# Patient Record
Sex: Female | Born: 1945 | Race: White | Hispanic: No | Marital: Married | Smoking: Former smoker
Health system: Southern US, Community
[De-identification: ages and names within clinical notes are randomized; demographics above are authoritative.]

## PROBLEM LIST (undated history)

## (undated) DIAGNOSIS — F32A Depression, unspecified: Secondary | ICD-10-CM

## (undated) DIAGNOSIS — M8580 Other specified disorders of bone density and structure, unspecified site: Secondary | ICD-10-CM

## (undated) DIAGNOSIS — K259 Gastric ulcer, unspecified as acute or chronic, without hemorrhage or perforation: Secondary | ICD-10-CM

## (undated) DIAGNOSIS — C44719 Basal cell carcinoma of skin of left lower limb, including hip: Secondary | ICD-10-CM

## (undated) DIAGNOSIS — K2270 Barrett's esophagus without dysplasia: Secondary | ICD-10-CM

## (undated) DIAGNOSIS — Z860100 Personal history of colon polyps, unspecified: Secondary | ICD-10-CM

## (undated) DIAGNOSIS — C4491 Basal cell carcinoma of skin, unspecified: Secondary | ICD-10-CM

## (undated) DIAGNOSIS — K219 Gastro-esophageal reflux disease without esophagitis: Secondary | ICD-10-CM

## (undated) DIAGNOSIS — C50919 Malignant neoplasm of unspecified site of unspecified female breast: Secondary | ICD-10-CM

## (undated) DIAGNOSIS — E785 Hyperlipidemia, unspecified: Secondary | ICD-10-CM

## (undated) DIAGNOSIS — J449 Chronic obstructive pulmonary disease, unspecified: Secondary | ICD-10-CM

## (undated) HISTORY — PX: EYE SURGERY: SHX253

## (undated) HISTORY — DX: Malignant neoplasm of unspecified site of unspecified female breast: C50.919

## (undated) HISTORY — PX: APPENDECTOMY: SHX54

## (undated) HISTORY — DX: Personal history of colon polyps, unspecified: Z86.0100

## (undated) HISTORY — DX: Gastric ulcer, unspecified as acute or chronic, without hemorrhage or perforation: K25.9

## (undated) HISTORY — DX: Barrett's esophagus without dysplasia: K22.70

## (undated) HISTORY — DX: Basal cell carcinoma of skin, unspecified: C44.91

## (undated) HISTORY — DX: Personal history of colonic polyps: Z86.010

## (undated) HISTORY — PX: CATARACT EXTRACTION, BILATERAL: SHX1313

## (undated) HISTORY — DX: Chronic obstructive pulmonary disease, unspecified: J44.9

## (undated) HISTORY — DX: Hyperlipidemia, unspecified: E78.5

## (undated) HISTORY — DX: Depression, unspecified: F32.A

## (undated) HISTORY — DX: Basal cell carcinoma of skin of left lower limb, including hip: C44.719

## (undated) HISTORY — DX: Gastro-esophageal reflux disease without esophagitis: K21.9

## (undated) HISTORY — PX: TONSILLECTOMY: SUR1361

## (undated) HISTORY — DX: Other specified disorders of bone density and structure, unspecified site: M85.80

---

## 1989-11-21 HISTORY — PX: ABDOMINAL HYSTERECTOMY: SHX81

## 1998-07-02 ENCOUNTER — Other Ambulatory Visit: Admission: RE | Admit: 1998-07-02 | Discharge: 1998-07-02 | Payer: Self-pay | Admitting: Obstetrics & Gynecology

## 1998-07-30 ENCOUNTER — Ambulatory Visit (HOSPITAL_COMMUNITY): Admission: RE | Admit: 1998-07-30 | Discharge: 1998-07-30 | Payer: Self-pay | Admitting: Obstetrics & Gynecology

## 1999-05-11 ENCOUNTER — Encounter (INDEPENDENT_AMBULATORY_CARE_PROVIDER_SITE_OTHER): Payer: Self-pay | Admitting: Specialist

## 1999-05-11 ENCOUNTER — Other Ambulatory Visit: Admission: RE | Admit: 1999-05-11 | Discharge: 1999-05-11 | Payer: Self-pay | Admitting: Obstetrics and Gynecology

## 1999-08-09 ENCOUNTER — Other Ambulatory Visit: Admission: RE | Admit: 1999-08-09 | Discharge: 1999-08-09 | Payer: Self-pay | Admitting: Obstetrics & Gynecology

## 2000-12-11 ENCOUNTER — Other Ambulatory Visit: Admission: RE | Admit: 2000-12-11 | Discharge: 2000-12-11 | Payer: Self-pay | Admitting: *Deleted

## 2001-05-02 ENCOUNTER — Encounter: Admission: RE | Admit: 2001-05-02 | Discharge: 2001-05-02 | Payer: Self-pay | Admitting: *Deleted

## 2001-05-02 ENCOUNTER — Encounter: Payer: Self-pay | Admitting: *Deleted

## 2001-05-18 ENCOUNTER — Encounter (INDEPENDENT_AMBULATORY_CARE_PROVIDER_SITE_OTHER): Payer: Self-pay | Admitting: Specialist

## 2001-05-18 ENCOUNTER — Ambulatory Visit (HOSPITAL_COMMUNITY): Admission: RE | Admit: 2001-05-18 | Discharge: 2001-05-18 | Payer: Self-pay | Admitting: *Deleted

## 2002-01-04 ENCOUNTER — Other Ambulatory Visit: Admission: RE | Admit: 2002-01-04 | Discharge: 2002-01-04 | Payer: Self-pay | Admitting: *Deleted

## 2004-01-28 ENCOUNTER — Other Ambulatory Visit: Admission: RE | Admit: 2004-01-28 | Discharge: 2004-01-28 | Payer: Self-pay | Admitting: Family Medicine

## 2006-07-22 HISTORY — PX: BREAST LUMPECTOMY: SHX2

## 2006-08-23 ENCOUNTER — Ambulatory Visit (HOSPITAL_BASED_OUTPATIENT_CLINIC_OR_DEPARTMENT_OTHER): Admission: RE | Admit: 2006-08-23 | Discharge: 2006-08-23 | Payer: Self-pay | Admitting: General Surgery

## 2006-08-23 ENCOUNTER — Encounter (INDEPENDENT_AMBULATORY_CARE_PROVIDER_SITE_OTHER): Payer: Self-pay | Admitting: *Deleted

## 2006-09-06 ENCOUNTER — Ambulatory Visit: Payer: Self-pay | Admitting: Oncology

## 2006-09-12 ENCOUNTER — Ambulatory Visit: Admission: RE | Admit: 2006-09-12 | Discharge: 2006-12-08 | Payer: Self-pay | Admitting: *Deleted

## 2006-10-03 LAB — COMPREHENSIVE METABOLIC PANEL
ALT: 14 U/L (ref 0–35)
AST: 18 U/L (ref 0–37)
BUN: 17 mg/dL (ref 6–23)
Calcium: 9 mg/dL (ref 8.4–10.5)
Chloride: 108 mEq/L (ref 96–112)
Creatinine, Ser: 0.87 mg/dL (ref 0.40–1.20)
Total Bilirubin: 0.3 mg/dL (ref 0.3–1.2)

## 2006-10-03 LAB — CBC WITH DIFFERENTIAL/PLATELET
BASO%: 1.1 % (ref 0.0–2.0)
Basophils Absolute: 0.1 10*3/uL (ref 0.0–0.1)
EOS%: 4.2 % (ref 0.0–7.0)
HCT: 37.1 % (ref 34.8–46.6)
HGB: 12.8 g/dL (ref 11.6–15.9)
LYMPH%: 31.7 % (ref 14.0–48.0)
MCH: 32 pg (ref 26.0–34.0)
MCHC: 34.5 g/dL (ref 32.0–36.0)
MCV: 92.9 fL (ref 81.0–101.0)
NEUT%: 56.7 % (ref 39.6–76.8)
Platelets: 235 10*3/uL (ref 145–400)

## 2007-01-01 ENCOUNTER — Ambulatory Visit: Payer: Self-pay | Admitting: Oncology

## 2007-01-25 ENCOUNTER — Ambulatory Visit: Payer: Self-pay | Admitting: Family Medicine

## 2007-01-25 LAB — CONVERTED CEMR LAB
ALT: 27 units/L (ref 0–40)
AST: 31 units/L (ref 0–37)
Albumin: 3.8 g/dL (ref 3.5–5.2)
Alkaline Phosphatase: 45 units/L (ref 39–117)
BUN: 12 mg/dL (ref 6–23)
Basophils Absolute: 0 10*3/uL (ref 0.0–0.1)
Basophils Relative: 0 % (ref 0.0–1.0)
Bilirubin, Direct: 0.1 mg/dL (ref 0.0–0.3)
CO2: 29 meq/L (ref 19–32)
Calcium: 9.4 mg/dL (ref 8.4–10.5)
Chloride: 103 meq/L (ref 96–112)
Cholesterol: 256 mg/dL (ref 0–200)
Creatinine, Ser: 0.8 mg/dL (ref 0.4–1.2)
Direct LDL: 175.3 mg/dL
Eosinophils Absolute: 0.7 10*3/uL — ABNORMAL HIGH (ref 0.0–0.6)
Eosinophils Relative: 7.2 % — ABNORMAL HIGH (ref 0.0–5.0)
GFR calc Af Amer: 94 mL/min
GFR calc non Af Amer: 78 mL/min
Glucose, Bld: 79 mg/dL (ref 70–99)
HCT: 41.5 % (ref 36.0–46.0)
HDL: 55.7 mg/dL (ref >39.0)
Hemoglobin: 14 g/dL (ref 12.0–15.0)
Lymphocytes Relative: 15.8 % (ref 12.0–46.0)
MCHC: 33.9 g/dL (ref 30.0–36.0)
MCV: 94.4 fL (ref 78.0–100.0)
Monocytes Absolute: 0.1 10*3/uL — ABNORMAL LOW (ref 0.2–0.7)
Monocytes Relative: 1.6 % — ABNORMAL LOW (ref 3.0–11.0)
Neutro Abs: 6.9 10*3/uL (ref 1.4–7.7)
Neutrophils Relative %: 75.4 % (ref 43.0–77.0)
Platelets: 199 10*3/uL (ref 150–400)
Potassium: 4.2 meq/L (ref 3.5–5.1)
RBC: 4.39 M/uL (ref 3.87–5.11)
RDW: 12.2 % (ref 11.5–14.6)
Sodium: 140 meq/L (ref 135–145)
TSH: 0.77 microintl units/mL (ref 0.35–5.50)
Total Bilirubin: 0.8 mg/dL (ref 0.3–1.2)
Total CHOL/HDL Ratio: 4.6
Total Protein: 7.2 g/dL (ref 6.0–8.3)
Triglycerides: 74 mg/dL (ref 0–149)
VLDL: 15 mg/dL (ref 0–40)
WBC: 9.1 10*3/uL (ref 4.5–10.5)

## 2007-02-01 ENCOUNTER — Encounter: Admission: RE | Admit: 2007-02-01 | Discharge: 2007-02-01 | Payer: Self-pay | Admitting: Family Medicine

## 2007-02-13 ENCOUNTER — Ambulatory Visit: Payer: Self-pay | Admitting: Family Medicine

## 2007-07-11 ENCOUNTER — Ambulatory Visit: Payer: Self-pay | Admitting: Family Medicine

## 2007-07-11 DIAGNOSIS — M899 Disorder of bone, unspecified: Secondary | ICD-10-CM | POA: Insufficient documentation

## 2007-07-11 DIAGNOSIS — E785 Hyperlipidemia, unspecified: Secondary | ICD-10-CM

## 2007-07-11 DIAGNOSIS — M949 Disorder of cartilage, unspecified: Secondary | ICD-10-CM

## 2007-07-11 DIAGNOSIS — Z853 Personal history of malignant neoplasm of breast: Secondary | ICD-10-CM

## 2007-07-11 DIAGNOSIS — F329 Major depressive disorder, single episode, unspecified: Secondary | ICD-10-CM | POA: Insufficient documentation

## 2007-07-20 LAB — CONVERTED CEMR LAB
ALT: 21 units/L (ref 0–35)
AST: 28 units/L (ref 0–37)
Albumin: 4 g/dL (ref 3.5–5.2)
Alkaline Phosphatase: 50 units/L (ref 39–117)
Bilirubin, Direct: 0.1 mg/dL (ref 0.0–0.3)
Cholesterol: 189 mg/dL (ref 0–200)
HDL: 49.4 mg/dL (ref >39.0)
LDL Cholesterol: 124 mg/dL — ABNORMAL HIGH (ref 0–99)
Total Bilirubin: 1 mg/dL (ref 0.3–1.2)
Total CHOL/HDL Ratio: 3.8
Total Protein: 7.4 g/dL (ref 6.0–8.3)
Triglycerides: 80 mg/dL (ref 0–149)
VLDL: 16 mg/dL (ref 0–40)

## 2007-08-06 ENCOUNTER — Encounter: Payer: Self-pay | Admitting: Family Medicine

## 2007-08-21 ENCOUNTER — Ambulatory Visit: Payer: Self-pay | Admitting: Internal Medicine

## 2007-09-03 ENCOUNTER — Ambulatory Visit: Payer: Self-pay | Admitting: Internal Medicine

## 2007-09-03 ENCOUNTER — Encounter: Payer: Self-pay | Admitting: Family Medicine

## 2007-09-03 ENCOUNTER — Encounter: Payer: Self-pay | Admitting: Internal Medicine

## 2008-09-01 ENCOUNTER — Telehealth (INDEPENDENT_AMBULATORY_CARE_PROVIDER_SITE_OTHER): Payer: Self-pay | Admitting: *Deleted

## 2008-09-03 ENCOUNTER — Encounter: Payer: Self-pay | Admitting: Family Medicine

## 2008-10-14 ENCOUNTER — Telehealth (INDEPENDENT_AMBULATORY_CARE_PROVIDER_SITE_OTHER): Payer: Self-pay | Admitting: *Deleted

## 2009-04-02 ENCOUNTER — Ambulatory Visit: Payer: Self-pay | Admitting: Family Medicine

## 2009-04-03 ENCOUNTER — Encounter (INDEPENDENT_AMBULATORY_CARE_PROVIDER_SITE_OTHER): Payer: Self-pay | Admitting: *Deleted

## 2009-04-03 LAB — CONVERTED CEMR LAB: Vit D, 25-Hydroxy: 41 ng/mL (ref 30–89)

## 2009-04-13 ENCOUNTER — Telehealth (INDEPENDENT_AMBULATORY_CARE_PROVIDER_SITE_OTHER): Payer: Self-pay | Admitting: *Deleted

## 2009-04-13 ENCOUNTER — Other Ambulatory Visit: Admission: RE | Admit: 2009-04-13 | Discharge: 2009-04-13 | Payer: Self-pay | Admitting: Family Medicine

## 2009-04-13 ENCOUNTER — Encounter: Payer: Self-pay | Admitting: Family Medicine

## 2009-04-13 ENCOUNTER — Ambulatory Visit: Payer: Self-pay | Admitting: Family Medicine

## 2009-04-13 DIAGNOSIS — N950 Postmenopausal bleeding: Secondary | ICD-10-CM

## 2009-04-13 LAB — CONVERTED CEMR LAB
ALT: 21 units/L (ref 0–35)
AST: 28 units/L (ref 0–37)
Albumin: 4 g/dL (ref 3.5–5.2)
Alkaline Phosphatase: 49 units/L (ref 39–117)
BUN: 15 mg/dL (ref 6–23)
Bilirubin Urine: NEGATIVE
Bilirubin, Direct: 0.1 mg/dL (ref 0.0–0.3)
Blood in Urine, dipstick: NEGATIVE
CO2: 30 meq/L (ref 19–32)
Calcium: 9.8 mg/dL (ref 8.4–10.5)
Chloride: 108 meq/L (ref 96–112)
Cholesterol: 261 mg/dL — ABNORMAL HIGH (ref 0–200)
Creatinine, Ser: 0.9 mg/dL (ref 0.4–1.2)
Direct LDL: 186.4 mg/dL
GFR calc non Af Amer: 67.42 mL/min (ref >60)
Glucose, Bld: 101 mg/dL — ABNORMAL HIGH (ref 70–99)
Glucose, Urine, Semiquant: NEGATIVE
HDL: 49.3 mg/dL (ref >39.00)
Ketones, urine, test strip: NEGATIVE
Nitrite: NEGATIVE
Potassium: 4.2 meq/L (ref 3.5–5.1)
Protein, U semiquant: NEGATIVE
Sodium: 142 meq/L (ref 135–145)
Specific Gravity, Urine: 1.02
Total Bilirubin: 0.6 mg/dL (ref 0.3–1.2)
Total CHOL/HDL Ratio: 5
Total Protein: 7.3 g/dL (ref 6.0–8.3)
Triglycerides: 98 mg/dL (ref 0.0–149.0)
Urobilinogen, UA: NEGATIVE
VLDL: 19.6 mg/dL (ref 0.0–40.0)
WBC Urine, dipstick: NEGATIVE
pH: 6.5

## 2009-04-15 ENCOUNTER — Encounter: Payer: Self-pay | Admitting: Family Medicine

## 2009-04-16 ENCOUNTER — Encounter (INDEPENDENT_AMBULATORY_CARE_PROVIDER_SITE_OTHER): Payer: Self-pay | Admitting: *Deleted

## 2009-07-07 ENCOUNTER — Ambulatory Visit: Payer: Self-pay | Admitting: Family Medicine

## 2009-07-07 DIAGNOSIS — J019 Acute sinusitis, unspecified: Secondary | ICD-10-CM

## 2009-07-07 LAB — CONVERTED CEMR LAB
ALT: 22 units/L (ref 0–35)
Bilirubin, Direct: 0 mg/dL (ref 0.0–0.3)
Calcium: 9.1 mg/dL (ref 8.4–10.5)
Chloride: 108 meq/L (ref 96–112)
Cholesterol: 203 mg/dL — ABNORMAL HIGH (ref 0–200)
Creatinine, Ser: 0.9 mg/dL (ref 0.4–1.2)
Direct LDL: 135.4 mg/dL
GFR calc non Af Amer: 67.36 mL/min (ref >60)
HDL: 52.7 mg/dL (ref >39.00)
Total Bilirubin: 0.9 mg/dL (ref 0.3–1.2)
Total CHOL/HDL Ratio: 4
VLDL: 10.8 mg/dL (ref 0.0–40.0)

## 2009-07-09 ENCOUNTER — Telehealth (INDEPENDENT_AMBULATORY_CARE_PROVIDER_SITE_OTHER): Payer: Self-pay | Admitting: *Deleted

## 2009-08-21 ENCOUNTER — Telehealth: Payer: Self-pay | Admitting: Family Medicine

## 2009-08-25 ENCOUNTER — Encounter: Payer: Self-pay | Admitting: Family Medicine

## 2009-09-07 ENCOUNTER — Encounter: Payer: Self-pay | Admitting: Family Medicine

## 2010-03-25 ENCOUNTER — Ambulatory Visit: Payer: Self-pay | Admitting: Family Medicine

## 2010-03-26 ENCOUNTER — Telehealth (INDEPENDENT_AMBULATORY_CARE_PROVIDER_SITE_OTHER): Payer: Self-pay | Admitting: *Deleted

## 2010-03-26 ENCOUNTER — Ambulatory Visit: Payer: Self-pay | Admitting: Family Medicine

## 2010-08-11 ENCOUNTER — Encounter (INDEPENDENT_AMBULATORY_CARE_PROVIDER_SITE_OTHER): Payer: Self-pay | Admitting: *Deleted

## 2010-08-26 ENCOUNTER — Encounter (INDEPENDENT_AMBULATORY_CARE_PROVIDER_SITE_OTHER): Payer: Self-pay | Admitting: *Deleted

## 2010-08-26 ENCOUNTER — Ambulatory Visit: Payer: Self-pay | Admitting: Family Medicine

## 2010-08-26 ENCOUNTER — Encounter: Payer: Self-pay | Admitting: Family Medicine

## 2010-08-26 DIAGNOSIS — Z860100 Personal history of colon polyps, unspecified: Secondary | ICD-10-CM | POA: Insufficient documentation

## 2010-08-26 DIAGNOSIS — Z780 Asymptomatic menopausal state: Secondary | ICD-10-CM | POA: Insufficient documentation

## 2010-08-26 DIAGNOSIS — Z87891 Personal history of nicotine dependence: Secondary | ICD-10-CM | POA: Insufficient documentation

## 2010-08-26 DIAGNOSIS — R05 Cough: Secondary | ICD-10-CM | POA: Insufficient documentation

## 2010-08-27 ENCOUNTER — Encounter: Payer: Self-pay | Admitting: Family Medicine

## 2010-08-27 ENCOUNTER — Ambulatory Visit: Payer: Self-pay | Admitting: Family Medicine

## 2010-09-08 ENCOUNTER — Encounter: Payer: Self-pay | Admitting: Family Medicine

## 2010-09-21 ENCOUNTER — Telehealth (INDEPENDENT_AMBULATORY_CARE_PROVIDER_SITE_OTHER): Payer: Self-pay | Admitting: *Deleted

## 2010-09-22 ENCOUNTER — Ambulatory Visit: Payer: Self-pay | Admitting: Family Medicine

## 2010-11-18 ENCOUNTER — Encounter: Payer: Self-pay | Admitting: Family Medicine

## 2010-12-19 LAB — CONVERTED CEMR LAB
Basophils Absolute: 0 10*3/uL (ref 0.0–0.1)
Bilirubin Urine: NEGATIVE
Blood in Urine, dipstick: NEGATIVE
Eosinophils Relative: 4.5 % (ref 0.0–5.0)
Glucose, Urine, Semiquant: NEGATIVE
HCT: 41.4 % (ref 36.0–46.0)
Hemoglobin: 14.3 g/dL (ref 12.0–15.0)
Ketones, urine, test strip: NEGATIVE
Lymphocytes Relative: 33.1 % (ref 12.0–46.0)
Lymphs Abs: 2.1 10*3/uL (ref 0.7–4.0)
Monocytes Relative: 6.8 % (ref 3.0–12.0)
Neutro Abs: 3.5 10*3/uL (ref 1.4–7.7)
Protein, U semiquant: NEGATIVE
RBC: 4.43 M/uL (ref 3.87–5.11)
Specific Gravity, Urine: <1.005
WBC: 6.3 10*3/uL (ref 4.5–10.5)
pH: 6

## 2010-12-23 ENCOUNTER — Ambulatory Visit: Admit: 2010-12-23 | Payer: Self-pay | Admitting: Family Medicine

## 2010-12-23 ENCOUNTER — Other Ambulatory Visit (INDEPENDENT_AMBULATORY_CARE_PROVIDER_SITE_OTHER): Payer: BC Managed Care – PPO

## 2010-12-23 ENCOUNTER — Encounter (INDEPENDENT_AMBULATORY_CARE_PROVIDER_SITE_OTHER): Payer: Self-pay | Admitting: *Deleted

## 2010-12-23 DIAGNOSIS — E785 Hyperlipidemia, unspecified: Secondary | ICD-10-CM

## 2010-12-23 NOTE — Letter (Signed)
Summary: [facility]  [facility]   Imported By: Lanelle Bal 12/03/2010 08:53:41  _____________________________________________________________________  External Attachment:    Type:   Image     Comment:   External Document

## 2010-12-23 NOTE — Letter (Signed)
Summary: Pre Visit Letter Revised  [facility]  [address] [place], [place] [address]   Phone: [phone]  Fax: [phone]        08/26/2010 MRN: [id number] Samantha Miranda [name] [place], [place]  [address]             Procedure Date:  10-05-10   Welcome to the Gastroenterology Division at [facility].    You are scheduled to see a nurse for your pre-procedure visit on 09-21-10 at 2:30p.m. on the 3rd floor at [facility], [address]  We ask that you try to arrive at our office 15 minutes prior to your appointment time to allow for check-in.  Please take a minute to review the attached form.  If you answer "Yes" to one or more of the questions on the first page, we ask that you call the person listed at your earliest opportunity.  If you answer "No" to all of the questions, please complete the rest of the form and bring it to your appointment.    Your nurse visit will consist of discussing your medical and surgical history, your immediate family medical history, and your medications.   If you are unable to list all of your medications on the form, please bring the medication bottles to your appointment and we will list them.  We will need to be aware of both prescribed and over the counter drugs.  We will need to know exact dosage information as well.    Please be prepared to read and sign documents such as consent forms, a financial agreement, and acknowledgement forms.  If necessary, and with your consent, a friend or relative is welcome to sit-in on the nurse visit with you.  Please bring your insurance card so that we may make a copy of it.  If your insurance requires a referral to see a specialist, please bring your referral form from your primary care physician.  No co-pay is required for this nurse visit.     If you cannot keep your appointment, please call [phone] to cancel or reschedule prior to your appointment date.  This allows  [place] the opportunity to schedule an appointment for another patient in need of care.    Thank you for choosing [facility] for your medical needs.  We appreciate the opportunity to care for you.  Please visit [place] at our website  to learn more about our practice.  Sincerely, The Gastroenterology Division

## 2010-12-23 NOTE — Assessment & Plan Note (Signed)
Summary: DISCUSS NEW DX OF COPD/SMOKING CESSATION/KB   Vital Signs:  Patient profile:   65 year old female Menstrual status:  hysterectomy Weight:      119.8 pounds Temp:     98.3 degrees F oral Pulse rate:   68 / minute Pulse rhythm:   regular BP sitting:   126 / 70  (right arm) Cuff size:   regular  Vitals Entered By: Almeta Monas CMA Duncan Dull) (August 27, 2010 3:54 PM) CC: wants to discuss Dx of copd   History of Present Illness: Pt here f/u cxr.  She is doing better with dulera.  No new complaints.   Preventive Screening-Counseling & Management  Alcohol-Tobacco     Alcohol drinks/day: 0     Smoking Status: current     Smoking Cessation Counseling: YES     Smoke Cessation Stage: precontemplative     Packs/Day: 8 cigs per day     Year Started: 1973  Caffeine-Diet-Exercise     Caffeine use/day: 3     Does Patient Exercise: yes     Type of exercise: hoola hooping, weights     Exercise (avg: min/session): 30-60     Times/week: 6  Current Medications (verified): 1)  Zolpidem Tartrate 10 Mg  Tabs (Zolpidem Tartrate) .... Take One Tablet At Bedtime If Needed 2)  Prozac 20 Mg Caps (Fluoxetine Hcl) .Marland Kitchen.. 1 By Mouth Once Daily 3)  Dulera 100-5 Mcg/act Aero (Mometasone Furo-Formoterol Fum) .... 2 Puffs Two Times A Day  Allergies (verified): 1)  ! * Avelox  Past History:  Past medical, surgical, family and social histories (including risk factors) reviewed for relevance to current acute and chronic problems.  Past Medical History: Reviewed history from 07/11/2007 and no changes required. Breast cancer, hx of Hyperlipidemia Osteopenia  Past Surgical History: Reviewed history from 07/11/2007 and no changes required. Lumpectomy (9/07), R Hysterectomy (1991)  Family History: Reviewed history from 07/11/2007 and no changes required. Family History of Colon CA 1st degree relative <60 Family History Breast cancer 1st degree relative <50 Family History Lung cancer MGM-  DM M-- CHf  Social History: Reviewed history from 08/26/2010 and no changes required. Occupation:  probation  Married Current Smoker Alcohol use-no Drug use-no Regular exercise-yes  Review of Systems      See HPI  Physical Exam  General:  Well-developed,well-nourished,in no acute distress; alert,appropriate and cooperative throughout examination Lungs:  Normal respiratory effort, chest expands symmetrically. Lungs are clear to auscultation, no crackles or wheezes. Psych:  Cognition and judgment appear intact. Alert and cooperative with normal attention span and concentration. No apparent delusions, illusions, hallucinations   Impression & Recommendations:  Problem # 1:  TOBACCO USER (ICD-305.1)  Pt is ready to quit gave sample of patches she will consider lozenges as well  Encouraged smoking cessation and discussed different methods for smoking cessation.   Orders: Tobacco use cessation intermediate 3-10 minutes (99406) Spirometry w/Graph (94010)  Complete Medication List: 1)  Zolpidem Tartrate 10 Mg Tabs (Zolpidem tartrate) .... Take one tablet at bedtime if needed 2)  Prozac 20 Mg Caps (Fluoxetine hcl) .Marland Kitchen.. 1 by mouth once daily 3)  Dulera 100-5 Mcg/act Aero (Mometasone furo-formoterol fum) .... 2 puffs two times a day  Patient Instructions: 1)  Tobacco is very bad for your health and your loved ones ! You should stop smoking !  2)  Stop smoking tips: Choose a quit date. Cut down before the quit date. Decide what you will do as a substitute when you  feel the urge to smoke(gum, toothpick, exercise).

## 2010-12-23 NOTE — Assessment & Plan Note (Signed)
Summary: vomiiting//cough//lch   Vital Signs:  Patient profile:   65 year old female Height:      61 inches Weight:      116 pounds BMI:     22.00 Temp:     97.0 degrees F oral BP sitting:   124 / 70  (left arm)  Vitals Entered By: Doristine Devoid (Mar 25, 2010 3:47 PM) CC: Dry cough xweeks now today w/ NVD   History of Present Illness: 65 yo woman here today for dry cough for 'months'.  keeps pt awake at night.  but now pt 'violently throwing up'- started at 1pm.  developed diarrhea prior to leaving house for appt.  took Avelox this AM from script that she never took.  previously had diarrhea on Avelox.  pt now dry heaving.  no fevers.  pt unable to provide hx on cough due to distress.  Allergies (verified): 1)  ! * Avelox  Review of Systems      See HPI  Physical Exam  General:  pt lying in fetal position on exam table.  able to sit up and tolerate exam, once exam complete pt began gagging herself and dry heaving over a bucket.  no vomitus. Head:  Normocephalic and atraumatic without obvious abnormalities. No apparent alopecia or balding. Ears:  External ear exam shows no significant lesions or deformities.  Otoscopic examination reveals clear canals, tympanic membranes are intact bilaterally without bulging, retraction, inflammation or discharge. Hearing is grossly normal bilaterally. Nose:  External nasal examination shows no deformity or inflammation. Nasal mucosa are pink and moist without lesions or exudates. Mouth:  Oral mucosa and oropharynx without lesions or exudates.  Teeth in good repair. Lungs:  Normal respiratory effort, chest expands symmetrically. Lungs are clear to auscultation, no crackles or wheezes. Heart:  normal rate and no murmur.   Abdomen:  soft, NT/ND, + BS.  pt reports tenderness but able to tolerate deep palpation w/ stethoscope   Impression & Recommendations:  Problem # 1:  VOMITING (ICD-787.03) Assessment New pt's vomiting likely related to  ingestion of avelox.  pt making herself heave in exam room- sits quietly for exam and then violently wretching.  injxn of phenergan given in office and script provided.  reviewed supportive care and red flags that should prompt return.  Pt expresses understanding and is in agreement w/ this plan. Orders: Admin of Therapeutic Inj  intramuscular or subcutaneous (16109) Vit B12 1000 mcg (J3420)  Problem # 2:  COUGH (ICD-786.2) Assessment: New pt unable to provide hx of cough.  will get CXR when pt is feeling better. Orders: T-2 View CXR (71020TC)  Complete Medication List: 1)  Vytorin 10-20 Mg Tabs (Ezetimibe-simvastatin) .Marland Kitchen.. 1 by mouth once daily 2)  Zolpidem Tartrate 10 Mg Tabs (Zolpidem tartrate) .... Take one tablet at bedtime if needed 3)  Prozac 20 Mg Caps (Fluoxetine hcl) .Marland Kitchen.. 1 by mouth once daily 4)  Metrogel-vaginal 0.75 % Gel (Metronidazole) .... Use at bedtime for 5 days. 5)  Guaifenesin-codeine 100-10 Mg/83ml Syrp (Guaifenesin-codeine) .Marland Kitchen.. 1-2 tsp by mouth at bedtime as needed 6)  Veramyst 27.5 Mcg/spray Susp (Fluticasone furoate) .... 2 sprays each nostril once daily 7)  Promethazine Hcl 25 Mg Tabs (Promethazine hcl) .Marland Kitchen.. 1 tab by mouth q6 as needed for nausea  Patient Instructions: 1)  Go to [address] to get your chest xray once you are feeling better 2)  Use the Promethazine as needed for nausea/vomiting 3)  Drink plenty of fluids 4)  Immodium for the diarrhea 5)  Tyleno/Ibuprofen for pain/fever 6)  Hang in there!! Prescriptions: PROMETHAZINE HCL 25 MG  TABS (PROMETHAZINE HCL) 1 tab by mouth Q6 as needed for nausea  #20 x 0   Entered and Authorized by:   Neena Rhymes MD   Signed by:   Neena Rhymes MD on 03/25/2010   Method used:   Electronically to        [facility]  [address] [id number]* (retail)       [address]       [place], [place]  [address]       Ph: [phone] or [phone]       Fax: [phone]   RxID:    [id number]    Medication Administration  Injection # 1:    Medication: Vit B12 1000 mcg    Diagnosis: VOMITING (ICD-787.03)    Route: IM    Site: RUOQ gluteus    Exp Date: 07/23/2011    Lot #: [id number]    Mfr: [email or web address]     Given by: Doristine Devoid (Mar 25, 2010 4:34 PM)  Orders Added: 1)  T-2 View CXR [71020TC] 2)  Admin of Therapeutic Inj  intramuscular or subcutaneous [96372] 3)  Vit B12 1000 mcg [J3420] 4)  Est. Patient Level III [25956]   Appended Document: vomiiting//cough//lch     Clinical Lists Changes  Orders: Added new Service order of Promethazine up to 50mg  (L8756) - Signed Added new Service order of Admin of Therapeutic Inj  intramuscular or subcutaneous (43329) - Signed       Medication Administration  Injection # 1:    Medication: Promethazine up to 50mg     Diagnosis: VOMITING (ICD-787.03)    Route: IM    Site: RUOQ gluteus    Exp Date: 07/23/2011    Lot #: [id number]    Mfr: [email or web address]    Given by: Doristine Devoid (Mar 26, 2010 8:12 AM)  Orders Added: 1)  Promethazine up to 50mg  [J2550] 2)  Admin of Therapeutic Inj  intramuscular or subcutaneous [16606]

## 2010-12-23 NOTE — Assessment & Plan Note (Signed)
Summary: CPX/Fasting labs-scm   Vital Signs:  Patient profile:   65 year old female Menstrual status:  hysterectomy Height:      63.25 inches Weight:      119.8 pounds O2 Sat:      98 % on Room air Temp:     97.7 degrees F oral Pulse rate:   68 / minute Pulse rhythm:   regular BP sitting:   112 / 72  (right arm) Cuff size:   regular  Vitals Entered By: Almeta Monas CMA Duncan Dull) (August 26, 2010 10:21 AM)  O2 Flow:  Room air CC: cpx/fasting, c/o cough x39months gradually getting worst, pt is a smoker Is Patient Diabetic? No     Menstrual Status hysterectomy Last PAP Result NEGATIVE FOR INTRAEPITHELIAL LESIONS OR MALIGNANCY.   History of Present Illness: Pt here for cpe , no pap. Pt c/o cough since May---sometimes productive.  Pt states she has been on 2 different abx with no relief.  Pt is till smoking 8 cig a day.  Cough has gotten worse in the last few weeks.   Pt also c/o not being able to completely clean out bowls----bm qother day---used to be 2-3 timex a day.  She sometimes takes senna---she eats fruit every day. She is not drinking enough water.     Preventive Screening-Counseling & Management  Alcohol-Tobacco     Alcohol drinks/day: 0     Smoking Status: current     Smoking Cessation Counseling: yes     Smoke Cessation Stage: precontemplative     Packs/Day: 8 cigs per day     Year Started: 1973  Caffeine-Diet-Exercise     Caffeine use/day: 3     Does Patient Exercise: yes     Type of exercise: hoola hooping, weights     Exercise (avg: min/session): 30-60     Times/week: 6  Hep-HIV-STD-Contraception     Dental Visit-last 6 months yes     Dental Care Counseling: not indicated; dental care within six months     SBE monthly: no     SBE Education/Counseling: to perform regular SBE      Sexual History:  currently monogamous.        Drug Use:  no.    Current Medications (verified): 1)  Zolpidem Tartrate 10 Mg  Tabs (Zolpidem Tartrate) .... Take One Tablet  At Bedtime If Needed 2)  Prozac 20 Mg Caps (Fluoxetine Hcl) .Marland Kitchen.. 1 By Mouth Once Daily 3)  Dulera 100-5 Mcg/act Aero (Mometasone Furo-Formoterol Fum) .... 2 Puffs Two Times A Day  Allergies (verified): 1)  ! * Avelox  Past History:  Past Medical History: Last updated: 07/11/2007 Breast cancer, hx of Hyperlipidemia Osteopenia  Past Surgical History: Last updated: 07/11/2007 Lumpectomy (9/07), R Hysterectomy (1991)  Family History: Last updated: 07/11/2007 Family History of Colon CA 1st degree relative <60 Family History Breast cancer 1st degree relative <50 Family History Lung cancer MGM- DM M-- CHf  Social History: Last updated: 08/26/2010 Occupation:  probation  Married Current Smoker Alcohol use-no Drug use-no Regular exercise-yes  Risk Factors: Alcohol Use: 0 (08/26/2010) Caffeine Use: 3 (08/26/2010) Exercise: yes (08/26/2010)  Risk Factors: Smoking Status: current (08/26/2010) Packs/Day: 8 cigs per day (08/26/2010)  Family History: Reviewed history from 07/11/2007 and no changes required. Family History of Colon CA 1st degree relative <60 Family History Breast cancer 1st degree relative <50 Family History Lung cancer MGM- DM M-- CHf  Social History: Reviewed history and no changes required. Occupation:  probation  Married Current Smoker Alcohol use-no Drug use-no Regular exercise-yes Smoking Status:  current Packs/Day:  8 cigs per day Caffeine use/day:  3 Does Patient Exercise:  yes Dental Care w/in 6 mos.:  yes Sexual History:  currently monogamous Occupation:  employed Drug Use:  no  Review of Systems      See HPI General:  Denies chills, fatigue, fever, loss of appetite, malaise, sleep disorder, sweats, weakness, and weight loss. Eyes:  Denies blurring, discharge, double vision, eye irritation, eye pain, halos, itching, light sensitivity, red eye, vision loss-1 eye, and vision loss-both eyes; optho--q1y. ENT:  Denies decreased hearing,  difficulty swallowing, ear discharge, earache, hoarseness, nasal congestion, nosebleeds, postnasal drainage, ringing in ears, sinus pressure, and sore throat. CV:  Denies bluish discoloration of lips or nails, chest pain or discomfort, difficulty breathing at night, difficulty breathing while lying down, fainting, fatigue, leg cramps with exertion, lightheadness, near fainting, palpitations, shortness of breath with exertion, swelling of feet, swelling of hands, and weight gain. Resp:  Complains of cough; denies chest discomfort, chest pain with inspiration, coughing up blood, excessive snoring, hypersomnolence, morning headaches, pleuritic, shortness of breath, sputum productive, and wheezing. GI:  Complains of change in bowel habits; denies abdominal pain, bloody stools, constipation, dark tarry stools, diarrhea, excessive appetite, gas, hemorrhoids, indigestion, loss of appetite, nausea, vomiting, vomiting blood, and yellowish skin color. GU:  Denies abnormal vaginal bleeding, decreased libido, discharge, dysuria, genital sores, hematuria, incontinence, nocturia, urinary frequency, and urinary hesitancy. MS:  Denies joint pain, joint redness, joint swelling, loss of strength, low back pain, mid back pain, muscle aches, muscle , cramps, muscle weakness, stiffness, and thoracic pain. Derm:  Denies changes in color of skin, changes in nail beds, dryness, excessive perspiration, flushing, hair loss, insect bite(s), itching, lesion(s), poor wound healing, and rash; derm q41m. Neuro:  Denies brief paralysis, difficulty with concentration, disturbances in coordination, falling down, headaches, inability to speak, memory loss, numbness, poor balance, seizures, sensation of room spinning, tingling, tremors, visual disturbances, and weakness. Psych:  Denies alternate hallucination ( auditory/visual), anxiety, depression, easily angered, easily tearful, irritability, mental problems, panic attacks, sense of great  danger, suicidal thoughts/plans, thoughts of violence, unusual visions or sounds, and thoughts /plans of harming others. Endo:  Denies cold intolerance, excessive hunger, excessive thirst, excessive urination, heat intolerance, polyuria, and weight change. Heme:  Denies abnormal bruising, bleeding, enlarge lymph nodes, fevers, pallor, and skin discoloration.  Physical Exam  General:  Well-developed,well-nourished,in no acute distress; alert,appropriate and cooperative throughout examination Head:  Normocephalic and atraumatic without obvious abnormalities. No apparent alopecia or balding. Eyes:  pupils equal, pupils round, pupils reactive to light, and no injection.   Ears:  External ear exam shows no significant lesions or deformities.  Otoscopic examination reveals clear canals, tympanic membranes are intact bilaterally without bulging, retraction, inflammation or discharge. Hearing is grossly normal bilaterally. Nose:  External nasal examination shows no deformity or inflammation. Nasal mucosa are pink and moist without lesions or exudates. Mouth:  Oral mucosa and oropharynx without lesions or exudates.  Teeth in good repair. Neck:  No deformities, masses, or tenderness noted. Chest Wall:  No deformities, masses, or tenderness noted. Breasts:  No mass, nodules, thickening, tenderness, bulging, retraction, inflamation, nipple discharge or skin changes noted.   Lungs:  normal respiratory effort, R wheezes, and L wheezes.  -- cleared with neb Heart:  Normal rate and regular rhythm. S1 and S2 normal without gallop, murmur, click, rub or other extra sounds. Abdomen:  Bowel sounds positive,abdomen soft  and non-tender without masses, organomegaly or hernias noted. Msk:  normal ROM, no joint tenderness, no joint swelling, no joint warmth, no redness over joints, no joint deformities, no joint instability, and no crepitation.   Pulses:  R and L carotid,radial,femoral,dorsalis pedis and posterior tibial  pulses are full and equal bilaterally Extremities:  No clubbing, cyanosis, edema, or deformity noted with normal full range of motion of all joints.   Neurologic:  No cranial nerve deficits noted. Station and gait are normal. Plantar reflexes are down-going bilaterally. DTRs are symmetrical throughout. Sensory, motor and coordinative functions appear intact. Skin:  mult sk  Cervical Nodes:  No lymphadenopathy noted Axillary Nodes:  No palpable lymphadenopathy Psych:  Cognition and judgment appear intact. Alert and cooperative with normal attention span and concentration. No apparent delusions, illusions, hallucinations   Impression & Recommendations:  Problem # 1:  PREVENTIVE HEALTH CARE (ICD-V70.0)  Orders: Venipuncture (16109) TLB-CBC Platelet - w/Differential (85025-CBCD) TLB-TSH (Thyroid Stimulating Hormone) (84443-TSH) T-Vitamin D (25-Hydroxy) (60454-09811) T- * Misc. Laboratory test [id number]) Gastroenterology Referral (GI) UA Dipstick W/ Micro (manual) (29562)  Problem # 2:  TOBACCO USER (ICD-305.1)  Orders: T-2 View CXR (71020TC) Venipuncture (13086) TLB-CBC Platelet - w/Differential (85025-CBCD) TLB-TSH (Thyroid Stimulating Hormone) (84443-TSH) T-Vitamin D (25-Hydroxy) (57846-96295) T- * Misc. Laboratory test [phone]) Specimen Handling (24401) UA Dipstick W/ Micro (manual) (81000) Tobacco use cessation intermediate 3-10 minutes (02725) Nebulizer Tx [phone])  Encouraged smoking cessation and discussed different methods for smoking cessation.   Problem # 3:  COUGH (ICD-786.2) ? COPD Orders: T-2 View CXR (71020TC) Venipuncture (03474) TLB-CBC Platelet - w/Differential (85025-CBCD) TLB-TSH (Thyroid Stimulating Hormone) (84443-TSH) T-Vitamin D (25-Hydroxy) (25956-38756) T- * Misc. Laboratory test [phone]) Albuterol Sulfate Sol 1mg  unit dose (J1884) Nebulizer Tx (16606) Specimen Handling (99000) UA Dipstick W/ Micro (manual) (81000) Nebulizer Tx (30160)  Problem # 4:   POSTMENOPAUSAL STATUS (ICD-V49.81)  Problem # 5:  OSTEOPENIA (ICD-733.90)  Vit D:41 (04/02/2009)  Problem # 6:  HYPERLIPIDEMIA (ICD-272.4)  The following medications were removed from the medication list:    Vytorin 10-20 Mg Tabs (Ezetimibe-simvastatin) .Marland Kitchen... 1 by mouth once daily  Orders: Venipuncture (10932) TLB-CBC Platelet - w/Differential (85025-CBCD) TLB-TSH (Thyroid Stimulating Hormone) (84443-TSH) T-Vitamin D (25-Hydroxy) (35573-22025) T- * Misc. Laboratory test [phone])  Problem # 7:  BREAST CANCER, HX OF (ICD-V10.3)  Orders: Venipuncture (23762) TLB-CBC Platelet - w/Differential (85025-CBCD) TLB-TSH (Thyroid Stimulating Hormone) (84443-TSH) T-Vitamin D (25-Hydroxy) (83151-76160) T- * Misc. Laboratory test [phone])  Complete Medication List: 1)  Zolpidem Tartrate 10 Mg Tabs (Zolpidem tartrate) .... Take one tablet at bedtime if needed 2)  Prozac 20 Mg Caps (Fluoxetine hcl) .Marland Kitchen.. 1 by mouth once daily 3)  Dulera 100-5 Mcg/act Aero (Mometasone furo-formoterol fum) .... 2 puffs two times a day  Other Orders: Tdap => 71yrs IM (62694) Admin 1st Vaccine (85462)  Patient Instructions: 1)  try align for bowls  2)  use dulera 2 puffs two times a day \\par  3)  rto for pulmonary function tests Prescriptions: DULERA 100-5 MCG/ACT AERO (MOMETASONE FURO-FORMOTEROL FUM) 2 puffs two times a day  #1 x 5   Entered and Authorized by:   Loreen Freud DO   Signed by:   Loreen Freud DO on 08/26/2010   Method used:   Print then Give to Patient   RxID:   [id number]       Immunizations Administered:  Tetanus Vaccine:    Vaccine Type: Tdap    Site: right deltoid    Mfr: Merck    Dose:  0.5 ml    Route: IM    Given by: Almeta Monas CMA (AAMA)    Exp. Date: 09/09/2012    Lot #: [id number]    VIS given: 10/08/08 version given August 26, 2010.    Medication Administration  Medication # 1:    Medication: Albuterol Sulfate Sol 1mg  unit dose    Diagnosis: COUGH  (ICD-786.2)    Dose: 2.5MG Ronny Bacon    Route: inhaled    Exp Date: 01/20/2012    Lot #: [id number]    Mfr: NEPHRON    Patient tolerated medication without complications    Given by: Almeta Monas CMA Duncan Dull) (August 26, 2010 11:18 AM)  Orders Added: 1)  T-2 View CXR [71020TC] 2)  Venipuncture [56213] 3)  TLB-CBC Platelet - w/Differential [85025-CBCD] 4)  TLB-TSH (Thyroid Stimulating Hormone) [84443-TSH] 5)  T-Vitamin D (25-Hydroxy) [08657-84696] 6)  T- * Misc. Laboratory test [99999] 7)  Tdap => 49yrs IM [90715] 8)  Admin 1st Vaccine [90471] 9)  Albuterol Sulfate Sol 1mg  unit dose [J7613] 10)  Nebulizer Tx [94640] 11)  Gastroenterology Referral [GI] 12)  Specimen Handling [99000] 13)  UA Dipstick W/ Micro (manual) [81000] 14)  Tobacco use cessation intermediate 3-10 minutes [99406] 15)  Est. Patient 40-64 years [99396] 16)  Nebulizer Tx [94640]   Laboratory Results   Urine Tests   Date/Time Reported: August 26, 2010 11:55 AM   Routine Urinalysis   Color: yellow Appearance: Clear Glucose: negative   (Normal Range: Negative) Bilirubin: negative   (Normal Range: Negative) Ketone: negative   (Normal Range: Negative) Spec. Gravity: <1.005   (Normal Range: 1.003-1.035) Blood: negative   (Normal Range: Negative) pH: 6.0   (Normal Range: 5.0-8.0) Protein: negative   (Normal Range: Negative) Urobilinogen: negative   (Normal Range: 0-1) Nitrite: negative   (Normal Range: Negative) Leukocyte Esterace: negative   (Normal Range: Negative)    Comments: Floydene Flock  August 26, 2010 11:55 AM

## 2010-12-23 NOTE — Assessment & Plan Note (Signed)
Summary: review [facility]/cbs   Vital Signs:  Patient profile:   65 year old female Menstrual status:  hysterectomy Weight:      120.0 pounds Pulse rate:   68 / minute Pulse rhythm:   regular BP sitting:   110 / 76  (right arm) Cuff size:   regular  Vitals Entered By: Almeta Monas CMA Duncan Dull) (September 22, 2010 11:00 AM) CC: f/u to review [facility]   History of Present Illness: Pt here to review [facility] only.  Current Medications (verified): 1)  Zolpidem Tartrate 10 Mg  Tabs (Zolpidem Tartrate) .... Take One Tablet At Bedtime If Needed 2)  Prozac 20 Mg Caps (Fluoxetine Hcl) .Marland Kitchen.. 1 By Mouth Once Daily 3)  Dulera 100-5 Mcg/act Aero (Mometasone Furo-Formoterol Fum) .... 2 Puffs Two Times A Day  Allergies (verified): 1)  ! * Avelox  Past History:  Past Medical History: Last updated: 07/11/2007 Breast cancer, hx of Hyperlipidemia Osteopenia  Past Surgical History: Last updated: 07/11/2007 Lumpectomy (9/07), R Hysterectomy (1991)  Family History: Last updated: 07/11/2007 Family History of Colon CA 1st degree relative <60 Family History Breast cancer 1st degree relative <50 Family History Lung cancer MGM- DM M-- CHf  Social History: Last updated: 08/26/2010 Occupation:  probation  Married Current Smoker Alcohol use-no Drug use-no Regular exercise-yes  Risk Factors: Alcohol Use: 0 (08/27/2010) Caffeine Use: 3 (08/27/2010) Exercise: yes (08/27/2010)  Risk Factors: Smoking Status: current (08/27/2010) Packs/Day: 8 cigs per day (08/27/2010)  Family History: Reviewed history from 07/11/2007 and no changes required. Family History of Colon CA 1st degree relative <60 Family History Breast cancer 1st degree relative <50 Family History Lung cancer MGM- DM M-- CHf  Social History: Reviewed history from 08/26/2010 and no changes required. Occupation:  probation  Married Current Smoker Alcohol use-no Drug use-no Regular  exercise-yes  Review of Systems      See HPI  Physical Exam  General:  Well-developed,well-nourished,in no acute distress; alert,appropriate and cooperative throughout examination Psych:  Cognition and judgment appear intact. Alert and cooperative with normal attention span and concentration. No apparent delusions, illusions, hallucinations   Impression & Recommendations:  Problem # 1:  HYPERLIPIDEMIA (ICD-272.4) see [facility]---pt given book-- labs scanned in EMR Her updated medication list for this problem includes:    Vytorin 10-20 Mg Tabs (Ezetimibe-simvastatin) .Marland Kitchen... 1 by mouth by mouth at bedtime  Labs Reviewed: SGOT: 30 (07/07/2009)   SGPT: 22 (07/07/2009)   HDL:52.70 (07/07/2009), 49.30 (04/02/2009)  LDL:124 (07/11/2007), DEL (01/25/2007)  Chol:203 (07/07/2009), 261 (04/02/2009)  Trig:54.0 (07/07/2009), 98.0 (04/02/2009)  Complete Medication List: 1)  Zolpidem Tartrate 10 Mg Tabs (Zolpidem tartrate) .... Take one tablet at bedtime if needed 2)  Prozac 20 Mg Caps (Fluoxetine hcl) .Marland Kitchen.. 1 by mouth once daily 3)  Dulera 100-5 Mcg/act Aero (Mometasone furo-formoterol fum) .... 2 puffs two times a day 4)  Vytorin 10-20 Mg Tabs (Ezetimibe-simvastatin) .Marland Kitchen.. 1 by mouth by mouth at bedtime  Patient Instructions: 1)  repeat fasting labs 3 months---[facility]  272.4 Prescriptions: VYTORIN 10-20 MG TABS (EZETIMIBE-SIMVASTATIN) 1 by mouth by mouth at bedtime  #30 x 2   Entered and Authorized by:   Loreen Freud DO   Signed by:   Loreen Freud DO on 09/22/2010   Method used:   Print then Give to Patient   RxID:   [id number]

## 2010-12-23 NOTE — Letter (Signed)
Summary: Colonoscopy Letter  [facility]  [address] [place], [place] [address]   Phone: [phone]  Fax: [phone]      August 11, 2010 MRN: [id number]   Samantha Miranda [address] [place], [place]  [address]   Dear [name],   According to your medical record, it is time for you to schedule a Colonoscopy. The American Cancer Society recommends this procedure as a method to detect early colon cancer. Patients with a family history of colon cancer, or a personal history of colon polyps or inflammatory bowel disease are at increased risk.  This letter has beeen generated based on the recommendations made at the time of your procedure. If you feel that in your particular situation this may no longer apply, please contact our office.  Please call our office at [phone] to schedule this appointment or to update your records at your earliest convenience.  Thank you for cooperating with [place] to provide you with the very best care possible.   Sincerely,   Iva Boop, M.D.  [facility] [phone]

## 2010-12-23 NOTE — Progress Notes (Signed)
Summary: xray report  Phone Note Outgoing Call   Call placed by: Doristine Devoid,  Mar 26, 2010 2:48 PM Call placed to: Patient Summary of Call: no evidence of bronchitis or PNA, no abx needed.  if cough is still bothering pt next week she can make an appt to discuss  Follow-up for Phone Call        spoke w/ patient aware of xray results and try robitussin and delsym and schedule appt if no better.......Marland KitchenDoristine Devoid  Mar 26, 2010 2:53 PM

## 2010-12-23 NOTE — Progress Notes (Signed)
----   Converted from flag ---- ---- 09/21/2010 10:05 AM, Okey Regal Spring wrote: patient has appt 110211  ---- 09/20/2010 11:51 AM, Almeta Monas CMA (AAMA) wrote: Please schedule an OV to review labs  ---- 09/20/2010 11:36 AM, Loreen Freud DO wrote: [facility] back ------------------------------

## 2011-01-06 ENCOUNTER — Encounter: Payer: Self-pay | Admitting: Family Medicine

## 2011-01-06 ENCOUNTER — Telehealth (INDEPENDENT_AMBULATORY_CARE_PROVIDER_SITE_OTHER): Payer: Self-pay | Admitting: *Deleted

## 2011-01-12 NOTE — Progress Notes (Signed)
Summary: Results-  Phone Note Outgoing Call   Call placed by: Almeta Monas CMA Duncan Dull),  January 06, 2011 9:08 AM Call placed to: Patient Summary of Call: Per Dr.Lowne B[facility] were perfect and patient is to continue what she is doing as far as diet and exercise.Marland KitchenMarland KitchenNo changes in meds Initial call taken by: Almeta Monas CMA Duncan Dull),  January 06, 2011 9:09 AM  Follow-up for Phone Call        Discuss with patient .......Marland KitchenFelecia Deloach CMA  January 06, 2011 9:15 AM

## 2011-04-06 ENCOUNTER — Encounter: Payer: Self-pay | Admitting: Family Medicine

## 2011-04-08 NOTE — Assessment & Plan Note (Signed)
[facility]                        [facility] OFFICE NOTE   NAME:Samantha Miranda, Samantha Miranda                       MRN:          [id number]  DATE:01/25/2007                            DOB:          1946-09-05    The patient is a 65 year old white female who presents today to  establish to this practice and for physical exam. The patient has no  complaints. She states she stopped smoking for a few weeks with the  patch and also had tried Chantix, but it made her nauseous all of the  time. So, she is smoking now again and is complaining of a dry cough and  some insomnia. She just started Juice Plus for hot flashes and other  menopausal symptoms, and she is hoping that it will help with those  symptoms. The patient also states she also recently stopped Zetia when  she was diagnosed with breast cancer and is hoping that the Juice Plus  will help with her overall health.   PAST MEDICAL HISTORY:  Significant for:  1. Breast cancer. Her oncologist is Dr. Donnie Coffin. She was diagnosed with      breast September 2007.  2. Osteopenia.  3. She also has a history of basal cell carcinoma and has had Mohs      surgery a few times on her face. [facility].  4. Had a total abdominal hysterectomy in 1991.  5. She had a colonoscopy in 2002 with the diagnosis of polyps and was      told to repeat it in 5 years. A repeat was not done in 2007. The      patient cannot remember her gastroenterologist was.   FAMILY HISTORY:  Father died at age 86 of lung cancer. Maternal  grandfather died at 71 of colon cancer. Maternal grandmother diabetes.  Her mother was recently diagnosed with congestive heart failure, and she  has a sister who is also diagnosed with breast cancer.   Her last mammogram was December of 2007, Pap smear last in 2005, last  tetanus shot was in 2006. She had a flu shot in 2007. She smoked 6 to 8  cigarettes a day for the  last 30 years. 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   MEDICATIONS:  She is only taking over-the-counter medicine, Juice Plus+,  megavitamin, Bone Care Calcium, Health Hair Skin and Nails, Juice Plus+  Garden Blend and Antioxidant 2 ounces a day. She has no known drug  allergies.   PHYSICAL EXAMINATION:  She is 5 feet 3-1/2, weighs 118.6 pounds.  Temperature 98, pulse 76, respirations 20, blood pressure 130/78.  She is awake, alert and oriented in no acute distress.  HEENT:  Head normocephalic, atraumatic. Eyes:  Pupils are equal and  reactive to light. Tympanic membranes are intact. The oropharynx is  clear.  NECK:  Is supple. No JVD. No bruits.  HEART:  Positive S1 and S2. No murmurs are appreciated.  LUNGS:  Are clear bilaterally. No rales, rhonchi  or wheezing.  BREAST: no masses palpated, no dimpling, no axillary node, no nipple  discharge  ABDOMEN:  Soft and nontender. No organomegaly. No masses palpated.  EXTREMITIES:  She has good strength in all four extremities. No sensory  or motor deficit.  GENITOURINARY AND GYNECOLOGY EXAMS:  Are refused by the patient.  SKIN:  She does have multiple moles and keratosis and is followed by  dermatology.   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 The patient  states she does seen ophthalmologist yearly. She wears contacts and  dentist every six months.   ASSESSMENT AND PLAN:  1. Complete physical exam. No Pap smear was done. We will get records      from her previous physician. General health maintenance is up to      date per the patient, but we will review the records. We discussed      stopping smoking. The patient will try the Commit lozenge and will      check fasting labs today.  2. History of prostate cancer. Per oncology. Will need records to make      sure she follows up with that. Her last mammogram was in December.  3. Osteopenia. We will need the last  bone density. The patient cannot      remember when that was. She will continue her calcium for now.  4. Right bundle branch block. I question if it is old or new. The      patient is unaware. We will get a stress test.  5. History of increased cholesterol. She is off of medications at this      time. We will check labs. The patient is only taking Juice Plus+.  6. History of cough and tobacco use. We will get chest x-ray.     Lelon Perla, DO  Electronically Signed    Shawnie Dapper  DD: 01/26/2007  DT: 01/26/2007  Job #: [id number]

## 2011-04-08 NOTE — Op Note (Signed)
NAMEMELVENA, Samantha Miranda                ACCOUNT NO.:  [id number]   MEDICAL RECORD NO.:  [id number]          PATIENT TYPE:  AMB   LOCATION:  DSC                          FACILITY:  [facility]   PHYSICIAN:  Leonie Man, M.D.   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   PROCEDURE:  Lumpectomy following needle localization of the right breast  lesion.   SURGEON:  Leonie Man, M.D.   ASSISTANT:  OR nurse.   ANESTHESIA:  General.   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  The patient  comes to the operating room now for lumpectomy of this site following needle  localization of the area of abnormality.  She understands the risks and  potential benefits of surgery and she gives her consent to same.   DESCRIPTION OF PROCEDURE:  Following induction of satisfactory general  anesthesia with the patient positioned supinely, the right breast was  prepped and draped to be included in a sterile operative field.  The area of  calcifications has been bracketed previously by Dr. Yolanda Bonine with two  localizing needles.  I made an incision between the two localizing needles  raising flaps superiorly, inferiorly, medially and laterally around a wide  area of breast tissue.  This was carried down to the chest wall and  pectoralis major muscle and medially to the tips of the needle.  The entire  specimen was removed and forwarded for specimen mammography.   Mammography  shows that the calcifications come close to the anterior margin. As a  consequence, an ellipse of skin is taken out from the incision in order to  assure additional anterior margins.  Hemostasis was then obtained with  electrocautery.  Sponge and instrument counts were verified.  The breast  tissues were reapproximated with interrupted 3-0 Vicryl sutures.  The  subcutaneous tissues was closed with interrupted 3-0 Vicryl sutures and the  skin closed with running 5-0 Monocryl and then reinforced with Steri-Strips.  A sterile dressing was applied, the anesthetic reversed, and the patient  removed from the operating room to the recovery room in stable condition.  She tolerated the procedure well.      Leonie Man, M.D.  Electronically Signed    PB/MEDQ  D:  08/23/2006  T:  08/25/2006  Job:  [id number]

## 2011-06-30 ENCOUNTER — Other Ambulatory Visit: Payer: Self-pay | Admitting: Family Medicine

## 2011-09-15 ENCOUNTER — Encounter: Payer: Self-pay | Admitting: Family Medicine

## 2011-09-22 ENCOUNTER — Ambulatory Visit (INDEPENDENT_AMBULATORY_CARE_PROVIDER_SITE_OTHER): Payer: BC Managed Care – PPO | Admitting: Family Medicine

## 2011-09-22 ENCOUNTER — Encounter: Payer: Self-pay | Admitting: Family Medicine

## 2011-09-22 VITALS — BP 136/82 | HR 64 | Temp 97.9°F | Wt 122.8 lb

## 2011-09-22 DIAGNOSIS — Z23 Encounter for immunization: Secondary | ICD-10-CM

## 2011-09-22 DIAGNOSIS — M549 Dorsalgia, unspecified: Secondary | ICD-10-CM

## 2011-09-22 MED ORDER — CYCLOBENZAPRINE HCL 10 MG PO TABS: 10.0000 mg | ORAL_TABLET | Freq: Three times a day (TID) | ORAL | Status: DC | PRN

## 2011-09-22 MED ORDER — HYDROCODONE-ACETAMINOPHEN 5-500 MG PO TABS: 1.0000 | ORAL_TABLET | ORAL | Status: DC | PRN

## 2011-09-22 NOTE — Patient Instructions (Signed)
Back Pain, Adult Low back pain is very common. About 1 in 5 people have back pain.The cause of low back pain is rarely dangerous. The pain often gets better over time.About half of people with a sudden onset of back pain feel better in just 2 weeks. About 8 in 10 people feel better by 6 weeks.  CAUSES Some common causes of back pain include:  Strain of the muscles or ligaments supporting the spine.   Wear and tear (degeneration) of the spinal discs.   Arthritis.   Direct injury to the back.  DIAGNOSIS Most of the time, the direct cause of low back pain is not known.However, back pain can be treated effectively even when the exact cause of the pain is unknown.Answering your caregiver's questions about your overall health and symptoms is one of the most accurate ways to make sure the cause of your pain is not dangerous. If your caregiver needs more information, he or she may order lab work or imaging tests (X-rays or MRIs).However, even if imaging tests show changes in your back, this usually does not require surgery. HOME CARE INSTRUCTIONS For many people, back pain returns.Since low back pain is rarely dangerous, it is often a condition that people can learn to manageon their own.   Remain active. It is stressful on the back to sit or stand in one place. Do not sit, drive, or stand in one place for more than 30 minutes at a time. Take short walks on level surfaces as soon as pain allows.Try to increase the length of time you walk each day.   Do not stay in bed.Resting more than 1 or 2 days can delay your recovery.   Do not avoid exercise or work.Your body is made to move.It is not dangerous to be active, even though your back may hurt.Your back will likely heal faster if you return to being active before your pain is gone.   Pay attention to your body when you bend and lift. Many people have less discomfortwhen lifting if they bend their knees, keep the load close to their  bodies,and avoid twisting. Often, the most comfortable positions are those that put less stress on your recovering back.   Find a comfortable position to sleep. Use a firm mattress and lie on your side with your knees slightly bent. If you lie on your back, put a pillow under your knees.   Only take over-the-counter or prescription medicines as directed by your caregiver. Over-the-counter medicines to reduce pain and inflammation are often the most helpful.Your caregiver may prescribe muscle relaxant drugs.These medicines help dull your pain so you can more quickly return to your normal activities and healthy exercise.   Put ice on the injured area.   Put ice in a plastic bag.   Place a towel between your skin and the bag.   Leave the ice on for 15 to 20 minutes, 3 to 4 times a day for the first 2 to 3 days. After that, ice and heat may be alternated to reduce pain and spasms.   Ask your caregiver about trying back exercises and gentle massage. This may be of some benefit.   Avoid feeling anxious or stressed.Stress increases muscle tension and can worsen back pain.It is important to recognize when you are anxious or stressed and learn ways to manage it.Exercise is a great option.  SEEK MEDICAL CARE IF:  You have pain that is not relieved with rest or medicine.   You have   pain that does not improve in 1 week.   You have new symptoms.   You are generally not feeling well.  SEEK IMMEDIATE MEDICAL CARE IF:   You have pain that radiates from your back into your legs.   You develop new bowel or bladder control problems.   You have unusual weakness or numbness in your arms or legs.   You develop nausea or vomiting.   You develop abdominal pain.   You feel faint.  Document Released: 11/07/2005 Document Revised: 07/20/2011 Document Reviewed: 03/28/2011 [employer] Patient Information 2012 [employer]. 

## 2011-09-22 NOTE — Progress Notes (Signed)
  Subjective:    Samantha Miranda is a 65 y.o. female who presents for evaluation of low back pain. The patient has had recurrent self limited episodes of low back pain in the past. Symptoms have been present for 3 weeks and are unchanged.  Onset was related to / precipitated by a twisting movement. The pain is located in the across the lower back and radiates to the right thigh, left thigh. The pain is described as sharp and occurs all day. She rates her pain as severe. Symptoms are exacerbated by sitting. Symptoms are improved by narcotic pain medications. She has also tried exercise, heat, ice and rest which provided no symptom relief. She has no other symptoms associated with the back pain. The patient has no "red flag" history indicative of complicated back pain.  The following portions of the patient's history were reviewed and updated as appropriate: allergies, current medications, past family history, past medical history, past social history, past surgical history and problem list.  Review of Systems Pertinent items are noted in HPI.    Objective:   Inspection and palpation: inspection of back is normal, paraspinal tenderness noted low back, antalgic gait. Muscle tone and ROM exam: muscle spasm noted LS paraspinal, full range of motion with pain. Straight leg raise: negative at 90 degrees bilaterally. Neurological: normal DTRs, muscle strength and reflexes.    Assessment:    Nonspecific acute low back pain    Plan:    Natural history and expected course discussed. Questions answered. Agricultural engineer distributed. Proper lifting, bending technique discussed. Stretching exercises discussed. Short (2-4 day) period of relative rest recommended until acute symptoms improve. Ice to affected area as needed for local pain relief. Heat to affected area as needed for local pain relief. Muscle relaxants per medication orders.

## 2011-09-22 NOTE — Progress Notes (Signed)
Addended by: Arnette Norris on: 09/22/2011 12:16 PM   Modules accepted: Orders

## 2011-09-26 ENCOUNTER — Telehealth: Payer: Self-pay | Admitting: *Deleted

## 2011-09-26 MED ORDER — HYDROCODONE-ACETAMINOPHEN 7.5-750 MG PO TABS: 1.0000 | ORAL_TABLET | Freq: Four times a day (QID) | ORAL | Status: AC | PRN

## 2011-09-26 MED ORDER — CARISOPRODOL 350 MG PO TABS: 350.0000 mg | ORAL_TABLET | Freq: Four times a day (QID) | ORAL | Status: AC | PRN

## 2011-09-26 NOTE — Telephone Encounter (Signed)
Left message to call office, Rx sent to pharmacy detail message left.

## 2011-09-26 NOTE — Telephone Encounter (Signed)
Pt states that the Vicodin and flexeril are not helping with pain. Pt would like to know if something else stronger can be Rx for pain. Pt seen on 09-22-11 for back pain.Please advise

## 2011-09-26 NOTE — Telephone Encounter (Signed)
Stop both vicodin es  1 po q6 hours prn----#30   Soma 1 po qid prn  #30 Ov if no relief with that

## 2011-09-27 NOTE — Telephone Encounter (Signed)
Left message to call office

## 2011-09-29 NOTE — Telephone Encounter (Signed)
Pt states that she has D/C med and started new meds which seem to be helping.

## 2011-10-31 ENCOUNTER — Telehealth: Payer: Self-pay | Admitting: *Deleted

## 2011-10-31 NOTE — Telephone Encounter (Signed)
[facility] Triage Call Report Triage Record Num: [id number] Operator: Jeraldine Loots Patient Name: Samantha Miranda Call Date & Time: 10/29/2011 10:36:25AM Patient Phone: [phone] PCP: Lelon Perla Patient Gender: Female PCP Fax : [phone] Patient DOB: 02-Feb-1946 Practice Name: [facility] Reason for Call: Caller: [name]/Patient; PCP: Lelon Perla.; CB#: [phone]; Call regarding Urinary Pain and dribbling only. Had back pain last night. No fever. Sent to [facility] this am for evaluation. Protocol(s) Used: Urinary Symptoms - Female Recommended Outcome per Protocol: See Provider within 4 hours Reason for Outcome: No urination for 8 or more hours Care Advice:

## 2011-10-31 NOTE — Telephone Encounter (Signed)
Pt states that she brought some OTC med and symptoms have resolved.  Pt notes that she did not go to [facility]. Pt advise if symptoms return to call the office for OV.

## 2011-11-24 ENCOUNTER — Other Ambulatory Visit: Payer: Self-pay | Admitting: Family Medicine

## 2011-11-25 NOTE — Telephone Encounter (Signed)
Last seen 09/22/11 and filled 01/06/11 # 30.       KP

## 2011-12-06 ENCOUNTER — Encounter: Payer: Self-pay | Admitting: Internal Medicine

## 2011-12-06 ENCOUNTER — Ambulatory Visit (INDEPENDENT_AMBULATORY_CARE_PROVIDER_SITE_OTHER): Payer: BC Managed Care – PPO | Admitting: Internal Medicine

## 2011-12-06 VITALS — BP 118/72 | HR 77 | Temp 98.3°F | Resp 14 | Wt 122.1 lb

## 2011-12-06 DIAGNOSIS — L0390 Cellulitis, unspecified: Secondary | ICD-10-CM

## 2011-12-06 DIAGNOSIS — L0291 Cutaneous abscess, unspecified: Secondary | ICD-10-CM

## 2011-12-06 MED ORDER — HYDROCODONE-ACETAMINOPHEN 5-500 MG PO TABS: 1.0000 | ORAL_TABLET | Freq: Four times a day (QID) | ORAL | Status: AC | PRN

## 2011-12-06 MED ORDER — AMOXICILLIN-POT CLAVULANATE ER 1000-62.5 MG PO TB12: 2.0000 | ORAL_TABLET | Freq: Two times a day (BID) | ORAL | Status: DC

## 2011-12-06 NOTE — Patient Instructions (Signed)
Start antibiotics today, call if fever, redness getting worse or not improving in 48 hours Hydrocodone for pain, will cause drowsiness  Be sure cat is doing well and had the rabies vaccine   Cellulitis Cellulitis is an infection of the skin and the tissue beneath it. The area is typically red and tender. It is caused by germs (bacteria) (usually staph or strep) that enter the body through cuts or sores. Cellulitis most commonly occurs in the arms or lower legs.  HOME CARE INSTRUCTIONS   If you are given a prescription for medications which kill germs (antibiotics), take as directed until finished.   If the infection is on the arm or leg, keep the limb elevated as able.   Use a warm cloth several times per day to relieve pain and encourage healing.   See your caregiver for recheck of the infected site as directed if problems arise.   Only take over-the-counter or prescription medicines for pain, discomfort, or fever as directed by your caregiver.  SEEK MEDICAL CARE IF:   The area of redness (inflammation) is spreading, there are red streaks coming from the infected site, or if a part of the infection begins to turn dark in color.   The joint or bone underneath the infected skin becomes painful after the skin has healed.   The infection returns in the same or another area after it seems to have gone away.   A boil or bump swells up. This may be an abscess.   New, unexplained problems such as pain or fever develop.  SEEK IMMEDIATE MEDICAL CARE IF:   You have a fever.   You or your child feels drowsy or lethargic.   There is vomiting, diarrhea, or lasting discomfort or feeling ill (malaise) with muscle aches and pains.  MAKE SURE YOU:   Understand these instructions.   Will watch your condition.   Will get help right away if you are not doing well or get worse.  Document Released: 08/17/2005 Document Revised: 07/20/2011 Document Reviewed: 06/25/2008 [facility] Patient  Information 2012 [place], [place].

## 2011-12-06 NOTE — Progress Notes (Signed)
  Subjective:    Patient ID: Samantha Miranda, female    DOB: [birth year], 66 y.o.   MRN: [id number]  HPI Acute visit 3 days ago, her mother's cat bite her on the right hand, since then she started to develop swelling and redness along with pain. This is a house cat, the patient believes he has all his vaccinations  Past Medical History  Diagnosis Date  . Cancer     B[place] cancer  . Hyperlipidemia   . Osteopenia      Review of Systems No fever or chills No discharge Patient is updated on his tetanus shot    Objective:   Physical Exam  Constitutional: She appears well-developed and well-nourished. No distress.  Musculoskeletal:       Arms: Skin: She is not diaphoretic.          Assessment & Plan:  Cellulitis: Encouraged early reporting if she's not improving, encouraged to be sure the cat is vaccinated. See instructions

## 2011-12-07 ENCOUNTER — Telehealth: Payer: Self-pay | Admitting: Family Medicine

## 2011-12-07 MED ORDER — DOXYCYCLINE HYCLATE 100 MG PO TABS: 100.0000 mg | ORAL_TABLET | Freq: Two times a day (BID) | ORAL | Status: AC

## 2011-12-07 NOTE — Telephone Encounter (Signed)
The pt called in

## 2011-12-07 NOTE — Telephone Encounter (Signed)
Discuss with patient, Rx sent to pharmacy. 

## 2011-12-07 NOTE — Telephone Encounter (Signed)
The pt called and stated she has had an allergic reaction to the antibiotic she was prescribed yesterday.  She states she told Dr.Paz that she was allergic to the medicine, but then went ahead and took it.  She stated she has been throwing up blood.  She is requesting a different antibiotic be called in and a prescription to settle her stomach.   Thanks!!

## 2011-12-07 NOTE — Telephone Encounter (Signed)
1. If she is throwing up blood she needs to go to the ER 2. She is reportedly  allergic to Avelox, I prescribed augmentin, an unrelated antibiotic. 3.  Document what type of allergy she had to augmentin  ----> rash and itching? Was it nausea- vomiting? 4. She needs an antibiotic, call doxycycline 100 mg 1 by mouth twice a day, #20. No refills.

## 2011-12-08 ENCOUNTER — Telehealth: Payer: Self-pay

## 2011-12-08 ENCOUNTER — Encounter: Payer: Self-pay | Admitting: Family Medicine

## 2012-06-06 NOTE — Telephone Encounter (Signed)
Error

## 2012-07-13 ENCOUNTER — Other Ambulatory Visit: Payer: Self-pay | Admitting: Family Medicine

## 2012-07-24 ENCOUNTER — Encounter: Payer: Self-pay | Admitting: Internal Medicine

## 2012-08-12 ENCOUNTER — Encounter: Payer: Self-pay | Admitting: Family Medicine

## 2012-08-12 DIAGNOSIS — K219 Gastro-esophageal reflux disease without esophagitis: Secondary | ICD-10-CM | POA: Insufficient documentation

## 2012-08-12 DIAGNOSIS — K2270 Barrett's esophagus without dysplasia: Secondary | ICD-10-CM | POA: Insufficient documentation

## 2013-01-05 ENCOUNTER — Other Ambulatory Visit: Payer: Self-pay

## 2013-01-07 ENCOUNTER — Encounter: Payer: Self-pay | Admitting: Family Medicine

## 2013-02-05 ENCOUNTER — Encounter: Payer: Self-pay | Admitting: Lab

## 2013-02-06 ENCOUNTER — Encounter: Payer: Self-pay | Admitting: Family Medicine

## 2013-02-06 ENCOUNTER — Ambulatory Visit (INDEPENDENT_AMBULATORY_CARE_PROVIDER_SITE_OTHER): Payer: 59 | Admitting: Family Medicine

## 2013-02-06 VITALS — BP 120/78 | HR 80 | Temp 98.2°F | Ht 63.0 in | Wt 124.0 lb

## 2013-02-06 DIAGNOSIS — E785 Hyperlipidemia, unspecified: Secondary | ICD-10-CM

## 2013-02-06 DIAGNOSIS — Z0000 Encounter for general adult medical examination without abnormal findings: Secondary | ICD-10-CM

## 2013-02-06 DIAGNOSIS — Z860100 Personal history of colon polyps, unspecified: Secondary | ICD-10-CM

## 2013-02-06 LAB — CBC WITH DIFFERENTIAL/PLATELET
Basophils Absolute: 0.1 10*3/uL (ref 0.0–0.1)
Basophils Relative: 0.9 % (ref 0.0–3.0)
Eosinophils Absolute: 0.3 10*3/uL (ref 0.0–0.7)
Lymphocytes Relative: 23.4 % (ref 12.0–46.0)
MCHC: 33.9 g/dL (ref 30.0–36.0)
MCV: 91.6 fl (ref 78.0–100.0)
Monocytes Absolute: 0.4 10*3/uL (ref 0.1–1.0)
Neutro Abs: 4.8 10*3/uL (ref 1.4–7.7)
Neutrophils Relative %: 66.2 % (ref 43.0–77.0)
RBC: 4.48 Mil/uL (ref 3.87–5.11)
RDW: 13 % (ref 11.5–14.6)

## 2013-02-06 LAB — LIPID PANEL
HDL: 54 mg/dL (ref >39.00)
Triglycerides: 96 mg/dL (ref 0.0–149.0)
VLDL: 19.2 mg/dL (ref 0.0–40.0)

## 2013-02-06 LAB — BASIC METABOLIC PANEL
BUN: 15 mg/dL (ref 6–23)
CO2: 28 mEq/L (ref 19–32)
Calcium: 9.4 mg/dL (ref 8.4–10.5)
Chloride: 102 mEq/L (ref 96–112)
Creatinine, Ser: 0.8 mg/dL (ref 0.4–1.2)
Glucose, Bld: 82 mg/dL (ref 70–99)

## 2013-02-06 LAB — POCT URINALYSIS DIPSTICK
Bilirubin, UA: NEGATIVE
Blood, UA: NEGATIVE
Glucose, UA: NEGATIVE
Leukocytes, UA: NEGATIVE
Nitrite, UA: NEGATIVE

## 2013-02-06 LAB — HEPATIC FUNCTION PANEL
Albumin: 4.3 g/dL (ref 3.5–5.2)
Bilirubin, Direct: 0 mg/dL (ref 0.0–0.3)
Total Protein: 7.7 g/dL (ref 6.0–8.3)

## 2013-02-06 LAB — LDL CHOLESTEROL, DIRECT: Direct LDL: 175.5 mg/dL

## 2013-02-06 NOTE — Progress Notes (Signed)
Subjective:    Samantha Miranda is a 67 y.o. female who presents for a welcome to Medicare exam.   Cardiac risk factors: advanced age (older than 59 for men, 22 for women), dyslipidemia and smoking/ tobacco exposure.  Activities of Daily Living  In your present state of health, do you have any difficulty performing the following activities?:  Preparing food and eating?: No Bathing yourself: No Getting dressed: No Using the toilet:No Moving around from place to place: No In the past year have you fallen or had a near fall?:No  Current exercise habits: Gym/ health club routine includes cardio and low impact aerobics.   Dietary issues discussed: na   Depression Screen (Note: if answer to either of the following is "Yes", then a more complete depression screening is indicated)  Q1: Over the past two weeks, have you felt down, depressed or hopeless?no Q2: Over the past two weeks, have you felt little interest or pleasure in doing things? no   The following portions of the patient's history were reviewed and updated as appropriate:  She  has a past medical history of Cancer; Hyperlipidemia; and Osteopenia. She  does not have any pertinent problems on file. She  has past surgical history that includes Abdominal hysterectomy (1991) and Breast lumpectomy (9/07). Her family history includes Breast cancer in an unspecified family member; Colon cancer in an unspecified family member; Diabetes in her maternal grandmother; Heart failure in her mother; and Lung cancer in an unspecified family member. She  reports that she has quit smoking. She has never used smokeless tobacco. She reports that she does not drink alcohol or use illicit drugs. She has a current medication list which includes the following prescription(s): vitamin d, coenzyme q10, magnesium, OVER THE COUNTER MEDICATION, probiotic product, vytorin, and zolpidem. Current Outpatient Prescriptions on File Prior to Visit  Medication Sig  Dispense Refill  . Cholecalciferol (VITAMIN D) 2000 UNITS tablet Take 4,000 Units by mouth daily.      . Coenzyme Q10 (COQ10 PO) Take 1 each by mouth daily.      Marland Kitchen MAGNESIUM PO Take 1 each by mouth daily.      Marland Kitchen OVER THE COUNTER MEDICATION Take 4 tablets by mouth daily. Betaglucen.      Marland Kitchen VYTORIN 10-20 MG per tablet take 1 tablet by mouth at bedtime  30 tablet  2  . zolpidem (AMBIEN) 10 MG tablet take 1 tablet by mouth at bedtime if needed  30 tablet  0   No current facility-administered medications on file prior to visit.   She is allergic to amoxicillin-pot clavulanate and moxifloxacin.. Review of Systems  Review of Systems  Constitutional: Negative for activity change, appetite change and fatigue.  HENT: Negative for hearing loss, congestion, tinnitus and ear discharge.   Eyes: Negative for visual disturbance (see optho q1y -- vision corrected to 20/20 with glasses).  Respiratory: Negative for cough, chest tightness and shortness of breath.   Cardiovascular: Negative for chest pain, palpitations and leg swelling.  Gastrointestinal: Negative for abdominal pain, diarrhea, constipation and abdominal distention.  Genitourinary: Negative for urgency, frequency, decreased urine volume and difficulty urinating.  Musculoskeletal: Negative for back pain, arthralgias and gait problem.  Skin: Negative for color change, pallor and rash.  Neurological: Negative for dizziness, light-headedness, numbness and headaches.  Hematological: Negative for adenopathy. Does not bruise/bleed easily.  Psychiatric/Behavioral: Negative for suicidal ideas, confusion, sleep disturbance, self-injury, dysphoric mood, decreased concentration and agitation.  Pt is able to read and write and  can do all ADLs No risk for falling No abuse/ violence in home     Objective:     Vision by Snellen chart: opth Blood pressure 120/78, pulse 80, temperature 98.2 F (36.8 C), temperature source Oral, height 5\' 3"  (1.6 m),  weight 124 lb (56.246 kg), SpO2 96.00%. Body mass index is 21.97 kg/(m^2). BP 120/78  Pulse 80  Temp(Src) 98.2 F (36.8 C) (Oral)  Ht 5\' 3"  (1.6 m)  Wt 124 lb (56.246 kg)  BMI 21.97 kg/m2  SpO2 96% General appearance: alert, cooperative, appears stated age and no distress Head: Normocephalic, without obvious abnormality, atraumatic Eyes: conjunctivae/corneas clear. PERRL, EOM's intact. Fundi benign. Ears: normal TM's and external ear canals both ears Nose: Nares normal. Septum midline. Mucosa normal. No drainage or sinus tenderness. Throat: lips, mucosa, and tongue normal; teeth and gums normal Neck: no adenopathy, no carotid bruit, no JVD, supple, symmetrical, trachea midline and thyroid not enlarged, symmetric, no tenderness/mass/nodules Back: symmetric, no curvature. ROM normal. No CVA tenderness. Lungs: clear to auscultation bilaterally Breasts: normal appearance, no masses or tenderness Heart: regular rate and rhythm, S1, S2 normal, no murmur, click, rub or gallop Abdomen: soft, non-tender; bowel sounds normal; no masses,  no organomegaly Pelvic: not indicated; post-menopausal, no abnormal Pap smears in past Extremities: extremities normal, atraumatic, no cyanosis or edema Pulses: 2+ and symmetric Skin: Skin color, texture, turgor normal. No rashes or lesions Lymph nodes: Cervical, supraclavicular, and axillary nodes normal. Neurologic: Alert and oriented X 3, normal strength and tone. Normal symmetric reflexes. Normal coordination and gait Psych---no depression, no anxiety      Assessment:     cpe      Plan:     During the course of the visit the patient was educated and counseled about appropriate screening and preventive services including:   Influenza vaccine  Td vaccine  Screening mammography  Screening Pap smear and pelvic exam   Bone densitometry screening  Colorectal cancer screening  Advanced directives: has NO advanced directive - not interested in  additional information  Patient Instructions (the written plan) was given to the patient.

## 2013-02-06 NOTE — Patient Instructions (Addendum)
Preventive Care for Adults, Female A healthy lifestyle and preventive care can promote health and wellness. Preventive health guidelines for women include the following key practices.  A routine yearly physical is a good way to check with your caregiver about your health and preventive screening. It is a chance to share any concerns and updates on your health, and to receive a thorough exam.  Visit your dentist for a routine exam and preventive care every 6 months. Brush your teeth twice a day and floss once a day. Good oral hygiene prevents tooth decay and gum disease.  The frequency of eye exams is based on your age, health, family medical history, use of contact lenses, and other factors. Follow your caregiver's recommendations for frequency of eye exams.  Eat a healthy diet. Foods like vegetables, fruits, whole grains, low-fat dairy products, and lean protein foods contain the nutrients you need without too many calories. Decrease your intake of foods high in solid fats, added sugars, and salt. Eat the right amount of calories for you.Get information about a proper diet from your caregiver, if necessary.  Regular physical exercise is one of the most important things you can do for your health. Most adults should get at least 150 minutes of moderate-intensity exercise (any activity that increases your heart rate and causes you to sweat) each week. In addition, most adults need muscle-strengthening exercises on 2 or more days a week.  Maintain a healthy weight. The body mass index (BMI) is a screening tool to identify possible weight problems. It provides an estimate of body fat based on height and weight. Your caregiver can help determine your BMI, and can help you achieve or maintain a healthy weight.For adults 20 years and older:  A BMI below 18.5 is considered underweight.  A BMI of 18.5 to 24.9 is normal.  A BMI of 25 to 29.9 is considered overweight.  A BMI of 30 and above is  considered obese.  Maintain normal blood lipids and cholesterol levels by exercising and minimizing your intake of saturated fat. Eat a balanced diet with plenty of fruit and vegetables. Blood tests for lipids and cholesterol should begin at age 20 and be repeated every 5 years. If your lipid or cholesterol levels are high, you are over 50, or you are at high risk for heart disease, you may need your cholesterol levels checked more frequently.Ongoing high lipid and cholesterol levels should be treated with medicines if diet and exercise are not effective.  If you smoke, find out from your caregiver how to quit. If you do not use tobacco, do not start.  If you are pregnant, do not drink alcohol. If you are breastfeeding, be very cautious about drinking alcohol. If you are not pregnant and choose to drink alcohol, do not exceed 1 drink per day. One drink is considered to be 12 ounces (355 mL) of beer, 5 ounces (148 mL) of wine, or 1.5 ounces (44 mL) of liquor.  Avoid use of street drugs. Do not share needles with anyone. Ask for help if you need support or instructions about stopping the use of drugs.  High blood pressure causes heart disease and increases the risk of stroke. Your blood pressure should be checked at least every 1 to 2 years. Ongoing high blood pressure should be treated with medicines if weight loss and exercise are not effective.  If you are 55 to 66 years old, ask your caregiver if you should take aspirin to prevent strokes.  Diabetes   screening involves taking a blood sample to check your fasting blood sugar level. This should be done once every 3 years, after age 45, if you are within normal weight and without risk factors for diabetes. Testing should be considered at a younger age or be carried out more frequently if you are overweight and have at least 1 risk factor for diabetes.  Breast cancer screening is essential preventive care for women. You should practice "breast  self-awareness." This means understanding the normal appearance and feel of your breasts and may include breast self-examination. Any changes detected, no matter how small, should be reported to a caregiver. Women in their 20s and 30s should have a clinical breast exam (CBE) by a caregiver as part of a regular health exam every 1 to 3 years. After age 40, women should have a CBE every year. Starting at age 40, women should consider having a mammography (breast X-ray test) every year. Women who have a family history of breast cancer should talk to their caregiver about genetic screening. Women at a high risk of breast cancer should talk to their caregivers about having magnetic resonance imaging (MRI) and a mammography every year.  The Pap test is a screening test for cervical cancer. A Pap test can show cell changes on the cervix that might become cervical cancer if left untreated. A Pap test is a procedure in which cells are obtained and examined from the lower end of the uterus (cervix).  Women should have a Pap test starting at age 21.  Between ages 21 and 29, Pap tests should be repeated every 2 years.  Beginning at age 30, you should have a Pap test every 3 years as long as the past 3 Pap tests have been normal.  Some women have medical problems that increase the chance of getting cervical cancer. Talk to your caregiver about these problems. It is especially important to talk to your caregiver if a new problem develops soon after your last Pap test. In these cases, your caregiver may recommend more frequent screening and Pap tests.  The above recommendations are the same for women who have or have not gotten the vaccine for human papillomavirus (HPV).  If you had a hysterectomy for a problem that was not cancer or a condition that could lead to cancer, then you no longer need Pap tests. Even if you no longer need a Pap test, a regular exam is a good idea to make sure no other problems are  starting.  If you are between ages 65 and 70, and you have had normal Pap tests going back 10 years, you no longer need Pap tests. Even if you no longer need a Pap test, a regular exam is a good idea to make sure no other problems are starting.  If you have had past treatment for cervical cancer or a condition that could lead to cancer, you need Pap tests and screening for cancer for at least 20 years after your treatment.  If Pap tests have been discontinued, risk factors (such as a new sexual partner) need to be reassessed to determine if screening should be resumed.  The HPV test is an additional test that may be used for cervical cancer screening. The HPV test looks for the virus that can cause the cell changes on the cervix. The cells collected during the Pap test can be tested for HPV. The HPV test could be used to screen women aged 30 years and older, and should   be used in women of any age who have unclear Pap test results. After the age of 30, women should have HPV testing at the same frequency as a Pap test.  Colorectal cancer can be detected and often prevented. Most routine colorectal cancer screening begins at the age of 50 and continues through age 75. However, your caregiver may recommend screening at an earlier age if you have risk factors for colon cancer. On a yearly basis, your caregiver may provide home test kits to check for hidden blood in the stool. Use of a small camera at the end of a tube, to directly examine the colon (sigmoidoscopy or colonoscopy), can detect the earliest forms of colorectal cancer. Talk to your caregiver about this at age 50, when routine screening begins. Direct examination of the colon should be repeated every 5 to 10 years through age 75, unless early forms of pre-cancerous polyps or small growths are found.  Hepatitis C blood testing is recommended for all people born from 1945 through 1965 and any individual with known risks for hepatitis C.  Practice  safe sex. Use condoms and avoid high-risk sexual practices to reduce the spread of sexually transmitted infections (STIs). STIs include gonorrhea, chlamydia, syphilis, trichomonas, herpes, HPV, and human immunodeficiency virus (HIV). Herpes, HIV, and HPV are viral illnesses that have no cure. They can result in disability, cancer, and death. Sexually active women aged 25 and younger should be checked for chlamydia. Older women with new or multiple partners should also be tested for chlamydia. Testing for other STIs is recommended if you are sexually active and at increased risk.  Osteoporosis is a disease in which the bones lose minerals and strength with aging. This can result in serious bone fractures. The risk of osteoporosis can be identified using a bone density scan. Women ages 65 and over and women at risk for fractures or osteoporosis should discuss screening with their caregivers. Ask your caregiver whether you should take a calcium supplement or vitamin D to reduce the rate of osteoporosis.  Menopause can be associated with physical symptoms and risks. Hormone replacement therapy is available to decrease symptoms and risks. You should talk to your caregiver about whether hormone replacement therapy is right for you.  Use sunscreen with sun protection factor (SPF) of 30 or more. Apply sunscreen liberally and repeatedly throughout the day. You should seek shade when your shadow is shorter than you. Protect yourself by wearing long sleeves, pants, a wide-brimmed hat, and sunglasses year round, whenever you are outdoors.  Once a month, do a whole body skin exam, using a mirror to look at the skin on your back. Notify your caregiver of new moles, moles that have irregular borders, moles that are larger than a pencil eraser, or moles that have changed in shape or color.  Stay current with required immunizations.  Influenza. You need a dose every fall (or winter). The composition of the flu vaccine  changes each year, so being vaccinated once is not enough.  Pneumococcal polysaccharide. You need 1 to 2 doses if you smoke cigarettes or if you have certain chronic medical conditions. You need 1 dose at age 65 (or older) if you have never been vaccinated.  Tetanus, diphtheria, pertussis (Tdap, Td). Get 1 dose of Tdap vaccine if you are younger than age 65, are over 65 and have contact with an infant, are a healthcare worker, are pregnant, or simply want to be protected from whooping cough. After that, you need a Td   booster dose every 10 years. Consult your caregiver if you have not had at least 3 tetanus and diphtheria-containing shots sometime in your life or have a deep or dirty wound.  HPV. You need this vaccine if you are a woman age 26 or younger. The vaccine is given in 3 doses over 6 months.  Measles, mumps, rubella (MMR). You need at least 1 dose of MMR if you were born in 1957 or later. You may also need a second dose.  Meningococcal. If you are age 19 to 21 and a first-year college student living in a residence hall, or have one of several medical conditions, you need to get vaccinated against meningococcal disease. You may also need additional booster doses.  Zoster (shingles). If you are age 60 or older, you should get this vaccine.  Varicella (chickenpox). If you have never had chickenpox or you were vaccinated but received only 1 dose, talk to your caregiver to find out if you need this vaccine.  Hepatitis A. You need this vaccine if you have a specific risk factor for hepatitis A virus infection or you simply wish to be protected from this disease. The vaccine is usually given as 2 doses, 6 to 18 months apart.  Hepatitis B. You need this vaccine if you have a specific risk factor for hepatitis B virus infection or you simply wish to be protected from this disease. The vaccine is given in 3 doses, usually over 6 months. Preventive Services / Frequency Ages 19 to 39  Blood  pressure check.** / Every 1 to 2 years.  Lipid and cholesterol check.** / Every 5 years beginning at age 20.  Clinical breast exam.** / Every 3 years for women in their 20s and 30s.  Pap test.** / Every 2 years from ages 21 through 29. Every 3 years starting at age 30 through age 65 or 70 with a history of 3 consecutive normal Pap tests.  HPV screening.** / Every 3 years from ages 30 through ages 65 to 70 with a history of 3 consecutive normal Pap tests.  Hepatitis C blood test.** / For any individual with known risks for hepatitis C.  Skin self-exam. / Monthly.  Influenza immunization.** / Every year.  Pneumococcal polysaccharide immunization.** / 1 to 2 doses if you smoke cigarettes or if you have certain chronic medical conditions.  Tetanus, diphtheria, pertussis (Tdap, Td) immunization. / A one-time dose of Tdap vaccine. After that, you need a Td booster dose every 10 years.  HPV immunization. / 3 doses over 6 months, if you are 26 and younger.  Measles, mumps, rubella (MMR) immunization. / You need at least 1 dose of MMR if you were born in 1957 or later. You may also need a second dose.  Meningococcal immunization. / 1 dose if you are age 19 to 21 and a first-year college student living in a residence hall, or have one of several medical conditions, you need to get vaccinated against meningococcal disease. You may also need additional booster doses.  Varicella immunization.** / Consult your caregiver.  Hepatitis A immunization.** / Consult your caregiver. 2 doses, 6 to 18 months apart.  Hepatitis B immunization.** / Consult your caregiver. 3 doses usually over 6 months. Ages 40 to 64  Blood pressure check.** / Every 1 to 2 years.  Lipid and cholesterol check.** / Every 5 years beginning at age 20.  Clinical breast exam.** / Every year after age 40.  Mammogram.** / Every year beginning at age 40   and continuing for as long as you are in good health. Consult with your  caregiver.  Pap test.** / Every 3 years starting at age 30 through age 65 or 70 with a history of 3 consecutive normal Pap tests.  HPV screening.** / Every 3 years from ages 30 through ages 65 to 70 with a history of 3 consecutive normal Pap tests.  Fecal occult blood test (FOBT) of stool. / Every year beginning at age 50 and continuing until age 75. You may not need to do this test if you get a colonoscopy every 10 years.  Flexible sigmoidoscopy or colonoscopy.** / Every 5 years for a flexible sigmoidoscopy or every 10 years for a colonoscopy beginning at age 50 and continuing until age 75.  Hepatitis C blood test.** / For all people born from 1945 through 1965 and any individual with known risks for hepatitis C.  Skin self-exam. / Monthly.  Influenza immunization.** / Every year.  Pneumococcal polysaccharide immunization.** / 1 to 2 doses if you smoke cigarettes or if you have certain chronic medical conditions.  Tetanus, diphtheria, pertussis (Tdap, Td) immunization.** / A one-time dose of Tdap vaccine. After that, you need a Td booster dose every 10 years.  Measles, mumps, rubella (MMR) immunization. / You need at least 1 dose of MMR if you were born in 1957 or later. You may also need a second dose.  Varicella immunization.** / Consult your caregiver.  Meningococcal immunization.** / Consult your caregiver.  Hepatitis A immunization.** / Consult your caregiver. 2 doses, 6 to 18 months apart.  Hepatitis B immunization.** / Consult your caregiver. 3 doses, usually over 6 months. Ages 65 and over  Blood pressure check.** / Every 1 to 2 years.  Lipid and cholesterol check.** / Every 5 years beginning at age 20.  Clinical breast exam.** / Every year after age 40.  Mammogram.** / Every year beginning at age 40 and continuing for as long as you are in good health. Consult with your caregiver.  Pap test.** / Every 3 years starting at age 30 through age 65 or 70 with a 3  consecutive normal Pap tests. Testing can be stopped between 65 and 70 with 3 consecutive normal Pap tests and no abnormal Pap or HPV tests in the past 10 years.  HPV screening.** / Every 3 years from ages 30 through ages 65 or 70 with a history of 3 consecutive normal Pap tests. Testing can be stopped between 65 and 70 with 3 consecutive normal Pap tests and no abnormal Pap or HPV tests in the past 10 years.  Fecal occult blood test (FOBT) of stool. / Every year beginning at age 50 and continuing until age 75. You may not need to do this test if you get a colonoscopy every 10 years.  Flexible sigmoidoscopy or colonoscopy.** / Every 5 years for a flexible sigmoidoscopy or every 10 years for a colonoscopy beginning at age 50 and continuing until age 75.  Hepatitis C blood test.** / For all people born from 1945 through 1965 and any individual with known risks for hepatitis C.  Osteoporosis screening.** / A one-time screening for women ages 65 and over and women at risk for fractures or osteoporosis.  Skin self-exam. / Monthly.  Influenza immunization.** / Every year.  Pneumococcal polysaccharide immunization.** / 1 dose at age 65 (or older) if you have never been vaccinated.  Tetanus, diphtheria, pertussis (Tdap, Td) immunization. / A one-time dose of Tdap vaccine if you are over   65 and have contact with an infant, are a healthcare worker, or simply want to be protected from whooping cough. After that, you need a Td booster dose every 10 years.  Varicella immunization.** / Consult your caregiver.  Meningococcal immunization.** / Consult your caregiver.  Hepatitis A immunization.** / Consult your caregiver. 2 doses, 6 to 18 months apart.  Hepatitis B immunization.** / Check with your caregiver. 3 doses, usually over 6 months. ** Family history and personal history of risk and conditions may change your caregiver's recommendations. Document Released: 01/03/2002 Document Revised: 01/30/2012  Document Reviewed: 04/04/2011 [employer] Patient Information 2013 [employer].  

## 2013-02-06 NOTE — Assessment & Plan Note (Signed)
Check labs 

## 2013-02-07 ENCOUNTER — Other Ambulatory Visit: Payer: Self-pay | Admitting: Family Medicine

## 2013-02-07 ENCOUNTER — Encounter: Payer: Self-pay | Admitting: Family Medicine

## 2013-02-07 NOTE — Telephone Encounter (Signed)
Ok to give ambien 5 mg #30  No refills

## 2013-02-07 NOTE — Telephone Encounter (Signed)
Last seen 02/06/13 and filled 11/24/11 #30. Please advise     KP

## 2013-02-08 MED ORDER — FLUOXETINE HCL 10 MG PO TABS: 10.0000 mg | ORAL_TABLET | Freq: Every day | ORAL | Status: DC

## 2013-02-08 MED ORDER — ZOLPIDEM TARTRATE 10 MG PO TABS: ORAL_TABLET | ORAL | Status: DC

## 2013-02-08 MED ORDER — EZETIMIBE-SIMVASTATIN 10-40 MG PO TABS: 1.0000 | ORAL_TABLET | Freq: Every day | ORAL | Status: DC

## 2013-02-08 MED ORDER — ZOLPIDEM TARTRATE 5 MG PO TABS: 5.0000 mg | ORAL_TABLET | Freq: Every evening | ORAL | Status: DC | PRN

## 2013-02-08 NOTE — Telephone Encounter (Signed)
Last filled 11/24/11. Please advise    KP

## 2013-02-08 NOTE — Addendum Note (Signed)
Addended by: Candie Echevaria L on: 02/08/2013 05:30 PM   Modules accepted: Orders, Medications

## 2013-02-08 NOTE — Telephone Encounter (Signed)
Rx sent   ----- Message from Lelon Perla, DO sent at 02/08/2013 9:40 AM ----- prozac does not have a 5 mg dose--- we can do 10 mg daily----- f/u in Month Ok to refill [facility]

## 2013-02-08 NOTE — Telephone Encounter (Addendum)
Rx for Ambien 10 mg from previous encounter shredded.

## 2013-02-09 ENCOUNTER — Encounter: Payer: Self-pay | Admitting: Family Medicine

## 2013-02-14 ENCOUNTER — Other Ambulatory Visit: Payer: Self-pay | Admitting: Family Medicine

## 2013-02-14 DIAGNOSIS — G4700 Insomnia, unspecified: Secondary | ICD-10-CM

## 2013-02-14 MED ORDER — ZOLPIDEM TARTRATE 5 MG PO TABS: 5.0000 mg | ORAL_TABLET | Freq: Every evening | ORAL | Status: DC | PRN

## 2013-02-14 NOTE — Telephone Encounter (Signed)
Rx for ambien faxed to pharmacy.  

## 2013-02-28 ENCOUNTER — Encounter: Payer: Self-pay | Admitting: Family Medicine

## 2013-04-05 ENCOUNTER — Encounter: Payer: Self-pay | Admitting: Family Medicine

## 2013-04-24 ENCOUNTER — Other Ambulatory Visit: Payer: Self-pay | Admitting: Family Medicine

## 2013-04-24 NOTE — Telephone Encounter (Signed)
Last seen 02/06/13 and filled 02/14/13#30. Please advise    KP

## 2013-04-26 ENCOUNTER — Telehealth: Payer: Self-pay | Admitting: Family Medicine

## 2013-04-26 NOTE — Telephone Encounter (Signed)
In reference to GI referral entered 02/06/13, patient was contacted and left multiple messages by [facility], they also mailed her a letter, as did I.  As of today, patient will not respond.

## 2013-04-26 NOTE — Telephone Encounter (Signed)
noted 

## 2013-06-26 ENCOUNTER — Other Ambulatory Visit: Payer: Self-pay

## 2013-07-01 ENCOUNTER — Ambulatory Visit (INDEPENDENT_AMBULATORY_CARE_PROVIDER_SITE_OTHER): Payer: 59 | Admitting: Family Medicine

## 2013-07-01 ENCOUNTER — Encounter: Payer: Self-pay | Admitting: Family Medicine

## 2013-07-01 VITALS — BP 116/74 | HR 80 | Temp 98.2°F | Wt 120.2 lb

## 2013-07-01 DIAGNOSIS — L290 Pruritus ani: Secondary | ICD-10-CM

## 2013-07-01 DIAGNOSIS — B80 Enterobiasis: Secondary | ICD-10-CM

## 2013-07-01 MED ORDER — MEBENDAZOLE 100 MG PO CHEW: 100.0000 mg | CHEWABLE_TABLET | Freq: Once | ORAL | Status: DC

## 2013-07-01 MED ORDER — HYDROCORTISONE ACE-PRAMOXINE 1-1 % RE FOAM: 1.0000 | Freq: Two times a day (BID) | RECTAL | Status: DC

## 2013-07-01 NOTE — Patient Instructions (Addendum)
Hydrocortisone rectal aerosol foam What is this medicine? HYDROCORTISONE (hye droe KOR ti sone) is a corticosteroid. It helps to reduce swelling, redness, and itching caused by ulcerative proctitis. This medicine may be used for other purposes; ask your health care provider or pharmacist if you have questions. What should I tell my health care provider before I take this medicine? They need to know if you have any of these conditions: -any active infection -decreased immune function -diabetes -glaucoma or cataracts -high blood pressure -previous heart attack -rectal obstruction, abscess, perforation or fistula -stomach or intestinal disease -thyroid disease -an unusual or allergic reaction to hydrocortisone, corticosteroids, other medicines, foods, dyes, or preservatives -pregnant or trying to get pregnant -breast-feeding How should I use this medicine? This medicine is only for use in the rectum. Do not take by mouth. Wash hands before and after use. Shake the container well. Use the special applicator. Do not insert any part of the aerosol container in the rectum. Hold canister upright and insert into the opening of the tip of the applicator. Make sure the applicator plunger is drawn all the way out. Fill the applicator by pressing down slowly on the canister cap. When the foam reaches the fill line in the applicator, it is ready for use. Remove the applicator from the container, let some foam remain on the applicator tip. Hold the barrel of the applicator and insert the tip into the rectum. Push the plunger in to expel the foam into the rectum. Withdraw the applicator. Thoroughly clean the applicator with warm water. Do not use your medicine more often than directed. Do not suddenly stop using your medicine because you may develop a severe reaction. Your doctor will tell you how much medicine to use. If your doctor wants you to stop using the medicine, the amount that you use may be slowly  lowered over time to avoid any side effects. Talk to your pediatrician regarding the use of this medicine in children. Special care may be needed. Overdosage: If you think you have taken too much of this medicine contact a poison control center or emergency room at once. NOTE: This medicine is only for you. Do not share this medicine with others. What if I miss a dose? If you miss a dose, use it as soon as you can. If it is almost time for your next dose, use only that dose. Do not use double or extra doses. What may interact with this medicine? -aminoglutethimide -amphotericin B -aspirin -barbiturates, like phenobarbital -carbamazepine -certain antibiotics like clarithromycin or erythromycin -cholestyramine -cyclosporine -digoxin -diuretics -female hormones, like estrogens or progestins and birth control pills -isoniazid -ketoconazole -medicines for Alzheimer's disease -medicines for diabetes -medicines that improve muscle strength or tone for conditions like myasthenia gravis -NSAIDs, medicines for pain and inflammation, like ibuprofen or naproxen -phenytoin -rifampin -toxoids and vaccines -warfarin This list may not describe all possible interactions. Give your health care provider a list of all the medicines, herbs, non-prescription drugs, or dietary supplements you use. Also tell them if you smoke, drink alcohol, or use illegal drugs. Some items may interact with your medicine. What should I watch for while using this medicine? Visit your doctor or health care professional for regular checks on your progress. Consult your doctor or health care professional you do not start to get better after several days of use. Do not use if there is blood in your stools. Report rectal bleeding, pain, burning, itching, blistering, or any other sign of irritation to your  doctor or health care professional. This medicine may increase your risk of getting an infection. Stay away from people who are  sick. Tell your doctor or health care professional if you are around anyone with measles or chickenpox. The medicine can increase your blood sugar. If you are a diabetic check with your doctor if you need help adjusting the dose of your diabetic medicine. What side effects may I notice from receiving this medicine? Side effects that you should report to your doctor or health care professional as soon as possible: -allergic reactions like skin rash, itching or hives, swelling of the face, lips, or tongue -fever, sore throat, sneezing, cough, or other signs of infection -mental depression, mood swings, mistaken feelings of self-importance, mistaken feelings of being mistreated -muscle cramps or muscle weakness -nausea -rectal pain, burning or bleeding after use of medicine -skin problems, acne -swelling of feet or lower legs -thinning of the skin -unusual bruising or red pinpoint spots on the skin -unusually weak or tired -weight gain -wounds that will not heal Side effects that usually do not require medical attention (report to your doctor or health care professional if they continue or are bothersome): -diarrhea or constipation -difficulty sleeping -headache -increased appetite -increased sweating -menstrual problems This list may not describe all possible side effects. Call your doctor for medical advice about side effects. You may report side effects to FDA at 1-800-FDA-1088. Where should I keep my medicine? Keep out of the reach of children. Store at room temperature between 20 and 25 degrees C (68 and 77 degrees F). Do not refrigerate. Throw away any unused medicine after the expiration date. NOTE: This sheet is a summary. It may not cover all possible information. If you have questions about this medicine, talk to your doctor, pharmacist, or health care provider.  2012, Elsevier/Gold Standard. (03/21/2008 3:58:44 PM)

## 2013-07-01 NOTE — Assessment & Plan Note (Addendum)
Scotch tape test Vermox x1 Also given proctofoam for small hemorhoid rto prn

## 2013-07-01 NOTE — Progress Notes (Signed)
  Subjective:    Patient ID: Samantha Miranda, female    DOB: 18-Jan-1946, 67 y.o.   MRN: [id number]  HPI Pt here c/o rectal itching.   Pt used hemrrhoid cream which helped but she thinks she has worms .Marland Kitchen  Symptoms for several week.       Review of Systems As abive    Objective:   Physical Exam BP 116/74  Pulse 80  Temp(Src) 98.2 F (36.8 C) (Oral)  Wt 120 lb 3.2 oz (54.522 kg)  BMI 21.3 kg/m2  SpO2 97% General appearance: alert, cooperative, appears stated age and no distress Abdomen: soft, non-tender; bowel sounds normal; no masses,  no organomegaly Rectal---+tiny hemrrhoid ,  ? Worm visualized       Assessment & Plan:

## 2013-07-02 ENCOUNTER — Telehealth: Payer: Self-pay

## 2013-07-02 ENCOUNTER — Other Ambulatory Visit: Payer: Self-pay | Admitting: Family Medicine

## 2013-07-02 DIAGNOSIS — B80 Enterobiasis: Secondary | ICD-10-CM

## 2013-07-02 MED ORDER — ALBENDAZOLE 200 MG PO TABS: 400.0000 mg | ORAL_TABLET | Freq: Two times a day (BID) | ORAL | Status: DC

## 2013-07-02 NOTE — Telephone Encounter (Signed)
Albendazole 400 mg #2  1 po qd, may repeat in 2 weeks prn--sent

## 2013-07-02 NOTE — Telephone Encounter (Signed)
Call from pharmacist advising Vermox is not longer on the market and we would need to send something different. Please advise   KP

## 2013-07-03 ENCOUNTER — Encounter: Payer: Self-pay | Admitting: Family Medicine

## 2013-07-04 ENCOUNTER — Telehealth: Payer: Self-pay | Admitting: *Deleted

## 2013-07-04 ENCOUNTER — Other Ambulatory Visit: Payer: Self-pay | Admitting: Family Medicine

## 2013-07-04 DIAGNOSIS — K649 Unspecified hemorrhoids: Secondary | ICD-10-CM

## 2013-07-04 MED ORDER — HYDROCORTISONE ACE-PRAMOXINE 2.5-1 % RE CREA: TOPICAL_CREAM | Freq: Three times a day (TID) | RECTAL | Status: DC

## 2013-07-04 NOTE — Telephone Encounter (Signed)
Please advise      KP 

## 2013-07-04 NOTE — Telephone Encounter (Signed)
done

## 2013-07-04 NOTE — Telephone Encounter (Signed)
[facility] pharmacy has sent a fax stating that they do not have any proctofoam for patient with no release date available at this time.  They are requesting that you prescribe an alternate medication for now.  Please Advise   AG cma

## 2013-07-09 ENCOUNTER — Encounter: Payer: Self-pay | Admitting: Family Medicine

## 2013-08-12 ENCOUNTER — Ambulatory Visit (INDEPENDENT_AMBULATORY_CARE_PROVIDER_SITE_OTHER): Payer: 59 | Admitting: Family Medicine

## 2013-08-12 ENCOUNTER — Encounter: Payer: Self-pay | Admitting: Family Medicine

## 2013-08-12 ENCOUNTER — Ambulatory Visit (HOSPITAL_BASED_OUTPATIENT_CLINIC_OR_DEPARTMENT_OTHER)
Admission: RE | Admit: 2013-08-12 | Discharge: 2013-08-12 | Disposition: A | Discharge status: Home / Self Care | Payer: Medicare Other | Source: Ambulatory Visit | Attending: Family Medicine | Admitting: Family Medicine

## 2013-08-12 VITALS — BP 116/80 | HR 73 | Temp 98.1°F | Wt 118.6 lb

## 2013-08-12 DIAGNOSIS — E785 Hyperlipidemia, unspecified: Secondary | ICD-10-CM

## 2013-08-12 DIAGNOSIS — R059 Cough, unspecified: Secondary | ICD-10-CM | POA: Insufficient documentation

## 2013-08-12 DIAGNOSIS — R5381 Other malaise: Secondary | ICD-10-CM | POA: Insufficient documentation

## 2013-08-12 DIAGNOSIS — J329 Chronic sinusitis, unspecified: Secondary | ICD-10-CM

## 2013-08-12 DIAGNOSIS — J069 Acute upper respiratory infection, unspecified: Secondary | ICD-10-CM

## 2013-08-12 DIAGNOSIS — R05 Cough: Secondary | ICD-10-CM

## 2013-08-12 DIAGNOSIS — R55 Syncope and collapse: Secondary | ICD-10-CM

## 2013-08-12 DIAGNOSIS — M25511 Pain in right shoulder: Secondary | ICD-10-CM

## 2013-08-12 DIAGNOSIS — M25519 Pain in unspecified shoulder: Secondary | ICD-10-CM

## 2013-08-12 LAB — HEPATIC FUNCTION PANEL
ALT: 14 U/L (ref 0–35)
AST: 22 U/L (ref 0–37)
Albumin: 4 g/dL (ref 3.5–5.2)
Alkaline Phosphatase: 49 U/L (ref 39–117)
Total Protein: 7.4 g/dL (ref 6.0–8.3)

## 2013-08-12 LAB — TSH: TSH: 0.47 u[IU]/mL (ref 0.35–5.50)

## 2013-08-12 LAB — CBC WITH DIFFERENTIAL/PLATELET
Basophils Absolute: 0.1 10*3/uL (ref 0.0–0.1)
Basophils Relative: 1.8 % (ref 0.0–3.0)
HCT: 38.2 % (ref 36.0–46.0)
Hemoglobin: 12.9 g/dL (ref 12.0–15.0)
Lymphocytes Relative: 28.9 % (ref 12.0–46.0)
Lymphs Abs: 1.5 10*3/uL (ref 0.7–4.0)
Monocytes Relative: 7.9 % (ref 3.0–12.0)
Neutro Abs: 3 10*3/uL (ref 1.4–7.7)
RBC: 4.23 Mil/uL (ref 3.87–5.11)
RDW: 13.3 % (ref 11.5–14.6)

## 2013-08-12 LAB — LDL CHOLESTEROL, DIRECT: Direct LDL: 176.2 mg/dL

## 2013-08-12 LAB — BASIC METABOLIC PANEL
CO2: 28 mEq/L (ref 19–32)
Calcium: 9 mg/dL (ref 8.4–10.5)
GFR: 76.17 mL/min (ref >60.00)
Glucose, Bld: 80 mg/dL (ref 70–99)
Potassium: 3.6 mEq/L (ref 3.5–5.1)
Sodium: 139 mEq/L (ref 135–145)

## 2013-08-12 LAB — LIPID PANEL: Cholesterol: 251 mg/dL — ABNORMAL HIGH (ref 0–200)

## 2013-08-12 MED ORDER — FLUTICASONE PROPIONATE 50 MCG/ACT NA SUSP: 2.0000 | Freq: Every day | NASAL | Status: DC

## 2013-08-12 MED ORDER — AZITHROMYCIN 250 MG PO TABS: ORAL_TABLET | ORAL | Status: DC

## 2013-08-12 NOTE — Assessment & Plan Note (Signed)
Check labs 

## 2013-08-12 NOTE — Assessment & Plan Note (Signed)
Check labs Check US carotid ekg --- no acute changes

## 2013-08-12 NOTE — Patient Instructions (Addendum)

## 2013-08-12 NOTE — Progress Notes (Signed)
  Subjective:     QUINETTE Miranda is a 67 y.o. female who presents for evaluation of symptoms of a URI. Symptoms include congestion, nasal congestion, non productive cough, post nasal drip, shortness of breath and sinus pressure. Onset of symptoms was 4 months ago, and has been gradually worsening since that time. Treatment to date: antihistamines. Pt states when this started she had "an episode"  Where she got light headed.  She was in [place] with her husband when it happened.  She had to lean up against him until it past --- about 15 min.    Pt also here to f/u with lipids.  She had coffee with creamer this am.     The following portions of the patient's history were reviewed and updated as appropriate: allergies, current medications, past family history, past medical history, past social history, past surgical history and problem list.  Review of Systems Pertinent items are noted in HPI.   Objective:    BP 116/80  Pulse 73  Temp(Src) 98.1 F (36.7 C) (Oral)  Wt 118 lb 9.6 oz (53.797 kg)  BMI 21.01 kg/m2  SpO2 97% General appearance: alert, cooperative, appears stated age and no distress Ears: normal TM's and external ear canals both ears Nose: green discharge, moderate congestion, turbinates red, swollen, sinus tenderness bilateral Throat: lips, mucosa, and tongue normal; teeth and gums normal Neck: no adenopathy, no carotid bruit, supple, symmetrical, trachea midline and thyroid not enlarged, symmetric, no tenderness/mass/nodules Lungs: clear to auscultation bilaterally Heart: S1, S2 normal Extremities: extremities normal, atraumatic, no cyanosis or edema   Assessment:    sinusitis   Plan:    Discussed the diagnosis and treatment of sinusitis. Suggested symptomatic OTC remedies. Nasal saline spray for congestion. Nasal steroids per orders. Follow up as needed.  rx abx if symptoms worsen

## 2013-08-13 ENCOUNTER — Ambulatory Visit (HOSPITAL_BASED_OUTPATIENT_CLINIC_OR_DEPARTMENT_OTHER)
Admission: RE | Admit: 2013-08-13 | Discharge: 2013-08-13 | Disposition: A | Discharge status: Home / Self Care | Payer: Medicare Other | Source: Ambulatory Visit | Attending: Family Medicine | Admitting: Family Medicine

## 2013-08-13 DIAGNOSIS — Z87891 Personal history of nicotine dependence: Secondary | ICD-10-CM | POA: Insufficient documentation

## 2013-08-13 DIAGNOSIS — R5381 Other malaise: Secondary | ICD-10-CM | POA: Insufficient documentation

## 2013-08-13 DIAGNOSIS — R55 Syncope and collapse: Secondary | ICD-10-CM | POA: Insufficient documentation

## 2013-08-13 LAB — POCT URINALYSIS DIPSTICK
Bilirubin, UA: NEGATIVE
Blood, UA: NEGATIVE
Glucose, UA: NEGATIVE
Ketones, UA: NEGATIVE
Spec Grav, UA: 1.01
Urobilinogen, UA: 0.2

## 2013-08-13 LAB — MICROALBUMIN / CREATININE URINE RATIO
Creatinine,U: 101.4 mg/dL
Microalb Creat Ratio: 1.1 mg/g (ref 0.0–30.0)
Microalb, Ur: 1.1 mg/dL (ref 0.0–1.9)

## 2013-08-15 ENCOUNTER — Telehealth: Payer: Self-pay | Admitting: General Practice

## 2013-08-15 MED ORDER — EZETIMIBE 10 MG PO TABS: 10.0000 mg | ORAL_TABLET | Freq: Every day | ORAL | Status: DC

## 2013-08-15 NOTE — Telephone Encounter (Signed)
Med filled and pt notified.  

## 2013-08-15 NOTE — Telephone Encounter (Signed)
Spoke with pt regarding her recent labs. Pt stated that she cannot take lipitor due to it causing her muscle aches. States she is able to tolerate zetia. This time she is sure it would work because she is no longer smoking and is exercising.

## 2013-08-15 NOTE — Telephone Encounter (Signed)
zetia 10 mg #30  1 po qd 2 refills

## 2013-08-19 ENCOUNTER — Encounter: Payer: Self-pay | Admitting: Family Medicine

## 2013-08-19 ENCOUNTER — Ambulatory Visit (INDEPENDENT_AMBULATORY_CARE_PROVIDER_SITE_OTHER): Payer: 59 | Admitting: Family Medicine

## 2013-08-19 ENCOUNTER — Ambulatory Visit: Payer: 59 | Admitting: Family Medicine

## 2013-08-19 VITALS — BP 120/81 | HR 79 | Ht 64.0 in | Wt 118.0 lb

## 2013-08-19 DIAGNOSIS — M25519 Pain in unspecified shoulder: Secondary | ICD-10-CM

## 2013-08-19 DIAGNOSIS — M25511 Pain in right shoulder: Secondary | ICD-10-CM

## 2013-08-19 NOTE — Patient Instructions (Addendum)
You have rotator cuff impingement Try to avoid painful activities (overhead activities, lifting with extended arm) as much as possible. Aleve 2 tabs twice a day with food OR ibuprofen 3 tabs three times a day with food for pain and inflammation. Can take tylenol in addition to this. Ok to continue your tumeric also. Subacromial injection may be beneficial to help with pain and to decrease inflammation - consider this in future. Do home exercise program with theraband and scapular stabilization exercises daily - these are very important for long term relief even if an injection was given - 3 sets of 10 each exercise once a day. If not improving at follow-up we will consider injection, physical therapy and/or nitro patches. Follow up with me in 5-6 weeks.

## 2013-08-20 ENCOUNTER — Encounter: Payer: Self-pay | Admitting: Family Medicine

## 2013-08-20 DIAGNOSIS — M25511 Pain in right shoulder: Secondary | ICD-10-CM | POA: Insufficient documentation

## 2013-08-20 NOTE — Progress Notes (Signed)
Patient ID: Samantha Miranda, female   DOB: [birth year], 67 y.o.   MRN: [id number]  PCP: Loreen Freud, DO  Subjective:   HPI: Patient is a 67 y.o. female here for right shoulder pain.  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 No obvious injury. States for past 6-8 weeks though she has had pain in right shoulder. Worse with lifting out to side. No numbness or tingling. No swelling or bruising. No radiation. Tumeric helps. No prior injuries. Right handed.  Past Medical History  Diagnosis Date  . Cancer     Brest cancer  . Hyperlipidemia   . Osteopenia     Current Outpatient Prescriptions on File Prior to Visit  Medication Sig Dispense Refill  . azithromycin (ZITHROMAX Z-PAK) 250 MG tablet As directed  6 each  0  . Cholecalciferol (VITAMIN D) 2000 UNITS tablet Take 4,000 Units by mouth daily.      . Coenzyme Q10 (COQ10 PO) Take 1 each by mouth daily.      Marland Kitchen ezetimibe (ZETIA) 10 MG tablet Take 1 tablet (10 mg total) by mouth daily.  30 tablet  2  . FLUoxetine (PROZAC) 10 MG tablet take 1 tablet by mouth once daily  30 tablet  2  . fluticasone (FLONASE) 50 MCG/ACT nasal spray Place 2 sprays into the nose daily.  16 g  6  . hydrocortisone-pramoxine (ANALPRAM-HC) 2.5-1 % rectal cream Place rectally 3 (three) times daily.  30 g  0  . MAGNESIUM PO Take 1 each by mouth daily.      Marland Kitchen OVER THE COUNTER MEDICATION Take 4 tablets by mouth daily. Betaglucen.      . Probiotic Product (PROBIOTIC DAILY PO) Take 1 tablet by mouth daily.      Marland Kitchen zolpidem (AMBIEN) 5 MG tablet take 1 tablet by mouth at bedtime if needed for sleep  30 tablet  0   No current facility-administered medications on file prior to visit.    Past Surgical History  Procedure Laterality Date  . Abdominal hysterectomy  1991  . Breast lumpectomy  9/07    Allergies  Allergen Reactions  . Amoxicillin-Pot Clavulanate Nausea And Vomiting  . Moxifloxacin     History   Social History  .  Marital Status: Married    Spouse Name: N/A    Number of Children: N/A  . Years of Education: N/A   Occupational History  .  [employer]    retired   Social History Main Topics  . Smoking status: Former Games developer  . Smokeless tobacco: Former Neurosurgeon    Quit date: 08/26/2012  . Alcohol Use: No  . Drug Use: No  . Sexual Activity: Not on file   Other Topics Concern  . Not on file   Social History Narrative  . No narrative on file    Family History  Problem Relation Age of Onset  . Colon cancer    . Breast cancer    . Lung cancer    . Diabetes Maternal Grandmother   . Heart failure Mother     CHF  . Hyperlipidemia Mother   . Heart attack Neg Hx   . Hypertension Neg Hx   . Sudden death Neg Hx     BP 120/81  Pulse 79  Ht 5\' 4"  (1.626 m)  Wt 118 lb (53.524 kg)  BMI 20.24 kg/m2  Review of Systems: See HPI above.    Objective:  Physical Exam:  Gen:  NAD  Right shoulder: No swelling, ecchymoses.  No gross deformity. No TTP. FROM with painful arc. Mild positive Hawkins, Neers. Negative Speeds, Yergasons. Strength 5/5 with empty can and resisted internal/external rotation.  Mild pain empty can. Negative apprehension. NV intact distally.    Assessment & Plan:  1. Right shoulder pain - 2/2 rotator cuff impingement.  Reassured patient.  Nsaids, home exercise program reviewed to do daily.  Consider nitro, injection, PT if not improving as expected.  F/u in 5-6 weeks.

## 2013-08-20 NOTE — Assessment & Plan Note (Signed)
2/2 rotator cuff impingement.  Reassured patient.  Nsaids, home exercise program reviewed to do daily.  Consider nitro, injection, PT if not improving as expected.  F/u in 5-6 weeks.

## 2013-08-21 ENCOUNTER — Other Ambulatory Visit: Payer: Self-pay

## 2013-08-21 MED ORDER — ATORVASTATIN CALCIUM 20 MG PO TABS: 20.0000 mg | ORAL_TABLET | Freq: Every day | ORAL | Status: DC

## 2013-08-29 ENCOUNTER — Other Ambulatory Visit: Payer: Self-pay | Admitting: Family Medicine

## 2013-09-26 ENCOUNTER — Other Ambulatory Visit: Payer: Self-pay

## 2013-09-30 ENCOUNTER — Ambulatory Visit: Payer: 59 | Admitting: Family Medicine

## 2013-10-01 ENCOUNTER — Encounter: Payer: Self-pay | Admitting: Family Medicine

## 2013-11-20 ENCOUNTER — Ambulatory Visit (INDEPENDENT_AMBULATORY_CARE_PROVIDER_SITE_OTHER): Payer: 59 | Admitting: Family Medicine

## 2013-11-20 ENCOUNTER — Encounter: Payer: Self-pay | Admitting: Family Medicine

## 2013-11-20 VITALS — BP 156/88 | HR 74 | Ht 64.0 in | Wt 118.0 lb

## 2013-11-20 DIAGNOSIS — M25512 Pain in left shoulder: Secondary | ICD-10-CM

## 2013-11-20 DIAGNOSIS — M25519 Pain in unspecified shoulder: Secondary | ICD-10-CM

## 2013-11-20 NOTE — Patient Instructions (Signed)
You have rotator cuff impingement, tendinitis. Try to avoid painful activities (overhead activities, lifting with extended arm) as much as possible. Aleve 2 tabs twice a day with food OR ibuprofen 3 tabs three times a day with food for pain and inflammation. Can take tylenol in addition to this. Subacromial injection may be beneficial to help with pain and to decrease inflammation - you were given this today. Consider physical therapy with transition to home exercise program - call me if you want to do this. Do home exercise program with theraband and scapular stabilization exercises daily - these are very important for long term relief even if an injection was given - 3 sets of 10 of each exercise once a day. If not improving at follow-up we will consider further imaging, physical therapy and/or nitro patches. Follow up with me in 1 month for reevaluation or as needed if you feel better.

## 2013-11-22 ENCOUNTER — Encounter: Payer: Self-pay | Admitting: Family Medicine

## 2013-11-22 DIAGNOSIS — M25512 Pain in left shoulder: Secondary | ICD-10-CM | POA: Insufficient documentation

## 2013-11-22 NOTE — Progress Notes (Signed)
Patient ID: Samantha Miranda, female   DOB: 1946-10-16, 68 y.o.   MRN: [id number]  PCP: Garnet Koyanagi, DO  Subjective:   HPI: Patient is a 68 y.o. female here for right shoulder pain.  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 No obvious injury. States for past 6-8 weeks though she has had pain in right shoulder. Worse with lifting out to side. No numbness or tingling. No swelling or bruising. No radiation. Tumeric helps. No prior injuries. Right handed.  12/31: Patient reports her left shoulder improved especially with the home exercise program. However past several weeks she's developed same pain (but worse) in her right shoulder. Motion more limited. + night pain. Tried ibuprofen and home exercises (too painful to do these). Right handed. Worse with overhead motions, reaching.  Past Medical History  Diagnosis Date  . Cancer     Brest cancer  . Hyperlipidemia   . Osteopenia     Current Outpatient Prescriptions on File Prior to Visit  Medication Sig Dispense Refill  . atorvastatin (LIPITOR) 20 MG tablet Take 1 tablet (20 mg total) by mouth daily.  30 tablet  2  . azithromycin (ZITHROMAX Z-PAK) 250 MG tablet As directed  6 each  0  . Cholecalciferol (VITAMIN D) 2000 UNITS tablet Take 4,000 Units by mouth daily.      . Coenzyme Q10 (COQ10 PO) Take 1 each by mouth daily.      Marland Kitchen ezetimibe (ZETIA) 10 MG tablet Take 1 tablet (10 mg total) by mouth daily.  30 tablet  2  . FLUoxetine (PROZAC) 10 MG tablet take 1 tablet by mouth once daily  30 tablet  5  . fluticasone (FLONASE) 50 MCG/ACT nasal spray Place 2 sprays into the nose daily.  16 g  6  . hydrocortisone-pramoxine (ANALPRAM-HC) 2.5-1 % rectal cream Place rectally 3 (three) times daily.  30 g  0  . MAGNESIUM PO Take 1 each by mouth daily.      Marland Kitchen OVER THE COUNTER MEDICATION Take 4 tablets by mouth daily. Betaglucen.      . Probiotic Product (PROBIOTIC DAILY PO) Take 1 tablet by mouth  daily.      Marland Kitchen zolpidem (AMBIEN) 5 MG tablet take 1 tablet by mouth at bedtime if needed for sleep  30 tablet  0   No current facility-administered medications on file prior to visit.    Past Surgical History  Procedure Laterality Date  . Abdominal hysterectomy  1991  . Breast lumpectomy  9/07    Allergies  Allergen Reactions  . Amoxicillin-Pot Clavulanate Nausea And Vomiting  . Moxifloxacin     History   Social History  . Marital Status: Married    Spouse Name: N/A    Number of Children: N/A  . Years of Education: N/A   Occupational History  .  [employer]    retired   Social History Main Topics  . Smoking status: Former Research scientist (life sciences)  . Smokeless tobacco: Former Systems developer    Quit date: 08/26/2012  . Alcohol Use: No  . Drug Use: No  . Sexual Activity: Not on file   Other Topics Concern  . Not on file   Social History Narrative  . No narrative on file    Family History  Problem Relation Age of Onset  . Colon cancer    . Breast cancer    . Lung cancer    . Diabetes Maternal Grandmother   .  Heart failure Mother     CHF  . Hyperlipidemia Mother   . Heart attack Neg Hx   . Hypertension Neg Hx   . Sudden death Neg Hx     BP 156/88  Pulse 74  Ht 5\' 4"  (1.626 m)  Wt 118 lb (53.524 kg)  BMI 20.24 kg/m2  Review of Systems: See HPI above.    Objective:  Physical Exam:  Gen: NAD  Left shoulder: No swelling, ecchymoses.  No gross deformity. No TTP. FROM with painful arc. Positive Hawkins, Neers. Negative Speeds, Yergasons. Strength 5/5 with empty can and resisted internal/external rotation.  Mild pain empty can. Negative apprehension. NV intact distally.    Assessment & Plan:  1. Left shoulder pain - 2/2 rotator cuff impingement, tendinitis.  Reassured patient.  Nsaids, home exercise program reviewed to do daily.  Subacromial injection given today.  Consider nitro, PT if not improving.  See instructions for further.  F/u in 1 month or  prn.  After informed written consent, patient was seated on exam table. Left shoulder was prepped with alcohol swab and utilizing posterior approach, patient's left subacromial space was injected with 3:1 marcaine: depomedrol. Patient tolerated the procedure well without immediate complications.

## 2013-11-22 NOTE — Assessment & Plan Note (Signed)
2/2 rotator cuff impingement, tendinitis.  Reassured patient.  Nsaids, home exercise program reviewed to do daily.  Subacromial injection given today.  Consider nitro, PT if not improving.  See instructions for further.  F/u in 1 month or prn.  After informed written consent, patient was seated on exam table. Left shoulder was prepped with alcohol swab and utilizing posterior approach, patient's left subacromial space was injected with 3:1 marcaine: depomedrol. Patient tolerated the procedure well without immediate complications.

## 2014-02-05 ENCOUNTER — Ambulatory Visit (INDEPENDENT_AMBULATORY_CARE_PROVIDER_SITE_OTHER): Payer: Medicare Other | Admitting: Family Medicine

## 2014-02-05 ENCOUNTER — Encounter: Payer: Self-pay | Admitting: Family Medicine

## 2014-02-05 ENCOUNTER — Other Ambulatory Visit: Payer: Self-pay | Admitting: Family Medicine

## 2014-02-05 VITALS — BP 157/90 | HR 66 | Ht 64.0 in | Wt 118.0 lb

## 2014-02-05 DIAGNOSIS — M25579 Pain in unspecified ankle and joints of unspecified foot: Secondary | ICD-10-CM

## 2014-02-05 DIAGNOSIS — M25571 Pain in right ankle and joints of right foot: Secondary | ICD-10-CM

## 2014-02-05 NOTE — Patient Instructions (Signed)
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 Start home exercises (calf raises, internal rotation exercise of ankle) with theraband daily. Icing 15 minutes at a time 3-4 times a day. Ibuprofen 600mg  three times a day OR aleve 2 tabs twice a day with food for 1 week then as needed. Better arch support is also important (something like dr. Zoe Lan active series). Avoid barefoot walking or flat shoes until this has resolved. Otherwise activities as tolerated. Can consider physical therapy, nitro patches, custom orthotics if not improving as expected. Follow up with me in 1 month or as needed.

## 2014-02-10 ENCOUNTER — Encounter: Payer: Self-pay | Admitting: Family Medicine

## 2014-02-10 DIAGNOSIS — M25571 Pain in right ankle and joints of right foot: Secondary | ICD-10-CM | POA: Insufficient documentation

## 2014-02-10 NOTE — Progress Notes (Signed)
Patient ID: Samantha Miranda, female   DOB: 1946/09/21, 68 y.o.   MRN: [id number]  PCP: Garnet Koyanagi, DO  Subjective:   HPI: Patient is a 68 y.o. female here for right ankle pain.  Patient denies known injury. About 1 month has had pain, swelling medial right ankle. Using realtime cream and wearing an elastic brace. Not icing. Worse in morning. No prior issues.  Past Medical History  Diagnosis Date  . Cancer     Brest cancer  . Hyperlipidemia   . Osteopenia     Current Outpatient Prescriptions on File Prior to Visit  Medication Sig Dispense Refill  . atorvastatin (LIPITOR) 20 MG tablet Take 1 tablet (20 mg total) by mouth daily.  30 tablet  2  . azithromycin (ZITHROMAX Z-PAK) 250 MG tablet As directed  6 each  0  . Cholecalciferol (VITAMIN D) 2000 UNITS tablet Take 4,000 Units by mouth daily.      . Coenzyme Q10 (COQ10 PO) Take 1 each by mouth daily.      Marland Kitchen ezetimibe (ZETIA) 10 MG tablet Take 1 tablet (10 mg total) by mouth daily.  30 tablet  2  . FLUoxetine (PROZAC) 10 MG tablet take 1 tablet by mouth once daily  30 tablet  5  . fluticasone (FLONASE) 50 MCG/ACT nasal spray Place 2 sprays into the nose daily.  16 g  6  . hydrocortisone-pramoxine (ANALPRAM-HC) 2.5-1 % rectal cream apply to affected area rectally three times a day  30 g  1  . MAGNESIUM PO Take 1 each by mouth daily.      Marland Kitchen OVER THE COUNTER MEDICATION Take 4 tablets by mouth daily. Betaglucen.      . Probiotic Product (PROBIOTIC DAILY PO) Take 1 tablet by mouth daily.      Marland Kitchen zolpidem (AMBIEN) 5 MG tablet take 1 tablet by mouth at bedtime if needed for sleep  30 tablet  0   No current facility-administered medications on file prior to visit.    Past Surgical History  Procedure Laterality Date  . Abdominal hysterectomy  1991  . Breast lumpectomy  9/07    Allergies  Allergen Reactions  . Amoxicillin-Pot Clavulanate Nausea And Vomiting  . Moxifloxacin     History   Social History  . Marital Status:  Married    Spouse Name: N/A    Number of Children: N/A  . Years of Education: N/A   Occupational History  .  [employer]    retired   Social History Main Topics  . Smoking status: Former Research scientist (life sciences)  . Smokeless tobacco: Former Systems developer    Quit date: 08/26/2012  . Alcohol Use: No  . Drug Use: No  . Sexual Activity: Not on file   Other Topics Concern  . Not on file   Social History Narrative  . No narrative on file    Family History  Problem Relation Age of Onset  . Colon cancer    . Breast cancer    . Lung cancer    . Diabetes Maternal Grandmother   . Heart failure Mother     CHF  . Hyperlipidemia Mother   . Heart attack Neg Hx   . Hypertension Neg Hx   . Sudden death Neg Hx     BP 157/90  Pulse 66  Ht 5\' 4"  (1.626 m)  Wt 118 lb (53.524 kg)  BMI 20.24 kg/m2  Review of Systems: See HPI above.    Objective:  Physical Exam:  Gen: NAD  Right ankle/foot: No gross deformity, swelling, ecchymoses Mild overpronation with transverse arch collapse, bunion formation. FROM with pain on IR. 5/5 strength. TTP posterior to medial malleolus. Negative ant drawer and talar tilt.   Negative syndesmotic compression. Thompsons test negative. NV intact distally.    Assessment & Plan:  1. Right ankle pain - 2/2 posterior tibialis tendinopathy - Start home exercise program which was reviewed today.  Better arch support.  Icing, nsaids, avoid barefoot walking.  Consider PT, nitro, custom orthotics if not improving as expected.  F/u in 1 month or prn.

## 2014-02-10 NOTE — Assessment & Plan Note (Signed)
2/2 posterior tibialis tendinopathy - Start home exercise program which was reviewed today.  Better arch support.  Icing, nsaids, avoid barefoot walking.  Consider PT, nitro, custom orthotics if not improving as expected.  F/u in 1 month or prn.

## 2014-02-26 ENCOUNTER — Encounter: Payer: Self-pay | Admitting: Family Medicine

## 2014-03-03 ENCOUNTER — Ambulatory Visit (INDEPENDENT_AMBULATORY_CARE_PROVIDER_SITE_OTHER): Payer: Medicare Other | Admitting: Family Medicine

## 2014-03-03 ENCOUNTER — Encounter: Payer: Self-pay | Admitting: Family Medicine

## 2014-03-03 VITALS — BP 120/70 | HR 73 | Temp 97.7°F | Wt 117.0 lb

## 2014-03-03 DIAGNOSIS — E785 Hyperlipidemia, unspecified: Secondary | ICD-10-CM

## 2014-03-03 DIAGNOSIS — Z23 Encounter for immunization: Secondary | ICD-10-CM

## 2014-03-03 LAB — LIPID PANEL
CHOL/HDL RATIO: 5
CHOLESTEROL: 258 mg/dL — AB (ref 0–200)
HDL: 49.2 mg/dL (ref >39.00)
LDL Cholesterol: 194 mg/dL — ABNORMAL HIGH (ref 0–99)
TRIGLYCERIDES: 76 mg/dL (ref 0.0–149.0)
VLDL: 15.2 mg/dL (ref 0.0–40.0)

## 2014-03-03 LAB — HEPATIC FUNCTION PANEL
ALT: 17 U/L (ref 0–35)
AST: 24 U/L (ref 0–37)
Albumin: 3.9 g/dL (ref 3.5–5.2)
Alkaline Phosphatase: 49 U/L (ref 39–117)
Bilirubin, Direct: 0 mg/dL (ref 0.0–0.3)
Total Bilirubin: 0.4 mg/dL (ref 0.3–1.2)
Total Protein: 7.7 g/dL (ref 6.0–8.3)

## 2014-03-03 LAB — BASIC METABOLIC PANEL
BUN: 19 mg/dL (ref 6–23)
CO2: 24 mEq/L (ref 19–32)
CREATININE: 0.8 mg/dL (ref 0.4–1.2)
Calcium: 9.3 mg/dL (ref 8.4–10.5)
Chloride: 103 mEq/L (ref 96–112)
GFR: 79.48 mL/min (ref >60.00)
Glucose, Bld: 76 mg/dL (ref 70–99)
POTASSIUM: 4.1 meq/L (ref 3.5–5.1)
Sodium: 138 mEq/L (ref 135–145)

## 2014-03-03 NOTE — Progress Notes (Signed)
   Subjective:    Patient ID: Samantha Miranda, female    DOB: 01/26/1946, 68 y.o.   MRN: [id number]  Hyperlipidemia  pt here to recheck cholesterol --- no complaints.   No cp, sob.     Review of Systems    as above Objective:   Physical Exam  Constitutional: She is oriented to person, place, and time. She appears well-developed and well-nourished. No distress.  HENT:  Head: Normocephalic and atraumatic.  Eyes: Conjunctivae and EOM are normal. Pupils are equal, round, and reactive to light.  Neck: Normal range of motion. Neck supple. No thyromegaly present.  Cardiovascular: Normal rate, regular rhythm, normal heart sounds and intact distal pulses.   No murmur heard. Pulmonary/Chest: Effort normal and breath sounds normal. No respiratory distress.  Abdominal: Soft. She exhibits no distension. There is no tenderness.  Musculoskeletal: She exhibits no edema.  Lymphadenopathy:    She has no cervical adenopathy.  Neurological: She is alert and oriented to person, place, and time.  Skin: Skin is warm and dry.  Psychiatric: She has a normal mood and affect. Her behavior is normal.          Assessment & Plan:  1. Other and unspecified hyperlipidemia Check labs, con't meds - Basic metabolic panel - Hepatic function panel - Lipid panel

## 2014-03-03 NOTE — Progress Notes (Signed)
Pre visit review using our clinic review tool, if applicable. No additional management support is needed unless otherwise documented below in the visit note. 

## 2014-03-03 NOTE — Patient Instructions (Signed)

## 2014-03-25 ENCOUNTER — Encounter: Payer: Self-pay | Admitting: Family Medicine

## 2014-03-25 ENCOUNTER — Ambulatory Visit (INDEPENDENT_AMBULATORY_CARE_PROVIDER_SITE_OTHER): Payer: Medicare Other | Admitting: Family Medicine

## 2014-03-25 VITALS — BP 118/72 | HR 71 | Ht 64.0 in | Wt 118.0 lb

## 2014-03-25 DIAGNOSIS — M25519 Pain in unspecified shoulder: Secondary | ICD-10-CM

## 2014-03-25 DIAGNOSIS — M25512 Pain in left shoulder: Secondary | ICD-10-CM

## 2014-03-25 MED ORDER — METHYLPREDNISOLONE ACETATE 40 MG/ML IJ SUSP
40.0000 mg | Freq: Once | INTRAMUSCULAR | Status: AC
  Administered 2014-03-25: 40 mg via INTRA_ARTICULAR

## 2014-03-25 NOTE — Patient Instructions (Signed)
You have a frozen shoulder (adhesive capsulitis), a buildup of scar tissue that limits motion of the shoulder joint. Limit lifting and overhead activities as much as possible. Heat 15 minutes at a time 3-4 times a day may help with movement and stiffness. Tylenol as needed for pain and inflammation. Call if you need something stronger. Steroid injections in a series have been shown to help with pain and motion - we did the first of these today. Codman exercises (pendulum, wall walking or table slides, arm circles) - do 3 sets of 10 daily of each of these. Physical therapy for rotator cuff strengthening is a consideration once you are out of the painful phase Follow up in 6 weeks.

## 2014-03-26 ENCOUNTER — Encounter: Payer: Self-pay | Admitting: Family Medicine

## 2014-03-26 NOTE — Progress Notes (Signed)
Patient ID: Samantha Miranda, female   DOB: 02-Dec-1945, 68 y.o.   MRN: [id number]  PCP: Garnet Koyanagi, DO  Subjective:   HPI: Patient is a 68 y.o. female here for left shoulder pain.  Patient reports left shoulder pain restarted about 1 week ago. Has become constant. Taking tramadol and tylenol. No new injury. Pain with all motions of this shoulder. Motion becoming more limited. No swelling, bruising. Radiates to upper arm, up to neck. No numbness/tingling.  Past Medical History  Diagnosis Date  . Cancer     Brest cancer  . Hyperlipidemia   . Osteopenia     Current Outpatient Prescriptions on File Prior to Visit  Medication Sig Dispense Refill  . Cholecalciferol (VITAMIN D) 2000 UNITS tablet Take 4,000 Units by mouth daily.      . Coenzyme Q10 (COQ10 PO) Take 1 each by mouth daily.      Marland Kitchen ezetimibe (ZETIA) 10 MG tablet Take 1 tablet (10 mg total) by mouth daily.  30 tablet  2  . FLUoxetine (PROZAC) 10 MG tablet take 1 tablet by mouth once daily  30 tablet  5  . fluticasone (FLONASE) 50 MCG/ACT nasal spray Place 2 sprays into the nose daily.  16 g  6  . hydrocortisone-pramoxine (ANALPRAM-HC) 2.5-1 % rectal cream apply to affected area rectally three times a day  30 g  1  . MAGNESIUM PO Take 1 each by mouth daily.      . Probiotic Product (PROBIOTIC DAILY PO) Take 1 tablet by mouth daily.      Marland Kitchen zolpidem (AMBIEN) 5 MG tablet take 1 tablet by mouth at bedtime if needed for sleep  30 tablet  0   No current facility-administered medications on file prior to visit.    Past Surgical History  Procedure Laterality Date  . Abdominal hysterectomy  1991  . Breast lumpectomy  9/07  . Cataract extraction, bilateral      Allergies  Allergen Reactions  . Amoxicillin-Pot Clavulanate Nausea And Vomiting  . Moxifloxacin     History   Social History  . Marital Status: Married    Spouse Name: N/A    Number of Children: N/A  . Years of Education: N/A   Occupational History  .  [employer]    retired   Social History Main Topics  . Smoking status: Former Research scientist (life sciences)  . Smokeless tobacco: Former Systems developer    Quit date: 08/26/2012  . Alcohol Use: No  . Drug Use: No  . Sexual Activity: Not on file   Other Topics Concern  . Not on file   Social History Narrative  . No narrative on file    Family History  Problem Relation Age of Onset  . Colon cancer    . Breast cancer    . Lung cancer    . Diabetes Maternal Grandmother   . Heart failure Mother     CHF  . Hyperlipidemia Mother   . Heart attack Neg Hx   . Hypertension Neg Hx   . Sudden death Neg Hx   . Lung cancer Father 28    lung  . Cancer Sister 18    breast    BP 118/72  Pulse 71  Ht 5\' 4"  (1.626 m)  Wt 118 lb (53.524 kg)  BMI 20.24 kg/m2  Review of Systems: See HPI above.    Objective:  Physical Exam:  Gen: NAD  Left shoulder: No swelling, ecchymoses.  No gross deformity. TTP  anterior, posterior shoulder joint. ER to 30 degrees, abduction and flexion to 70 degrees, painful. Positive Hawkins, Neers. Negative Yergasons. Strength 5/5 with empty can and resisted internal/external rotation though all painful and empty can not in exact position when tested. NV intact distally.    Assessment & Plan:  1. Left shoulder pain - 2/2 adhesive capsulitis, rotator cuff tendinitis.  Given combination injection today.  Start codman exercises.  Heat regularly.  Tylenol for pain.  F/u in 6 weeks - consider second intraarticular injection.  After informed written consent, patient was seated on exam table. Left shoulder was prepped with alcohol swab and utilizing posterior approach, patient's left shoulder was injected with 6:2 marcaine:depomedrol with half in the subacromial space and half in glenohumeral space.  Patient tolerated the procedure well without immediate complications.

## 2014-03-26 NOTE — Assessment & Plan Note (Signed)
2/2 adhesive capsulitis, rotator cuff tendinitis.  Given combination injection today.  Start codman exercises.  Heat regularly.  Tylenol for pain.  F/u in 6 weeks - consider second intraarticular injection.  After informed written consent, patient was seated on exam table. Left shoulder was prepped with alcohol swab and utilizing posterior approach, patient's left shoulder was injected with 6:2 marcaine:depomedrol with half in the subacromial space and half in glenohumeral space.  Patient tolerated the procedure well without immediate complications.

## 2014-04-11 ENCOUNTER — Encounter: Payer: Self-pay | Admitting: Family Medicine

## 2014-04-11 ENCOUNTER — Ambulatory Visit (INDEPENDENT_AMBULATORY_CARE_PROVIDER_SITE_OTHER): Payer: Medicare Other | Admitting: Family Medicine

## 2014-04-11 VITALS — BP 112/72 | HR 80 | Temp 98.2°F | Ht 63.0 in | Wt 116.4 lb

## 2014-04-11 DIAGNOSIS — E785 Hyperlipidemia, unspecified: Secondary | ICD-10-CM

## 2014-04-11 DIAGNOSIS — F3289 Other specified depressive episodes: Secondary | ICD-10-CM

## 2014-04-11 DIAGNOSIS — F329 Major depressive disorder, single episode, unspecified: Secondary | ICD-10-CM

## 2014-04-11 DIAGNOSIS — F32A Depression, unspecified: Secondary | ICD-10-CM

## 2014-04-11 DIAGNOSIS — Z0000 Encounter for general adult medical examination without abnormal findings: Secondary | ICD-10-CM

## 2014-04-11 LAB — HEPATIC FUNCTION PANEL
ALT: 22 U/L (ref 0–35)
AST: 30 U/L (ref 0–37)
Albumin: 3.9 g/dL (ref 3.5–5.2)
Alkaline Phosphatase: 50 U/L (ref 39–117)
BILIRUBIN TOTAL: 0.6 mg/dL (ref 0.2–1.2)
Bilirubin, Direct: 0 mg/dL (ref 0.0–0.3)
Total Protein: 7.4 g/dL (ref 6.0–8.3)

## 2014-04-11 LAB — LIPID PANEL
CHOL/HDL RATIO: 4
Cholesterol: 257 mg/dL — ABNORMAL HIGH (ref 0–200)
HDL: 62.1 mg/dL (ref >39.00)
LDL CALC: 184 mg/dL — AB (ref 0–99)
Triglycerides: 54 mg/dL (ref 0.0–149.0)
VLDL: 10.8 mg/dL (ref 0.0–40.0)

## 2014-04-11 LAB — BASIC METABOLIC PANEL
BUN: 15 mg/dL (ref 6–23)
CO2: 29 mEq/L (ref 19–32)
Calcium: 9.3 mg/dL (ref 8.4–10.5)
Chloride: 100 mEq/L (ref 96–112)
Creatinine, Ser: 0.9 mg/dL (ref 0.4–1.2)
GFR: 70.88 mL/min (ref >60.00)
Glucose, Bld: 68 mg/dL — ABNORMAL LOW (ref 70–99)
Potassium: 3.8 mEq/L (ref 3.5–5.1)
Sodium: 138 mEq/L (ref 135–145)

## 2014-04-11 LAB — CBC WITH DIFFERENTIAL/PLATELET
BASOS PCT: 1 % (ref 0.0–3.0)
Basophils Absolute: 0.1 10*3/uL (ref 0.0–0.1)
EOS PCT: 5.3 % — AB (ref 0.0–5.0)
Eosinophils Absolute: 0.3 10*3/uL (ref 0.0–0.7)
HCT: 40.2 % (ref 36.0–46.0)
Hemoglobin: 13.7 g/dL (ref 12.0–15.0)
LYMPHS ABS: 1.7 10*3/uL (ref 0.7–4.0)
Lymphocytes Relative: 29 % (ref 12.0–46.0)
MCHC: 34 g/dL (ref 30.0–36.0)
MCV: 90.4 fl (ref 78.0–100.0)
Monocytes Absolute: 0.5 10*3/uL (ref 0.1–1.0)
Monocytes Relative: 8 % (ref 3.0–12.0)
NEUTROS PCT: 56.7 % (ref 43.0–77.0)
Neutro Abs: 3.2 10*3/uL (ref 1.4–7.7)
PLATELETS: 215 10*3/uL (ref 150.0–400.0)
RBC: 4.45 Mil/uL (ref 3.87–5.11)
RDW: 13.5 % (ref 11.5–15.5)
WBC: 5.7 10*3/uL (ref 4.0–10.5)

## 2014-04-11 NOTE — Patient Instructions (Signed)
Preventive Care for Adults, Female A healthy lifestyle and preventive care can promote health and wellness. Preventive health guidelines for women include the following key practices.  A routine yearly physical is a good way to check with your health care provider about your health and preventive screening. It is a chance to share any concerns and updates on your health and to receive a thorough exam.  Visit your dentist for a routine exam and preventive care every 6 months. Brush your teeth twice a day and floss once a day. Good oral hygiene prevents tooth decay and gum disease.  The frequency of eye exams is based on your age, health, family medical history, use of contact lenses, and other factors. Follow your health care provider's recommendations for frequency of eye exams.  Eat a healthy diet. Foods like vegetables, fruits, whole grains, low-fat dairy products, and lean protein foods contain the nutrients you need without too many calories. Decrease your intake of foods high in solid fats, added sugars, and salt. Eat the right amount of calories for you.Get information about a proper diet from your health care provider, if necessary.  Regular physical exercise is one of the most important things you can do for your health. Most adults should get at least 150 minutes of moderate-intensity exercise (any activity that increases your heart rate and causes you to sweat) each week. In addition, most adults need muscle-strengthening exercises on 2 or more days a week.  Maintain a healthy weight. The body mass index (BMI) is a screening tool to identify possible weight problems. It provides an estimate of body fat based on height and weight. Your health care provider can find your BMI, and can help you achieve or maintain a healthy weight.For adults 20 years and older:  A BMI below 18.5 is considered underweight.  A BMI of 18.5 to 24.9 is normal.  A BMI of 25 to 29.9 is considered overweight.  A  BMI of 30 and above is considered obese.  Maintain normal blood lipids and cholesterol levels by exercising and minimizing your intake of saturated fat. Eat a balanced diet with plenty of fruit and vegetables. Blood tests for lipids and cholesterol should begin at age 62 and be repeated every 5 years. If your lipid or cholesterol levels are high, you are over 50, or you are at high risk for heart disease, you may need your cholesterol levels checked more frequently.Ongoing high lipid and cholesterol levels should be treated with medicines if diet and exercise are not working.  If you smoke, find out from your health care provider how to quit. If you do not use tobacco, do not start.  Lung cancer screening is recommended for adults aged 36 80 years who are at high risk for developing lung cancer because of a history of smoking. A yearly low-dose CT scan of the lungs is recommended for people who have at least a 30-pack-year history of smoking and are a current smoker or have quit within the past 15 years. A pack year of smoking is smoking an average of 1 pack of cigarettes a day for 1 year (for example: 1 pack a day for 30 years or 2 packs a day for 15 years). Yearly screening should continue until the smoker has stopped smoking for at least 15 years. Yearly screening should be stopped for people who develop a health problem that would prevent them from having lung cancer treatment.  If you are pregnant, do not drink alcohol. If you  are breastfeeding, be very cautious about drinking alcohol. If you are not pregnant and choose to drink alcohol, do not have more than 1 drink per day. One drink is considered to be 12 ounces (355 mL) of beer, 5 ounces (148 mL) of wine, or 1.5 ounces (44 mL) of liquor.  Avoid use of street drugs. Do not share needles with anyone. Ask for help if you need support or instructions about stopping the use of drugs.  High blood pressure causes heart disease and increases the risk  of stroke. Your blood pressure should be checked at least every 1 to 2 years. Ongoing high blood pressure should be treated with medicines if weight loss and exercise do not work.  If you are 39 67 years old, ask your health care provider if you should take aspirin to prevent strokes.  Diabetes screening involves taking a blood sample to check your fasting blood sugar level. This should be done once every 3 years, after age 56, if you are within normal weight and without risk factors for diabetes. Testing should be considered at a younger age or be carried out more frequently if you are overweight and have at least 1 risk factor for diabetes.  Breast cancer screening is essential preventive care for women. You should practice "breast self-awareness." This means understanding the normal appearance and feel of your breasts and may include breast self-examination. Any changes detected, no matter how small, should be reported to a health care provider. Women in their 40s and 30s should have a clinical breast exam (CBE) by a health care provider as part of a regular health exam every 1 to 3 years. After age 28, women should have a CBE every year. Starting at age 72, women should consider having a mammogram (breast X-ray test) every year. Women who have a family history of breast cancer should talk to their health care provider about genetic screening. Women at a high risk of breast cancer should talk to their health care providers about having an MRI and a mammogram every year.  Breast cancer gene (BRCA)-related cancer risk assessment is recommended for women who have family members with BRCA-related cancers. BRCA-related cancers include breast, ovarian, tubal, and peritoneal cancers. Having family members with these cancers may be associated with an increased risk for harmful changes (mutations) in the breast cancer genes BRCA1 and BRCA2. Results of the assessment will determine the need for genetic counseling  and BRCA1 and BRCA2 testing.  The Pap test is a screening test for cervical cancer. A Pap test can show cell changes on the cervix that might become cervical cancer if left untreated. A Pap test is a procedure in which cells are obtained and examined from the lower end of the uterus (cervix).  Women should have a Pap test starting at age 59.  Between ages 42 and 13, Pap tests should be repeated every 2 years.  Beginning at age 53, you should have a Pap test every 3 years as long as the past 3 Pap tests have been normal.  Some women have medical problems that increase the chance of getting cervical cancer. Talk to your health care provider about these problems. It is especially important to talk to your health care provider if a new problem develops soon after your last Pap test. In these cases, your health care provider may recommend more frequent screening and Pap tests.  The above recommendations are the same for women who have or have not gotten the vaccine  for human papillomavirus (HPV).  If you had a hysterectomy for a problem that was not cancer or a condition that could lead to cancer, then you no longer need Pap tests. Even if you no longer need a Pap test, a regular exam is a good idea to make sure no other problems are starting.  If you are between ages 58 and 10 years, and you have had normal Pap tests going back 10 years, you no longer need Pap tests. Even if you no longer need a Pap test, a regular exam is a good idea to make sure no other problems are starting.  If you have had past treatment for cervical cancer or a condition that could lead to cancer, you need Pap tests and screening for cancer for at least 20 years after your treatment.  If Pap tests have been discontinued, risk factors (such as a new sexual partner) need to be reassessed to determine if screening should be resumed.  The HPV test is an additional test that may be used for cervical cancer screening. The HPV test  looks for the virus that can cause the cell changes on the cervix. The cells collected during the Pap test can be tested for HPV. The HPV test could be used to screen women aged 67 years and older, and should be used in women of any age who have unclear Pap test results. After the age of 65, women should have HPV testing at the same frequency as a Pap test.  Colorectal cancer can be detected and often prevented. Most routine colorectal cancer screening begins at the age of 25 years and continues through age 66 years. However, your health care provider may recommend screening at an earlier age if you have risk factors for colon cancer. On a yearly basis, your health care provider may provide home test kits to check for hidden blood in the stool. Use of a small camera at the end of a tube, to directly examine the colon (sigmoidoscopy or colonoscopy), can detect the earliest forms of colorectal cancer. Talk to your health care provider about this at age 79, when routine screening begins. Direct exam of the colon should be repeated every 5 10 years through age 47 years, unless early forms of pre-cancerous polyps or small growths are found.  People who are at an increased risk for hepatitis B should be screened for this virus. You are considered at high risk for hepatitis B if:  You were born in a country where hepatitis B occurs often. Talk with your health care provider about which countries are considered high risk.  Your parents were born in a high-risk country and you have not received a shot to protect against hepatitis B (hepatitis B vaccine).  You have HIV or AIDS.  You use needles to inject street drugs.  You live with, or have sex with, someone who has Hepatitis B.  You get hemodialysis treatment.  You take certain medicines for conditions like cancer, organ transplantation, and autoimmune conditions.  Hepatitis C blood testing is recommended for all people born from [birth year] through 1965 and  any individual with known risks for hepatitis C.  Practice safe sex. Use condoms and avoid high-risk sexual practices to reduce the spread of sexually transmitted infections (STIs). STIs include gonorrhea, chlamydia, syphilis, trichomonas, herpes, HPV, and human immunodeficiency virus (HIV). Herpes, HIV, and HPV are viral illnesses that have no cure. They can result in disability, cancer, and death. Sexually active women aged 66  years and younger should be checked for chlamydia. Older women with new or multiple partners should also be tested for chlamydia. Testing for other STIs is recommended if you are sexually active and at increased risk.  Osteoporosis is a disease in which the bones lose minerals and strength with aging. This can result in serious bone fractures or breaks. The risk of osteoporosis can be identified using a bone density scan. Women ages 18 years and over and women at risk for fractures or osteoporosis should discuss screening with their health care providers. Ask your health care provider whether you should take a calcium supplement or vitamin D to reduce the rate of osteoporosis.  Menopause can be associated with physical symptoms and risks. Hormone replacement therapy is available to decrease symptoms and risks. You should talk to your health care provider about whether hormone replacement therapy is right for you.  Use sunscreen. Apply sunscreen liberally and repeatedly throughout the day. You should seek shade when your shadow is shorter than you. Protect yourself by wearing long sleeves, pants, a wide-brimmed hat, and sunglasses year round, whenever you are outdoors.  Once a month, do a whole body skin exam, using a mirror to look at the skin on your back. Tell your health care provider of new moles, moles that have irregular borders, moles that are larger than a pencil eraser, or moles that have changed in shape or color.  Stay current with required vaccines  (immunizations).  Influenza vaccine. All adults should be immunized every year.  Tetanus, diphtheria, and acellular pertussis (Td, Tdap) vaccine. Pregnant women should receive 1 dose of Tdap vaccine during each pregnancy. The dose should be obtained regardless of the length of time since the last dose. Immunization is preferred during the 27th 36th week of gestation. An adult who has not previously received Tdap or who does not know her vaccine status should receive 1 dose of Tdap. This initial dose should be followed by tetanus and diphtheria toxoids (Td) booster doses every 10 years. Adults with an unknown or incomplete history of completing a 3-dose immunization series with Td-containing vaccines should begin or complete a primary immunization series including a Tdap dose. Adults should receive a Td booster every 10 years.  Varicella vaccine. An adult without evidence of immunity to varicella should receive 2 doses or a second dose if she has previously received 1 dose. Pregnant females who do not have evidence of immunity should receive the first dose after pregnancy. This first dose should be obtained before leaving the health care facility. The second dose should be obtained 4 8 weeks after the first dose.  Human papillomavirus (HPV) vaccine. Females aged 9 26 years who have not received the vaccine previously should obtain the 3-dose series. The vaccine is not recommended for use in pregnant females. However, pregnancy testing is not needed before receiving a dose. If a female is found to be pregnant after receiving a dose, no treatment is needed. In that case, the remaining doses should be delayed until after the pregnancy. Immunization is recommended for any person with an immunocompromised condition through the age of 51 years if she did not get any or all doses earlier. During the 3-dose series, the second dose should be obtained 4 8 weeks after the first dose. The third dose should be obtained  24 weeks after the first dose and 16 weeks after the second dose.  Zoster vaccine. One dose is recommended for adults aged 57 years or older unless certain  conditions are present.  Measles, mumps, and rubella (MMR) vaccine. Adults born before [birth year] generally are considered immune to measles and mumps. Adults born in [birth year] or later should have 1 or more doses of MMR vaccine unless there is a contraindication to the vaccine or there is laboratory evidence of immunity to each of the three diseases. A routine second dose of MMR vaccine should be obtained at least 28 days after the first dose for students attending postsecondary schools, health care workers, or international travelers. People who received inactivated measles vaccine or an unknown type of measles vaccine during 1963 1967 should receive 2 doses of MMR vaccine. People who received inactivated mumps vaccine or an unknown type of mumps vaccine before 1979 and are at high risk for mumps infection should consider immunization with 2 doses of MMR vaccine. For females of childbearing age, rubella immunity should be determined. If there is no evidence of immunity, females who are not pregnant should be vaccinated. If there is no evidence of immunity, females who are pregnant should delay immunization until after pregnancy. Unvaccinated health care workers born before [birth year] who lack laboratory evidence of measles, mumps, or rubella immunity or laboratory confirmation of disease should consider measles and mumps immunization with 2 doses of MMR vaccine or rubella immunization with 1 dose of MMR vaccine.  Pneumococcal 13-valent conjugate (PCV13) vaccine. When indicated, a person who is uncertain of her immunization history and has no record of immunization should receive the PCV13 vaccine. An adult aged 39 years or older who has certain medical conditions and has not been previously immunized should receive 1 dose of PCV13 vaccine. This PCV13 should be followed  with a dose of pneumococcal polysaccharide (PPSV23) vaccine. The PPSV23 vaccine dose should be obtained at least 8 weeks after the dose of PCV13 vaccine. An adult aged 14 years or older who has certain medical conditions and previously received 1 or more doses of PPSV23 vaccine should receive 1 dose of PCV13. The PCV13 vaccine dose should be obtained 1 or more years after the last PPSV23 vaccine dose.  Pneumococcal polysaccharide (PPSV23) vaccine. When PCV13 is also indicated, PCV13 should be obtained first. All adults aged 81 years and older should be immunized. An adult younger than age 29 years who has certain medical conditions should be immunized. Any person who resides in a nursing home or long-term care facility should be immunized. An adult smoker should be immunized. People with an immunocompromised condition and certain other conditions should receive both PCV13 and PPSV23 vaccines. People with human immunodeficiency virus (HIV) infection should be immunized as soon as possible after diagnosis. Immunization during chemotherapy or radiation therapy should be avoided. Routine use of PPSV23 vaccine is not recommended for American Indians, [place] Natives, or people younger than 65 years unless there are medical conditions that require PPSV23 vaccine. When indicated, people who have unknown immunization and have no record of immunization should receive PPSV23 vaccine. One-time revaccination 5 years after the first dose of PPSV23 is recommended for people aged 28 64 years who have chronic kidney failure, nephrotic syndrome, asplenia, or immunocompromised conditions. People who received 1 2 doses of PPSV23 before age 27 years should receive another dose of PPSV23 vaccine at age 78 years or later if at least 5 years have passed since the previous dose. Doses of PPSV23 are not needed for people immunized with PPSV23 at or after age 75 years.  Meningococcal vaccine. Adults with asplenia or persistent complement  component deficiencies should receive 2  doses of quadrivalent meningococcal conjugate (MenACWY-D) vaccine. The doses should be obtained at least 2 months apart. Microbiologists working with certain meningococcal bacteria, [employer] recruits, people at risk during an outbreak, and people who travel to or live in countries with a high rate of meningitis should be immunized. A first-year college student up through age 47 years who is living in a residence hall should receive a dose if she did not receive a dose on or after her 16th birthday. Adults who have certain high-risk conditions should receive one or more doses of vaccine.  Hepatitis A vaccine. Adults who wish to be protected from this disease, have certain high-risk conditions, work with hepatitis A-infected animals, work in hepatitis A research labs, or travel to or work in countries with a high rate of hepatitis A should be immunized. Adults who were previously unvaccinated and who anticipate close contact with an international adoptee during the first 60 days after arrival in the [place] from a country with a high rate of hepatitis A should be immunized.  Hepatitis B vaccine. Adults who wish to be protected from this disease, have certain high-risk conditions, may be exposed to blood or other infectious body fluids, are household contacts or sex partners of hepatitis B positive people, are clients or workers in certain care facilities, or travel to or work in countries with a high rate of hepatitis B should be immunized.  Haemophilus influenzae type b (Hib) vaccine. A previously unvaccinated person with asplenia or sickle cell disease or having a scheduled splenectomy should receive 1 dose of Hib vaccine. Regardless of previous immunization, a recipient of a hematopoietic stem cell transplant should receive a 3-dose series 6 12 months after her successful transplant. Hib vaccine is not recommended for adults with HIV infection. Preventive  Services / Frequency Ages 21 to 39years  Blood pressure check.** / Every 1 to 2 years.  Lipid and cholesterol check.** / Every 5 years beginning at age 108.  Clinical breast exam.** / Every 3 years for women in their 38s and 78s.  BRCA-related cancer risk assessment.** / For women who have family members with a BRCA-related cancer (breast, ovarian, tubal, or peritoneal cancers).  Pap test.** / Every 2 years from ages 71 through 45. Every 3 years starting at age 67 through age 71 or 28 with a history of 3 consecutive normal Pap tests.  HPV screening.** / Every 3 years from ages 38 through ages 41 to 60 with a history of 3 consecutive normal Pap tests.  Hepatitis C blood test.** / For any individual with known risks for hepatitis C.  Skin self-exam. / Monthly.  Influenza vaccine. / Every year.  Tetanus, diphtheria, and acellular pertussis (Tdap, Td) vaccine.** / Consult your health care provider. Pregnant women should receive 1 dose of Tdap vaccine during each pregnancy. 1 dose of Td every 10 years.  Varicella vaccine.** / Consult your health care provider. Pregnant females who do not have evidence of immunity should receive the first dose after pregnancy.  HPV vaccine. / 3 doses over 6 months, if 14 and younger. The vaccine is not recommended for use in pregnant females. However, pregnancy testing is not needed before receiving a dose.  Measles, mumps, rubella (MMR) vaccine.** / You need at least 1 dose of MMR if you were born in 1957 or later. You may also need a 2nd dose. For females of childbearing age, rubella immunity should be determined. If there is no evidence of immunity, females who are not  pregnant should be vaccinated. If there is no evidence of immunity, females who are pregnant should delay immunization until after pregnancy.  Pneumococcal 13-valent conjugate (PCV13) vaccine.** / Consult your health care provider.  Pneumococcal polysaccharide (PPSV23) vaccine.** / 1 to 2  doses if you smoke cigarettes or if you have certain conditions.  Meningococcal vaccine.** / 1 dose if you are age 88 to 6 years and a Market researcher living in a residence hall, or have one of several medical conditions, you need to get vaccinated against meningococcal disease. You may also need additional booster doses.  Hepatitis A vaccine.** / Consult your health care provider.  Hepatitis B vaccine.** / Consult your health care provider.  Haemophilus influenzae type b (Hib) vaccine.** / Consult your health care provider. Ages 23 to 64years  Blood pressure check.** / Every 1 to 2 years.  Lipid and cholesterol check.** / Every 5 years beginning at age 20 years.  Lung cancer screening. / Every year if you are aged 51 80 years and have a 30-pack-year history of smoking and currently smoke or have quit within the past 15 years. Yearly screening is stopped once you have quit smoking for at least 15 years or develop a health problem that would prevent you from having lung cancer treatment.  Clinical breast exam.** / Every year after age 8 years.  BRCA-related cancer risk assessment.** / For women who have family members with a BRCA-related cancer (breast, ovarian, tubal, or peritoneal cancers).  Mammogram.** / Every year beginning at age 10 years and continuing for as long as you are in good health. Consult with your health care provider.  Pap test.** / Every 3 years starting at age 30 years through age 5 or 61 years with a history of 3 consecutive normal Pap tests.  HPV screening.** / Every 3 years from ages 39 years through ages 72 to 19 years with a history of 3 consecutive normal Pap tests.  Fecal occult blood test (FOBT) of stool. / Every year beginning at age 59 years and continuing until age 27 years. You may not need to do this test if you get a colonoscopy every 10 years.  Flexible sigmoidoscopy or colonoscopy.** / Every 5 years for a flexible sigmoidoscopy or every  10 years for a colonoscopy beginning at age 110 years and continuing until age 63 years.  Hepatitis C blood test.** / For all people born from [birth year] through 1964 and any individual with known risks for hepatitis C.  Skin self-exam. / Monthly.  Influenza vaccine. / Every year.  Tetanus, diphtheria, and acellular pertussis (Tdap/Td) vaccine.** / Consult your health care provider. Pregnant women should receive 1 dose of Tdap vaccine during each pregnancy. 1 dose of Td every 10 years.  Varicella vaccine.** / Consult your health care provider. Pregnant females who do not have evidence of immunity should receive the first dose after pregnancy.  Zoster vaccine.** / 1 dose for adults aged 46 years or older.  Measles, mumps, rubella (MMR) vaccine.** / You need at least 1 dose of MMR if you were born in 1956 or later. You may also need a 2nd dose. For females of childbearing age, rubella immunity should be determined. If there is no evidence of immunity, females who are not pregnant should be vaccinated. If there is no evidence of immunity, females who are pregnant should delay immunization until after pregnancy.  Pneumococcal 13-valent conjugate (PCV13) vaccine.** / Consult your health care provider.  Pneumococcal polysaccharide (PPSV23) vaccine.** / 1  to 2 doses if you smoke cigarettes or if you have certain conditions.  Meningococcal vaccine.** / Consult your health care provider.  Hepatitis A vaccine.** / Consult your health care provider.  Hepatitis B vaccine.** / Consult your health care provider.  Haemophilus influenzae type b (Hib) vaccine.** / Consult your health care provider. Ages 3 years and over  Blood pressure check.** / Every 1 to 2 years.  Lipid and cholesterol check.** / Every 5 years beginning at age 51 years.  Lung cancer screening. / Every year if you are aged 54 80 years and have a 30-pack-year history of smoking and currently smoke or have quit within the past 15 years.  Yearly screening is stopped once you have quit smoking for at least 15 years or develop a health problem that would prevent you from having lung cancer treatment.  Clinical breast exam.** / Every year after age 96 years.  BRCA-related cancer risk assessment.** / For women who have family members with a BRCA-related cancer (breast, ovarian, tubal, or peritoneal cancers).  Mammogram.** / Every year beginning at age 88 years and continuing for as long as you are in good health. Consult with your health care provider.  Pap test.** / Every 3 years starting at age 49 years through age 28 or 32 years with 3 consecutive normal Pap tests. Testing can be stopped between 65 and 70 years with 3 consecutive normal Pap tests and no abnormal Pap or HPV tests in the past 10 years.  HPV screening.** / Every 3 years from ages 43 years through ages 50 or 68 years with a history of 3 consecutive normal Pap tests. Testing can be stopped between 65 and 70 years with 3 consecutive normal Pap tests and no abnormal Pap or HPV tests in the past 10 years.  Fecal occult blood test (FOBT) of stool. / Every year beginning at age 81 years and continuing until age 38 years. You may not need to do this test if you get a colonoscopy every 10 years.  Flexible sigmoidoscopy or colonoscopy.** / Every 5 years for a flexible sigmoidoscopy or every 10 years for a colonoscopy beginning at age 82 years and continuing until age 65 years.  Hepatitis C blood test.** / For all people born from [birth year] through 1964 and any individual with known risks for hepatitis C.  Osteoporosis screening.** / A one-time screening for women ages 57 years and over and women at risk for fractures or osteoporosis.  Skin self-exam. / Monthly.  Influenza vaccine. / Every year.  Tetanus, diphtheria, and acellular pertussis (Tdap/Td) vaccine.** / 1 dose of Td every 10 years.  Varicella vaccine.** / Consult your health care provider.  Zoster vaccine.** / 1  dose for adults aged 52 years or older.  Pneumococcal 13-valent conjugate (PCV13) vaccine.** / Consult your health care provider.  Pneumococcal polysaccharide (PPSV23) vaccine.** / 1 dose for all adults aged 4 years and older.  Meningococcal vaccine.** / Consult your health care provider.  Hepatitis A vaccine.** / Consult your health care provider.  Hepatitis B vaccine.** / Consult your health care provider.  Haemophilus influenzae type b (Hib) vaccine.** / Consult your health care provider. ** Family history and personal history of risk and conditions may change your health care provider's recommendations. Document Released: 01/03/2002 Document Revised: 08/28/2013 Document Reviewed: 04/04/2011 [facility] Patient Information 2014 [place], [place].

## 2014-04-11 NOTE — Progress Notes (Signed)
Pre visit review using our clinic review tool, if applicable. No additional management support is needed unless otherwise documented below in the visit note. 

## 2014-04-11 NOTE — Progress Notes (Signed)
Subjective:    Samantha Miranda is a 68 y.o. female who presents for Medicare Annual/Subsequent preventive examination.  Preventive Screening-Counseling & Management  Tobacco History  Smoking status  . Former Smoker  Smokeless tobacco  . Former Systems developer  . Quit date: 08/26/2012     Problems Prior to Visit 1. none  Current Problems (verified) Patient Active Problem List   Diagnosis Date Noted  . Right ankle pain 02/10/2014  . Left shoulder pain 11/22/2013  . Right shoulder pain 08/20/2013  . Near syncope 08/12/2013  . Other malaise and fatigue 08/12/2013  . Rectal itching 07/01/2013  . 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  . GERD (gastroesophageal reflux disease) 08/12/2012  . Former heavy tobacco smoker 08/26/2010  . COLONIC POLYPS, HX OF 08/26/2010  . POSTMENOPAUSAL STATUS 08/26/2010  . POSTMENOPAUSAL BLEEDING 04/13/2009  . HYPERLIPIDEMIA 07/11/2007  . DEPRESSION 07/11/2007  . OSTEOPENIA 07/11/2007  . BREAST CANCER, HX OF 07/11/2007    Medications Prior to Visit Current Outpatient Prescriptions on File Prior to Visit  Medication Sig Dispense Refill  . Cholecalciferol (VITAMIN D) 2000 UNITS tablet Take 4,000 Units by mouth daily.      . Coenzyme Q10 (COQ10 PO) Take 1 each by mouth daily.      Marland Kitchen ezetimibe (ZETIA) 10 MG tablet Take 1 tablet (10 mg total) by mouth daily.  30 tablet  2  . FLUoxetine (PROZAC) 10 MG tablet take 1 tablet by mouth once daily  30 tablet  5  . fluticasone (FLONASE) 50 MCG/ACT nasal spray Place 2 sprays into the nose daily.  16 g  6  . hydrocortisone-pramoxine (ANALPRAM-HC) 2.5-1 % rectal cream apply to affected area rectally three times a day  30 g  1  . MAGNESIUM PO Take 1 each by mouth daily.      . Probiotic Product (PROBIOTIC DAILY PO) Take 1 tablet by mouth daily.      Marland Kitchen zolpidem (AMBIEN) 5 MG tablet take 1 tablet by mouth at bedtime if needed for sleep  30 tablet  0   No current facility-administered medications on file prior to visit.     Current Medications (verified) Current Outpatient Prescriptions  Medication Sig Dispense Refill  . Cholecalciferol (VITAMIN D) 2000 UNITS tablet Take 4,000 Units by mouth daily.      . Coenzyme Q10 (COQ10 PO) Take 1 each by mouth daily.      Marland Kitchen ezetimibe (ZETIA) 10 MG tablet Take 1 tablet (10 mg total) by mouth daily.  30 tablet  2  . FLUoxetine (PROZAC) 10 MG tablet take 1 tablet by mouth once daily  30 tablet  5  . fluticasone (FLONASE) 50 MCG/ACT nasal spray Place 2 sprays into the nose daily.  16 g  6  . hydrocortisone-pramoxine (ANALPRAM-HC) 2.5-1 % rectal cream apply to affected area rectally three times a day  30 g  1  . MAGNESIUM PO Take 1 each by mouth daily.      . Probiotic Product (PROBIOTIC DAILY PO) Take 1 tablet by mouth daily.      Marland Kitchen zolpidem (AMBIEN) 5 MG tablet take 1 tablet by mouth at bedtime if needed for sleep  30 tablet  0   No current facility-administered medications for this visit.     Allergies (verified) Amoxicillin-pot clavulanate and Moxifloxacin   PAST HISTORY  Family History Family History  Problem Relation Age of Onset  . Colon cancer    . Breast cancer    . Lung cancer    . Diabetes  Maternal Grandmother   . Heart failure Mother     CHF  . Hyperlipidemia Mother   . Heart attack Neg Hx   . Hypertension Neg Hx   . Sudden death Neg Hx   . Lung cancer Father 28    lung  . Cancer Sister 77    breast    Social History History  Substance Use Topics  . Smoking status: Former Research scientist (life sciences)  . Smokeless tobacco: Former Systems developer    Quit date: 08/26/2012  . Alcohol Use: No     Are there smokers in your home (other than you)? No  Risk Factors Current exercise habits: The patient does not participate in regular exercise at present.  Dietary issues discussed: na   Cardiac risk factors: advanced age (older than 56 for men, 76 for women), dyslipidemia and sedentary lifestyle.  Depression Screen (Note: if answer to either of the following is "Yes", a  more complete depression screening is indicated)   Over the past two weeks, have you felt down, depressed or hopeless? No  Over the past two weeks, have you felt little interest or pleasure in doing things? No  Have you lost interest or pleasure in daily life? No  Do you often feel hopeless? No  Do you cry easily over simple problems? No  Activities of Daily Living In your present state of health, do you have any difficulty performing the following activities?:  Driving? No Managing money?  No Feeding yourself? No Getting from bed to chair? No Climbing a flight of stairs? No Preparing food and eating?: No Bathing or showering? No Getting dressed: No Getting to the toilet? No Using the toilet:No Moving around from place to place: No In the past year have you fallen or had a near fall?:No   Are you sexually active?  Yes  Do you have more than one partner?  No  Hearing Difficulties: No Do you often ask people to speak up or repeat themselves? No Do you experience ringing or noises in your ears? No Do you have difficulty understanding soft or whispered voices? No   Do you feel that you have a problem with memory? No  Do you often misplace items? No  Do you feel safe at home?  Yes  Cognitive Testing  Alert? Yes  Normal Appearance?Yes  Oriented to person? Yes  Place? Yes   Time? Yes  Recall of three objects?  Yes  Can perform simple calculations? Yes  Displays appropriate judgment?Yes  Can read the correct time from a watch face?Yes   Advanced Directives have been discussed with the patient? Yes  List the Names of Other Physician/Practitioners you currently use: 1.  Dentist-- [facility] 2.  opth-- [facility] 3. [facility] Indicate any recent Medical Services you may have received from other than [facility] providers in the past year (date may be approximate).  Immunization History  Administered Date(s) Administered  . Influenza Split 09/22/2011  . Pneumococcal  Polysaccharide-23 03/03/2014  . Td 08/26/2010  . Zoster 04/02/2009    Screening Tests Health Maintenance  Topic Date Due  . Influenza Vaccine  06/21/2014  . Mammogram  02/12/2016  . Tetanus/tdap  08/26/2020  . Colonoscopy  10/23/2021  . Pneumococcal Polysaccharide Vaccine Age 25 And Over  Completed  . Zostavax  Completed    All answers were reviewed with the patient and necessary referrals were made:  Garnet Koyanagi, DO   04/11/2014   History reviewed:  She  has a past medical history  of Cancer; Hyperlipidemia; and Osteopenia. She  does not have any pertinent problems on file. She  has past surgical history that includes Abdominal hysterectomy (1991); Breast lumpectomy (9/07); Cataract extraction, bilateral; and Eye surgery (08/2013  10/2013). Her family history includes Breast cancer in an other family member; Cancer (age of onset: 5) in her sister; Colon cancer in an other family member; Diabetes in her maternal grandmother; Heart failure in her mother; Hyperlipidemia in her mother; Lung cancer in an other family member; Lung cancer (age of onset: 25) in her father. There is no history of Heart attack, Hypertension, or Sudden death. She  reports that she has quit smoking. She quit smokeless tobacco use about 19 months ago. She reports that she does not drink alcohol or use illicit drugs. She has a current medication list which includes the following prescription(s): vitamin d, coenzyme q10, ezetimibe, fluoxetine, fluticasone, hydrocortisone-pramoxine, magnesium, probiotic product, and zolpidem. Current Outpatient Prescriptions on File Prior to Visit  Medication Sig Dispense Refill  . Cholecalciferol (VITAMIN D) 2000 UNITS tablet Take 4,000 Units by mouth daily.      . Coenzyme Q10 (COQ10 PO) Take 1 each by mouth daily.      Marland Kitchen ezetimibe (ZETIA) 10 MG tablet Take 1 tablet (10 mg total) by mouth daily.  30 tablet  2  . FLUoxetine (PROZAC) 10 MG tablet take 1 tablet by mouth once daily   30 tablet  5  . fluticasone (FLONASE) 50 MCG/ACT nasal spray Place 2 sprays into the nose daily.  16 g  6  . hydrocortisone-pramoxine (ANALPRAM-HC) 2.5-1 % rectal cream apply to affected area rectally three times a day  30 g  1  . MAGNESIUM PO Take 1 each by mouth daily.      . Probiotic Product (PROBIOTIC DAILY PO) Take 1 tablet by mouth daily.      Marland Kitchen zolpidem (AMBIEN) 5 MG tablet take 1 tablet by mouth at bedtime if needed for sleep  30 tablet  0   No current facility-administered medications on file prior to visit.   She is allergic to amoxicillin-pot clavulanate and moxifloxacin.  Review of Systems  Review of Systems  Constitutional: Negative for activity change, appetite change and fatigue.  HENT: Negative for hearing loss, congestion, tinnitus and ear discharge.   Eyes: Negative for visual disturbance (see optho q1y -- vision corrected to 20/20 with glasses).  Respiratory: Negative for cough, chest tightness and shortness of breath.   Cardiovascular: Negative for chest pain, palpitations and leg swelling.  Gastrointestinal: Negative for abdominal pain, diarrhea, constipation and abdominal distention.  Genitourinary: Negative for urgency, frequency, decreased urine volume and difficulty urinating.  Musculoskeletal: Negative for back pain, arthralgias and gait problem.  Skin: Negative for color change, pallor and rash.  Neurological: Negative for dizziness, light-headedness, numbness and headaches.  Hematological: Negative for adenopathy. Does not bruise/bleed easily.  Psychiatric/Behavioral: Negative for suicidal ideas, confusion, sleep disturbance, self-injury, dysphoric mood, decreased concentration and agitation.  Pt is able to read and write and can do all ADLs No risk for falling No abuse/ violence in home      Objective:     Vision by Snellen chart: opth  Body mass index is 20.62 kg/(m^2). BP 112/72  Pulse 80  Temp(Src) 98.2 F (36.8 C) (Oral)  Ht 5\' 3"  (1.6 m)   Wt 116 lb 6.4 oz (52.799 kg)  BMI 20.62 kg/m2  SpO2 96%  BP 112/72  Pulse 80  Temp(Src) 98.2 F (36.8 C) (Oral)  Ht  5\' 3"  (1.6 m)  Wt 116 lb 6.4 oz (52.799 kg)  BMI 20.62 kg/m2  SpO2 96% General appearance: alert, cooperative, appears stated age and no distress Head: Normocephalic, without obvious abnormality, atraumatic Eyes: conjunctivae/corneas clear. PERRL, EOM's intact. Fundi benign. Ears: normal TM's and external ear canals both ears Nose: Nares normal. Septum midline. Mucosa normal. No drainage or sinus tenderness. Throat: lips, mucosa, and tongue normal; teeth and gums normal Neck: no adenopathy, no carotid bruit, no JVD, supple, symmetrical, trachea midline and thyroid not enlarged, symmetric, no tenderness/mass/nodules Back: symmetric, no curvature. ROM normal. No CVA tenderness. Lungs: clear to auscultation bilaterally Breasts: normal appearance, no masses or tenderness Heart: regular rate and rhythm, S1, S2 normal, no murmur, click, rub or gallop Abdomen: soft, non-tender; bowel sounds normal; no masses,  no organomegaly Pelvic: not indicated; status post hysterectomy, negative ROS Extremities: extremities normal, atraumatic, no cyanosis or edema Pulses: 2+ and symmetric Skin: Skin color, texture, turgor normal. No rashes or lesions Lymph nodes: Cervical, supraclavicular, and axillary nodes normal. Neurologic: Alert and oriented X 3, normal strength and tone. Normal symmetric reflexes. Normal coordination and gait Psych--no depression, no anxiety      Assessment:     cpe      Plan:     During the course of the visit the patient was educated and counseled about appropriate screening and preventive services including:    Pneumococcal vaccine   Influenza vaccine  Screening mammography  Bone densitometry screening  Colorectal cancer screening  Glaucoma screening  Advanced directives: has NO advanced directive - not interested in additional  information  Diet review for nutrition referral? Yes ____  Not Indicated _x___   Patient Instructions (the written plan) was given to the patient.  Medicare Attestation I have personally reviewed: The patient's medical and social history Their use of alcohol, tobacco or illicit drugs Their current medications and supplements The patient's functional ability including ADLs,fall risks, home safety risks, cognitive, and hearing and visual impairment Diet and physical activities Evidence for depression or mood disorders  The patient's weight, height, BMI, and visual acuity have been recorded in the chart.  I have made referrals, counseling, and provided education to the patient based on review of the above and I have provided the patient with a written personalized care plan for preventive services.    1. Other and unspecified hyperlipidemia Check labs and con't meds - Basic metabolic panel - CBC with Differential - Hepatic function panel - Lipid panel - POCT urinalysis dipstick  2. Preventative health care    3. Depression  - Basic metabolic panel - CBC with Differential - Hepatic function panel - Lipid panel - POCT urinalysis dipstick   Garnet Koyanagi, DO   04/11/2014

## 2014-04-12 ENCOUNTER — Encounter: Payer: Self-pay | Admitting: Family Medicine

## 2014-05-01 ENCOUNTER — Other Ambulatory Visit: Payer: Self-pay | Admitting: Family Medicine

## 2014-05-06 ENCOUNTER — Encounter: Payer: Self-pay | Admitting: Family Medicine

## 2014-05-06 ENCOUNTER — Ambulatory Visit (INDEPENDENT_AMBULATORY_CARE_PROVIDER_SITE_OTHER): Payer: Medicare Other | Admitting: Family Medicine

## 2014-05-06 VITALS — BP 121/74 | HR 76 | Ht 64.0 in | Wt 116.0 lb

## 2014-05-06 DIAGNOSIS — M25571 Pain in right ankle and joints of right foot: Secondary | ICD-10-CM

## 2014-05-06 DIAGNOSIS — M25519 Pain in unspecified shoulder: Secondary | ICD-10-CM

## 2014-05-06 DIAGNOSIS — M25512 Pain in left shoulder: Secondary | ICD-10-CM

## 2014-05-06 DIAGNOSIS — M25579 Pain in unspecified ankle and joints of unspecified foot: Secondary | ICD-10-CM

## 2014-05-06 NOTE — Patient Instructions (Signed)
Do strengthening exercises for your ankle. Avoid barefoot walking, flat shoes. Arch supports often. Icing 15 minutes at a time 3-4 times a day. Ibuprofen or aleve as needed for pain and inflammation. If you want to do physical therapy, nitro patches, or try custom orthotics, call me. Otherwise follow-up as needed.

## 2014-05-07 ENCOUNTER — Encounter: Payer: Self-pay | Admitting: Family Medicine

## 2014-05-07 NOTE — Progress Notes (Signed)
Patient ID: Samantha Miranda, female   DOB: [birth year], 68 y.o.   MRN: [id number]  PCP: Garnet Koyanagi, DO  Subjective:   HPI: Patient is a 68 y.o. female here for left shoulder pain, right ankle pain  5/5: Patient reports left shoulder pain restarted about 1 week ago. Has become constant. Taking tramadol and tylenol. No new injury. Pain with all motions of this shoulder. Motion becoming more limited. No swelling, bruising. Radiates to upper arm, up to neck. No numbness/tingling.  6/16: Patient states left shoulder pain has completely resolved. Getting some pain, swelling again in medial right ankle. Worse in the morning. No new injuries.  Past Medical History  Diagnosis Date  . Cancer     B[place] cancer  . Hyperlipidemia   . Osteopenia     Current Outpatient Prescriptions on File Prior to Visit  Medication Sig Dispense Refill  . Cholecalciferol (VITAMIN D) 2000 UNITS tablet Take 4,000 Units by mouth daily.      . Coenzyme Q10 (COQ10 PO) Take 1 each by mouth daily.      Marland Kitchen FLUoxetine (PROZAC) 10 MG tablet take 1 tablet by mouth once daily  30 tablet  5  . fluticasone (FLONASE) 50 MCG/ACT nasal spray Place 2 sprays into the nose daily.  16 g  6  . hydrocortisone-pramoxine (ANALPRAM-HC) 2.5-1 % rectal cream apply to affected area rectally three times a day  30 g  1  . MAGNESIUM PO Take 1 each by mouth daily.      . Probiotic Product (PROBIOTIC DAILY PO) Take 1 tablet by mouth daily.      Marland Kitchen ZETIA 10 MG tablet take 1 tablet by mouth once daily  30 tablet  5  . zolpidem (AMBIEN) 5 MG tablet take 1 tablet by mouth at bedtime if needed for sleep  30 tablet  0   No current facility-administered medications on file prior to visit.    Past Surgical History  Procedure Laterality Date  . Abdominal hysterectomy  1991  . Breast lumpectomy  9/07  . Cataract extraction, bilateral    . Eye surgery  08/2013  10/2013    cataracts    Allergies  Allergen Reactions  . Amoxicillin-Pot  Clavulanate Nausea And Vomiting  . Moxifloxacin     History   Social History  . Marital Status: Married    Spouse Name: N/A    Number of Children: N/A  . Years of Education: N/A   Occupational History  .  [employer]    retired   Social History Main Topics  . Smoking status: Former Research scientist (life sciences)  . Smokeless tobacco: Former Systems developer    Quit date: 08/26/2012  . Alcohol Use: No  . Drug Use: No  . Sexual Activity: Not on file   Other Topics Concern  . Not on file   Social History Narrative  . No narrative on file    Family History  Problem Relation Age of Onset  . Colon cancer    . Breast cancer    . Lung cancer    . Diabetes Maternal Grandmother   . Heart failure Mother     CHF  . Hyperlipidemia Mother   . Heart attack Neg Hx   . Hypertension Neg Hx   . Sudden death Neg Hx   . Lung cancer Father 28    lung  . Cancer Sister 19    breast    BP 121/74  Pulse 76  Ht 5\' 4"  (1.626  m)  Wt 116 lb (52.617 kg)  BMI 19.90 kg/m2  Review of Systems: See HPI above.    Objective:  Physical Exam:  Gen: NAD  Left shoulder: No swelling, ecchymoses.  No gross deformity. FROM without pain. Negative Hawkins, Neers. Negative Yergasons. Strength 5/5 with empty can and resisted internal/external rotation without pain. NV intact distally.  Right ankle: Mild medial ankle swelling. FROM with pain on IR and ER. TTP post tib tendon.  No bony, other tenderness. Negative ant drawer and talar tilt.   Negative syndesmotic compression. Thompsons test negative. NV intact distally.    Assessment & Plan:  1. Left shoulder pain - 2/2 adhesive capsulitis, rotator cuff tendinitis.  Resolved following injection, HEP, heat.  2. Right ankle pain - 2/2 post tib tendinopathy.  Restart home exercises, use of arch supports.  Icing, nsaids.  Consider physical therapy, nitro patches, custom orthotics if not improving.  F/u prn.

## 2014-05-07 NOTE — Assessment & Plan Note (Signed)
2/2 adhesive capsulitis, rotator cuff tendinitis.  Resolved following injection, HEP, heat.

## 2014-05-07 NOTE — Assessment & Plan Note (Signed)
2/2 post tib tendinopathy.  Restart home exercises, use of arch supports.  Icing, nsaids.  Consider physical therapy, nitro patches, custom orthotics if not improving.  F/u prn.

## 2014-06-04 ENCOUNTER — Other Ambulatory Visit: Payer: Self-pay | Admitting: Family Medicine

## 2014-08-25 ENCOUNTER — Encounter: Payer: Medicare Other | Admitting: Family Medicine

## 2014-09-09 ENCOUNTER — Ambulatory Visit (INDEPENDENT_AMBULATORY_CARE_PROVIDER_SITE_OTHER): Payer: Medicare Other | Admitting: Medical

## 2014-09-09 ENCOUNTER — Encounter: Payer: Self-pay | Admitting: Medical

## 2014-09-09 VITALS — BP 134/83 | HR 76 | Temp 98.5°F | Ht 62.75 in | Wt 116.6 lb

## 2014-09-09 DIAGNOSIS — S51859A Open bite of unspecified forearm, initial encounter: Secondary | ICD-10-CM | POA: Insufficient documentation

## 2014-09-09 DIAGNOSIS — W5501XA Bitten by cat, initial encounter: Secondary | ICD-10-CM | POA: Insufficient documentation

## 2014-09-09 DIAGNOSIS — L03114 Cellulitis of left upper limb: Secondary | ICD-10-CM

## 2014-09-09 DIAGNOSIS — S51852A Open bite of left forearm, initial encounter: Secondary | ICD-10-CM

## 2014-09-09 DIAGNOSIS — L0390 Cellulitis, unspecified: Secondary | ICD-10-CM | POA: Insufficient documentation

## 2014-09-09 MED ORDER — SULFAMETHOXAZOLE-TMP DS 800-160 MG PO TABS: 1.0000 | ORAL_TABLET | Freq: Two times a day (BID) | ORAL | Status: DC

## 2014-09-09 MED ORDER — METRONIDAZOLE 500 MG PO TABS: 500.0000 mg | ORAL_TABLET | Freq: Three times a day (TID) | ORAL | Status: DC

## 2014-09-09 NOTE — Patient Instructions (Signed)
Your have infection secondary to cat bite. I am prescribing bactrim ds and flagyl since you cannot tolerate augmentin and have reaction to quinolones.   Follow up in 2 days or as needed any worsening or changing signs or symptoms.

## 2014-09-09 NOTE — Progress Notes (Signed)
Pre visit review using our clinic review tool, if applicable. No additional management support is needed unless otherwise documented below in the visit note. 

## 2014-09-09 NOTE — Assessment & Plan Note (Signed)
Infection after cat bite.Pt can't tolerate augmentin or quinolone. So rx of bactrim and flagyl as alternative. Follow up in 2 days to recheck area.

## 2014-09-09 NOTE — Progress Notes (Signed)
Subjective:    Patient ID: Samantha Miranda, female    DOB: Aug 24, 1946, 68 y.o.   MRN: [id number]  HPI  Pt got bit by a cat on past Friday. Area of bite turned red on Saturday. Pt bit by mothers cat. Pt states petting the cat and then cat got little bit aggressive.   Pt states cat is vaccinations up to date. Cat is indoor cat. 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 Pt is allergic to cillins.(Particularly can't tolerate augmentin)  Pt up to date on tetanus.  Past Medical History  Diagnosis Date  . Cancer     [place] cancer  . Hyperlipidemia   . Osteopenia     History   Social History  . Marital Status: Married    Spouse Name: N/A    Number of Children: N/A  . Years of Education: N/A   Occupational History  .  [employer]    retired   Social History Main Topics  . Smoking status: Former Research scientist (life sciences)  . Smokeless tobacco: Former Systems developer    Quit date: 08/26/2012  . Alcohol Use: No  . Drug Use: No  . Sexual Activity: Not on file   Other Topics Concern  . Not on file   Social History Narrative  . No narrative on file    Past Surgical History  Procedure Laterality Date  . Abdominal hysterectomy  1991  . Breast lumpectomy  9/07  . Cataract extraction, bilateral    . Eye surgery  08/2013  10/2013    cataracts    Family History  Problem Relation Age of Onset  . Colon cancer    . Breast cancer    . Lung cancer    . Diabetes Maternal Grandmother   . Heart failure Mother     CHF  . Hyperlipidemia Mother   . Heart attack Neg Hx   . Hypertension Neg Hx   . Sudden death Neg Hx   . Lung cancer Father 28    lung  . Cancer Sister 50    breast    Allergies  Allergen Reactions  . Amoxicillin-Pot Clavulanate Nausea And Vomiting  . Moxifloxacin     Current Outpatient Prescriptions on File Prior to Visit  Medication Sig Dispense Refill  . Cholecalciferol (VITAMIN D) 2000 UNITS tablet Take 4,000 Units by mouth daily.       . Coenzyme Q10 (COQ10 PO) Take 1 each by mouth daily.      Marland Kitchen FLUoxetine (PROZAC) 10 MG tablet take 1 tablet by mouth once daily  30 tablet  5  . fluticasone (FLONASE) 50 MCG/ACT nasal spray Place 2 sprays into the nose daily.  16 g  6  . hydrocortisone-pramoxine (ANALPRAM-HC) 2.5-1 % rectal cream apply to affected area rectally three times a day  30 g  1  . MAGNESIUM PO Take 1 each by mouth daily.      . Probiotic Product (PROBIOTIC DAILY PO) Take 1 tablet by mouth daily.      Marland Kitchen ZETIA 10 MG tablet take 1 tablet by mouth once daily  30 tablet  5  . zolpidem (AMBIEN) 5 MG tablet take 1 tablet by mouth at bedtime if needed for sleep  30 tablet  0   No current facility-administered medications on file prior to visit.    BP 134/83  Pulse 76  Temp(Src) 98.5 F (36.9 C) (Oral)  Ht 5' 2.75" (1.594 m)  Wt 116 lb 9.6 oz (52.889 kg)  BMI 20.82 kg/m2  SpO2 98%         Review of Systems  Constitutional: Negative for fever, chills and fatigue.  Respiratory: Negative for cough, choking, chest tightness, shortness of breath and wheezing.   Cardiovascular: Negative for chest pain and palpitations.  Gastrointestinal: Negative.   Musculoskeletal:       Lt forearm pain.  Skin:       Red warm and tender area left forearm.  Neurological: Negative.   Hematological: Negative for adenopathy. Does not bruise/bleed easily.       Objective:   Physical Exam General- no actue distress. Pleasant pt.Marland Kitchen Lt- distal  forearm 3 inch x 3 inch red, warm and tender area. No ascending lymphadenopathy.       Assessment & Plan:

## 2014-09-09 NOTE — Assessment & Plan Note (Signed)
Rx bactrim and flagyl. Pt allergic/can't tolerate augmentin and quinolone.

## 2014-10-13 ENCOUNTER — Encounter: Payer: Self-pay | Admitting: Family Medicine

## 2014-10-13 ENCOUNTER — Ambulatory Visit (INDEPENDENT_AMBULATORY_CARE_PROVIDER_SITE_OTHER): Payer: Medicare Other | Admitting: Family Medicine

## 2014-10-13 VITALS — BP 131/75 | HR 82 | Temp 97.9°F | Wt 116.0 lb

## 2014-10-13 DIAGNOSIS — E785 Hyperlipidemia, unspecified: Secondary | ICD-10-CM

## 2014-10-13 NOTE — Progress Notes (Signed)
Pre visit review using our clinic review tool, if applicable. No additional management support is needed unless otherwise documented below in the visit note. 

## 2014-10-13 NOTE — Patient Instructions (Signed)

## 2014-10-13 NOTE — Progress Notes (Signed)
   Subjective:    Patient ID: Samantha Miranda, female    DOB: May 10, 1946, 68 y.o.   MRN: [id number]  HPI Pt here for f/u cholesterol.  No complaints.   Review of Systems As above    Objective:   Physical Exam BP 131/75 mmHg  Pulse 82  Temp(Src) 97.9 F (36.6 C) (Oral)  Wt 116 lb (52.617 kg)  SpO2 96% General appearance: alert, cooperative, appears stated age and no distress Throat: lips, mucosa, and tongue normal; teeth and gums normal Neck: no adenopathy, supple, symmetrical, trachea midline and thyroid not enlarged, symmetric, no tenderness/mass/nodules Lungs: clear to auscultation bilaterally Heart: S1, S2 normal Extremities: extremities normal, atraumatic, no cyanosis or edema  Lab Results  Component Value Date   CHOL 257* 04/11/2014   HDL 62.10 04/11/2014   LDLCALC 184* 04/11/2014   LDLDIRECT 176.2 08/12/2013   TRIG 54.0 04/11/2014   CHOLHDL 4 04/11/2014        Assessment & Plan:  1. Hyperlipidemia Check labs, con't meds - Basic metabolic panel - Hepatic function panel - Lipid panel

## 2014-10-14 LAB — LIPID PANEL
CHOLESTEROL: 227 mg/dL — AB (ref 0–200)
HDL: 64.3 mg/dL (ref >39.00)
LDL Cholesterol: 148 mg/dL — ABNORMAL HIGH (ref 0–99)
NonHDL: 162.7
Total CHOL/HDL Ratio: 4
Triglycerides: 74 mg/dL (ref 0.0–149.0)
VLDL: 14.8 mg/dL (ref 0.0–40.0)

## 2014-10-14 LAB — BASIC METABOLIC PANEL
BUN: 11 mg/dL (ref 6–23)
CHLORIDE: 102 meq/L (ref 96–112)
CO2: 28 mEq/L (ref 19–32)
CREATININE: 0.8 mg/dL (ref 0.4–1.2)
Calcium: 9.6 mg/dL (ref 8.4–10.5)
GFR: 72.75 mL/min (ref >60.00)
GLUCOSE: 72 mg/dL (ref 70–99)
POTASSIUM: 4.1 meq/L (ref 3.5–5.1)
Sodium: 140 mEq/L (ref 135–145)

## 2014-10-14 LAB — HEPATIC FUNCTION PANEL
ALBUMIN: 4.1 g/dL (ref 3.5–5.2)
ALT: 18 U/L (ref 0–35)
AST: 24 U/L (ref 0–37)
Alkaline Phosphatase: 48 U/L (ref 39–117)
Bilirubin, Direct: 0.1 mg/dL (ref 0.0–0.3)
Total Bilirubin: 0.5 mg/dL (ref 0.2–1.2)
Total Protein: 7.5 g/dL (ref 6.0–8.3)

## 2014-11-04 ENCOUNTER — Encounter: Payer: Self-pay | Admitting: Family Medicine

## 2014-11-04 NOTE — Telephone Encounter (Signed)
If note is just to say she was here --- OK

## 2014-12-22 ENCOUNTER — Other Ambulatory Visit: Payer: Self-pay | Admitting: Family Medicine

## 2015-02-02 ENCOUNTER — Other Ambulatory Visit: Payer: Self-pay | Admitting: Family Medicine

## 2015-02-05 ENCOUNTER — Other Ambulatory Visit: Payer: Self-pay | Admitting: Family Medicine

## 2015-03-31 ENCOUNTER — Telehealth: Payer: Self-pay | Admitting: Family Medicine

## 2015-03-31 ENCOUNTER — Encounter: Payer: Self-pay | Admitting: Family Medicine

## 2015-03-31 NOTE — Telephone Encounter (Signed)
Pre Visit letter sent  °

## 2015-04-14 ENCOUNTER — Encounter: Payer: Medicare Other | Admitting: Family Medicine

## 2015-04-21 ENCOUNTER — Encounter: Payer: Self-pay | Admitting: Family Medicine

## 2015-04-21 ENCOUNTER — Ambulatory Visit (INDEPENDENT_AMBULATORY_CARE_PROVIDER_SITE_OTHER): Payer: Medicare Other | Admitting: Family Medicine

## 2015-04-21 VITALS — BP 130/74 | HR 75 | Temp 97.9°F | Ht 63.0 in | Wt 118.6 lb

## 2015-04-21 DIAGNOSIS — E785 Hyperlipidemia, unspecified: Secondary | ICD-10-CM

## 2015-04-21 DIAGNOSIS — Z23 Encounter for immunization: Secondary | ICD-10-CM

## 2015-04-21 DIAGNOSIS — Z0000 Encounter for general adult medical examination without abnormal findings: Secondary | ICD-10-CM | POA: Diagnosis not present

## 2015-04-21 DIAGNOSIS — J209 Acute bronchitis, unspecified: Secondary | ICD-10-CM | POA: Insufficient documentation

## 2015-04-21 DIAGNOSIS — J40 Bronchitis, not specified as acute or chronic: Secondary | ICD-10-CM | POA: Insufficient documentation

## 2015-04-21 DIAGNOSIS — Z87891 Personal history of nicotine dependence: Secondary | ICD-10-CM

## 2015-04-21 DIAGNOSIS — R059 Cough, unspecified: Secondary | ICD-10-CM

## 2015-04-21 DIAGNOSIS — R05 Cough: Secondary | ICD-10-CM

## 2015-04-21 LAB — CBC WITH DIFFERENTIAL/PLATELET
Basophils Absolute: 0.1 10*3/uL (ref 0.0–0.1)
Basophils Relative: 1.3 % (ref 0.0–3.0)
EOS PCT: 8.7 % — AB (ref 0.0–5.0)
Eosinophils Absolute: 0.5 10*3/uL (ref 0.0–0.7)
HCT: 39 % (ref 36.0–46.0)
Hemoglobin: 13.2 g/dL (ref 12.0–15.0)
LYMPHS ABS: 1.6 10*3/uL (ref 0.7–4.0)
Lymphocytes Relative: 26.7 % (ref 12.0–46.0)
MCHC: 33.8 g/dL (ref 30.0–36.0)
MCV: 91.1 fl (ref 78.0–100.0)
Monocytes Absolute: 0.6 10*3/uL (ref 0.1–1.0)
Monocytes Relative: 9.1 % (ref 3.0–12.0)
Neutro Abs: 3.3 10*3/uL (ref 1.4–7.7)
Neutrophils Relative %: 54.2 % (ref 43.0–77.0)
PLATELETS: 229 10*3/uL (ref 150.0–400.0)
RBC: 4.29 Mil/uL (ref 3.87–5.11)
RDW: 12.8 % (ref 11.5–15.5)
WBC: 6.1 10*3/uL (ref 4.0–10.5)

## 2015-04-21 LAB — HEPATIC FUNCTION PANEL
ALBUMIN: 4.2 g/dL (ref 3.5–5.2)
ALT: 13 U/L (ref 0–35)
AST: 20 U/L (ref 0–37)
Alkaline Phosphatase: 51 U/L (ref 39–117)
BILIRUBIN TOTAL: 0.4 mg/dL (ref 0.2–1.2)
Bilirubin, Direct: 0 mg/dL (ref 0.0–0.3)
TOTAL PROTEIN: 7.5 g/dL (ref 6.0–8.3)

## 2015-04-21 LAB — BASIC METABOLIC PANEL
BUN: 16 mg/dL (ref 6–23)
CALCIUM: 9.4 mg/dL (ref 8.4–10.5)
CO2: 28 mEq/L (ref 19–32)
Chloride: 104 mEq/L (ref 96–112)
Creatinine, Ser: 0.81 mg/dL (ref 0.40–1.20)
GFR: 74.71 mL/min (ref >60.00)
Glucose, Bld: 79 mg/dL (ref 70–99)
POTASSIUM: 4 meq/L (ref 3.5–5.1)
SODIUM: 138 meq/L (ref 135–145)

## 2015-04-21 LAB — LIPID PANEL
Cholesterol: 204 mg/dL — ABNORMAL HIGH (ref 0–200)
HDL: 62.8 mg/dL (ref >39.00)
LDL Cholesterol: 127 mg/dL — ABNORMAL HIGH (ref 0–99)
NONHDL: 141.2
TRIGLYCERIDES: 73 mg/dL (ref 0.0–149.0)
Total CHOL/HDL Ratio: 3
VLDL: 14.6 mg/dL (ref 0.0–40.0)

## 2015-04-21 MED ORDER — PNEUMOCOCCAL 13-VAL CONJ VACC IM SUSP: 0.5000 mL | INTRAMUSCULAR | Status: DC

## 2015-04-21 NOTE — Patient Instructions (Signed)
Preventive Care for Adults A healthy lifestyle and preventive care can promote health and wellness. Preventive health guidelines for women include the following key practices.  A routine yearly physical is a good way to check with your health care provider about your health and preventive screening. It is a chance to share any concerns and updates on your health and to receive a thorough exam.  Visit your dentist for a routine exam and preventive care every 6 months. Brush your teeth twice a day and floss once a day. Good oral hygiene prevents tooth decay and gum disease.  The frequency of eye exams is based on your age, health, family medical history, use of contact lenses, and other factors. Follow your health care provider's recommendations for frequency of eye exams.  Eat a healthy diet. Foods like vegetables, fruits, whole grains, low-fat dairy products, and lean protein foods contain the nutrients you need without too many calories. Decrease your intake of foods high in solid fats, added sugars, and salt. Eat the right amount of calories for you.Get information about a proper diet from your health care provider, if necessary.  Regular physical exercise is one of the most important things you can do for your health. Most adults should get at least 150 minutes of moderate-intensity exercise (any activity that increases your heart rate and causes you to sweat) each week. In addition, most adults need muscle-strengthening exercises on 2 or more days a week.  Maintain a healthy weight. The body mass index (BMI) is a screening tool to identify possible weight problems. It provides an estimate of body fat based on height and weight. Your health care provider can find your BMI and can help you achieve or maintain a healthy weight.For adults 20 years and older:  A BMI below 18.5 is considered underweight.  A BMI of 18.5 to 24.9 is normal.  A BMI of 25 to 29.9 is considered overweight.  A BMI of  30 and above is considered obese.  Maintain normal blood lipids and cholesterol levels by exercising and minimizing your intake of saturated fat. Eat a balanced diet with plenty of fruit and vegetables. Blood tests for lipids and cholesterol should begin at age 76 and be repeated every 5 years. If your lipid or cholesterol levels are high, you are over 50, or you are at high risk for heart disease, you may need your cholesterol levels checked more frequently.Ongoing high lipid and cholesterol levels should be treated with medicines if diet and exercise are not working.  If you smoke, find out from your health care provider how to quit. If you do not use tobacco, do not start.  Lung cancer screening is recommended for adults aged 22-80 years who are at high risk for developing lung cancer because of a history of smoking. A yearly low-dose CT scan of the lungs is recommended for people who have at least a 30-pack-year history of smoking and are a current smoker or have quit within the past 15 years. A pack year of smoking is smoking an average of 1 pack of cigarettes a day for 1 year (for example: 1 pack a day for 30 years or 2 packs a day for 15 years). Yearly screening should continue until the smoker has stopped smoking for at least 15 years. Yearly screening should be stopped for people who develop a health problem that would prevent them from having lung cancer treatment.  If you are pregnant, do not drink alcohol. If you are breastfeeding,  be very cautious about drinking alcohol. If you are not pregnant and choose to drink alcohol, do not have more than 1 drink per day. One drink is considered to be 12 ounces (355 mL) of beer, 5 ounces (148 mL) of wine, or 1.5 ounces (44 mL) of liquor.  Avoid use of street drugs. Do not share needles with anyone. Ask for help if you need support or instructions about stopping the use of drugs.  High blood pressure causes heart disease and increases the risk of  stroke. Your blood pressure should be checked at least every 1 to 2 years. Ongoing high blood pressure should be treated with medicines if weight loss and exercise do not work.  If you are 75-52 years old, ask your health care provider if you should take aspirin to prevent strokes.  Diabetes screening involves taking a blood sample to check your fasting blood sugar level. This should be done once every 3 years, after age 15, if you are within normal weight and without risk factors for diabetes. Testing should be considered at a younger age or be carried out more frequently if you are overweight and have at least 1 risk factor for diabetes.  Breast cancer screening is essential preventive care for women. You should practice "breast self-awareness." This means understanding the normal appearance and feel of your breasts and may include breast self-examination. Any changes detected, no matter how small, should be reported to a health care provider. Women in their 58s and 30s should have a clinical breast exam (CBE) by a health care provider as part of a regular health exam every 1 to 3 years. After age 16, women should have a CBE every year. Starting at age 53, women should consider having a mammogram (breast X-ray test) every year. Women who have a family history of breast cancer should talk to their health care provider about genetic screening. Women at a high risk of breast cancer should talk to their health care providers about having an MRI and a mammogram every year.  Breast cancer gene (BRCA)-related cancer risk assessment is recommended for women who have family members with BRCA-related cancers. BRCA-related cancers include breast, ovarian, tubal, and peritoneal cancers. Having family members with these cancers may be associated with an increased risk for harmful changes (mutations) in the breast cancer genes BRCA1 and BRCA2. Results of the assessment will determine the need for genetic counseling and  BRCA1 and BRCA2 testing.  Routine pelvic exams to screen for cancer are no longer recommended for nonpregnant women who are considered low risk for cancer of the pelvic organs (ovaries, uterus, and vagina) and who do not have symptoms. Ask your health care provider if a screening pelvic exam is right for you.  If you have had past treatment for cervical cancer or a condition that could lead to cancer, you need Pap tests and screening for cancer for at least 20 years after your treatment. If Pap tests have been discontinued, your risk factors (such as having a new sexual partner) need to be reassessed to determine if screening should be resumed. Some women have medical problems that increase the chance of getting cervical cancer. In these cases, your health care provider may recommend more frequent screening and Pap tests.  The HPV test is an additional test that may be used for cervical cancer screening. The HPV test looks for the virus that can cause the cell changes on the cervix. The cells collected during the Pap test can be  tested for HPV. The HPV test could be used to screen women aged 30 years and older, and should be used in women of any age who have unclear Pap test results. After the age of 30, women should have HPV testing at the same frequency as a Pap test.  Colorectal cancer can be detected and often prevented. Most routine colorectal cancer screening begins at the age of 50 years and continues through age 75 years. However, your health care provider may recommend screening at an earlier age if you have risk factors for colon cancer. On a yearly basis, your health care provider may provide home test kits to check for hidden blood in the stool. Use of a small camera at the end of a tube, to directly examine the colon (sigmoidoscopy or colonoscopy), can detect the earliest forms of colorectal cancer. Talk to your health care provider about this at age 50, when routine screening begins. Direct  exam of the colon should be repeated every 5-10 years through age 75 years, unless early forms of pre-cancerous polyps or small growths are found.  People who are at an increased risk for hepatitis B should be screened for this virus. You are considered at high risk for hepatitis B if:  You were born in a country where hepatitis B occurs often. Talk with your health care provider about which countries are considered high risk.  Your parents were born in a high-risk country and you have not received a shot to protect against hepatitis B (hepatitis B vaccine).  You have HIV or AIDS.  You use needles to inject street drugs.  You live with, or have sex with, someone who has hepatitis B.  You get hemodialysis treatment.  You take certain medicines for conditions like cancer, organ transplantation, and autoimmune conditions.  Hepatitis C blood testing is recommended for all people born from 1945 through 1965 and any individual with known risks for hepatitis C.  Practice safe sex. Use condoms and avoid high-risk sexual practices to reduce the spread of sexually transmitted infections (STIs). STIs include gonorrhea, chlamydia, syphilis, trichomonas, herpes, HPV, and human immunodeficiency virus (HIV). Herpes, HIV, and HPV are viral illnesses that have no cure. They can result in disability, cancer, and death.  You should be screened for sexually transmitted illnesses (STIs) including gonorrhea and chlamydia if:  You are sexually active and are younger than 24 years.  You are older than 24 years and your health care provider tells you that you are at risk for this type of infection.  Your sexual activity has changed since you were last screened and you are at an increased risk for chlamydia or gonorrhea. Ask your health care provider if you are at risk.  If you are at risk of being infected with HIV, it is recommended that you take a prescription medicine daily to prevent HIV infection. This is  called preexposure prophylaxis (PrEP). You are considered at risk if:  You are a heterosexual woman, are sexually active, and are at increased risk for HIV infection.  You take drugs by injection.  You are sexually active with a partner who has HIV.  Talk with your health care provider about whether you are at high risk of being infected with HIV. If you choose to begin PrEP, you should first be tested for HIV. You should then be tested every 3 months for as long as you are taking PrEP.  Osteoporosis is a disease in which the bones lose minerals and strength   with aging. This can result in serious bone fractures or breaks. The risk of osteoporosis can be identified using a bone density scan. Women ages 65 years and over and women at risk for fractures or osteoporosis should discuss screening with their health care providers. Ask your health care provider whether you should take a calcium supplement or vitamin D to reduce the rate of osteoporosis.  Menopause can be associated with physical symptoms and risks. Hormone replacement therapy is available to decrease symptoms and risks. You should talk to your health care provider about whether hormone replacement therapy is right for you.  Use sunscreen. Apply sunscreen liberally and repeatedly throughout the day. You should seek shade when your shadow is shorter than you. Protect yourself by wearing long sleeves, pants, a wide-brimmed hat, and sunglasses year round, whenever you are outdoors.  Once a month, do a whole body skin exam, using a mirror to look at the skin on your back. Tell your health care provider of new moles, moles that have irregular borders, moles that are larger than a pencil eraser, or moles that have changed in shape or color.  Stay current with required vaccines (immunizations).  Influenza vaccine. All adults should be immunized every year.  Tetanus, diphtheria, and acellular pertussis (Td, Tdap) vaccine. Pregnant women should  receive 1 dose of Tdap vaccine during each pregnancy. The dose should be obtained regardless of the length of time since the last dose. Immunization is preferred during the 27th-36th week of gestation. An adult who has not previously received Tdap or who does not know her vaccine status should receive 1 dose of Tdap. This initial dose should be followed by tetanus and diphtheria toxoids (Td) booster doses every 10 years. Adults with an unknown or incomplete history of completing a 3-dose immunization series with Td-containing vaccines should begin or complete a primary immunization series including a Tdap dose. Adults should receive a Td booster every 10 years.  Varicella vaccine. An adult without evidence of immunity to varicella should receive 2 doses or a second dose if she has previously received 1 dose. Pregnant females who do not have evidence of immunity should receive the first dose after pregnancy. This first dose should be obtained before leaving the health care facility. The second dose should be obtained 4-8 weeks after the first dose.  Human papillomavirus (HPV) vaccine. Females aged 13-26 years who have not received the vaccine previously should obtain the 3-dose series. The vaccine is not recommended for use in pregnant females. However, pregnancy testing is not needed before receiving a dose. If a female is found to be pregnant after receiving a dose, no treatment is needed. In that case, the remaining doses should be delayed until after the pregnancy. Immunization is recommended for any person with an immunocompromised condition through the age of 26 years if she did not get any or all doses earlier. During the 3-dose series, the second dose should be obtained 4-8 weeks after the first dose. The third dose should be obtained 24 weeks after the first dose and 16 weeks after the second dose.  Zoster vaccine. One dose is recommended for adults aged 60 years or older unless certain conditions are  present.  Measles, mumps, and rubella (MMR) vaccine. Adults born before 1957 generally are considered immune to measles and mumps. Adults born in 1957 or later should have 1 or more doses of MMR vaccine unless there is a contraindication to the vaccine or there is laboratory evidence of immunity to   each of the three diseases. A routine second dose of MMR vaccine should be obtained at least 28 days after the first dose for students attending postsecondary schools, health care workers, or international travelers. People who received inactivated measles vaccine or an unknown type of measles vaccine during 1963-1967 should receive 2 doses of MMR vaccine. People who received inactivated mumps vaccine or an unknown type of mumps vaccine before 1979 and are at high risk for mumps infection should consider immunization with 2 doses of MMR vaccine. For females of childbearing age, rubella immunity should be determined. If there is no evidence of immunity, females who are not pregnant should be vaccinated. If there is no evidence of immunity, females who are pregnant should delay immunization until after pregnancy. Unvaccinated health care workers born before 1957 who lack laboratory evidence of measles, mumps, or rubella immunity or laboratory confirmation of disease should consider measles and mumps immunization with 2 doses of MMR vaccine or rubella immunization with 1 dose of MMR vaccine.  Pneumococcal 13-valent conjugate (PCV13) vaccine. When indicated, a person who is uncertain of her immunization history and has no record of immunization should receive the PCV13 vaccine. An adult aged 19 years or older who has certain medical conditions and has not been previously immunized should receive 1 dose of PCV13 vaccine. This PCV13 should be followed with a dose of pneumococcal polysaccharide (PPSV23) vaccine. The PPSV23 vaccine dose should be obtained at least 8 weeks after the dose of PCV13 vaccine. An adult aged 19  years or older who has certain medical conditions and previously received 1 or more doses of PPSV23 vaccine should receive 1 dose of PCV13. The PCV13 vaccine dose should be obtained 1 or more years after the last PPSV23 vaccine dose.  Pneumococcal polysaccharide (PPSV23) vaccine. When PCV13 is also indicated, PCV13 should be obtained first. All adults aged 65 years and older should be immunized. An adult younger than age 65 years who has certain medical conditions should be immunized. Any person who resides in a nursing home or long-term care facility should be immunized. An adult smoker should be immunized. People with an immunocompromised condition and certain other conditions should receive both PCV13 and PPSV23 vaccines. People with human immunodeficiency virus (HIV) infection should be immunized as soon as possible after diagnosis. Immunization during chemotherapy or radiation therapy should be avoided. Routine use of PPSV23 vaccine is not recommended for American Indians, [place] Natives, or people younger than 65 years unless there are medical conditions that require PPSV23 vaccine. When indicated, people who have unknown immunization and have no record of immunization should receive PPSV23 vaccine. One-time revaccination 5 years after the first dose of PPSV23 is recommended for people aged 19-64 years who have chronic kidney failure, nephrotic syndrome, asplenia, or immunocompromised conditions. People who received 1-2 doses of PPSV23 before age 65 years should receive another dose of PPSV23 vaccine at age 65 years or later if at least 5 years have passed since the previous dose. Doses of PPSV23 are not needed for people immunized with PPSV23 at or after age 65 years.  Meningococcal vaccine. Adults with asplenia or persistent complement component deficiencies should receive 2 doses of quadrivalent meningococcal conjugate (MenACWY-D) vaccine. The doses should be obtained at least 2 months apart.  Microbiologists working with certain meningococcal bacteria, military recruits, people at risk during an outbreak, and people who travel to or live in countries with a high rate of meningitis should be immunized. A first-year college student up through age   21 years who is living in a residence hall should receive a dose if she did not receive a dose on or after her 16th birthday. Adults who have certain high-risk conditions should receive one or more doses of vaccine.  Hepatitis A vaccine. Adults who wish to be protected from this disease, have certain high-risk conditions, work with hepatitis A-infected animals, work in hepatitis A research labs, or travel to or work in countries with a high rate of hepatitis A should be immunized. Adults who were previously unvaccinated and who anticipate close contact with an international adoptee during the first 60 days after arrival in the [place] from a country with a high rate of hepatitis A should be immunized.  Hepatitis B vaccine. Adults who wish to be protected from this disease, have certain high-risk conditions, may be exposed to blood or other infectious body fluids, are household contacts or sex partners of hepatitis B positive people, are clients or workers in certain care facilities, or travel to or work in countries with a high rate of hepatitis B should be immunized.  Haemophilus influenzae type b (Hib) vaccine. A previously unvaccinated person with asplenia or sickle cell disease or having a scheduled splenectomy should receive 1 dose of Hib vaccine. Regardless of previous immunization, a recipient of a hematopoietic stem cell transplant should receive a 3-dose series 6-12 months after her successful transplant. Hib vaccine is not recommended for adults with HIV infection. Preventive Services / Frequency Ages 64 to 68 years  Blood pressure check.** / Every 1 to 2 years.  Lipid and cholesterol check.** / Every 5 years beginning at age  22.  Clinical breast exam.** / Every 3 years for women in their 88s and 53s.  BRCA-related cancer risk assessment.** / For women who have family members with a BRCA-related cancer (breast, ovarian, tubal, or peritoneal cancers).  Pap test.** / Every 2 years from ages 90 through 51. Every 3 years starting at age 21 through age 56 or 3 with a history of 3 consecutive normal Pap tests.  HPV screening.** / Every 3 years from ages 24 through ages 1 to 46 with a history of 3 consecutive normal Pap tests.  Hepatitis C blood test.** / For any individual with known risks for hepatitis C.  Skin self-exam. / Monthly.  Influenza vaccine. / Every year.  Tetanus, diphtheria, and acellular pertussis (Tdap, Td) vaccine.** / Consult your health care provider. Pregnant women should receive 1 dose of Tdap vaccine during each pregnancy. 1 dose of Td every 10 years.  Varicella vaccine.** / Consult your health care provider. Pregnant females who do not have evidence of immunity should receive the first dose after pregnancy.  HPV vaccine. / 3 doses over 6 months, if 72 and younger. The vaccine is not recommended for use in pregnant females. However, pregnancy testing is not needed before receiving a dose.  Measles, mumps, rubella (MMR) vaccine.** / You need at least 1 dose of MMR if you were born in 1957 or later. You may also need a 2nd dose. For females of childbearing age, rubella immunity should be determined. If there is no evidence of immunity, females who are not pregnant should be vaccinated. If there is no evidence of immunity, females who are pregnant should delay immunization until after pregnancy.  Pneumococcal 13-valent conjugate (PCV13) vaccine.** / Consult your health care provider.  Pneumococcal polysaccharide (PPSV23) vaccine.** / 1 to 2 doses if you smoke cigarettes or if you have certain conditions.  Meningococcal vaccine.** /  1 dose if you are age 19 to 21 years and a first-year college  student living in a residence hall, or have one of several medical conditions, you need to get vaccinated against meningococcal disease. You may also need additional booster doses.  Hepatitis A vaccine.** / Consult your health care provider.  Hepatitis B vaccine.** / Consult your health care provider.  Haemophilus influenzae type b (Hib) vaccine.** / Consult your health care provider. Ages 40 to 64 years  Blood pressure check.** / Every 1 to 2 years.  Lipid and cholesterol check.** / Every 5 years beginning at age 20 years.  Lung cancer screening. / Every year if you are aged 55-80 years and have a 30-pack-year history of smoking and currently smoke or have quit within the past 15 years. Yearly screening is stopped once you have quit smoking for at least 15 years or develop a health problem that would prevent you from having lung cancer treatment.  Clinical breast exam.** / Every year after age 40 years.  BRCA-related cancer risk assessment.** / For women who have family members with a BRCA-related cancer (breast, ovarian, tubal, or peritoneal cancers).  Mammogram.** / Every year beginning at age 40 years and continuing for as long as you are in good health. Consult with your health care provider.  Pap test.** / Every 3 years starting at age 30 years through age 65 or 70 years with a history of 3 consecutive normal Pap tests.  HPV screening.** / Every 3 years from ages 30 years through ages 65 to 70 years with a history of 3 consecutive normal Pap tests.  Fecal occult blood test (FOBT) of stool. / Every year beginning at age 50 years and continuing until age 75 years. You may not need to do this test if you get a colonoscopy every 10 years.  Flexible sigmoidoscopy or colonoscopy.** / Every 5 years for a flexible sigmoidoscopy or every 10 years for a colonoscopy beginning at age 50 years and continuing until age 75 years.  Hepatitis C blood test.** / For all people born from 1945 through  1965 and any individual with known risks for hepatitis C.  Skin self-exam. / Monthly.  Influenza vaccine. / Every year.  Tetanus, diphtheria, and acellular pertussis (Tdap/Td) vaccine.** / Consult your health care provider. Pregnant women should receive 1 dose of Tdap vaccine during each pregnancy. 1 dose of Td every 10 years.  Varicella vaccine.** / Consult your health care provider. Pregnant females who do not have evidence of immunity should receive the first dose after pregnancy.  Zoster vaccine.** / 1 dose for adults aged 60 years or older.  Measles, mumps, rubella (MMR) vaccine.** / You need at least 1 dose of MMR if you were born in 1957 or later. You may also need a 2nd dose. For females of childbearing age, rubella immunity should be determined. If there is no evidence of immunity, females who are not pregnant should be vaccinated. If there is no evidence of immunity, females who are pregnant should delay immunization until after pregnancy.  Pneumococcal 13-valent conjugate (PCV13) vaccine.** / Consult your health care provider.  Pneumococcal polysaccharide (PPSV23) vaccine.** / 1 to 2 doses if you smoke cigarettes or if you have certain conditions.  Meningococcal vaccine.** / Consult your health care provider.  Hepatitis A vaccine.** / Consult your health care provider.  Hepatitis B vaccine.** / Consult your health care provider.  Haemophilus influenzae type b (Hib) vaccine.** / Consult your health care provider. Ages 65   years and over  Blood pressure check.** / Every 1 to 2 years.  Lipid and cholesterol check.** / Every 5 years beginning at age 22 years.  Lung cancer screening. / Every year if you are aged 73-80 years and have a 30-pack-year history of smoking and currently smoke or have quit within the past 15 years. Yearly screening is stopped once you have quit smoking for at least 15 years or develop a health problem that would prevent you from having lung cancer  treatment.  Clinical breast exam.** / Every year after age 4 years.  BRCA-related cancer risk assessment.** / For women who have family members with a BRCA-related cancer (breast, ovarian, tubal, or peritoneal cancers).  Mammogram.** / Every year beginning at age 40 years and continuing for as long as you are in good health. Consult with your health care provider.  Pap test.** / Every 3 years starting at age 9 years through age 34 or 91 years with 3 consecutive normal Pap tests. Testing can be stopped between 65 and 70 years with 3 consecutive normal Pap tests and no abnormal Pap or HPV tests in the past 10 years.  HPV screening.** / Every 3 years from ages 57 years through ages 64 or 45 years with a history of 3 consecutive normal Pap tests. Testing can be stopped between 65 and 70 years with 3 consecutive normal Pap tests and no abnormal Pap or HPV tests in the past 10 years.  Fecal occult blood test (FOBT) of stool. / Every year beginning at age 15 years and continuing until age 17 years. You may not need to do this test if you get a colonoscopy every 10 years.  Flexible sigmoidoscopy or colonoscopy.** / Every 5 years for a flexible sigmoidoscopy or every 10 years for a colonoscopy beginning at age 86 years and continuing until age 71 years.  Hepatitis C blood test.** / For all people born from [birth year] through 1964 and any individual with known risks for hepatitis C.  Osteoporosis screening.** / A one-time screening for women ages 83 years and over and women at risk for fractures or osteoporosis.  Skin self-exam. / Monthly.  Influenza vaccine. / Every year.  Tetanus, diphtheria, and acellular pertussis (Tdap/Td) vaccine.** / 1 dose of Td every 10 years.  Varicella vaccine.** / Consult your health care provider.  Zoster vaccine.** / 1 dose for adults aged 61 years or older.  Pneumococcal 13-valent conjugate (PCV13) vaccine.** / Consult your health care provider.  Pneumococcal  polysaccharide (PPSV23) vaccine.** / 1 dose for all adults aged 28 years and older.  Meningococcal vaccine.** / Consult your health care provider.  Hepatitis A vaccine.** / Consult your health care provider.  Hepatitis B vaccine.** / Consult your health care provider.  Haemophilus influenzae type b (Hib) vaccine.** / Consult your health care provider. ** Family history and personal history of risk and conditions may change your health care provider's recommendations. Document Released: 01/03/2002 Document Revised: 03/24/2014 Document Reviewed: 04/04/2011 [facility] Patient Information 2015 [place], [place]. This information is not intended to replace advice given to you by your health care provider. Make sure you discuss any questions you have with your health care provider.

## 2015-04-21 NOTE — Progress Notes (Signed)
Subjective:    Samantha Miranda is a 69 y.o. female who presents for Medicare Annual/Subsequent preventive examination.  Preventive Screening-Counseling & Management  Tobacco History  Smoking status  . Former Smoker  Smokeless tobacco  . Former Systems developer  . Quit date: 08/26/2012     Problems Prior to Visit 1. Cough-- dry  Current Problems (verified) Patient Active Problem List   Diagnosis Date Noted  . Cellulitis 09/09/2014  . Cat bite of forearm 09/09/2014  . Right ankle pain 02/10/2014  . Left shoulder pain 11/22/2013  . Right shoulder pain 08/20/2013  . Near syncope 08/12/2013  . Other malaise and fatigue 08/12/2013  . Rectal itching 07/01/2013  . 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  . GERD (gastroesophageal reflux disease) 08/12/2012  . Former heavy tobacco smoker 08/26/2010  . COLONIC POLYPS, HX OF 08/26/2010  . POSTMENOPAUSAL STATUS 08/26/2010  . POSTMENOPAUSAL BLEEDING 04/13/2009  . HYPERLIPIDEMIA 07/11/2007  . DEPRESSION 07/11/2007  . OSTEOPENIA 07/11/2007  . BREAST CANCER, HX OF 07/11/2007    Medications Prior to Visit Current Outpatient Prescriptions on File Prior to Visit  Medication Sig Dispense Refill  . Cholecalciferol (VITAMIN D) 2000 UNITS tablet Take 4,000 Units by mouth daily.    . Coenzyme Q10 (COQ10 PO) Take 1 each by mouth daily.    Marland Kitchen FLUoxetine (PROZAC) 10 MG tablet take 1 tablet by mouth once daily 30 tablet 5  . fluticasone (FLONASE) 50 MCG/ACT nasal spray Place 2 sprays into the nose daily. 16 g 6  . hydrocortisone-pramoxine (ANALPRAM-HC) 2.5-1 % rectal cream apply to affected area three times a day 30 g 1  . MAGNESIUM PO Take 1 each by mouth daily.    . Probiotic Product (PROBIOTIC DAILY PO) Take 1 tablet by mouth daily.    . RESTASIS 0.05 % ophthalmic emulsion Place 1 drop into both eyes 2 (two) times daily.  0  . ZETIA 10 MG tablet take 1 tablet by mouth once daily 30 tablet 5  . zolpidem (AMBIEN) 5 MG tablet take 1 tablet by mouth at bedtime if  needed for sleep 30 tablet 0   No current facility-administered medications on file prior to visit.    Current Medications (verified) Current Outpatient Prescriptions  Medication Sig Dispense Refill  . Cholecalciferol (VITAMIN D) 2000 UNITS tablet Take 4,000 Units by mouth daily.    . Coenzyme Q10 (COQ10 PO) Take 1 each by mouth daily.    Marland Kitchen FLUoxetine (PROZAC) 10 MG tablet take 1 tablet by mouth once daily 30 tablet 5  . fluticasone (FLONASE) 50 MCG/ACT nasal spray Place 2 sprays into the nose daily. 16 g 6  . hydrocortisone-pramoxine (ANALPRAM-HC) 2.5-1 % rectal cream apply to affected area three times a day 30 g 1  . MAGNESIUM PO Take 1 each by mouth daily.    . Probiotic Product (PROBIOTIC DAILY PO) Take 1 tablet by mouth daily.    . RESTASIS 0.05 % ophthalmic emulsion Place 1 drop into both eyes 2 (two) times daily.  0  . ZETIA 10 MG tablet take 1 tablet by mouth once daily 30 tablet 5  . zolpidem (AMBIEN) 5 MG tablet take 1 tablet by mouth at bedtime if needed for sleep 30 tablet 0   No current facility-administered medications for this visit.     Allergies (verified) Amoxicillin-pot clavulanate and Moxifloxacin   PAST HISTORY  Family History Family History  Problem Relation Age of Onset  . Colon cancer    . Breast cancer    . Lung  cancer    . Diabetes Maternal Grandmother   . Heart failure Mother     CHF  . Hyperlipidemia Mother   . Heart attack Neg Hx   . Hypertension Neg Hx   . Sudden death Neg Hx   . Lung cancer Father 28    lung  . Cancer Sister 38    breast    Social History History  Substance Use Topics  . Smoking status: Former Research scientist (life sciences)  . Smokeless tobacco: Former Systems developer    Quit date: 08/26/2012  . Alcohol Use: No     Are there smokers in your home (other than you)? No  Risk Factors Current exercise habits: The patient does not participate in regular exercise at present.  Dietary issues discussed: no   Cardiac risk factors: advanced age (older  than 78 for men, 56 for women), dyslipidemia and sedentary lifestyle.  Depression Screen (Note: if answer to either of the following is "Yes", a more complete depression screening is indicated)   Over the past two weeks, have you felt down, depressed or hopeless? No  Over the past two weeks, have you felt little interest or pleasure in doing things? No  Have you lost interest or pleasure in daily life? No  Do you often feel hopeless? No  Do you cry easily over simple problems? No  Activities of Daily Living In your present state of health, do you have any difficulty performing the following activities?:  Driving? No Managing money?  No Feeding yourself? No Getting from bed to chair? No Climbing a flight of stairs? No Preparing food and eating?: No Bathing or showering? No Getting dressed: No Getting to the toilet? No Using the toilet:No Moving around from place to place: No In the past year have you fallen or had a near fall?:No   Are you sexually active?  Yes  Do you have more than one partner?  No  Hearing Difficulties: No Do you often ask people to speak up or repeat themselves? No Do you experience ringing or noises in your ears? No Do you have difficulty understanding soft or whispered voices? No   Do you feel that you have a problem with memory? No  Do you often misplace items? No  Do you feel safe at home?  Yes  Cognitive Testing  Alert? Yes  Normal Appearance?Yes  Oriented to person? Yes  Place? Yes   Time? Yes  Recall of three objects?  Yes  Can perform simple calculations? Yes  Displays appropriate judgment?Yes  Can read the correct time from a watch face?Yes   Advanced Directives have been discussed with the patient? Yes  List the Names of Other Physician/Practitioners you currently use: 1.  See snapshot  Indicate any recent Medical Services you may have received from other than [facility] providers in the past year (date may be approximate).  Immunization  History  Administered Date(s) Administered  . Influenza Split 09/22/2011  . Influenza, High Dose Seasonal PF 10/10/2014  . Pneumococcal Polysaccharide-23 03/03/2014  . Td 08/26/2010  . Zoster 04/02/2009    Screening Tests Health Maintenance  Topic Date Due  . PNA vac Low Risk Adult (2 of 2 - PCV13) 03/04/2015  . DEXA SCAN  04/26/2016 (Originally 03/08/2012)  . INFLUENZA VACCINE  06/22/2015  . MAMMOGRAM  02/22/2017  . TETANUS/TDAP  08/26/2020  . COLONOSCOPY  10/23/2021  . ZOSTAVAX  Completed    All answers were reviewed with the patient and necessary referrals were made:  Garnet Koyanagi,  DO   04/21/2015   History reviewed:  She  has a past medical history of Cancer; Hyperlipidemia; and Osteopenia. She  does not have any pertinent problems on file. She  has past surgical history that includes Abdominal hysterectomy (1991); Breast lumpectomy (9/07); Cataract extraction, bilateral; and Eye surgery (08/2013  10/2013). Her family history includes Breast cancer in an other family member; Cancer (age of onset: 61) in her sister; Colon cancer in an other family member; Diabetes in her maternal grandmother; Heart failure in her mother; Hyperlipidemia in her mother; Lung cancer in an other family member; Lung cancer (age of onset: 43) in her father. There is no history of Heart attack, Hypertension, or Sudden death. She  reports that she has quit smoking. She quit smokeless tobacco use about 2 years ago. She reports that she does not drink alcohol or use illicit drugs. She has a current medication list which includes the following prescription(s): vitamin d, coenzyme q10, fluoxetine, fluticasone, hydrocortisone-pramoxine, magnesium, probiotic product, restasis, zetia, and zolpidem. Current Outpatient Prescriptions on File Prior to Visit  Medication Sig Dispense Refill  . Cholecalciferol (VITAMIN D) 2000 UNITS tablet Take 4,000 Units by mouth daily.    . Coenzyme Q10 (COQ10 PO) Take 1 each by  mouth daily.    Marland Kitchen FLUoxetine (PROZAC) 10 MG tablet take 1 tablet by mouth once daily 30 tablet 5  . fluticasone (FLONASE) 50 MCG/ACT nasal spray Place 2 sprays into the nose daily. 16 g 6  . hydrocortisone-pramoxine (ANALPRAM-HC) 2.5-1 % rectal cream apply to affected area three times a day 30 g 1  . MAGNESIUM PO Take 1 each by mouth daily.    . Probiotic Product (PROBIOTIC DAILY PO) Take 1 tablet by mouth daily.    . RESTASIS 0.05 % ophthalmic emulsion Place 1 drop into both eyes 2 (two) times daily.  0  . ZETIA 10 MG tablet take 1 tablet by mouth once daily 30 tablet 5  . zolpidem (AMBIEN) 5 MG tablet take 1 tablet by mouth at bedtime if needed for sleep 30 tablet 0   No current facility-administered medications on file prior to visit.   She is allergic to amoxicillin-pot clavulanate and moxifloxacin.  Review of Systems  Review of Systems  Constitutional: Negative for activity change, appetite change and fatigue.  HENT: Negative for hearing loss, congestion, tinnitus and ear discharge.   Eyes: Negative for visual disturbance (see optho q1y -- vision corrected to 20/20 with glasses).  Respiratory: Negative for cough, chest tightness and shortness of breath.   Cardiovascular: Negative for chest pain, palpitations and leg swelling.  Gastrointestinal: Negative for abdominal pain, diarrhea, constipation and abdominal distention.  Genitourinary: Negative for urgency, frequency, decreased urine volume and difficulty urinating.  Musculoskeletal: Negative for back pain, arthralgias and gait problem.  Skin: Negative for color change, pallor and rash.  Neurological: Negative for dizziness, light-headedness, numbness and headaches.  Hematological: Negative for adenopathy. Does not bruise/bleed easily.  Psychiatric/Behavioral: Negative for suicidal ideas, confusion, sleep disturbance, self-injury, dysphoric mood, decreased concentration and agitation.  Pt is able to read and write and can do all  ADLs No risk for falling No abuse/ violence in home    Objective:     Vision by Snellen chart: opth  Body mass index is 21.01 kg/(m^2). Pulse 75  Temp(Src) 97.9 F (36.6 C) (Oral)  Ht 5\' 3"  (1.6 m)  Wt 118 lb 9.6 oz (53.797 kg)  BMI 21.01 kg/m2  SpO2 98%  BP 130/74 mmHg  Pulse  75  Temp(Src) 97.9 F (36.6 C) (Oral)  Ht 5\' 3"  (1.6 m)  Wt 118 lb 9.6 oz (53.797 kg)  BMI 21.01 kg/m2  SpO2 98% General appearance: alert, cooperative, appears stated age and no distress Head: Normocephalic, without obvious abnormality, atraumatic Eyes: conjunctivae/corneas clear. PERRL, EOM's intact. Fundi benign. Ears: normal TM's and external ear canals both ears Nose: Nares normal. Septum midline. Mucosa normal. No drainage or sinus tenderness. Throat: lips, mucosa, and tongue normal; teeth and gums normal Neck: no adenopathy, no carotid bruit, no JVD, supple, symmetrical, trachea midline and thyroid not enlarged, symmetric, no tenderness/mass/nodules Back: symmetric, no curvature. ROM normal. No CVA tenderness. Lungs: clear to auscultation bilaterally Breasts: normal appearance, no masses or tenderness Heart: regular rate and rhythm, S1, S2 normal, no murmur, click, rub or gallop Abdomen: soft, non-tender; bowel sounds normal; no masses,  no organomegaly Pelvic: not indicated; post-menopausal, no abnormal Pap smears in past Extremities: extremities normal, atraumatic, no cyanosis or edema Pulses: 2+ and symmetric Skin: Skin color, texture, turgor normal. No rashes or lesions Lymph nodes: Cervical, supraclavicular, and axillary nodes normal. Neurologic: Alert and oriented X 3, normal strength and tone. Normal symmetric reflexes. Normal coordination and gait Psych-  No depression, no anxiety      Assessment:     cpe      Plan:     During the course of the visit the patient was educated and counseled about appropriate screening and preventive services including:    Pneumococcal  vaccine   Influenza vaccine  Td vaccine  Screening mammography  Screening Pap smear and pelvic exam   Colorectal cancer screening  Glaucoma screening  Advanced directives: has NO advanced directive  - add't info requested. Referral to SW: yes--- pt to call  Diet review for nutrition referral? Yes ____  Not Indicated __x__   Patient Instructions (the written plan) was given to the patient.  Medicare Attestation I have personally reviewed: The patient's medical and social history Their use of alcohol, tobacco or illicit drugs Their current medications and supplements The patient's functional ability including ADLs,fall risks, home safety risks, cognitive, and hearing and visual impairment Diet and physical activities Evidence for depression or mood disorders  The patient's weight, height, BMI, and visual acuity have been recorded in the chart.  I have made referrals, counseling, and provided education to the patient based on review of the above and I have provided the patient with a written personalized care plan for preventive services.    1. Hyperlipidemia Check labs - Basic metabolic panel - CBC with Differential/Platelet - Hepatic function panel - Lipid panel - POCT urinalysis dipstick  2. Need for vaccination with 13-polyvalent pneumococcal conjugate vaccine   - pneumococcal 13-valent conjugate vaccine (PREVNAR 13) SUSP injection; Inject 0.5 mLs into the muscle tomorrow at 10 am.  Dispense: 0.5 mL; Refill: 0  3. Former smoker   - Ambulatory Referral for [place], DO   04/21/2015

## 2015-04-21 NOTE — Progress Notes (Signed)
Pre visit review using our clinic review tool, if applicable. No additional management support is needed unless otherwise documented below in the visit note. 

## 2015-04-21 NOTE — Assessment & Plan Note (Signed)
Dry cough No other symptoms-- ? Allergy Pt former smoker-- check CT

## 2015-04-22 LAB — POCT URINALYSIS DIPSTICK
BILIRUBIN UA: NEGATIVE
Blood, UA: NEGATIVE
Glucose, UA: NEGATIVE
Ketones, UA: NEGATIVE
LEUKOCYTES UA: NEGATIVE
NITRITE UA: NEGATIVE
PH UA: 6
Protein, UA: NEGATIVE
Spec Grav, UA: 1.025
Urobilinogen, UA: 0.2

## 2015-04-27 ENCOUNTER — Telehealth: Payer: Self-pay | Admitting: Acute Care

## 2015-04-27 ENCOUNTER — Other Ambulatory Visit: Payer: Self-pay | Admitting: Acute Care

## 2015-04-27 DIAGNOSIS — Z87891 Personal history of nicotine dependence: Secondary | ICD-10-CM

## 2015-04-27 NOTE — Telephone Encounter (Signed)
I have spoken with Samantha Miranda and scheduled her for the shared decision making visit and LDCT scan for lung cancer screening. She is scheduled for June 8th at 10:30 for the shared decision making visit, and at 11:30 for the CT. She verbalized both time and location of both appointments.

## 2015-04-29 ENCOUNTER — Ambulatory Visit (INDEPENDENT_AMBULATORY_CARE_PROVIDER_SITE_OTHER)
Admission: RE | Admit: 2015-04-29 | Discharge: 2015-04-29 | Disposition: A | Discharge status: Home / Self Care | Payer: Medicare Other | Source: Ambulatory Visit | Attending: Acute Care | Admitting: Acute Care

## 2015-04-29 ENCOUNTER — Ambulatory Visit (INDEPENDENT_AMBULATORY_CARE_PROVIDER_SITE_OTHER): Payer: Medicare Other | Admitting: Acute Care

## 2015-04-29 ENCOUNTER — Telehealth: Payer: Self-pay | Admitting: Acute Care

## 2015-04-29 ENCOUNTER — Encounter: Payer: Self-pay | Admitting: Acute Care

## 2015-04-29 DIAGNOSIS — Z87891 Personal history of nicotine dependence: Secondary | ICD-10-CM

## 2015-04-29 NOTE — Telephone Encounter (Signed)
I called and spoke with Samantha Miranda about the results of her Screening CT. Lung RADS 2 indicates nodules with very low likelihood of becoming clinically active cancer due to size and lack of growth.I explained to her that the nodule she has that is 5 mm in size has a benign appearance and behavior. I told her she will need to be followed in 12 months with a repeat CT per the Lung Cancer Screening Guidelines and the recommendation of the radiologist who read her scan. She knows I will call her to schedule the scan in late May of 2017. She verbalized understanding and has my contact information for any further questions or concerns.

## 2015-04-29 NOTE — Progress Notes (Signed)
Shared Decision Making Visit Lung Cancer Screening Program [phone])   Eligibility:  Age 69 y.o.  Pack Years Smoking History Calculation : 38.5 pack year smoking history (# packs/per year x # years smoked)  Recent History of coughing up blood  no  Unexplained weight loss? no ( >Than 15 pounds within the last 6 months )  Prior History Lung / other cancer no (Diagnosis within the last 5 years already requiring surveillance chest CT Scans).  Smoking Status Former Smoker  Former Smokers: Years since quit: 2.5 years   Quit Date: 08/26/12  Visit Components:  Discussion included one or more decision making aids. yes  Discussion included risk/benefits of screening. yes  Discussion included potential follow up diagnostic testing for abnormal scans. yes  Discussion included meaning and risk of over diagnosis. yes  Discussion included meaning and risk of False Positives. yes  Discussion included meaning of total radiation exposure. yes  Counseling Included:  Importance of adherence to annual lung cancer LDCT screening. yes  Impact of comorbidities on ability to participate in the program. yes  Ability and willingness to under diagnostic treatment. yes  Smoking Cessation Counseling:  Current Smokers:N/A   Discussed importance of smoking cessation. N/A  Information about tobacco cessation classes and interventions provided to patient. N/A  Patient provided with "ticket" for LDCT Scan: Yes  Symptomatic Patient. no  Counseling: N/A  Diagnosis Code: Tobacco Use Z72.0  Asymptomatic Patient yes  Counseling  Former Smokers:   Discussed the importance of maintaining cigarette abstinence. yes  Diagnosis Code: Personal History of Nicotine Dependence. T51.761  Information about tobacco cessation classes and interventions provided to patient. Yes  Patient provided with "ticket" for LDCT Scan. yes  Written Order for Lung Cancer Screening with LDCT placed in Epic.  Yes (CT Chest Lung Cancer Screening Low Dose W/O CM) [id number] Z12.2-Screening of respiratory organs Z87.891-Personal history of nicotine dependence  I spent 15 minutes of face to face interaction discussing the risks and benefits of lung cancer screening with this patient. She is a former smoker who quit 2.5 years ago. We discussed that remaining smoke free is the best action she can take to decrease her risk of developing lung cancer. We discussed the importance of lung cancer screening as an annual health screening maintenance as a past smoker, and the benefit of finding lung cancer early through screening. She verbalized understanding.We viewed a power point providing the risks and benefits of the program addressed above. We stopped at intervals to allow for discussion and questions as needed to ensure understanding. Take aways for this visit included my card and contact information, a copy of the power point with information for her to refer to as needed, and a ticket to ride the CT scan. She confirmed location and time of the CT scan appointment.She did not have any further questions about the program before leaving the office.   Magdalen Spatz, NP

## 2015-08-10 ENCOUNTER — Telehealth: Payer: Self-pay | Admitting: Family

## 2015-08-10 ENCOUNTER — Ambulatory Visit (HOSPITAL_BASED_OUTPATIENT_CLINIC_OR_DEPARTMENT_OTHER)
Admission: RE | Admit: 2015-08-10 | Discharge: 2015-08-10 | Disposition: A | Discharge status: Home / Self Care | Payer: Medicare Other | Source: Ambulatory Visit | Attending: Family | Admitting: Family

## 2015-08-10 ENCOUNTER — Telehealth: Payer: Self-pay | Admitting: Family Medicine

## 2015-08-10 ENCOUNTER — Ambulatory Visit (INDEPENDENT_AMBULATORY_CARE_PROVIDER_SITE_OTHER): Payer: Medicare Other | Admitting: Family

## 2015-08-10 ENCOUNTER — Encounter: Payer: Self-pay | Admitting: Family

## 2015-08-10 VITALS — BP 118/70 | HR 79 | Temp 97.9°F | Resp 16 | Ht 63.0 in | Wt 118.2 lb

## 2015-08-10 DIAGNOSIS — J209 Acute bronchitis, unspecified: Secondary | ICD-10-CM | POA: Diagnosis not present

## 2015-08-10 DIAGNOSIS — H6690 Otitis media, unspecified, unspecified ear: Secondary | ICD-10-CM | POA: Insufficient documentation

## 2015-08-10 DIAGNOSIS — R05 Cough: Secondary | ICD-10-CM

## 2015-08-10 DIAGNOSIS — I700 Atherosclerosis of aorta: Secondary | ICD-10-CM | POA: Insufficient documentation

## 2015-08-10 DIAGNOSIS — R059 Cough, unspecified: Secondary | ICD-10-CM

## 2015-08-10 DIAGNOSIS — H66001 Acute suppurative otitis media without spontaneous rupture of ear drum, right ear: Secondary | ICD-10-CM

## 2015-08-10 MED ORDER — CEFDINIR 300 MG PO CAPS: 300.0000 mg | ORAL_CAPSULE | Freq: Two times a day (BID) | ORAL | Status: DC

## 2015-08-10 MED ORDER — ALBUTEROL SULFATE HFA 108 (90 BASE) MCG/ACT IN AERS: 2.0000 | INHALATION_SPRAY | Freq: Four times a day (QID) | RESPIRATORY_TRACT | Status: DC | PRN

## 2015-08-10 NOTE — Progress Notes (Signed)
Pre visit review using our clinic review tool, if applicable. No additional management support is needed unless otherwise documented below in the visit note. 

## 2015-08-10 NOTE — Telephone Encounter (Signed)
error 

## 2015-08-10 NOTE — Assessment & Plan Note (Signed)
Intolerant to PEN, will rx with cefdinir.

## 2015-08-10 NOTE — Telephone Encounter (Signed)
CXR shows mild bronchitis changes, no Pneumonia. Advise pt to start cefdinir and prn albuterol as we discussed at her visit.

## 2015-08-10 NOTE — Telephone Encounter (Signed)
Notified pt and she voices understanding. 

## 2015-08-10 NOTE — Patient Instructions (Addendum)
Please complete chest x ray on the first floor. Start cefdinir for your ear infection. You may use albuterol every 6 hours as needed for cough/wheezing.  Call if you develop fever > 101, if symptoms worsen, or if not improved in 3 days.

## 2015-08-10 NOTE — Progress Notes (Signed)
Subjective:    Patient ID: Samantha Miranda, female    DOB: May 04, 1946, 69 y.o.   MRN: [id number]  HPI  Ms. Tomassetti is a 69 yr old female who presents today with chief complaint of cough. Reports symptoms began 4 days ago.   R ear has been sore. She denies fever.  She reports that she took her temperature a few times and had a mild temp, (1 point elevated." + sore throat.  No nasal congestion.  Denies sick contacts.    Review of Systems See HPI  Past Medical History  Diagnosis Date  . Cancer     [place] cancer  . Hyperlipidemia   . Osteopenia     Social History   Social History  . Marital Status: Married    Spouse Name: N/A  . Number of Children: N/A  . Years of Education: N/A   Occupational History  .  [employer]    retired   Social History Main Topics  . Smoking status: Current Every Day Smoker -- 1.00 packs/day for 40 years  . Smokeless tobacco: Former Systems developer    Quit date: 08/26/2012     Comment: Pt currently using lowest strength vapes.  . Alcohol Use: No  . Drug Use: No  . Sexual Activity: Not on file   Other Topics Concern  . Not on file   Social History Narrative    Past Surgical History  Procedure Laterality Date  . Abdominal hysterectomy  1991  . Breast lumpectomy  9/07  . Cataract extraction, bilateral    . Eye surgery  08/2013  10/2013    cataracts    Family History  Problem Relation Age of Onset  . Colon cancer    . Breast cancer    . Lung cancer    . Diabetes Maternal Grandmother   . Heart failure Mother     CHF  . Hyperlipidemia Mother   . Heart attack Neg Hx   . Hypertension Neg Hx   . Sudden death Neg Hx   . Lung cancer Father 28    lung  . Cancer Sister 31    breast    Allergies  Allergen Reactions  . Amoxicillin-Pot Clavulanate Nausea And Vomiting  . Moxifloxacin Nausea And Vomiting    Current Outpatient Prescriptions on File Prior to Visit  Medication Sig Dispense Refill  . Cholecalciferol (VITAMIN D) 2000  UNITS tablet Take 4,000 Units by mouth daily.    . Coenzyme Q10 (COQ10 PO) Take 1 each by mouth daily.    Marland Kitchen FLUoxetine (PROZAC) 10 MG tablet take 1 tablet by mouth once daily 30 tablet 5  . hydrocortisone-pramoxine (ANALPRAM-HC) 2.5-1 % rectal cream apply to affected area three times a day 30 g 1  . MAGNESIUM PO Take 1 each by mouth daily.    . Probiotic Product (PROBIOTIC DAILY PO) Take 1 tablet by mouth daily.    . RESTASIS 0.05 % ophthalmic emulsion Place 1 drop into both eyes 2 (two) times daily.  0  . ZETIA 10 MG tablet take 1 tablet by mouth once daily 30 tablet 5  . zolpidem (AMBIEN) 5 MG tablet take 1 tablet by mouth at bedtime if needed for sleep 30 tablet 0   No current facility-administered medications on file prior to visit.    BP 118/70 mmHg  Pulse 79  Temp(Src) 97.9 F (36.6 C) (Oral)  Resp 16  Ht 5\' 3"  (1.6 m)  Wt 118 lb 3.2 oz (53.615  kg)  BMI 20.94 kg/m2  SpO2 97%       Objective:   Physical Exam  Constitutional: She is oriented to person, place, and time. She appears well-developed and well-nourished.  HENT:  Head: Normocephalic and atraumatic.  Right Ear: Ear canal normal. Tympanic membrane is scarred.  Left Ear: Ear canal normal. Tympanic membrane is scarred.  Mouth/Throat: Posterior oropharyngeal erythema present. No oropharyngeal exudate.  R TM appears to have some purulence behind the TM.   Cardiovascular: Normal rate, regular rhythm and normal heart sounds.   No murmur heard. Pulmonary/Chest: Effort normal and breath sounds normal. No respiratory distress.  + few wheezes noted.    Musculoskeletal: She exhibits no edema.  Neurological: She is alert and oriented to person, place, and time.  Skin: Skin is warm and dry. No rash noted. No erythema. No pallor.  Psychiatric: She has a normal mood and affect. Her behavior is normal. Judgment and thought content normal.          Assessment & Plan:

## 2015-08-10 NOTE — Assessment & Plan Note (Signed)
Add albuterol prn, rx cefdinir, obtain CXR to exclude pneumonia.

## 2015-09-23 ENCOUNTER — Telehealth: Payer: Self-pay

## 2015-09-23 NOTE — Telephone Encounter (Signed)
Left msg to schedule AWV 12/12 at 9:30

## 2015-11-02 ENCOUNTER — Ambulatory Visit: Payer: Medicare Other

## 2015-11-02 ENCOUNTER — Ambulatory Visit (INDEPENDENT_AMBULATORY_CARE_PROVIDER_SITE_OTHER): Payer: Medicare Other | Admitting: Family Medicine

## 2015-11-02 ENCOUNTER — Encounter: Payer: Self-pay | Admitting: Family Medicine

## 2015-11-02 VITALS — BP 122/76 | HR 81 | Temp 97.9°F | Ht 63.0 in | Wt 120.0 lb

## 2015-11-02 DIAGNOSIS — Z23 Encounter for immunization: Secondary | ICD-10-CM | POA: Diagnosis not present

## 2015-11-02 DIAGNOSIS — Z1159 Encounter for screening for other viral diseases: Secondary | ICD-10-CM | POA: Diagnosis not present

## 2015-11-02 DIAGNOSIS — E785 Hyperlipidemia, unspecified: Secondary | ICD-10-CM

## 2015-11-02 NOTE — Patient Instructions (Addendum)
Continue to eat heart healthy diet (full of fruits, vegetables, whole grains, lean protein, water--limit salt, fat, and sugar intake) and increase physical activity as tolerated.  Continue doing brain stimulating activities (puzzles, reading, adult coloring books, staying active) to keep memory sharp.   Go to lab for lab work.  Call back when ready to schedule colonoscopy.    Follow up with Dr. Etter Sjogren as discussed.     Cholesterol Cholesterol is a white, waxy, fat-like substance needed by your body in small amounts. The liver makes all the cholesterol you need. Cholesterol is carried from the liver by the blood through the blood vessels. Deposits of cholesterol (plaque) may build up on blood vessel walls. These make the arteries narrower and stiffer. Cholesterol plaques increase the risk for heart attack and stroke.  You cannot feel your cholesterol level even if it is very high. The only way to know it is high is with a blood test. Once you know your cholesterol levels, you should keep a record of the test results. Work with your health care provider to keep your levels in the desired range.  WHAT DO THE RESULTS MEAN?  Total cholesterol is a rough measure of all the cholesterol in your blood.   LDL is the so-called bad cholesterol. This is the type that deposits cholesterol in the walls of the arteries. You want this level to be low.   HDL is the good cholesterol because it cleans the arteries and carries the LDL away. You want this level to be high.  Triglycerides are fat that the body can either burn for energy or store. High levels are closely linked to heart disease.  WHAT ARE THE DESIRED LEVELS OF CHOLESTEROL?  Total cholesterol below 200.   LDL below 100 for people at risk, below 70 for those at very high risk.   HDL above 50 is good, above 60 is best.   Triglycerides below 150.  HOW CAN I LOWER MY CHOLESTEROL?  Diet. Follow your diet programs as directed by your health  care provider.   Choose fish or white meat chicken and [place], roasted or baked. Limit fatty cuts of red meat, fried foods, and processed meats, such as sausage and lunch meats.   Eat lots of fresh fruits and vegetables.  Choose whole grains, beans, pasta, potatoes, and cereals.   Use only small amounts of olive, corn, or canola oils.   Avoid butter, mayonnaise, shortening, or palm kernel oils.  Avoid foods with trans fats.   Drink skim or nonfat milk and eat low-fat or nonfat yogurt and cheeses. Avoid whole milk, cream, ice cream, egg yolks, and full-fat cheeses.   Healthy desserts include angel food cake, ginger snaps, animal crackers, hard candy, popsicles, and low-fat or nonfat frozen yogurt. Avoid pastries, cakes, pies, and cookies.   Exercise. Follow your exercise programs as directed by your health care provider.   A regular program helps decrease LDL and raise HDL.   A regular program helps with weight control.   Do things that increase your activity level like gardening, walking, or taking the stairs. Ask your health care provider about how you can be more active in your daily life.   Medicine. Take medicine only as directed by your health care provider.   Medicine may be prescribed by your health care provider to help lower cholesterol and decrease the risk for heart disease.   If you have several risk factors, you may need medicine even if your levels are  normal.   This information is not intended to replace advice given to you by your health care provider. Make sure you discuss any questions you have with your health care provider.   Document Released: 08/02/2001 Document Revised: 11/28/2014 Document Reviewed: 08/21/2013 Elsevier Interactive Patient Education [employer].

## 2015-11-02 NOTE — Assessment & Plan Note (Signed)
Flu shot given

## 2015-11-02 NOTE — Progress Notes (Signed)
Subjective:   Samantha Miranda is a 69 y.o. female who presents for Medicare Annual (Subsequent) preventive examination.  Review of Systems: No ROS Cardiac Risk Factors include: advanced age (>45men, >39 women) Sleep patterns:  Sleeps at least 7 hours/does not wake up during the night to void Home Safety/Smoke Alarms:  Feels safe at home.  Lives with husband in 2 story home.  Smoke alarms.   Firearm Safety:  Kept in safe place.   Seat Belt Safety/Bike Helmet:  Always wears seat belt.    Counseling:   Eye Exam- 01/2014 Dental- Goes every 6 months.   Female:  Pap-Hysterectomy      Mammo-02/23/15-negative     Dexa scan-Declined       CCS-10/24/11    Objective:   Vitals: BP 122/76 mmHg  Pulse 81  Temp(Src) 97.9 F (36.6 C) (Oral)  Ht 5\' 3"  (1.6 m)  Wt 120 lb (54.432 kg)  BMI 21.26 kg/m2  SpO2 97%  Tobacco History  Smoking status  . Former Smoker -- 1.00 packs/day for 40 years  . Quit date: 05/03/2015  Smokeless tobacco  . Former Systems developer  . Quit date: 08/26/2012    Comment: Pt currently using lowest strength vapes.  11/02/2015     Counseling given: Yes   Past Medical History  Diagnosis Date  . Cancer [facility])     Breast cancer  . Hyperlipidemia   . Osteopenia    Past Surgical History  Procedure Laterality Date  . Abdominal hysterectomy  1991  . Breast lumpectomy  9/07  . Cataract extraction, bilateral    . Eye surgery  08/2013  10/2013    cataracts   Family History  Problem Relation Age of Onset  . Colon cancer Maternal Grandfather   . Breast cancer      Sister  . Lung cancer    . Diabetes Maternal Grandmother   . Heart failure Mother     CHF  . Hyperlipidemia Mother   . Heart attack Neg Hx   . Hypertension Neg Hx   . Sudden death Neg Hx   . Lung cancer Father 28    lung  . Cancer Sister 75    breast   History  Sexual Activity  . Sexual Activity: Not on file    Outpatient Encounter Prescriptions as of 11/02/2015  Medication Sig  . albuterol (PROVENTIL  HFA;VENTOLIN HFA) 108 (90 BASE) MCG/ACT inhaler Inhale 2 puffs into the lungs every 6 (six) hours as needed for wheezing or shortness of breath.  . Cholecalciferol (VITAMIN D) 2000 UNITS tablet Take 4,000 Units by mouth daily.  . Coenzyme Q10 (COQ10 PO) Take 1 each by mouth daily.  Marland Kitchen FLUoxetine (PROZAC) 10 MG tablet take 1 tablet by mouth once daily  . hydrocortisone-pramoxine (ANALPRAM-HC) 2.5-1 % rectal cream apply to affected area three times a day  . MAGNESIUM PO Take 1 each by mouth daily.  . Probiotic Product (PROBIOTIC DAILY PO) Take 1 tablet by mouth daily.  . RESTASIS 0.05 % ophthalmic emulsion Place 1 drop into both eyes 2 (two) times daily.  Marland Kitchen ZETIA 10 MG tablet take 1 tablet by mouth once daily  . zolpidem (AMBIEN) 5 MG tablet take 1 tablet by mouth at bedtime if needed for sleep  . [DISCONTINUED] cefdinir (OMNICEF) 300 MG capsule Take 1 capsule (300 mg total) by mouth 2 (two) times daily.   No facility-administered encounter medications on file as of 11/02/2015.    Activities of Daily Living In your  present state of health, do you have any difficulty performing the following activities: 11/02/2015 04/21/2015  Hearing? [name]  Vision? N N  Difficulty concentrating or making decisions? N N  Walking or climbing stairs? N N  Dressing or bathing? N N  Doing errands, shopping? N N  Preparing Food and eating ? N -  Using the Toilet? N -  In the past six months, have you accidently leaked urine? N -  Do you have problems with loss of bowel control? N -  Managing your Medications? N -  Managing your Finances? N -  Housekeeping or managing your Housekeeping? N -    Patient Care Team: Rosalita Chessman, DO as PCP - General Calton Dach, MD as Referring Physician (Optometry) Izora Ribas (Dermatology)   Dr. Sherie Don Assessment:   Exercise Activities and Dietary recommendations Current Exercise Habits:: Home exercise routine, Frequency (Times/Week): 6   Diet: Eats 1  meal per day; 2 cups of coffee; Regular diet.    Goals    . Receive flu shot today.     . Remain active and independent.       Continue with current routine.        Fall Risk Fall Risk  11/02/2015 04/21/2015 03/03/2014 02/06/2013  Falls in the past year? No No No No   Depression Screen PHQ 2/9 Scores 11/02/2015 04/21/2015 03/03/2014 02/06/2013  PHQ - 2 Score 0 0 0 2  PHQ- 9 Score - - - 6  Exception Documentation - Patient refusal - -     Cognitive Testing: AD8 SCREENING:  0/8  Immunization History  Administered Date(s) Administered  . Influenza Split 09/22/2011  . Influenza, High Dose Seasonal PF 10/10/2014  . Pneumococcal Conjugate-13 04/21/2015  . Pneumococcal Polysaccharide-23 03/03/2014  . Td 08/26/2010  . Zoster 04/02/2009   Screening Tests Health Maintenance  Topic Date Due  . INFLUENZA VACCINE  06/22/2015  . DEXA SCAN  04/26/2016 (Originally 03/08/2012)  . Hepatitis C Screening  08/09/2016 (Originally Oct 24, 1946)  . MAMMOGRAM  02/22/2017  . TETANUS/TDAP  08/26/2020  . COLONOSCOPY  10/23/2021  . ZOSTAVAX  Completed  . PNA vac Low Risk Adult  Completed      Plan:  Continue to eat heart healthy diet (full of fruits, vegetables, whole grains, lean protein, water--limit salt, fat, and sugar intake) and increase physical activity as tolerated.  Continue doing brain stimulating activities (puzzles, reading, adult coloring books, staying active) to keep memory sharp.   Go to lab for lab work.  Call back when ready to schedule colonoscopy.    Follow up with Dr. Etter Sjogren as discussed.     During the course of the visit the patient was educated and counseled about the following appropriate screening and preventive services:   Vaccines to include Pneumoccal, Influenza, Hepatitis B, Td, Zostavax, HCV  Electrocardiogram  Cardiovascular Disease  Colorectal cancer screening  Bone density screening  Diabetes screening  Glaucoma  screening  Mammography/PAP  Nutrition counseling   Patient Instructions (the written plan) was given to the patient.   Rudene Anda, RN  11/02/2015

## 2015-11-02 NOTE — Progress Notes (Signed)
Pre visit review using our clinic review tool, if applicable. No additional management support is needed unless otherwise documented below in the visit note. 

## 2015-11-02 NOTE — Assessment & Plan Note (Signed)
con't zetia Check labs

## 2015-11-02 NOTE — Progress Notes (Signed)
Patient ID: Samantha Miranda, female    DOB: [birth year]  Age: 69 y.o. MRN: [id number]    Subjective:  Subjective HPI Samantha Miranda presents for f/u cholesterol.  No other complaints.    Review of Systems  Constitutional: Negative for diaphoresis, appetite change, fatigue and unexpected weight change.  Eyes: Negative for pain, redness and visual disturbance.  Respiratory: Negative for cough, chest tightness, shortness of breath and wheezing.   Cardiovascular: Negative for chest pain, palpitations and leg swelling.  Endocrine: Negative for cold intolerance, heat intolerance, polydipsia, polyphagia and polyuria.  Genitourinary: Negative for dysuria, frequency and difficulty urinating.  Neurological: Negative for dizziness, light-headedness, numbness and headaches.  All other systems reviewed and are negative.   History Past Medical History  Diagnosis Date  . Cancer [facility])     Breast cancer  . Hyperlipidemia   . Osteopenia     She has past surgical history that includes Abdominal hysterectomy (1991); Breast lumpectomy (9/07); Cataract extraction, bilateral; and Eye surgery (08/2013  10/2013).   Her family history includes Breast cancer in an other family member; Cancer (age of onset: 72) in her sister; Colon cancer in her maternal grandfather; Diabetes in her maternal grandmother; Heart failure in her mother; Hyperlipidemia in her mother; Lung cancer in an other family member; Lung cancer (age of onset: 73) in her father. There is no history of Heart attack, Hypertension, or Sudden death.She reports that she quit smoking about 6 months ago. She quit smokeless tobacco use about 3 years ago. She reports that she does not drink alcohol or use illicit drugs.  Current Outpatient Prescriptions on File Prior to Visit  Medication Sig Dispense Refill  . albuterol (PROVENTIL HFA;VENTOLIN HFA) 108 (90 BASE) MCG/ACT inhaler Inhale 2 puffs into the lungs every 6 (six) hours as needed for wheezing or  shortness of breath. 1 Inhaler 0  . Cholecalciferol (VITAMIN D) 2000 UNITS tablet Take 4,000 Units by mouth daily.    . Coenzyme Q10 (COQ10 PO) Take 1 each by mouth daily.    Marland Kitchen FLUoxetine (PROZAC) 10 MG tablet take 1 tablet by mouth once daily 30 tablet 5  . hydrocortisone-pramoxine (ANALPRAM-HC) 2.5-1 % rectal cream apply to affected area three times a day 30 g 1  . MAGNESIUM PO Take 1 each by mouth daily.    . Probiotic Product (PROBIOTIC DAILY PO) Take 1 tablet by mouth daily.    . RESTASIS 0.05 % ophthalmic emulsion Place 1 drop into both eyes 2 (two) times daily.  0  . ZETIA 10 MG tablet take 1 tablet by mouth once daily 30 tablet 5  . zolpidem (AMBIEN) 5 MG tablet take 1 tablet by mouth at bedtime if needed for sleep 30 tablet 0   No current facility-administered medications on file prior to visit.     Objective:  Objective Physical Exam  Constitutional: She is oriented to person, place, and time. She appears well-developed and well-nourished.  HENT:  Head: Normocephalic and atraumatic.  Eyes: Conjunctivae and EOM are normal.  Neck: Normal range of motion. Neck supple. No JVD present. Carotid bruit is not present. No thyromegaly present.  Cardiovascular: Normal rate, regular rhythm and normal heart sounds.   No murmur heard. Pulmonary/Chest: Effort normal and breath sounds normal. No respiratory distress. She has no wheezes. She has no rales. She exhibits no tenderness.  Musculoskeletal: She exhibits no edema.  Neurological: She is alert and oriented to person, place, and time.  Psychiatric: She has a normal mood and  affect.  Vitals reviewed.  BP 122/76 mmHg  Pulse 81  Temp(Src) 97.9 F (36.6 C) (Oral)  Ht 5\' 3"  (1.6 m)  Wt 120 lb (54.432 kg)  BMI 21.26 kg/m2  SpO2 97% Wt Readings from Last 3 Encounters:  11/02/15 120 lb (54.432 kg)  08/10/15 118 lb 3.2 oz (53.615 kg)  04/21/15 118 lb 9.6 oz (53.797 kg)     Lab Results  Component Value Date   WBC 6.1 04/21/2015    HGB 13.2 04/21/2015   HCT 39.0 04/21/2015   PLT 229.0 04/21/2015   GLUCOSE 79 04/21/2015   CHOL 204* 04/21/2015   TRIG 73.0 04/21/2015   HDL 62.80 04/21/2015   LDLDIRECT 176.2 08/12/2013   LDLCALC 127* 04/21/2015   ALT 13 04/21/2015   AST 20 04/21/2015   NA 138 04/21/2015   K 4.0 04/21/2015   CL 104 04/21/2015   CREATININE 0.81 04/21/2015   BUN 16 04/21/2015   CO2 28 04/21/2015   TSH 0.47 08/12/2013   MICROALBUR 1.1 08/12/2013    Dg Chest 2 View  08/10/2015  CLINICAL DATA:  Cough for 4 days. EXAM: CHEST  2 VIEW COMPARISON:  08/12/2013 FINDINGS: Heart size and pulmonary vascularity are normal. No infiltrates or effusions. Slight peribronchial thickening. Slight thoracolumbar scoliosis, unchanged. Aortic atherosclerosis. IMPRESSION: Slight bronchitic changes.  Aortic atherosclerosis. Electronically Signed   By: Lorriane Shire M.D.   On: 08/10/2015 14:29     Assessment & Plan:  Plan I have discontinued Ms. Alers cefdinir. I am also having her maintain her Vitamin D, MAGNESIUM PO, Coenzyme Q10 (COQ10 PO), Probiotic Product (PROBIOTIC DAILY PO), zolpidem, RESTASIS, hydrocortisone-pramoxine, ZETIA, FLUoxetine, and albuterol.  No orders of the defined types were placed in this encounter.    Problem List Items Addressed This Visit    Influenza vaccine administered    Flu shot given      Hyperlipidemia - Primary    con't zetia Check labs       Other Visit Diagnoses    Need for hepatitis C screening test        Relevant Orders    Hepatitis C antibody    Encounter for immunization           Follow-up: Return in about 6 months (around 05/02/2016), or if symptoms worsen or fail to improve, for annual exam, fasting.  Garnet Koyanagi, DO

## 2015-11-03 LAB — HEPATITIS C ANTIBODY: HCV AB: NEGATIVE

## 2015-11-03 NOTE — Addendum Note (Signed)
Addended by: Rudene Anda on: 11/03/2015 01:06 PM   Modules accepted: Miquel Dunn

## 2016-02-09 ENCOUNTER — Other Ambulatory Visit: Payer: Self-pay | Admitting: Acute Care

## 2016-02-09 DIAGNOSIS — Z87891 Personal history of nicotine dependence: Secondary | ICD-10-CM

## 2016-02-15 ENCOUNTER — Encounter: Payer: Self-pay | Admitting: Family Medicine

## 2016-02-16 ENCOUNTER — Other Ambulatory Visit: Payer: Self-pay

## 2016-02-16 MED ORDER — FLUOXETINE HCL 10 MG PO TABS: 10.0000 mg | ORAL_TABLET | Freq: Every day | ORAL | Status: DC

## 2016-02-16 MED ORDER — HYDROCORTISONE ACE-PRAMOXINE 2.5-1 % RE CREA: TOPICAL_CREAM | RECTAL | Status: DC

## 2016-03-11 ENCOUNTER — Telehealth: Payer: Self-pay | Admitting: Acute Care

## 2016-03-11 NOTE — Telephone Encounter (Signed)
Appointment Request From: [name]       With Provider: Magdalen Spatz, NP [facility]]      Preferred Date Range: From 04/28/2016 To 05/20/2016      Reason for visit: Office Visit      Comments:   I would like to schedule the CT low dose lung scan. I had it last year on 04/29/2015.

## 2016-03-14 NOTE — Telephone Encounter (Signed)
Spoke with pt. Made her aware that we will schedule this CT when it gets closer to June 2017. Order has already been placed for this. Nothing further was needed at this time.

## 2016-03-19 ENCOUNTER — Emergency Department (HOSPITAL_BASED_OUTPATIENT_CLINIC_OR_DEPARTMENT_OTHER)
Admission: EM | Admit: 2016-03-19 | Discharge: 2016-03-19 | Disposition: A | Discharge status: Home / Self Care | Payer: Medicare Other | Attending: Emergency Medicine | Admitting: Emergency Medicine

## 2016-03-19 ENCOUNTER — Emergency Department (HOSPITAL_BASED_OUTPATIENT_CLINIC_OR_DEPARTMENT_OTHER): Payer: Medicare Other

## 2016-03-19 ENCOUNTER — Encounter (HOSPITAL_BASED_OUTPATIENT_CLINIC_OR_DEPARTMENT_OTHER): Payer: Self-pay | Admitting: *Deleted

## 2016-03-19 DIAGNOSIS — Z853 Personal history of malignant neoplasm of breast: Secondary | ICD-10-CM | POA: Insufficient documentation

## 2016-03-19 DIAGNOSIS — J189 Pneumonia, unspecified organism: Secondary | ICD-10-CM | POA: Diagnosis not present

## 2016-03-19 DIAGNOSIS — J45909 Unspecified asthma, uncomplicated: Secondary | ICD-10-CM | POA: Insufficient documentation

## 2016-03-19 DIAGNOSIS — E785 Hyperlipidemia, unspecified: Secondary | ICD-10-CM | POA: Diagnosis not present

## 2016-03-19 DIAGNOSIS — J4520 Mild intermittent asthma, uncomplicated: Secondary | ICD-10-CM

## 2016-03-19 DIAGNOSIS — Z87891 Personal history of nicotine dependence: Secondary | ICD-10-CM | POA: Diagnosis not present

## 2016-03-19 DIAGNOSIS — Z79899 Other long term (current) drug therapy: Secondary | ICD-10-CM | POA: Insufficient documentation

## 2016-03-19 DIAGNOSIS — R05 Cough: Secondary | ICD-10-CM | POA: Diagnosis present

## 2016-03-19 LAB — COMPREHENSIVE METABOLIC PANEL
ALBUMIN: 3.8 g/dL (ref 3.5–5.0)
ALT: 20 U/L (ref 14–54)
ANION GAP: 6 (ref 5–15)
AST: 26 U/L (ref 15–41)
Alkaline Phosphatase: 48 U/L (ref 38–126)
BUN: 14 mg/dL (ref 6–20)
CHLORIDE: 103 mmol/L (ref 101–111)
CO2: 25 mmol/L (ref 22–32)
Calcium: 9 mg/dL (ref 8.9–10.3)
Creatinine, Ser: 0.76 mg/dL (ref 0.44–1.00)
GFR calc Af Amer: >60 mL/min (ref >60)
Glucose, Bld: 124 mg/dL — ABNORMAL HIGH (ref 65–99)
POTASSIUM: 3.5 mmol/L (ref 3.5–5.1)
Sodium: 134 mmol/L — ABNORMAL LOW (ref 135–145)
TOTAL PROTEIN: 7.6 g/dL (ref 6.5–8.1)
Total Bilirubin: 0.6 mg/dL (ref 0.3–1.2)

## 2016-03-19 LAB — CBC WITH DIFFERENTIAL/PLATELET
BASOS ABS: 0 10*3/uL (ref 0.0–0.1)
BASOS PCT: 0 %
Eosinophils Absolute: 0.1 10*3/uL (ref 0.0–0.7)
Eosinophils Relative: 0 %
HEMATOCRIT: 38.5 % (ref 36.0–46.0)
Hemoglobin: 13.1 g/dL (ref 12.0–15.0)
LYMPHS PCT: 7 %
Lymphs Abs: 0.9 10*3/uL (ref 0.7–4.0)
MCH: 30.8 pg (ref 26.0–34.0)
MCHC: 34 g/dL (ref 30.0–36.0)
MCV: 90.4 fL (ref 78.0–100.0)
Monocytes Absolute: 0.7 10*3/uL (ref 0.1–1.0)
Monocytes Relative: 6 %
NEUTROS ABS: 10.5 10*3/uL — AB (ref 1.7–7.7)
NEUTROS PCT: 87 %
PLATELETS: 171 10*3/uL (ref 150–400)
RBC: 4.26 MIL/uL (ref 3.87–5.11)
RDW: 13.1 % (ref 11.5–15.5)
WBC: 12.2 10*3/uL — AB (ref 4.0–10.5)

## 2016-03-19 MED ORDER — IPRATROPIUM-ALBUTEROL 0.5-2.5 (3) MG/3ML IN SOLN
3.0000 mL | Freq: Four times a day (QID) | RESPIRATORY_TRACT | Status: DC
  Administered 2016-03-19: 3 mL via RESPIRATORY_TRACT
  Filled 2016-03-19: qty 3

## 2016-03-19 MED ORDER — AZITHROMYCIN 250 MG PO TABS: ORAL_TABLET | ORAL | Status: DC

## 2016-03-19 MED ORDER — CEPHALEXIN 500 MG PO CAPS: 500.0000 mg | ORAL_CAPSULE | Freq: Three times a day (TID) | ORAL | Status: DC

## 2016-03-19 MED ORDER — GUAIFENESIN-CODEINE 100-10 MG/5ML PO SOLN: 5.0000 mL | Freq: Four times a day (QID) | ORAL | Status: DC | PRN

## 2016-03-19 MED ORDER — DEXAMETHASONE SODIUM PHOSPHATE 10 MG/ML IJ SOLN
10.0000 mg | Freq: Once | INTRAMUSCULAR | Status: AC
  Administered 2016-03-19: 10 mg via INTRAVENOUS
  Filled 2016-03-19: qty 1

## 2016-03-19 MED ORDER — ALBUTEROL SULFATE HFA 108 (90 BASE) MCG/ACT IN AERS
2.0000 | INHALATION_SPRAY | Freq: Once | RESPIRATORY_TRACT | Status: AC
  Administered 2016-03-19: 2 via RESPIRATORY_TRACT
  Filled 2016-03-19: qty 6.7

## 2016-03-19 MED ORDER — SODIUM CHLORIDE 0.9 % IV BOLUS (SEPSIS)
1000.0000 mL | Freq: Once | INTRAVENOUS | Status: AC
  Administered 2016-03-19: 1000 mL via INTRAVENOUS

## 2016-03-19 NOTE — ED Notes (Signed)
Pt reports she developed a cough and n/v/d this past Wednesday; cough has continued but she didn't vomit again until this morning. Pt also reports nasal congestion/drainage and L eye drainage since Thursday. Denies fever. Reports no episodes of diarrhea since Wednesday.

## 2016-03-19 NOTE — ED Provider Notes (Signed)
CSN: [id number]     Arrival date & time 03/19/16  [id number] History   First MD Initiated Contact with Patient 03/19/16 [phone]     Chief Complaint  Patient presents with  . Cough     (Consider location/radiation/quality/duration/timing/severity/associated sxs/prior Treatment) HPI 70 year old female presents emergency Department with chief complaint of cough. Patient states that 5 days ago, she went out to eat with friends. When she arrived home, she felt extremely nauseated and laid down. She states that she thought she may have food poisoning and laid in bed for several hours until she began vomiting. She states that she had several episodes of vomiting and then seemed to develop a sudden upper respiratory infection. Since that time she's had significant painful cough. She states she is unable to produce phlegm, but feels that it is in her chest. She has been using Mucinex and NyQuil with minimal relief of her symptoms. She states that she feels that she has to cough constantly. She has a headache, mattering on the bilateral lashes, nasal and facial congestion, body aches and weakness. She is unsure if she is been running a fever. She denies urinary symptoms, abdominal symptoms. She has been able to eat and drink small amounts but states that she has had very decreased appetite due to her poorly. She feels. Past Medical History  Diagnosis Date  . Cancer [facility])     Breast cancer  . Hyperlipidemia   . Osteopenia    Past Surgical History  Procedure Laterality Date  . Abdominal hysterectomy  1991  . Breast lumpectomy  9/07  . Cataract extraction, bilateral    . Eye surgery  08/2013  10/2013    cataracts   Family History  Problem Relation Age of Onset  . Colon cancer Maternal Grandfather   . Breast cancer      Sister  . Lung cancer    . Diabetes Maternal Grandmother   . Heart failure Mother     CHF  . Hyperlipidemia Mother   . Heart attack Neg Hx   . Hypertension Neg Hx   . Sudden death Neg  Hx   . Lung cancer Father 28    lung  . Cancer Sister 14    breast   Social History  Substance Use Topics  . Smoking status: Former Smoker -- 1.00 packs/day for 40 years    Quit date: 05/03/2015  . Smokeless tobacco: Former Systems developer    Quit date: 08/26/2012     Comment: Pt currently using lowest strength vapes.  11/02/2015  . Alcohol Use: No   OB History    No data available     Review of Systems  Ten systems reviewed and are negative for acute change, except as noted in the HPI.    Allergies  Amoxicillin-pot clavulanate and Moxifloxacin  Home Medications   Prior to Admission medications   Medication Sig Start Date End Date Taking? Authorizing Provider  Cholecalciferol (VITAMIN D) 2000 UNITS tablet Take 4,000 Units by mouth daily.   Yes Historical Provider, MD  Coenzyme Q10 (COQ10 PO) Take 1 each by mouth daily.   Yes Historical Provider, MD  FLUoxetine (PROZAC) 10 MG tablet Take 1 tablet (10 mg total) by mouth daily. 02/16/16  Yes Yvonne R Lowne Chase, DO  MAGNESIUM PO Take 1 each by mouth daily.   Yes Historical Provider, MD  Probiotic Product (PROBIOTIC DAILY PO) Take 1 tablet by mouth daily.   Yes Historical Provider, MD  albuterol (PROVENTIL HFA;VENTOLIN HFA) 108 (  90 BASE) MCG/ACT inhaler Inhale 2 puffs into the lungs every 6 (six) hours as needed for wheezing or shortness of breath. 08/10/15   Debbrah Alar, NP  hydrocortisone-pramoxine [facility]) 2.5-1 % rectal cream apply to affected area three times a day 02/16/16   Alferd Apa Lowne Chase, DO   BP 145/84 mmHg  Pulse 118  Temp(Src) 98.6 F (37 C) (Oral)  Resp 20  Ht 5\' 4"  (1.626 m)  Wt 53.978 kg  BMI 20.42 kg/m2  SpO2 95% Physical Exam  Constitutional: She is oriented to person, place, and time. She appears well-developed and well-nourished. No distress.  HENT:  Head: Normocephalic and atraumatic.  Eyes: Conjunctivae and EOM are normal. Pupils are equal, round, and reactive to light. No scleral icterus.   Last mattering, no injection of conjunctivae  Neck: Normal range of motion.  Cardiovascular: Normal rate, regular rhythm and normal heart sounds.  Exam reveals no gallop and no friction rub.   No murmur heard. Pulmonary/Chest: Effort normal. She has wheezes.  Deep rattling painful sounding cough with rhonchi. She has diffuse inspiratory wheezing.  Abdominal: Soft. Bowel sounds are normal. She exhibits no distension and no mass. There is no tenderness. There is no guarding.  Neurological: She is alert and oriented to person, place, and time.  Skin: Skin is warm and dry. She is not diaphoretic.  Nursing note and vitals reviewed.   ED Course  Procedures (including critical care time) Labs Review Labs Reviewed  CBC WITH DIFFERENTIAL/PLATELET  COMPREHENSIVE METABOLIC PANEL    Imaging Review No results found. I have personally reviewed and evaluated these images and lab results as part of my medical decision-making.   EKG Interpretation None      MDM   Final diagnoses:  None    10:35 AM Patient with mild tachycardia, coughing. Wheezing. Patient will receive chest x-ray, IV fluids, Decadron. She also received a DuoNeb and basic labs.  Patient chest x-ray shows community-acquired pneumonia. Her heart rate has improved with fluids. Patient was able to ambulate in the emergency department without decrease in her oxygen saturations. Patient would like to try going home on antibiotics. I will discharge with codeine cough syrup, albuterol with spacer. She is given Decadron here in the emergency department. I discussed return precautions with the patient very specifically. If she does not return for worsening symptoms. She is to follow-up with her primary care doctor in the next 2-3 days. She appears safe for discharge at this time. Patient case discussed with Dr. Dayna Barker who agrees with plan of care.  Margarita Mail, PA-C 03/19/16 1719  Merrily Pew, MD 03/20/16 [phone]

## 2016-03-19 NOTE — Discharge Instructions (Signed)
You may use your inhaler 1-2 puffs every 4 to 6 hours.  you will be taking 2 antibiotics. Please complete the entire course. Return to the ER for the reasons we discussed.. Please follow up with your doctor early this week otherwise. Metered Dose Inhaler With Spacer Inhaled medicines are the basis of treatment of asthma and other breathing problems. Inhaled medicine can only be effective if used properly. Good technique assures that the medicine reaches the lungs. Your health care provider has asked you to use a spacer with your inhaler to help you take the medicine more effectively. A spacer is a plastic tube with a mouthpiece on one end and an opening that connects to the inhaler on the other end. Metered dose inhalers (MDIs) are used to deliver a variety of inhaled medicines. These include quick relief or rescue medicines (such as bronchodilators) and controller medicines (such as corticosteroids). The medicine is delivered by pushing down on a metal canister to release a set amount of spray. If you are using different kinds of inhalers, use your quick relief medicine to open the airways 10-15 minutes before using a steroid if instructed to do so by your health care provider. If you are unsure which inhalers to use and the order of using them, ask your health care provider, nurse, or respiratory therapist. HOW TO USE THE INHALER WITH A SPACER  Remove cap from inhaler.  If you are using the inhaler for the first time, you will need to prime it. Shake the inhaler for 5 seconds and release four puffs into the air, away from your face. Ask your health care provider or pharmacist if you have questions about priming your inhaler.  Shake inhaler for 5 seconds before each breath in (inhalation).  Place the open end of the spacer onto the mouthpiece of the inhaler.  Position the inhaler so that the top of the canister faces up and the spacer mouthpiece faces you.  Put your index finger on the top of the  medicine canister. Your thumb supports the bottom of the inhaler and the spacer.  Breathe out (exhale) normally and as completely as possible.  Immediately after exhaling, place the spacer between your teeth and into your mouth. Close your mouth tightly around the spacer.  Press the canister down with the index finger to release the medicine.  At the same time as the canister is pressed, inhale deeply and slowly until the lungs are completely filled. This should take 4-6 seconds. Keep your tongue down and out of the way.  Hold the medicine in your lungs for 5-10 seconds (10 seconds is best). This helps the medicine get into the small airways of your lungs. Exhale.  Repeat inhaling deeply through the spacer mouthpiece. Again hold that breath for up to 10 seconds (10 seconds is best). Exhale slowly. If it is difficult to take this second deep breath through the spacer, breathe normally several times through the spacer. Remove the spacer from your mouth.  Wait at least 15-30 seconds between puffs. Continue with the above steps until you have taken the number of puffs your health care provider has ordered. Do not use the inhaler more than your health care provider directs you to.  Remove spacer from the inhaler and place cap on inhaler.  Follow the directions from your health care provider or the inhaler insert for cleaning the inhaler and spacer. If you are using a steroid inhaler, rinse your mouth with water after your last puff, gargle,  and spit out the water. Do not swallow the water. AVOID:  Inhaling before or after starting the spray of medicine. It takes practice to coordinate your breathing with triggering the spray.  Inhaling through the nose (rather than the mouth) when triggering the spray. HOW TO DETERMINE IF YOUR INHALER IS FULL OR NEARLY EMPTY You cannot know when an inhaler is empty by shaking it. A few inhalers are now being made with dose counters. Ask your health care provider  for a prescription that has a dose counter if you feel you need that extra help. If your inhaler does not have a counter, ask your health care provider to help you determine the date you need to refill your inhaler. Write the refill date on a calendar or your inhaler canister. Refill your inhaler 7-10 days before it runs out. Be sure to keep an adequate supply of medicine. This includes making sure it is not expired, and you have a spare inhaler.  SEEK MEDICAL CARE IF:   Symptoms are only partially relieved with your inhaler.  You are having trouble using your inhaler.  You experience some increase in phlegm. SEEK IMMEDIATE MEDICAL CARE IF:   You feel little or no relief with your inhalers. You are still wheezing and are feeling shortness of breath or tightness in your chest or both.  You have dizziness, headaches, or fast heart rate.  You have chills, fever, or night sweats.  There is a noticeable increase in phlegm production, or there is blood in the phlegm.   This information is not intended to replace advice given to you by your health care provider. Make sure you discuss any questions you have with your health care provider.   Document Released: 11/07/2005 Document Revised: 03/24/2015 Document Reviewed: 04/25/2013 Elsevier Interactive Patient Education 2016 [place] Pneumonia, Adult Pneumonia is an infection of the lungs. There are different types of pneumonia. One type can develop while a person is in a hospital. A different type, called community-acquired pneumonia, develops in people who are not, or have not recently been, in the hospital or other health care facility.  CAUSES Pneumonia may be caused by bacteria, viruses, or funguses. Community-acquired pneumonia is often caused by Streptococcus pneumonia bacteria. These bacteria are often passed from one person to another by breathing in droplets from the cough or sneeze of an infected person. RISK  FACTORS The condition is more likely to develop in:  People who havechronic diseases, such as chronic obstructive pulmonary disease (COPD), asthma, congestive heart failure, cystic fibrosis, diabetes, or kidney disease.  People who haveearly-stage or late-stage HIV.  People who havesickle cell disease.  People who havehad their spleen removed (splenectomy).  People who havepoor Human resources officer.  People who havemedical conditions that increase the risk of breathing in (aspirating) secretions their own mouth and nose.   People who havea weakened immune system (immunocompromised).  People who smoke.  People whotravel to areas where pneumonia-causing germs commonly exist.  People whoare around animal habitats or animals that have pneumonia-causing germs, including birds, bats, rabbits, cats, and farm animals. SYMPTOMS Symptoms of this condition include:  Adry cough.  A wet (productive) cough.  Fever.  Sweating.  Chest pain, especially when breathing deeply or coughing.  Rapid breathing or difficulty breathing.  Shortness of breath.  Shaking chills.  Fatigue.  Muscle aches. DIAGNOSIS Your health care provider will take a medical history and perform a physical exam. You may also have other tests, including:  Imaging studies  of your chest, including X-rays.  Tests to check your blood oxygen level and other blood gases.  Other tests on blood, mucus (sputum), fluid around your lungs (pleural fluid), and urine. If your pneumonia is severe, other tests may be done to identify the specific cause of your illness. TREATMENT The type of treatment that you receive depends on many factors, such as the cause of your pneumonia, the medicines you take, and other medical conditions that you have. For most adults, treatment and recovery from pneumonia may occur at home. In some cases, treatment must happen in a hospital. Treatment may include:  Antibiotic medicines, if the  pneumonia was caused by bacteria.  Antiviral medicines, if the pneumonia was caused by a virus.  Medicines that are given by mouth or through an IV tube.  Oxygen.  Respiratory therapy. Although rare, treating severe pneumonia may include:  Mechanical ventilation. This is done if you are not breathing well on your own and you cannot maintain a safe blood oxygen level.  Thoracentesis. This procedureremoves fluid around one lung or both lungs to help you breathe better. HOME CARE INSTRUCTIONS  Take over-the-counter and prescription medicines only as told by your health care provider.  Only takecough medicine if you are losing sleep. Understand that cough medicine can prevent your body's natural ability to remove mucus from your lungs.  If you were prescribed an antibiotic medicine, take it as told by your health care provider. Do not stop taking the antibiotic even if you start to feel better.  Sleep in a semi-upright position at night. Try sleeping in a reclining chair, or place a few pillows under your head.  Do not use tobacco products, including cigarettes, chewing tobacco, and e-cigarettes. If you need help quitting, ask your health care provider.  Drink enough water to keep your urine clear or pale yellow. This will help to thin out mucus secretions in your lungs. PREVENTION There are ways that you can decrease your risk of developing community-acquired pneumonia. Consider getting a pneumococcal vaccine if:  You are older than 69 years of age.  You are older than 69 years of age and are undergoing cancer treatment, have chronic lung disease, or have other medical conditions that affect your immune system. Ask your health care provider if this applies to you. There are different types and schedules of pneumococcal vaccines. Ask your health care provider which vaccination option is best for you. You may also prevent community-acquired pneumonia if you take these actions:  Get  an influenza vaccine every year. Ask your health care provider which type of influenza vaccine is best for you.  Go to the dentist on a regular basis.  Wash your hands often. Use hand sanitizer if soap and water are not available. SEEK MEDICAL CARE IF:  You have a fever.  You are losing sleep because you cannot control your cough with cough medicine. SEEK IMMEDIATE MEDICAL CARE IF:  You have worsening shortness of breath.  You have increased chest pain.  Your sickness becomes worse, especially if you are an older adult or have a weakened immune system.  You cough up blood.   This information is not intended to replace advice given to you by your health care provider. Make sure you discuss any questions you have with your health care provider.   Document Released: 11/07/2005 Document Revised: 07/29/2015 Document Reviewed: 03/04/2015 Elsevier Interactive Patient Education [employer].

## 2016-03-19 NOTE — ED Notes (Signed)
Patient ambulated on room air, HR 106-116, RR 20-28, SpO2 93-100%, NPC.  Denies DOE

## 2016-04-26 ENCOUNTER — Encounter: Payer: Self-pay | Admitting: Family Medicine

## 2016-05-03 ENCOUNTER — Ambulatory Visit (INDEPENDENT_AMBULATORY_CARE_PROVIDER_SITE_OTHER)
Admission: RE | Admit: 2016-05-03 | Discharge: 2016-05-03 | Disposition: A | Discharge status: Home / Self Care | Payer: Medicare Other | Source: Ambulatory Visit | Attending: Acute Care | Admitting: Acute Care

## 2016-05-03 DIAGNOSIS — Z87891 Personal history of nicotine dependence: Secondary | ICD-10-CM

## 2016-05-19 ENCOUNTER — Ambulatory Visit (INDEPENDENT_AMBULATORY_CARE_PROVIDER_SITE_OTHER): Payer: Medicare Other | Admitting: Family Medicine

## 2016-05-19 ENCOUNTER — Encounter: Payer: Self-pay | Admitting: Family Medicine

## 2016-05-19 ENCOUNTER — Ambulatory Visit (HOSPITAL_BASED_OUTPATIENT_CLINIC_OR_DEPARTMENT_OTHER)
Admission: RE | Admit: 2016-05-19 | Discharge: 2016-05-19 | Disposition: A | Discharge status: Home / Self Care | Payer: Medicare Other | Source: Ambulatory Visit | Attending: Family Medicine | Admitting: Family Medicine

## 2016-05-19 VITALS — BP 127/87 | HR 71 | Temp 97.9°F | Ht 62.75 in | Wt 119.4 lb

## 2016-05-19 DIAGNOSIS — Z0000 Encounter for general adult medical examination without abnormal findings: Secondary | ICD-10-CM | POA: Diagnosis not present

## 2016-05-19 DIAGNOSIS — J189 Pneumonia, unspecified organism: Secondary | ICD-10-CM

## 2016-05-19 DIAGNOSIS — K648 Other hemorrhoids: Secondary | ICD-10-CM | POA: Diagnosis not present

## 2016-05-19 DIAGNOSIS — E785 Hyperlipidemia, unspecified: Secondary | ICD-10-CM | POA: Diagnosis not present

## 2016-05-19 LAB — POCT URINALYSIS DIPSTICK
Bilirubin, UA: NEGATIVE
GLUCOSE UA: NEGATIVE
KETONES UA: NEGATIVE
Leukocytes, UA: NEGATIVE
Nitrite, UA: NEGATIVE
PROTEIN UA: NEGATIVE
RBC UA: NEGATIVE
SPEC GRAV UA: 1.02
Urobilinogen, UA: 0.2
pH, UA: 7

## 2016-05-19 MED ORDER — HYDROCORTISONE 2.5 % RE CREA: 1.0000 "application " | TOPICAL_CREAM | Freq: Two times a day (BID) | RECTAL | Status: DC

## 2016-05-19 NOTE — Patient Instructions (Signed)
Preventive Care for Adults, Female A healthy lifestyle and preventive care can promote health and wellness. Preventive health guidelines for women include the following key practices.  A routine yearly physical is a good way to check with your health care provider about your health and preventive screening. It is a chance to share any concerns and updates on your health and to receive a thorough exam.  Visit your dentist for a routine exam and preventive care every 6 months. Brush your teeth twice a day and floss once a day. Good oral hygiene prevents tooth decay and gum disease.  The frequency of eye exams is based on your age, health, family medical history, use of contact lenses, and other factors. Follow your health care provider's recommendations for frequency of eye exams.  Eat a healthy diet. Foods like vegetables, fruits, whole grains, low-fat dairy products, and lean protein foods contain the nutrients you need without too many calories. Decrease your intake of foods high in solid fats, added sugars, and salt. Eat the right amount of calories for you.Get information about a proper diet from your health care provider, if necessary.  Regular physical exercise is one of the most important things you can do for your health. Most adults should get at least 150 minutes of moderate-intensity exercise (any activity that increases your heart rate and causes you to sweat) each week. In addition, most adults need muscle-strengthening exercises on 2 or more days a week.  Maintain a healthy weight. The body mass index (BMI) is a screening tool to identify possible weight problems. It provides an estimate of body fat based on height and weight. Your health care provider can find your BMI and can help you achieve or maintain a healthy weight.For adults 20 years and older:  A BMI below 18.5 is considered underweight.  A BMI of 18.5 to 24.9 is normal.  A BMI of 25 to 29.9 is considered overweight.  A  BMI of 30 and above is considered obese.  Maintain normal blood lipids and cholesterol levels by exercising and minimizing your intake of saturated fat. Eat a balanced diet with plenty of fruit and vegetables. Blood tests for lipids and cholesterol should begin at age 43 and be repeated every 5 years. If your lipid or cholesterol levels are high, you are over 50, or you are at high risk for heart disease, you may need your cholesterol levels checked more frequently.Ongoing high lipid and cholesterol levels should be treated with medicines if diet and exercise are not working.  If you smoke, find out from your health care provider how to quit. If you do not use tobacco, do not start.  Lung cancer screening is recommended for adults aged 59-80 years who are at high risk for developing lung cancer because of a history of smoking. A yearly low-dose CT scan of the lungs is recommended for people who have at least a 30-pack-year history of smoking and are a current smoker or have quit within the past 15 years. A pack year of smoking is smoking an average of 1 pack of cigarettes a day for 1 year (for example: 1 pack a day for 30 years or 2 packs a day for 15 years). Yearly screening should continue until the smoker has stopped smoking for at least 15 years. Yearly screening should be stopped for people who develop a health problem that would prevent them from having lung cancer treatment.  If you are pregnant, do not drink alcohol. If you are  breastfeeding, be very cautious about drinking alcohol. If you are not pregnant and choose to drink alcohol, do not have more than 1 drink per day. One drink is considered to be 12 ounces (355 mL) of beer, 5 ounces (148 mL) of wine, or 1.5 ounces (44 mL) of liquor.  Avoid use of street drugs. Do not share needles with anyone. Ask for help if you need support or instructions about stopping the use of drugs.  High blood pressure causes heart disease and increases the risk  of stroke. Your blood pressure should be checked at least every 1 to 2 years. Ongoing high blood pressure should be treated with medicines if weight loss and exercise do not work.  If you are 55-79 years old, ask your health care provider if you should take aspirin to prevent strokes.  Diabetes screening is done by taking a blood sample to check your blood glucose level after you have not eaten for a certain period of time (fasting). If you are not overweight and you do not have risk factors for diabetes, you should be screened once every 3 years starting at age 45. If you are overweight or obese and you are 40-70 years of age, you should be screened for diabetes every year as part of your cardiovascular risk assessment.  Breast cancer screening is essential preventive care for women. You should practice "breast self-awareness." This means understanding the normal appearance and feel of your breasts and may include breast self-examination. Any changes detected, no matter how small, should be reported to a health care provider. Women in their 20s and 30s should have a clinical breast exam (CBE) by a health care provider as part of a regular health exam every 1 to 3 years. After age 40, women should have a CBE every year. Starting at age 40, women should consider having a mammogram (breast X-ray test) every year. Women who have a family history of breast cancer should talk to their health care provider about genetic screening. Women at a high risk of breast cancer should talk to their health care providers about having an MRI and a mammogram every year.  Breast cancer gene (BRCA)-related cancer risk assessment is recommended for women who have family members with BRCA-related cancers. BRCA-related cancers include breast, ovarian, tubal, and peritoneal cancers. Having family members with these cancers may be associated with an increased risk for harmful changes (mutations) in the breast cancer genes BRCA1 and  BRCA2. Results of the assessment will determine the need for genetic counseling and BRCA1 and BRCA2 testing.  Your health care provider may recommend that you be screened regularly for cancer of the pelvic organs (ovaries, uterus, and vagina). This screening involves a pelvic examination, including checking for microscopic changes to the surface of your cervix (Pap test). You may be encouraged to have this screening done every 3 years, beginning at age 21.  For women ages 30-65, health care providers may recommend pelvic exams and Pap testing every 3 years, or they may recommend the Pap and pelvic exam, combined with testing for human papilloma virus (HPV), every 5 years. Some types of HPV increase your risk of cervical cancer. Testing for HPV may also be done on women of any age with unclear Pap test results.  Other health care providers may not recommend any screening for nonpregnant women who are considered low risk for pelvic cancer and who do not have symptoms. Ask your health care provider if a screening pelvic exam is right for   you.  If you have had past treatment for cervical cancer or a condition that could lead to cancer, you need Pap tests and screening for cancer for at least 20 years after your treatment. If Pap tests have been discontinued, your risk factors (such as having a new sexual partner) need to be reassessed to determine if screening should resume. Some women have medical problems that increase the chance of getting cervical cancer. In these cases, your health care provider may recommend more frequent screening and Pap tests.  Colorectal cancer can be detected and often prevented. Most routine colorectal cancer screening begins at the age of 50 years and continues through age 75 years. However, your health care provider may recommend screening at an earlier age if you have risk factors for colon cancer. On a yearly basis, your health care provider may provide home test kits to check  for hidden blood in the stool. Use of a small camera at the end of a tube, to directly examine the colon (sigmoidoscopy or colonoscopy), can detect the earliest forms of colorectal cancer. Talk to your health care provider about this at age 50, when routine screening begins. Direct exam of the colon should be repeated every 5-10 years through age 75 years, unless early forms of precancerous polyps or small growths are found.  People who are at an increased risk for hepatitis B should be screened for this virus. You are considered at high risk for hepatitis B if:  You were born in a country where hepatitis B occurs often. Talk with your health care provider about which countries are considered high risk.  Your parents were born in a high-risk country and you have not received a shot to protect against hepatitis B (hepatitis B vaccine).  You have HIV or AIDS.  You use needles to inject street drugs.  You live with, or have sex with, someone who has hepatitis B.  You get hemodialysis treatment.  You take certain medicines for conditions like cancer, organ transplantation, and autoimmune conditions.  Hepatitis C blood testing is recommended for all people born from 1945 through 1965 and any individual with known risks for hepatitis C.  Practice safe sex. Use condoms and avoid high-risk sexual practices to reduce the spread of sexually transmitted infections (STIs). STIs include gonorrhea, chlamydia, syphilis, trichomonas, herpes, HPV, and human immunodeficiency virus (HIV). Herpes, HIV, and HPV are viral illnesses that have no cure. They can result in disability, cancer, and death.  You should be screened for sexually transmitted illnesses (STIs) including gonorrhea and chlamydia if:  You are sexually active and are younger than 24 years.  You are older than 24 years and your health care provider tells you that you are at risk for this type of infection.  Your sexual activity has changed  since you were last screened and you are at an increased risk for chlamydia or gonorrhea. Ask your health care provider if you are at risk.  If you are at risk of being infected with HIV, it is recommended that you take a prescription medicine daily to prevent HIV infection. This is called preexposure prophylaxis (PrEP). You are considered at risk if:  You are sexually active and do not regularly use condoms or know the HIV status of your partner(s).  You take drugs by injection.  You are sexually active with a partner who has HIV.  Talk with your health care provider about whether you are at high risk of being infected with HIV. If   you choose to begin PrEP, you should first be tested for HIV. You should then be tested every 3 months for as long as you are taking PrEP.  Osteoporosis is a disease in which the bones lose minerals and strength with aging. This can result in serious bone fractures or breaks. The risk of osteoporosis can be identified using a bone density scan. Women ages 41 years and over and women at risk for fractures or osteoporosis should discuss screening with their health care providers. Ask your health care provider whether you should take a calcium supplement or vitamin D to reduce the rate of osteoporosis.  Menopause can be associated with physical symptoms and risks. Hormone replacement therapy is available to decrease symptoms and risks. You should talk to your health care provider about whether hormone replacement therapy is right for you.  Use sunscreen. Apply sunscreen liberally and repeatedly throughout the day. You should seek shade when your shadow is shorter than you. Protect yourself by wearing long sleeves, pants, a wide-brimmed hat, and sunglasses year round, whenever you are outdoors.  Once a month, do a whole body skin exam, using a mirror to look at the skin on your back. Tell your health care provider of new moles, moles that have irregular borders, moles that  are larger than a pencil eraser, or moles that have changed in shape or color.  Stay current with required vaccines (immunizations).  Influenza vaccine. All adults should be immunized every year.  Tetanus, diphtheria, and acellular pertussis (Td, Tdap) vaccine. Pregnant women should receive 1 dose of Tdap vaccine during each pregnancy. The dose should be obtained regardless of the length of time since the last dose. Immunization is preferred during the 27th-36th week of gestation. An adult who has not previously received Tdap or who does not know her vaccine status should receive 1 dose of Tdap. This initial dose should be followed by tetanus and diphtheria toxoids (Td) booster doses every 10 years. Adults with an unknown or incomplete history of completing a 3-dose immunization series with Td-containing vaccines should begin or complete a primary immunization series including a Tdap dose. Adults should receive a Td booster every 10 years.  Varicella vaccine. An adult without evidence of immunity to varicella should receive 2 doses or a second dose if she has previously received 1 dose. Pregnant females who do not have evidence of immunity should receive the first dose after pregnancy. This first dose should be obtained before leaving the health care facility. The second dose should be obtained 4-8 weeks after the first dose.  Human papillomavirus (HPV) vaccine. Females aged 13-26 years who have not received the vaccine previously should obtain the 3-dose series. The vaccine is not recommended for use in pregnant females. However, pregnancy testing is not needed before receiving a dose. If a female is found to be pregnant after receiving a dose, no treatment is needed. In that case, the remaining doses should be delayed until after the pregnancy. Immunization is recommended for any person with an immunocompromised condition through the age of 46 years if she did not get any or all doses earlier. During the  3-dose series, the second dose should be obtained 4-8 weeks after the first dose. The third dose should be obtained 24 weeks after the first dose and 16 weeks after the second dose.  Zoster vaccine. One dose is recommended for adults aged 32 years or older unless certain conditions are present.  Measles, mumps, and rubella (MMR) vaccine. Adults born  before 1957 generally are considered immune to measles and mumps. Adults born in 1957 or later should have 1 or more doses of MMR vaccine unless there is a contraindication to the vaccine or there is laboratory evidence of immunity to each of the three diseases. A routine second dose of MMR vaccine should be obtained at least 28 days after the first dose for students attending postsecondary schools, health care workers, or international travelers. People who received inactivated measles vaccine or an unknown type of measles vaccine during 1963-1967 should receive 2 doses of MMR vaccine. People who received inactivated mumps vaccine or an unknown type of mumps vaccine before 1979 and are at high risk for mumps infection should consider immunization with 2 doses of MMR vaccine. For females of childbearing age, rubella immunity should be determined. If there is no evidence of immunity, females who are not pregnant should be vaccinated. If there is no evidence of immunity, females who are pregnant should delay immunization until after pregnancy. Unvaccinated health care workers born before 1957 who lack laboratory evidence of measles, mumps, or rubella immunity or laboratory confirmation of disease should consider measles and mumps immunization with 2 doses of MMR vaccine or rubella immunization with 1 dose of MMR vaccine.  Pneumococcal 13-valent conjugate (PCV13) vaccine. When indicated, a person who is uncertain of his immunization history and has no record of immunization should receive the PCV13 vaccine. All adults 65 years of age and older should receive this  vaccine. An adult aged 19 years or older who has certain medical conditions and has not been previously immunized should receive 1 dose of PCV13 vaccine. This PCV13 should be followed with a dose of pneumococcal polysaccharide (PPSV23) vaccine. Adults who are at high risk for pneumococcal disease should obtain the PPSV23 vaccine at least 8 weeks after the dose of PCV13 vaccine. Adults older than 69 years of age who have normal immune system function should obtain the PPSV23 vaccine dose at least 1 year after the dose of PCV13 vaccine.  Pneumococcal polysaccharide (PPSV23) vaccine. When PCV13 is also indicated, PCV13 should be obtained first. All adults aged 65 years and older should be immunized. An adult younger than age 65 years who has certain medical conditions should be immunized. Any person who resides in a nursing home or long-term care facility should be immunized. An adult smoker should be immunized. People with an immunocompromised condition and certain other conditions should receive both PCV13 and PPSV23 vaccines. People with human immunodeficiency virus (HIV) infection should be immunized as soon as possible after diagnosis. Immunization during chemotherapy or radiation therapy should be avoided. Routine use of PPSV23 vaccine is not recommended for American Indians, [place] Natives, or people younger than 65 years unless there are medical conditions that require PPSV23 vaccine. When indicated, people who have unknown immunization and have no record of immunization should receive PPSV23 vaccine. One-time revaccination 5 years after the first dose of PPSV23 is recommended for people aged 19-64 years who have chronic kidney failure, nephrotic syndrome, asplenia, or immunocompromised conditions. People who received 1-2 doses of PPSV23 before age 65 years should receive another dose of PPSV23 vaccine at age 65 years or later if at least 5 years have passed since the previous dose. Doses of PPSV23 are not  needed for people immunized with PPSV23 at or after age 65 years.  Meningococcal vaccine. Adults with asplenia or persistent complement component deficiencies should receive 2 doses of quadrivalent meningococcal conjugate (MenACWY-D) vaccine. The doses should be obtained   at least 2 months apart. Microbiologists working with certain meningococcal bacteria, [place] recruits, people at risk during an outbreak, and people who travel to or live in countries with a high rate of meningitis should be immunized. A first-year college student up through age 34 years who is living in a residence hall should receive a dose if she did not receive a dose on or after her 16th birthday. Adults who have certain high-risk conditions should receive one or more doses of vaccine.  Hepatitis A vaccine. Adults who wish to be protected from this disease, have certain high-risk conditions, work with hepatitis A-infected animals, work in hepatitis A research labs, or travel to or work in countries with a high rate of hepatitis A should be immunized. Adults who were previously unvaccinated and who anticipate close contact with an international adoptee during the first 60 days after arrival in the [place] from a country with a high rate of hepatitis A should be immunized.  Hepatitis B vaccine. Adults who wish to be protected from this disease, have certain high-risk conditions, may be exposed to blood or other infectious body fluids, are household contacts or sex partners of hepatitis B positive people, are clients or workers in certain care facilities, or travel to or work in countries with a high rate of hepatitis B should be immunized.  Haemophilus influenzae type b (Hib) vaccine. A previously unvaccinated person with asplenia or sickle cell disease or having a scheduled splenectomy should receive 1 dose of Hib vaccine. Regardless of previous immunization, a recipient of a hematopoietic stem cell transplant should receive a  3-dose series 6-12 months after her successful transplant. Hib vaccine is not recommended for adults with HIV infection. Preventive Services / Frequency Ages 35 to 4 years  Blood pressure check.** / Every 3-5 years.  Lipid and cholesterol check.** / Every 5 years beginning at age 60.  Clinical breast exam.** / Every 3 years for women in their 71s and 10s.  BRCA-related cancer risk assessment.** / For women who have family members with a BRCA-related cancer (breast, ovarian, tubal, or peritoneal cancers).  Pap test.** / Every 2 years from ages 76 through 26. Every 3 years starting at age 61 through age 76 or 93 with a history of 3 consecutive normal Pap tests.  HPV screening.** / Every 3 years from ages 37 through ages 60 to 51 with a history of 3 consecutive normal Pap tests.  Hepatitis C blood test.** / For any individual with known risks for hepatitis C.  Skin self-exam. / Monthly.  Influenza vaccine. / Every year.  Tetanus, diphtheria, and acellular pertussis (Tdap, Td) vaccine.** / Consult your health care provider. Pregnant women should receive 1 dose of Tdap vaccine during each pregnancy. 1 dose of Td every 10 years.  Varicella vaccine.** / Consult your health care provider. Pregnant females who do not have evidence of immunity should receive the first dose after pregnancy.  HPV vaccine. / 3 doses over 6 months, if 93 and younger. The vaccine is not recommended for use in pregnant females. However, pregnancy testing is not needed before receiving a dose.  Measles, mumps, rubella (MMR) vaccine.** / You need at least 1 dose of MMR if you were born in 1957 or later. You may also need a 2nd dose. For females of childbearing age, rubella immunity should be determined. If there is no evidence of immunity, females who are not pregnant should be vaccinated. If there is no evidence of immunity, females who are  pregnant should delay immunization until after pregnancy.  Pneumococcal  13-valent conjugate (PCV13) vaccine.** / Consult your health care provider.  Pneumococcal polysaccharide (PPSV23) vaccine.** / 1 to 2 doses if you smoke cigarettes or if you have certain conditions.  Meningococcal vaccine.** / 1 dose if you are age 68 to 8 years and a Market researcher living in a residence hall, or have one of several medical conditions, you need to get vaccinated against meningococcal disease. You may also need additional booster doses.  Hepatitis A vaccine.** / Consult your health care provider.  Hepatitis B vaccine.** / Consult your health care provider.  Haemophilus influenzae type b (Hib) vaccine.** / Consult your health care provider. Ages 7 to 53 years  Blood pressure check.** / Every year.  Lipid and cholesterol check.** / Every 5 years beginning at age 25 years.  Lung cancer screening. / Every year if you are aged 11-80 years and have a 30-pack-year history of smoking and currently smoke or have quit within the past 15 years. Yearly screening is stopped once you have quit smoking for at least 15 years or develop a health problem that would prevent you from having lung cancer treatment.  Clinical breast exam.** / Every year after age 48 years.  BRCA-related cancer risk assessment.** / For women who have family members with a BRCA-related cancer (breast, ovarian, tubal, or peritoneal cancers).  Mammogram.** / Every year beginning at age 41 years and continuing for as long as you are in good health. Consult with your health care provider.  Pap test.** / Every 3 years starting at age 65 years through age 37 or 70 years with a history of 3 consecutive normal Pap tests.  HPV screening.** / Every 3 years from ages 72 years through ages 60 to 40 years with a history of 3 consecutive normal Pap tests.  Fecal occult blood test (FOBT) of stool. / Every year beginning at age 21 years and continuing until age 5 years. You may not need to do this test if you get  a colonoscopy every 10 years.  Flexible sigmoidoscopy or colonoscopy.** / Every 5 years for a flexible sigmoidoscopy or every 10 years for a colonoscopy beginning at age 35 years and continuing until age 48 years.  Hepatitis C blood test.** / For all people born from [birth year] through 1965 and any individual with known risks for hepatitis C.  Skin self-exam. / Monthly.  Influenza vaccine. / Every year.  Tetanus, diphtheria, and acellular pertussis (Tdap/Td) vaccine.** / Consult your health care provider. Pregnant women should receive 1 dose of Tdap vaccine during each pregnancy. 1 dose of Td every 10 years.  Varicella vaccine.** / Consult your health care provider. Pregnant females who do not have evidence of immunity should receive the first dose after pregnancy.  Zoster vaccine.** / 1 dose for adults aged 30 years or older.  Measles, mumps, rubella (MMR) vaccine.** / You need at least 1 dose of MMR if you were born in 1957 or later. You may also need a second dose. For females of childbearing age, rubella immunity should be determined. If there is no evidence of immunity, females who are not pregnant should be vaccinated. If there is no evidence of immunity, females who are pregnant should delay immunization until after pregnancy.  Pneumococcal 13-valent conjugate (PCV13) vaccine.** / Consult your health care provider.  Pneumococcal polysaccharide (PPSV23) vaccine.** / 1 to 2 doses if you smoke cigarettes or if you have certain conditions.  Meningococcal vaccine.** /  Consult your health care provider.  Hepatitis A vaccine.** / Consult your health care provider.  Hepatitis B vaccine.** / Consult your health care provider.  Haemophilus influenzae type b (Hib) vaccine.** / Consult your health care provider. Ages 64 years and over  Blood pressure check.** / Every year.  Lipid and cholesterol check.** / Every 5 years beginning at age 23 years.  Lung cancer screening. / Every year if you  are aged 16-80 years and have a 30-pack-year history of smoking and currently smoke or have quit within the past 15 years. Yearly screening is stopped once you have quit smoking for at least 15 years or develop a health problem that would prevent you from having lung cancer treatment.  Clinical breast exam.** / Every year after age 74 years.  BRCA-related cancer risk assessment.** / For women who have family members with a BRCA-related cancer (breast, ovarian, tubal, or peritoneal cancers).  Mammogram.** / Every year beginning at age 44 years and continuing for as long as you are in good health. Consult with your health care provider.  Pap test.** / Every 3 years starting at age 58 years through age 22 or 39 years with 3 consecutive normal Pap tests. Testing can be stopped between 65 and 70 years with 3 consecutive normal Pap tests and no abnormal Pap or HPV tests in the past 10 years.  HPV screening.** / Every 3 years from ages 64 years through ages 70 or 61 years with a history of 3 consecutive normal Pap tests. Testing can be stopped between 65 and 70 years with 3 consecutive normal Pap tests and no abnormal Pap or HPV tests in the past 10 years.  Fecal occult blood test (FOBT) of stool. / Every year beginning at age 40 years and continuing until age 27 years. You may not need to do this test if you get a colonoscopy every 10 years.  Flexible sigmoidoscopy or colonoscopy.** / Every 5 years for a flexible sigmoidoscopy or every 10 years for a colonoscopy beginning at age 7 years and continuing until age 32 years.  Hepatitis C blood test.** / For all people born from [birth year] through 1965 and any individual with known risks for hepatitis C.  Osteoporosis screening.** / A one-time screening for women ages 30 years and over and women at risk for fractures or osteoporosis.  Skin self-exam. / Monthly.  Influenza vaccine. / Every year.  Tetanus, diphtheria, and acellular pertussis (Tdap/Td)  vaccine.** / 1 dose of Td every 10 years.  Varicella vaccine.** / Consult your health care provider.  Zoster vaccine.** / 1 dose for adults aged 35 years or older.  Pneumococcal 13-valent conjugate (PCV13) vaccine.** / Consult your health care provider.  Pneumococcal polysaccharide (PPSV23) vaccine.** / 1 dose for all adults aged 46 years and older.  Meningococcal vaccine.** / Consult your health care provider.  Hepatitis A vaccine.** / Consult your health care provider.  Hepatitis B vaccine.** / Consult your health care provider.  Haemophilus influenzae type b (Hib) vaccine.** / Consult your health care provider. ** Family history and personal history of risk and conditions may change your health care provider's recommendations.   This information is not intended to replace advice given to you by your health care provider. Make sure you discuss any questions you have with your health care provider.   Document Released: 01/03/2002 Document Revised: 11/28/2014 Document Reviewed: 04/04/2011 Elsevier Interactive Patient Education [employer].

## 2016-05-19 NOTE — Progress Notes (Signed)
Pre visit review using our clinic review tool, if applicable. No additional management support is needed unless otherwise documented below in the visit note. 

## 2016-05-19 NOTE — Progress Notes (Signed)
Subjective:   Samantha Miranda is a 70 y.o. female who presents for Medicare Annual (Subsequent) preventive examination.  Review of Systems:   Review of Systems  Constitutional: Negative for activity change, appetite change and fatigue.  HENT: Negative for hearing loss, congestion, tinnitus and ear discharge.   Eyes: Negative for visual disturbance (see optho q1y -- vision corrected to 20/20 with glasses).  Respiratory: Negative for cough, chest tightness and shortness of breath.   Cardiovascular: Negative for chest pain, palpitations and leg swelling.  Gastrointestinal: Negative for abdominal pain, diarrhea, constipation and abdominal distention.  Genitourinary: Negative for urgency, frequency, decreased urine volume and difficulty urinating.  Musculoskeletal: Negative for back pain, arthralgias and gait problem.  Skin: Negative for color change, pallor and rash.  Neurological: Negative for dizziness, light-headedness, numbness and headaches.  Hematological: Negative for adenopathy. Does not bruise/bleed easily.  Psychiatric/Behavioral: Negative for suicidal ideas, confusion, sleep disturbance, self-injury, dysphoric mood, decreased concentration and agitation.  Pt is able to read and write and can do all ADLs No risk for falling No abuse/ violence in home           Objective:     Vitals: BP 127/87 mmHg  Pulse 71  Temp(Src) 97.9 F (36.6 C) (Oral)  Ht 5' 2.75" (1.594 m)  Wt 119 lb 6.4 oz (54.159 kg)  BMI 21.32 kg/m2  SpO2 97%  Body mass index is 21.32 kg/(m^2). BP 127/87 mmHg  Pulse 71  Temp(Src) 97.9 F (36.6 C) (Oral)  Ht 5' 2.75" (1.594 m)  Wt 119 lb 6.4 oz (54.159 kg)  BMI 21.32 kg/m2  SpO2 97% General appearance: alert, cooperative, appears stated age and no distress Head: Normocephalic, without obvious abnormality, atraumatic Eyes: conjunctivae/corneas clear. PERRL, EOM's intact. Fundi benign. Ears: normal TM's and external ear canals both ears Nose: Nares  normal. Septum midline. Mucosa normal. No drainage or sinus tenderness. Throat: lips, mucosa, and tongue normal; teeth and gums normal Neck: no adenopathy, no carotid bruit, no JVD, supple, symmetrical, trachea midline and thyroid not enlarged, symmetric, no tenderness/mass/nodules Back: symmetric, no curvature. ROM normal. No CVA tenderness. Lungs: clear to auscultation bilaterally Breasts: normal appearance, no masses or tenderness Heart: regular rate and rhythm, S1, S2 normal, no murmur, click, rub or gallop Abdomen: soft, non-tender; bowel sounds normal; no masses,  no organomegaly Pelvic: not indicated; status post hysterectomy, negative ROS Extremities: extremities normal, atraumatic, no cyanosis or edema Pulses: 2+ and symmetric Skin: Skin color, texture, turgor normal. No rashes or lesions Lymph nodes: Cervical, supraclavicular, and axillary nodes normal. Neurologic: Alert and oriented X 3, normal strength and tone. Normal symmetric reflexes. Normal coordination and gait Psych- no depression, no anxiety  Tobacco History  Smoking status  . Former Smoker -- 1.00 packs/day for 40 years  . Quit date: 05/03/2015  Smokeless tobacco  . Former Systems developer  . Quit date: 08/26/2012    Comment: Pt currently using lowest strength vapes.  11/02/2015     Counseling given: Not Answered   Past Medical History  Diagnosis Date  . Hyperlipidemia   . Osteopenia   . Cancer [facility])     Breast cancer  . Basal cell carcinoma of left lower leg    Past Surgical History  Procedure Laterality Date  . Abdominal hysterectomy  1991  . Breast lumpectomy  9/07  . Cataract extraction, bilateral    . Eye surgery  08/2013  10/2013    cataracts   Family History  Problem Relation Age of Onset  .  Colon cancer Maternal Grandfather   . Breast cancer      Sister  . Lung cancer    . Diabetes Maternal Grandmother   . Heart failure Mother     CHF  . Hyperlipidemia Mother   . Heart attack Neg Hx   .  Hypertension Neg Hx   . Sudden death Neg Hx   . Lung cancer Father 28    lung  . Cancer Sister 24    breast   History  Sexual Activity  . Sexual Activity: Not Currently    Outpatient Encounter Prescriptions as of 05/19/2016  Medication Sig  . Biotin 1000 MCG tablet Take 1,000 mcg by mouth daily.  . Cholecalciferol (VITAMIN D) 2000 UNITS tablet Take 4,000 Units by mouth daily.  . Coenzyme Q10 (COQ10 PO) Take 1 each by mouth daily.  . Collagenase POWD by Does not apply route.  Marland Kitchen FLUoxetine (PROZAC) 10 MG tablet Take 1 tablet (10 mg total) by mouth daily.  . hydrocortisone-pramoxine (ANALPRAM-HC) 2.5-1 % rectal cream apply to affected area three times a day  . MAGNESIUM PO Take 1 each by mouth daily.  . Misc Natural Products (CHOLESTEROL SUPPORT PO) Take by mouth.  . Multiple Vitamins-Minerals (MULTIVITAMIN ADULTS PO) Take 1 tablet by mouth daily.  . Probiotic Product (PROBIOTIC DAILY PO) Take 1 tablet by mouth daily.  . [DISCONTINUED] albuterol (PROVENTIL HFA;VENTOLIN HFA) 108 (90 BASE) MCG/ACT inhaler Inhale 2 puffs into the lungs every 6 (six) hours as needed for wheezing or shortness of breath.  . [DISCONTINUED] azithromycin (ZITHROMAX Z-PAK) 250 MG tablet 2 po day one, then 1 daily x 4 days  . [DISCONTINUED] cephALEXin (KEFLEX) 500 MG capsule Take 1 capsule (500 mg total) by mouth 3 (three) times daily.  . [DISCONTINUED] guaiFENesin-codeine 100-10 MG/5ML syrup Take 5-10 mLs by mouth every 6 (six) hours as needed for cough.   No facility-administered encounter medications on file as of 05/19/2016.    Activities of Daily Living In your present state of health, do you have any difficulty performing the following activities: 05/19/2016 11/02/2015  Hearing? [name]  Vision? Y N  Difficulty concentrating or making decisions? N N  Walking or climbing stairs? N N  Dressing or bathing? N N  Doing errands, shopping? N N  Preparing Food and eating ? - N  Using the Toilet? - N  In the past  six months, have you accidently leaked urine? - N  Do you have problems with loss of bowel control? - N  Managing your Medications? - N  Managing your Finances? - N  Housekeeping or managing your Housekeeping? - N    Patient Care Team: Ann Held, DO as PCP - General Calton Dach, MD as Referring Physician (Optometry) Izora Ribas (Dermatology)    Assessment:    CPE Exercise Activities and Dietary recommendations Current Exercise Habits: The patient does not participate in regular exercise at present, Exercise limited by: None identified  Goals    . Receive flu shot today.     . Remain active and independent.       Continue with current routine.        Fall Risk Fall Risk  05/19/2016 11/02/2015 04/21/2015 03/03/2014 02/06/2013  Falls in the past year? No No No No No   Depression Screen PHQ 2/9 Scores 05/19/2016 11/02/2015 04/21/2015 03/03/2014  PHQ - 2 Score 0 0 0 0  PHQ- 9 Score - - - -  Exception Documentation - - Patient refusal -  Cognitive Testing mmse30/30  Immunization History  Administered Date(s) Administered  . Influenza Split 09/22/2011  . Influenza, High Dose Seasonal PF 10/10/2014, 11/02/2015  . Pneumococcal Conjugate-13 04/21/2015  . Pneumococcal Polysaccharide-23 03/03/2014  . Td 08/26/2010  . Zoster 04/02/2009   Screening Tests Health Maintenance  Topic Date Due  . INFLUENZA VACCINE  06/21/2016  . MAMMOGRAM  03/02/2018  . TETANUS/TDAP  08/26/2020  . COLONOSCOPY  10/23/2021  . DEXA SCAN  Addressed  . ZOSTAVAX  Completed  . Hepatitis C Screening  Completed  . PNA vac Low Risk Adult  Completed      Plan:    CPE  During the course of the visit the patient was educated and counseled about the following appropriate screening and preventive services:   Vaccines to include Pneumoccal, Influenza, Hepatitis B, Td, Zostavax, HCV  Electrocardiogram  Cardiovascular Disease  Colorectal cancer screening  Bone density  screening  Diabetes screening  Glaucoma screening  Mammography/PAP  Nutrition counseling   Patient Instructions (the written plan) was given to the patient.  1. CAP (community acquired pneumonia) Repeat cxr - DG Chest 2 View; Future  2. Preventative health care See avs Check labs ghm utd Pt refuses bmd - Comprehensive metabolic panel - POCT urinalysis dipstick - Lipid panel - CBC with Differential/Platelet  3. Hyperlipidemia Check labs - Comprehensive metabolic panel - POCT urinalysis dipstick - Lipid panel - CBC with Differential/Platelet  Ann Held, DO  05/19/2016

## 2016-05-20 LAB — CBC WITH DIFFERENTIAL/PLATELET
BASOS PCT: 1.1 % (ref 0.0–3.0)
Basophils Absolute: 0.1 10*3/uL (ref 0.0–0.1)
EOS PCT: 6 % — AB (ref 0.0–5.0)
Eosinophils Absolute: 0.3 10*3/uL (ref 0.0–0.7)
HCT: 39.3 % (ref 36.0–46.0)
HEMOGLOBIN: 13.4 g/dL (ref 12.0–15.0)
LYMPHS ABS: 2.1 10*3/uL (ref 0.7–4.0)
Lymphocytes Relative: 35.4 % (ref 12.0–46.0)
MCHC: 34 g/dL (ref 30.0–36.0)
MCV: 89.3 fl (ref 78.0–100.0)
MONO ABS: 0.4 10*3/uL (ref 0.1–1.0)
Monocytes Relative: 6.1 % (ref 3.0–12.0)
Neutro Abs: 3 10*3/uL (ref 1.4–7.7)
Neutrophils Relative %: 51.4 % (ref 43.0–77.0)
Platelets: 187 10*3/uL (ref 150.0–400.0)
RBC: 4.4 Mil/uL (ref 3.87–5.11)
RDW: 13.8 % (ref 11.5–15.5)
WBC: 5.8 10*3/uL (ref 4.0–10.5)

## 2016-05-20 LAB — LIPID PANEL
CHOLESTEROL: 271 mg/dL — AB (ref 0–200)
HDL: 70.4 mg/dL (ref >39.00)
LDL Cholesterol: 188 mg/dL — ABNORMAL HIGH (ref 0–99)
NonHDL: 200.72
Total CHOL/HDL Ratio: 4
Triglycerides: 66 mg/dL (ref 0.0–149.0)
VLDL: 13.2 mg/dL (ref 0.0–40.0)

## 2016-05-20 LAB — COMPREHENSIVE METABOLIC PANEL
ALBUMIN: 4.3 g/dL (ref 3.5–5.2)
ALK PHOS: 50 U/L (ref 39–117)
ALT: 23 U/L (ref 0–35)
AST: 32 U/L (ref 0–37)
BUN: 18 mg/dL (ref 6–23)
CO2: 30 mEq/L (ref 19–32)
Calcium: 9.9 mg/dL (ref 8.4–10.5)
Chloride: 103 mEq/L (ref 96–112)
Creatinine, Ser: 0.88 mg/dL (ref 0.40–1.20)
GFR: 67.68 mL/min (ref >60.00)
Glucose, Bld: 71 mg/dL (ref 70–99)
POTASSIUM: 4.3 meq/L (ref 3.5–5.1)
Sodium: 141 mEq/L (ref 135–145)
TOTAL PROTEIN: 7.5 g/dL (ref 6.0–8.3)
Total Bilirubin: 0.5 mg/dL (ref 0.2–1.2)

## 2016-08-09 ENCOUNTER — Encounter: Payer: Self-pay | Admitting: Family Medicine

## 2016-09-05 ENCOUNTER — Other Ambulatory Visit: Payer: Self-pay | Admitting: Family Medicine

## 2016-11-02 ENCOUNTER — Other Ambulatory Visit: Payer: Self-pay | Admitting: Acute Care

## 2016-11-02 DIAGNOSIS — Z87891 Personal history of nicotine dependence: Secondary | ICD-10-CM

## 2017-01-16 ENCOUNTER — Ambulatory Visit: Payer: Self-pay | Admitting: Family

## 2017-01-17 ENCOUNTER — Encounter: Payer: Self-pay | Admitting: Family

## 2017-01-17 ENCOUNTER — Ambulatory Visit (INDEPENDENT_AMBULATORY_CARE_PROVIDER_SITE_OTHER): Payer: Medicare Other | Admitting: Family

## 2017-01-17 VITALS — BP 129/79 | HR 78 | Temp 97.5°F | Resp 16 | Ht 63.0 in | Wt 122.0 lb

## 2017-01-17 DIAGNOSIS — R5383 Other fatigue: Secondary | ICD-10-CM

## 2017-01-17 LAB — HEPATIC FUNCTION PANEL
ALK PHOS: 48 U/L (ref 39–117)
ALT: 20 U/L (ref 0–35)
AST: 21 U/L (ref 0–37)
Albumin: 4.4 g/dL (ref 3.5–5.2)
BILIRUBIN DIRECT: 0.1 mg/dL (ref 0.0–0.3)
BILIRUBIN TOTAL: 0.4 mg/dL (ref 0.2–1.2)
Total Protein: 7.4 g/dL (ref 6.0–8.3)

## 2017-01-17 LAB — CBC WITH DIFFERENTIAL/PLATELET
BASOS PCT: 1.3 % (ref 0.0–3.0)
Basophils Absolute: 0.1 10*3/uL (ref 0.0–0.1)
EOS PCT: 4.6 % (ref 0.0–5.0)
Eosinophils Absolute: 0.3 10*3/uL (ref 0.0–0.7)
HCT: 40.1 % (ref 36.0–46.0)
HEMOGLOBIN: 13.7 g/dL (ref 12.0–15.0)
LYMPHS ABS: 1.9 10*3/uL (ref 0.7–4.0)
Lymphocytes Relative: 31.7 % (ref 12.0–46.0)
MCHC: 34.2 g/dL (ref 30.0–36.0)
MCV: 90.4 fl (ref 78.0–100.0)
MONO ABS: 0.4 10*3/uL (ref 0.1–1.0)
Monocytes Relative: 6.7 % (ref 3.0–12.0)
NEUTROS PCT: 55.7 % (ref 43.0–77.0)
Neutro Abs: 3.3 10*3/uL (ref 1.4–7.7)
Platelets: 201 10*3/uL (ref 150.0–400.0)
RBC: 4.44 Mil/uL (ref 3.87–5.11)
RDW: 13.5 % (ref 11.5–15.5)
WBC: 5.9 10*3/uL (ref 4.0–10.5)

## 2017-01-17 LAB — BASIC METABOLIC PANEL
BUN: 14 mg/dL (ref 6–23)
CO2: 30 mEq/L (ref 19–32)
Calcium: 9.7 mg/dL (ref 8.4–10.5)
Chloride: 102 mEq/L (ref 96–112)
Creatinine, Ser: 0.78 mg/dL (ref 0.40–1.20)
GFR: 77.63 mL/min (ref >60.00)
Glucose, Bld: 95 mg/dL (ref 70–99)
POTASSIUM: 3.7 meq/L (ref 3.5–5.1)
SODIUM: 139 meq/L (ref 135–145)

## 2017-01-17 LAB — TSH: TSH: 0.92 u[IU]/mL (ref 0.35–4.50)

## 2017-01-17 NOTE — Progress Notes (Signed)
Pre visit review using our clinic review tool, if applicable. No additional management support is needed unless otherwise documented below in the visit note. 

## 2017-01-17 NOTE — Patient Instructions (Signed)
Please increase your fluid intake to a total of 6-8 glasses of water a day.  Complete lab work prior to leaving.

## 2017-01-17 NOTE — Progress Notes (Signed)
Subjective:    Patient ID: Samantha Miranda, female    DOB: Feb 03, 1946, 71 y.o.   MRN: [id number]  HPI  Samantha Miranda is a 71 yr old female who presents today with chief complaint of fatigue.    Notes 3 episodes of sudden weakness with nausea/dizziness and fatigue.  Reports that she feels "tired all the time these days."  97/65,  85/65, 102/67, 97/57.  She reports that she drinks 1.5 pepsis a day and 2 cups of coffee in the AM.    Review of Systems See HPI  Past Medical History:  Diagnosis Date  . Basal cell carcinoma of left lower leg   . Cancer [facility])    Breast cancer  . Hyperlipidemia   . Osteopenia      Social History   Social History  . Marital status: Married    Spouse name: N/A  . Number of children: N/A  . Years of education: N/A   Occupational History  .  [employer]    retired   Social History Main Topics  . Smoking status: Former Smoker    Packs/day: 1.00    Years: 40.00    Quit date: 05/03/2015  . Smokeless tobacco: Former Systems developer    Quit date: 08/26/2012     Comment: Pt currently using lowest strength vapes.  11/02/2015  . Alcohol use No  . Drug use: No  . Sexual activity: Not Currently   Other Topics Concern  . Not on file   Social History Narrative  . No narrative on file    Past Surgical History:  Procedure Laterality Date  . ABDOMINAL HYSTERECTOMY  1991  . BREAST LUMPECTOMY  9/07  . CATARACT EXTRACTION, BILATERAL    . EYE SURGERY  08/2013  10/2013   cataracts    Family History  Problem Relation Age of Onset  . Colon cancer Maternal Grandfather   . Breast cancer      Sister  . Lung cancer    . Diabetes Maternal Grandmother   . Heart failure Mother     CHF  . Hyperlipidemia Mother   . Heart attack Neg Hx   . Hypertension Neg Hx   . Sudden death Neg Hx   . Lung cancer Father 28    lung  . Cancer Sister 58    breast    Allergies  Allergen Reactions  . Amoxicillin-Pot Clavulanate Nausea And Vomiting  . Moxifloxacin  Nausea And Vomiting    Current Outpatient Prescriptions on File Prior to Visit  Medication Sig Dispense Refill  . Cholecalciferol (VITAMIN D) 2000 UNITS tablet Take 4,000 Units by mouth daily.    . Coenzyme Q10 (COQ10 PO) Take 1 each by mouth daily.    . Collagenase POWD by Does not apply route.    Marland Kitchen FLUoxetine (PROZAC) 10 MG tablet take 1 tablet by mouth once daily 30 tablet 5  . hydrocortisone (ANUSOL-HC) 2.5 % rectal cream Place 1 application rectally 2 (two) times daily. 30 g 0  . MAGNESIUM PO Take 1 each by mouth daily.    . Misc Natural Products (CHOLESTEROL SUPPORT PO) Take by mouth.    . Multiple Vitamins-Minerals (MULTIVITAMIN ADULTS PO) Take 1 tablet by mouth daily.    . Probiotic Product (PROBIOTIC DAILY PO) Take 1 tablet by mouth daily.     No current facility-administered medications on file prior to visit.     BP 129/79 (BP Location: Right Arm, Patient Position: Sitting, Cuff Size: Normal)  Pulse 78   Temp 97.5 F (36.4 C) (Oral)   Resp 16   Ht 5\' 3"  (1.6 m)   Wt 122 lb (55.3 kg)   SpO2 100%   BMI 21.61 kg/m     Objective:   Physical Exam  Constitutional: She is oriented to person, place, and time. She appears well-developed and well-nourished.  Neck: No thyromegaly present.  Cardiovascular: Normal rate, regular rhythm and normal heart sounds.   No murmur heard. Pulmonary/Chest: Effort normal and breath sounds normal. No respiratory distress. She has no wheezes.  Musculoskeletal: She exhibits no edema.  Lymphadenopathy:    She has no cervical adenopathy.  Neurological: She is alert and oriented to person, place, and time.  Skin: Skin is warm and dry.  Psychiatric: She has a normal mood and affect. Her behavior is normal. Judgment and thought content normal.          Assessment & Plan:  Fatigue- new. has also had some weight gain. BP is low at times at home.  She only drinks 1.5 pepsi's a day and 2 cups of coffee. Discussed increasing her fluid intake.  Will also obtain baseline labs including:  Bmet, cbc, lft and tsh. Advised pt to follow up with PCP in 2-3 weeks.

## 2017-02-09 ENCOUNTER — Ambulatory Visit: Payer: Self-pay | Admitting: Family Medicine

## 2017-03-22 LAB — HM MAMMOGRAPHY

## 2017-03-24 ENCOUNTER — Encounter: Payer: Self-pay | Admitting: Family Medicine

## 2017-04-14 ENCOUNTER — Other Ambulatory Visit: Payer: Self-pay | Admitting: Family Medicine

## 2017-04-14 DIAGNOSIS — K648 Other hemorrhoids: Secondary | ICD-10-CM

## 2017-05-04 ENCOUNTER — Ambulatory Visit (INDEPENDENT_AMBULATORY_CARE_PROVIDER_SITE_OTHER)
Admission: RE | Admit: 2017-05-04 | Discharge: 2017-05-04 | Disposition: A | Discharge status: Home / Self Care | Payer: Medicare Other | Source: Ambulatory Visit | Attending: Acute Care | Admitting: Acute Care

## 2017-05-04 DIAGNOSIS — Z87891 Personal history of nicotine dependence: Secondary | ICD-10-CM | POA: Diagnosis not present

## 2017-05-08 ENCOUNTER — Other Ambulatory Visit: Payer: Self-pay | Admitting: Acute Care

## 2017-05-08 DIAGNOSIS — Z87891 Personal history of nicotine dependence: Secondary | ICD-10-CM

## 2017-05-22 ENCOUNTER — Encounter: Payer: Medicare Other | Admitting: Family Medicine

## 2017-06-05 ENCOUNTER — Encounter: Payer: Self-pay | Admitting: Family Medicine

## 2017-06-05 ENCOUNTER — Ambulatory Visit (INDEPENDENT_AMBULATORY_CARE_PROVIDER_SITE_OTHER): Payer: Medicare Other | Admitting: Family Medicine

## 2017-06-05 VITALS — BP 126/62 | HR 64 | Temp 97.9°F | Resp 16 | Ht 62.7 in | Wt 121.8 lb

## 2017-06-05 DIAGNOSIS — E785 Hyperlipidemia, unspecified: Secondary | ICD-10-CM

## 2017-06-05 DIAGNOSIS — F324 Major depressive disorder, single episode, in partial remission: Secondary | ICD-10-CM

## 2017-06-05 DIAGNOSIS — Z0001 Encounter for general adult medical examination with abnormal findings: Secondary | ICD-10-CM

## 2017-06-05 DIAGNOSIS — Z0000 Encounter for general adult medical examination without abnormal findings: Secondary | ICD-10-CM

## 2017-06-05 DIAGNOSIS — K648 Other hemorrhoids: Secondary | ICD-10-CM

## 2017-06-05 LAB — POC URINALSYSI DIPSTICK (AUTOMATED)
Bilirubin, UA: NEGATIVE
Glucose, UA: NEGATIVE
Ketones, UA: NEGATIVE
Leukocytes, UA: NEGATIVE
Nitrite, UA: NEGATIVE
PROTEIN UA: NEGATIVE
RBC UA: NEGATIVE
SPEC GRAV UA: 1.015 (ref 1.010–1.025)
UROBILINOGEN UA: 0.2 U/dL
pH, UA: 6 (ref 5.0–8.0)

## 2017-06-05 MED ORDER — HYDROCORTISONE 2.5 % RE CREA: TOPICAL_CREAM | RECTAL | 2 refills | Status: DC

## 2017-06-05 MED ORDER — FLUOXETINE HCL 10 MG PO TABS: 10.0000 mg | ORAL_TABLET | Freq: Every day | ORAL | 3 refills | Status: DC

## 2017-06-05 NOTE — Progress Notes (Signed)
Subjective:   I acted as a Education administrator for Dr. Carollee Herter.  Guerry Bruin, CMA   Samantha Miranda is a 71 y.o. female and is here for a comprehensive physical exam. The patient reports no problems.  Social History   Social History  . Marital status: Married    Spouse name: N/A  . Number of children: N/A  . Years of education: N/A   Occupational History  .  [employer]    retired   Social History Main Topics  . Smoking status: Former Smoker    Packs/day: 1.00    Years: 40.00    Quit date: 05/03/2015  . Smokeless tobacco: Former Systems developer    Quit date: 08/26/2012     Comment: Pt currently using lowest strength vapes.  11/02/2015  . Alcohol use No  . Drug use: No  . Sexual activity: Not Currently   Other Topics Concern  . Not on file   Social History Narrative   Exercise-no      Health Maintenance  Topic Date Due  . INFLUENZA VACCINE  07/22/2017 (Originally 06/21/2017)  . MAMMOGRAM  03/22/2018  . TETANUS/TDAP  08/26/2020  . COLONOSCOPY  10/23/2021  . DEXA SCAN  Addressed  . Hepatitis C Screening  Completed  . PNA vac Low Risk Adult  Completed    The following portions of the patient's history were reviewed and updated as appropriate:  She  has a past medical history of Basal cell carcinoma of left lower leg; Cancer ([place]); Hyperlipidemia; and Osteopenia. She  does not have any pertinent problems on file. She  has a past surgical history that includes Abdominal hysterectomy (1991); Breast lumpectomy (9/07); Cataract extraction, bilateral; and Eye surgery (08/2013  10/2013). Her family history includes Breast cancer in her unknown relative; Cancer (age of onset: 58) in her sister; Colon cancer in her maternal grandfather; Diabetes in her maternal grandmother; Heart failure in her mother; Hyperlipidemia in her mother; Lung cancer in her unknown relative; Lung cancer (age of onset: 20) in her father. She  reports that she quit smoking about 2 years ago. She has a 40.00 pack-year  smoking history. She quit smokeless tobacco use about 4 years ago. She reports that she does not drink alcohol or use drugs. She has a current medication list which includes the following prescription(s): vitamin d, coenzyme q10, collagenase, fluoxetine, hydrocortisone, magnesium, misc natural products, multiple vitamins-minerals, OVER THE COUNTER MEDICATION, OVER THE COUNTER MEDICATION, OVER THE COUNTER MEDICATION, and xiidra. Current Outpatient Prescriptions on File Prior to Visit  Medication Sig Dispense Refill  . Cholecalciferol (VITAMIN D) 2000 UNITS tablet Take 4,000 Units by mouth daily.    . Coenzyme Q10 (COQ10 PO) Take 1 each by mouth daily.    . Collagenase POWD by Does not apply route.    Marland Kitchen MAGNESIUM PO Take 1 each by mouth daily.    . Misc Natural Products (CHOLESTEROL SUPPORT PO) Take by mouth.    . Multiple Vitamins-Minerals (MULTIVITAMIN ADULTS PO) Take 1 tablet by mouth daily.     No current facility-administered medications on file prior to visit.    She is allergic to amoxicillin-pot clavulanate and moxifloxacin..  Review of Systems Review of Systems  Constitutional: Negative for activity change, appetite change and fatigue.  HENT: Negative for hearing loss, congestion, tinnitus and ear discharge.  dentist q53m Eyes: Negative for visual disturbance (see optho q1y -- vision corrected to 20/20 with glasses).  Respiratory: Negative for cough, chest tightness and shortness of breath.  Cardiovascular: Negative for chest pain, palpitations and leg swelling.  Gastrointestinal: Negative for abdominal pain, diarrhea, constipation and abdominal distention.  Genitourinary: Negative for urgency, frequency, decreased urine volume and difficulty urinating.  Musculoskeletal: Negative for back pain, arthralgias and gait problem.  Skin: Negative for color change, pallor and rash.  Neurological: Negative for dizziness, light-headedness, numbness and headaches.  Hematological: Negative for  adenopathy. Does not bruise/bleed easily.  Psychiatric/Behavioral: Negative for suicidal ideas, confusion, sleep disturbance, self-injury, dysphoric mood, decreased concentration and agitation.       Objective:    BP 126/62   Pulse 64   Temp 97.9 F (36.6 C) (Oral)   Resp 16   Ht 5' 2.7" (1.593 m)   Wt 121 lb 12.8 oz (55.2 kg)   SpO2 95%   BMI 21.78 kg/m  General appearance: alert, cooperative, appears stated age and no distress Head: Normocephalic, without obvious abnormality, atraumatic Eyes: conjunctivae/corneas clear. PERRL, EOM's intact. Fundi benign. Ears: normal TM's and external ear canals both ears Nose: Nares normal. Septum midline. Mucosa normal. No drainage or sinus tenderness. Throat: lips, mucosa, and tongue normal; teeth and gums normal Neck: no adenopathy, no carotid bruit, no JVD, supple, symmetrical, trachea midline and thyroid not enlarged, symmetric, no tenderness/mass/nodules Back: symmetric, no curvature. ROM normal. No CVA tenderness. Lungs: clear to auscultation bilaterally Breasts: normal appearance, no masses or tenderness Heart: regular rate and rhythm, S1, S2 normal, no murmur, click, rub or gallop Abdomen: soft, non-tender; bowel sounds normal; no masses,  no organomegaly Pelvic: deferred Extremities: extremities normal, atraumatic, no cyanosis or edema Pulses: 2+ and symmetric Skin: Skin color, texture, turgor normal. No rashes or lesions Lymph nodes: Cervical, supraclavicular, and axillary nodes normal. Neurologic: Alert and oriented X 3, normal strength and tone. Normal symmetric reflexes. Normal coordination and gait    Assessment:    Healthy female exam.      Plan:    ghm utd Check labs  See After Visit Summary for Counseling Recommendations    1. Preventative health care See above  2. Other hemorrhoids   - hydrocortisone (ANUSOL-HC) 2.5 % rectal cream; place 1 APPLICATION rectally twice a day  Dispense: 35 g; Refill: 2  3.  Depression, major, single episode, in partial remission (HCC) Stable con't meds - FLUoxetine (PROZAC) 10 MG tablet; Take 1 tablet (10 mg total) by mouth daily.  Dispense: 90 tablet; Refill: 3  4. Hyperlipidemia LDL goal <100 Encouraged heart healthy diet, increase exercise, avoid trans fats, consider a krill oil cap daily  - POCT Urinalysis Dipstick (Automated) - Comprehensive metabolic panel - Lipid panel - CBC with Differential/Platelet

## 2017-06-05 NOTE — Patient Instructions (Signed)
Preventive Care 65 Years and Older, Female Preventive care refers to lifestyle choices and visits with your health care provider that can promote health and wellness. What does preventive care include?  A yearly physical exam. This is also called an annual well check.  Dental exams once or twice a year.  Routine eye exams. Ask your health care provider how often you should have your eyes checked.  Personal lifestyle choices, including: ? Daily care of your teeth and gums. ? Regular physical activity. ? Eating a healthy diet. ? Avoiding tobacco and drug use. ? Limiting alcohol use. ? Practicing safe sex. ? Taking low-dose aspirin every day. ? Taking vitamin and mineral supplements as recommended by your health care provider. What happens during an annual well check? The services and screenings done by your health care provider during your annual well check will depend on your age, overall health, lifestyle risk factors, and family history of disease. Counseling Your health care provider may ask you questions about your:  Alcohol use.  Tobacco use.  Drug use.  Emotional well-being.  Home and relationship well-being.  Sexual activity.  Eating habits.  History of falls.  Memory and ability to understand (cognition).  Work and work environment.  Reproductive health.  Screening You may have the following tests or measurements:  Height, weight, and BMI.  Blood pressure.  Lipid and cholesterol levels. These may be checked every 5 years, or more frequently if you are over 50 years old.  Skin check.  Lung cancer screening. You may have this screening every year starting at age 55 if you have a 30-pack-year history of smoking and currently smoke or have quit within the past 15 years.  Fecal occult blood test (FOBT) of the stool. You may have this test every year starting at age 50.  Flexible sigmoidoscopy or colonoscopy. You may have a sigmoidoscopy every 5 years or  a colonoscopy every 10 years starting at age 50.  Hepatitis C blood test.  Hepatitis B blood test.  Sexually transmitted disease (STD) testing.  Diabetes screening. This is done by checking your blood sugar (glucose) after you have not eaten for a while (fasting). You may have this done every 1-3 years.  Bone density scan. This is done to screen for osteoporosis. You may have this done starting at age 65.  Mammogram. This may be done every 1-2 years. Talk to your health care provider about how often you should have regular mammograms.  Talk with your health care provider about your test results, treatment options, and if necessary, the need for more tests. Vaccines Your health care provider may recommend certain vaccines, such as:  Influenza vaccine. This is recommended every year.  Tetanus, diphtheria, and acellular pertussis (Tdap, Td) vaccine. You may need a Td booster every 10 years.  Varicella vaccine. You may need this if you have not been vaccinated.  Zoster vaccine. You may need this after age 60.  Measles, mumps, and rubella (MMR) vaccine. You may need at least one dose of MMR if you were born in 1957 or later. You may also need a second dose.  Pneumococcal 13-valent conjugate (PCV13) vaccine. One dose is recommended after age 65.  Pneumococcal polysaccharide (PPSV23) vaccine. One dose is recommended after age 65.  Meningococcal vaccine. You may need this if you have certain conditions.  Hepatitis A vaccine. You may need this if you have certain conditions or if you travel or work in places where you may be exposed to hepatitis   A.  Hepatitis B vaccine. You may need this if you have certain conditions or if you travel or work in places where you may be exposed to hepatitis B.  Haemophilus influenzae type b (Hib) vaccine. You may need this if you have certain conditions.  Talk to your health care provider about which screenings and vaccines you need and how often you  need them. This information is not intended to replace advice given to you by your health care provider. Make sure you discuss any questions you have with your health care provider. Document Released: 12/04/2015 Document Revised: 07/27/2016 Document Reviewed: 09/08/2015 Elsevier Interactive Patient Education  2017 [employer].

## 2017-06-06 LAB — COMPREHENSIVE METABOLIC PANEL
ALBUMIN: 4.2 g/dL (ref 3.5–5.2)
ALT: 19 U/L (ref 0–35)
AST: 28 U/L (ref 0–37)
Alkaline Phosphatase: 53 U/L (ref 39–117)
BILIRUBIN TOTAL: 0.4 mg/dL (ref 0.2–1.2)
BUN: 17 mg/dL (ref 6–23)
CALCIUM: 9.9 mg/dL (ref 8.4–10.5)
CO2: 29 meq/L (ref 19–32)
Chloride: 101 mEq/L (ref 96–112)
Creatinine, Ser: 0.92 mg/dL (ref 0.40–1.20)
GFR: 64.1 mL/min (ref >60.00)
Glucose, Bld: 77 mg/dL (ref 70–99)
Potassium: 4 mEq/L (ref 3.5–5.1)
Sodium: 138 mEq/L (ref 135–145)
Total Protein: 7.4 g/dL (ref 6.0–8.3)

## 2017-06-06 LAB — CBC WITH DIFFERENTIAL/PLATELET
BASOS PCT: 1.7 % (ref 0.0–3.0)
Basophils Absolute: 0.1 10*3/uL (ref 0.0–0.1)
EOS PCT: 4.5 % (ref 0.0–5.0)
Eosinophils Absolute: 0.3 10*3/uL (ref 0.0–0.7)
HEMATOCRIT: 41.1 % (ref 36.0–46.0)
Hemoglobin: 14 g/dL (ref 12.0–15.0)
LYMPHS ABS: 2 10*3/uL (ref 0.7–4.0)
Lymphocytes Relative: 35.3 % (ref 12.0–46.0)
MCHC: 34 g/dL (ref 30.0–36.0)
MCV: 91.6 fl (ref 78.0–100.0)
MONOS PCT: 7.2 % (ref 3.0–12.0)
Monocytes Absolute: 0.4 10*3/uL (ref 0.1–1.0)
NEUTROS ABS: 3 10*3/uL (ref 1.4–7.7)
NEUTROS PCT: 51.3 % (ref 43.0–77.0)
PLATELETS: 200 10*3/uL (ref 150.0–400.0)
RBC: 4.48 Mil/uL (ref 3.87–5.11)
RDW: 13 % (ref 11.5–15.5)
WBC: 5.8 10*3/uL (ref 4.0–10.5)

## 2017-06-06 LAB — LIPID PANEL
CHOL/HDL RATIO: 4
CHOLESTEROL: 254 mg/dL — AB (ref 0–200)
HDL: 67 mg/dL (ref >39.00)
LDL Cholesterol: 170 mg/dL — ABNORMAL HIGH (ref 0–99)
NonHDL: 187.43
TRIGLYCERIDES: 86 mg/dL (ref 0.0–149.0)
VLDL: 17.2 mg/dL (ref 0.0–40.0)

## 2017-06-08 ENCOUNTER — Encounter: Payer: Self-pay | Admitting: Family Medicine

## 2017-06-27 IMAGING — CR DG CHEST 2V
2 series · 2 of 2 positions shown · non-contrast
Comparison: 08/12/2013

CLINICAL DATA: Cough for 4 days.

EXAM:
CHEST  2 VIEW

[w chest pa]
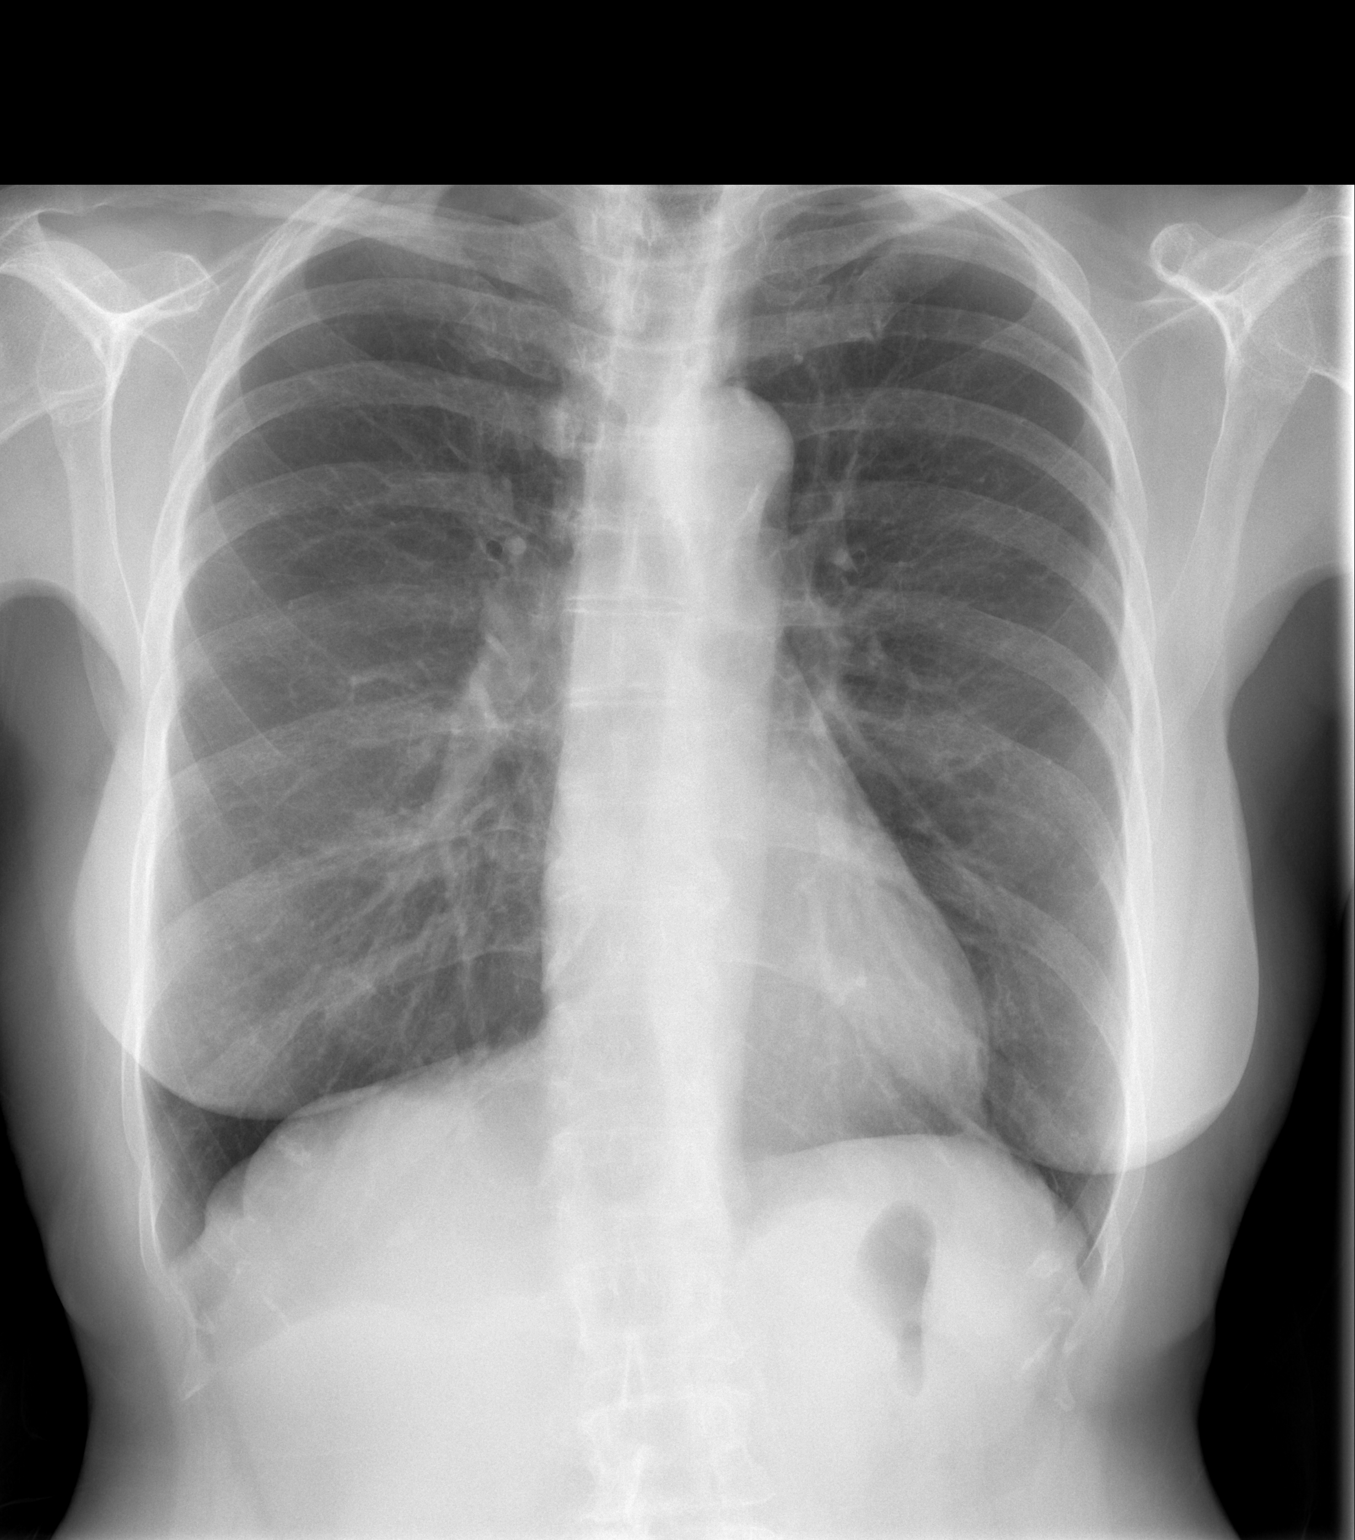

[w chest lat]
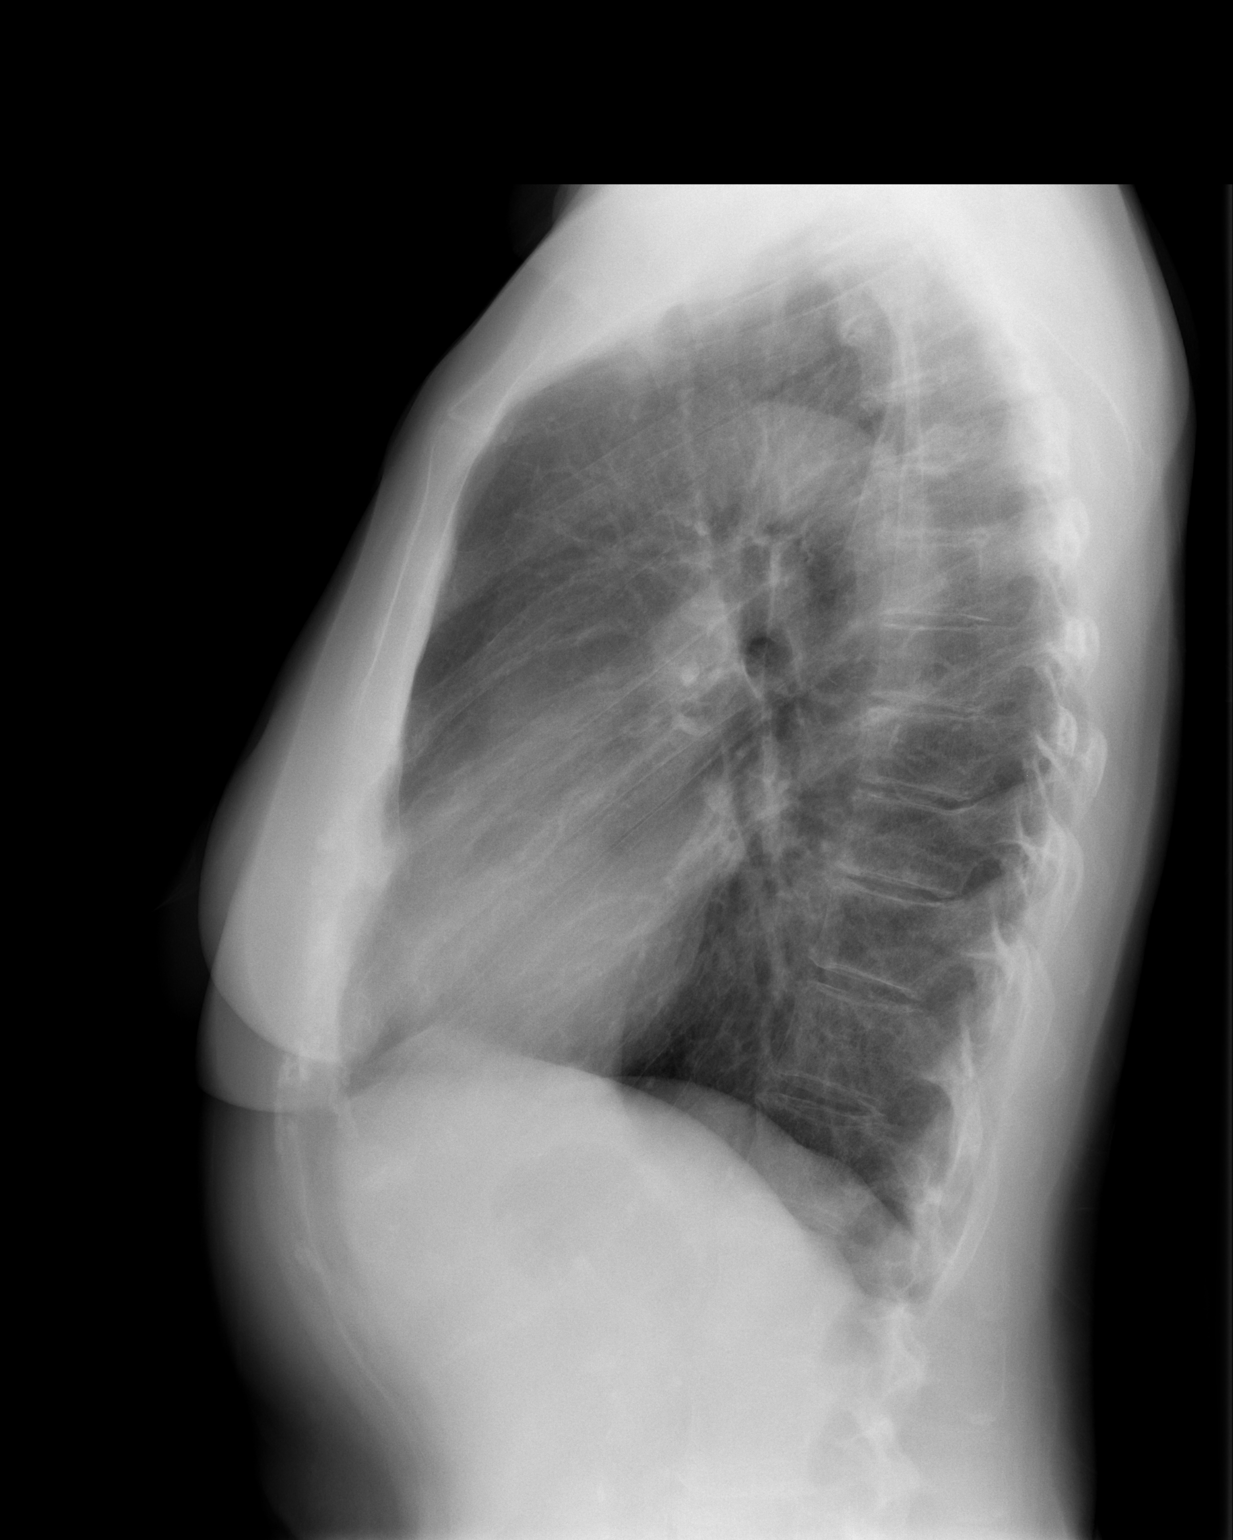

[2 of 2 positions shown; findings below may reference images not displayed]

FINDINGS: Heart size and pulmonary vascularity are normal. No infiltrates or
effusions. Slight peribronchial thickening. Slight thoracolumbar
scoliosis, unchanged. Aortic atherosclerosis.
IMPRESSION: Slight bronchitic changes.  Aortic atherosclerosis.

## 2017-08-03 ENCOUNTER — Telehealth: Payer: Self-pay | Admitting: Family Medicine

## 2017-08-03 NOTE — Telephone Encounter (Signed)
Pt scheduled a my-chart message to be seen for spells of shaking and weakness. Called pt directly and offered to have her speak with a nurse today. Pt declined. Scheduled pt for a sooner apt than scheduled via my chart.

## 2017-08-04 ENCOUNTER — Encounter: Payer: Self-pay | Admitting: Family Medicine

## 2017-08-04 ENCOUNTER — Ambulatory Visit (INDEPENDENT_AMBULATORY_CARE_PROVIDER_SITE_OTHER): Payer: Medicare Other | Admitting: Family Medicine

## 2017-08-04 VITALS — BP 112/70 | HR 74 | Temp 97.8°F | Ht 62.5 in | Wt 122.0 lb

## 2017-08-04 DIAGNOSIS — Z8719 Personal history of other diseases of the digestive system: Secondary | ICD-10-CM

## 2017-08-04 DIAGNOSIS — R1084 Generalized abdominal pain: Secondary | ICD-10-CM | POA: Diagnosis not present

## 2017-08-04 DIAGNOSIS — K22719 Barrett's esophagus with dysplasia, unspecified: Secondary | ICD-10-CM

## 2017-08-04 DIAGNOSIS — Z8711 Personal history of peptic ulcer disease: Secondary | ICD-10-CM

## 2017-08-04 DIAGNOSIS — E162 Hypoglycemia, unspecified: Secondary | ICD-10-CM

## 2017-08-04 DIAGNOSIS — R110 Nausea: Secondary | ICD-10-CM | POA: Diagnosis not present

## 2017-08-04 MED ORDER — RANITIDINE HCL 150 MG PO TABS: 150.0000 mg | ORAL_TABLET | Freq: Two times a day (BID) | ORAL | 2 refills | Status: DC

## 2017-08-04 NOTE — Patient Instructions (Addendum)
Barrett Esophagus  Barrett esophagus occurs when the tissue that lines the esophagus changes or becomes damaged. The esophagus is the tube that carries food from the throat to the stomach. With Barrett esophagus, the cells that line the esophagus are replaced by cells that are similar to the lining of the intestines (intestinal metaplasia).  Barrett esophagus itself may not cause any symptoms. However, many people who have Barrett esophagus also have gastroesophageal reflux disease (GERD), which may cause symptoms such as heartburn. Treatment may include medicines, procedures to destroy the abnormal cells, or surgery. Over time, a few people with this condition may develop cancer of the esophagus.  What are the causes?  The exact cause of this condition is not known. In some cases, the condition develops from damage to the lining of the esophagus caused by GERD. GERD occurs when stomach acids flow up from the stomach into the esophagus. Frequent symptoms of GERD may cause intestinal metaplasia or cause cell changes (dysplasia).  What increases the risk?  The following factors may make you more likely to develop this condition:   Having GERD.   Being any of the following:  ? Female.  ? White (Caucasian).  ? Obese.  ? Older than 50.   Having a hiatal hernia.   Smoking.    What are the signs or symptoms?  People with Barrett esophagus often have no symptoms. However, many people with this condition also have GERD. Symptoms of GERD may include:   Heartburn.   Difficulty swallowing.   Dry cough.    How is this diagnosed?  Barrett esophagus may be diagnosed with an exam called an upper gastrointestinal endoscopy. During this exam, a thin, flexible tube (endoscope) is passed down your esophagus. The endoscope has a light and camera on the end of it. Your health care provider uses the endoscope to view the inside of your esophagus. During the exam, several tissue samples will be removed (biopsy) from your esophagus so  they can be checked for intestinal metaplasia or dysplasia.  How is this treated?  Treatment for this condition may include:   Medicines (proton pump inhibitors, or PPIs) to decrease or stop GERD.   Periodic endoscopic exams to make sure that cancer is not developing.   A procedure or surgery for dysplasia. This may include:  ? Endoscopic removal or destruction of abnormal cells.  ? Removal of part of the esophagus (esophagectomy).    Follow these instructions at home:  Eating and drinking   Eat more fruits and vegetables.   Avoid fatty foods.   Eat small, frequent meals instead of large meals.   Avoid foods that cause heartburn. These foods include:  ? Coffee and alcoholic drinks.  ? Tomatoes and foods made with tomatoes.  ? Greasy or spicy foods.  ? Chocolate and peppermint.  General instructions     Take over-the-counter and prescription medicines only as told by your health care provider.   Do not use any tobacco products, such as cigarettes, chewing tobacco, and e-cigarettes. If you need help quitting, ask your health care provider.   If your health care provider is treating you for GERD, make sure you follow all instructions and take medicines as directed.   Keep all follow-up visits as told by your health care provider. This is important.  Contact a health care provider if:   You have heartburn or GERD symptoms.   You have difficulty swallowing.  Get help right away if:   You have chest   your health care provider. Document Released: 01/28/2004 Document Revised: 04/14/2016 Document Reviewed: 08/20/2015 Elsevier Interactive Patient Education  2018 [employer] ]   Food Choices for Gastroesophageal Reflux  Disease, Adult When you have gastroesophageal reflux disease (GERD), the foods you eat and your eating habits are very important. Choosing the right foods can help ease your discomfort. What guidelines do I need to follow?  Choose fruits, vegetables, whole grains, and low-fat dairy products.  Choose low-fat meat, fish, and poultry.  Limit fats such as oils, salad dressings, butter, nuts, and avocado.  Keep a food diary. This helps you identify foods that cause symptoms.  Avoid foods that cause symptoms. These may be different for everyone.  Eat small meals often instead of 3 large meals a day.  Eat your meals slowly, in a place where you are relaxed.  Limit fried foods.  Cook foods using methods other than frying.  Avoid drinking alcohol.  Avoid drinking large amounts of liquids with your meals.  Avoid bending over or lying down until 2-3 hours after eating. What foods are not recommended? These are some foods and drinks that may make your symptoms worse: Vegetables Tomatoes. Tomato juice. Tomato and spaghetti sauce. Chili peppers. Onion and garlic. Horseradish. Fruits Oranges, grapefruit, and lemon (fruit and juice). Meats High-fat meats, fish, and poultry. This includes hot dogs, ribs, ham, sausage, salami, and bacon. Dairy Whole milk and chocolate milk. Sour cream. Cream. Butter. Ice cream. Cream cheese. Drinks Coffee and tea. Bubbly (carbonated) drinks or energy drinks. Condiments Hot sauce. Barbecue sauce. Sweets/Desserts Chocolate and cocoa. Donuts. Peppermint and spearmint. Fats and Oils High-fat foods. This includes [place] fries and potato chips. Other Vinegar. Strong spices. This includes black pepper, white pepper, red pepper, cayenne, curry powder, cloves, ginger, and chili powder. The items listed above may not be a complete list of foods and drinks to avoid. Contact your dietitian for more information. This information is not intended to replace advice  given to you by your health care provider. Make sure you discuss any questions you have with your health care provider. Document Released: 05/08/2012 Document Revised: 04/14/2016 Document Reviewed: 09/11/2013 Elsevier Interactive Patient Education  2017 [employer].

## 2017-08-04 NOTE — Progress Notes (Signed)
Patient ID: Samantha Miranda, female    DOB: Sep 12, 1946  Age: 71 y.o. MRN: [id number]    Subjective:  Subjective  HPI Samantha Miranda presents for these spells--- food make her feel bad but when she doesn't eat she feels bad -- her stomach always feels empty and is gnawing at her.  ,  She feels like she is not completely emptying her bowels .  Her husband checked her sugar and it was 92 fasting and 100.  These episodes are occurring daily now and several x a day.  She was told by her previous GI she had barretts esophagus and and ulcer -- that was many years ago at [place] -- she is requesting to go to [facility].    Review of Systems  Constitutional: Positive for fatigue. Negative for activity change, appetite change and unexpected weight change.  Respiratory: Negative for cough and shortness of breath.   Cardiovascular: Negative for chest pain and palpitations.  Gastrointestinal: Positive for abdominal pain and nausea.  Neurological: Positive for tremors and weakness. Negative for dizziness, seizures, syncope, facial asymmetry, speech difficulty, light-headedness, numbness and headaches.  Psychiatric/Behavioral: Negative for behavioral problems and dysphoric mood. The patient is not nervous/anxious.     History Past Medical History:  Diagnosis Date  . Basal cell carcinoma of left lower leg   . Cancer [facility])    Breast cancer  . Hyperlipidemia   . Osteopenia     She has a past surgical history that includes Abdominal hysterectomy (1991); Breast lumpectomy (9/07); Cataract extraction, bilateral; and Eye surgery (08/2013  10/2013).   Her family history includes Breast cancer in her unknown relative; Cancer (age of onset: 23) in her sister; Colon cancer in her maternal grandfather; Diabetes in her maternal grandmother; Heart failure in her mother; Hyperlipidemia in her mother; Lung cancer in her unknown relative; Lung cancer (age of onset: 17) in her father.She reports that she quit smoking about 2  years ago. She has a 40.00 pack-year smoking history. She quit smokeless tobacco use about 4 years ago. She reports that she does not drink alcohol or use drugs.  Current Outpatient Prescriptions on File Prior to Visit  Medication Sig Dispense Refill  . Cholecalciferol (VITAMIN D) 2000 UNITS tablet Take 4,000 Units by mouth daily.    . Coenzyme Q10 (COQ10 PO) Take 1 each by mouth daily.    . Collagenase POWD by Does not apply route.    Marland Kitchen FLUoxetine (PROZAC) 10 MG tablet Take 1 tablet (10 mg total) by mouth daily. 90 tablet 3  . hydrocortisone (ANUSOL-HC) 2.5 % rectal cream place 1 APPLICATION rectally twice a day 35 g 2  . MAGNESIUM PO Take 1 each by mouth daily.    . Misc Natural Products (CHOLESTEROL SUPPORT PO) Take by mouth.    . Multiple Vitamins-Minerals (MULTIVITAMIN ADULTS PO) Take 1 tablet by mouth daily.    Marland Kitchen OVER THE COUNTER MEDICATION Take 1 capsule by mouth daily. Cholesterol solution vitamin    . OVER THE COUNTER MEDICATION Take 2 capsules by mouth daily. Lung and bronchial support vitamin    . OVER THE COUNTER MEDICATION Take 2 capsules by mouth daily. Omega Q Plus vitamin    . XIIDRA 5 % SOLN instill 1 drop into both eyes every 12 hours  1   No current facility-administered medications on file prior to visit.      Objective:  Objective  Physical Exam  Constitutional: She is oriented to person, place, and time. She appears  well-developed and well-nourished.  HENT:  Head: Normocephalic and atraumatic.  Eyes: Conjunctivae and EOM are normal.  Neck: Normal range of motion. Neck supple. No JVD present. Carotid bruit is not present. No thyromegaly present.  Cardiovascular: Normal rate, regular rhythm and normal heart sounds.   No murmur heard. Pulmonary/Chest: Effort normal and breath sounds normal. No respiratory distress. She has no wheezes. She has no rales. She exhibits no tenderness.  Abdominal: There is tenderness in the epigastric area. There is no rebound and no  guarding.  Musculoskeletal: She exhibits no edema.  Neurological: She is alert and oriented to person, place, and time.  Psychiatric: She has a normal mood and affect.  Nursing note and vitals reviewed.  BP 112/70 (BP Location: Left Arm, Patient Position: Sitting, Cuff Size: Normal)   Pulse 74   Temp 97.8 F (36.6 C) (Oral)   Ht 5' 2.5" (1.588 m)   Wt 122 lb (55.3 kg)   SpO2 95%   BMI 21.96 kg/m  Wt Readings from Last 3 Encounters:  08/04/17 122 lb (55.3 kg)  06/05/17 121 lb 12.8 oz (55.2 kg)  01/17/17 122 lb (55.3 kg)     Lab Results  Component Value Date   WBC 5.8 06/05/2017   HGB 14.0 06/05/2017   HCT 41.1 06/05/2017   PLT 200.0 06/05/2017   GLUCOSE 77 06/05/2017   CHOL 254 (H) 06/05/2017   TRIG 86.0 06/05/2017   HDL 67.00 06/05/2017   LDLDIRECT 176.2 08/12/2013   LDLCALC 170 (H) 06/05/2017   ALT 19 06/05/2017   AST 28 06/05/2017   NA 138 06/05/2017   K 4.0 06/05/2017   CL 101 06/05/2017   CREATININE 0.92 06/05/2017   BUN 17 06/05/2017   CO2 29 06/05/2017   TSH 0.92 01/17/2017   MICROALBUR 1.1 08/12/2013    Ct Chest Lung Cancer Screening Low Dose Wo Contrast  Result Date: 05/04/2017 CLINICAL DATA:  71 year old female with history of scratches 71 year old female former smoker (quit in 2016) with 39 pack year history of smoking. Lung cancer screening examination. EXAM: CT CHEST WITHOUT CONTRAST LOW-DOSE FOR LUNG CANCER SCREENING TECHNIQUE: Multidetector CT imaging of the chest was performed following the standard protocol without IV contrast. COMPARISON:  Low-dose lung cancer screening chest CT 05/03/2016. FINDINGS: Cardiovascular: Heart size is normal. There is no significant pericardial fluid, thickening or pericardial calcification. There is aortic atherosclerosis, as well as atherosclerosis of the great vessels of the mediastinum and the coronary arteries, including calcified atherosclerotic plaque in the left main and left circumflex coronary arteries.  Calcification of the medial aspect of the mitral annulus. Mediastinum/Nodes: No pathologically enlarged mediastinal are hilar lymph nodes. Please note that accurate exclusion of hilar adenopathy is limited on noncontrast CT scans. Esophagus is unremarkable in appearance. No axillary lymphadenopathy. Lungs/Pleura: Again noted are multiple small pulmonary nodules scattered throughout the lungs bilaterally, the largest of which has a volume derived mean diameter of 4.3 mm associated with the left major fissure in the posterior aspect of the left upper lobe (axial image 137 of series 3). Several new small nodules are noted throughout the lateral aspect of the right upper lobe in a peribronchovascular distribution fat, increased in number and size compared to the prior examination, most compatible with areas of chronic mucoid impaction, likely related to underlying atypical infection such as MAI (mycobacterium avium intracellulare). No acute consolidative airspace disease. No pleural effusions. Mild diffuse bronchial wall thickening with mild centrilobular and paraseptal emphysema. Upper Abdomen: Aortic atherosclerosis. Musculoskeletal: There are  no aggressive appearing lytic or blastic lesions noted in the visualized portions of the skeleton. IMPRESSION: 1. Lung-RADS 2S, benign appearance or behavior. Continue annual screening with low-dose chest CT without contrast in 12 months. 2. Aortic atherosclerosis, in addition to left main and left circumflex coronary artery disease. Assessment for potential risk factor modification, dietary therapy or pharmacologic therapy may be warranted, if clinically indicated. 3. Mild diffuse bronchial wall thickening with mild centrilobular and paraseptal emphysema; imaging findings suggestive of underlying COPD. 4. 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 Aortic Atherosclerosis (ICD10-I70.0) and Emphysema (ICD10-J43.9).  Electronically Signed   By: Vinnie Langton M.D.   On: 05/04/2017 16:31     Assessment & Plan:  Plan  I am having Ms. Pawling start on ranitidine. I am also having her maintain her Vitamin D, MAGNESIUM PO, Coenzyme Q10 (COQ10 PO), Multiple Vitamins-Minerals (MULTIVITAMIN ADULTS PO), Collagenase, Misc Natural Products (CHOLESTEROL SUPPORT PO), XIIDRA, OVER THE COUNTER MEDICATION, OVER THE COUNTER MEDICATION, OVER THE COUNTER MEDICATION, hydrocortisone, and FLUoxetine.  Meds ordered this encounter  Medications  . ranitidine (ZANTAC) 150 MG tablet    Sig: Take 1 tablet (150 mg total) by mouth 2 (two) times daily.    Dispense:  60 tablet    Refill:  2    Problem List Items Addressed This Visit      Unprioritized   Barrett esophagus   Relevant Orders   Ambulatory referral to Gastroenterology    Other Visit Diagnoses    Nausea    -  Primary   Relevant Medications   ranitidine (ZANTAC) 150 MG tablet   Other Relevant Orders   Ambulatory referral to Gastroenterology   CBC w/Diff   TSH   Comprehensive metabolic panel   Generalized abdominal pain       Relevant Orders   CBC w/Diff   TSH   Comprehensive metabolic panel   History of gastric ulcer       Relevant Orders   Ambulatory referral to Gastroenterology   Hypoglycemia       Relevant Orders   Hemoglobin A1c      Follow-up: No Follow-up on file.  Ann Held, DO

## 2017-08-05 LAB — COMPREHENSIVE METABOLIC PANEL
AG Ratio: 1.5 (calc) (ref 1.0–2.5)
ALKALINE PHOSPHATASE (APISO): 58 U/L (ref 33–130)
ALT: 17 U/L (ref 6–29)
AST: 22 U/L (ref 10–35)
Albumin: 4.1 g/dL (ref 3.6–5.1)
BILIRUBIN TOTAL: 0.4 mg/dL (ref 0.2–1.2)
BUN/Creatinine Ratio: 16 (calc) (ref 6–22)
BUN: 15 mg/dL (ref 7–25)
CALCIUM: 9.5 mg/dL (ref 8.6–10.4)
CO2: 26 mmol/L (ref 20–32)
Chloride: 103 mmol/L (ref 98–110)
Creat: 0.94 mg/dL — ABNORMAL HIGH (ref 0.60–0.93)
Globulin: 2.8 g/dL (calc) (ref 1.9–3.7)
Glucose, Bld: 86 mg/dL (ref 65–99)
Potassium: 4.7 mmol/L (ref 3.5–5.3)
SODIUM: 140 mmol/L (ref 135–146)
TOTAL PROTEIN: 6.9 g/dL (ref 6.1–8.1)

## 2017-08-05 LAB — CBC WITH DIFFERENTIAL/PLATELET
BASOS ABS: 80 {cells}/uL (ref 0–200)
BASOS PCT: 1.4 %
EOS ABS: 302 {cells}/uL (ref 15–500)
EOS PCT: 5.3 %
HCT: 38.5 % (ref 35.0–45.0)
HEMOGLOBIN: 13 g/dL (ref 11.7–15.5)
Lymphs Abs: 1813 cells/uL (ref 850–3900)
MCH: 30.2 pg (ref 27.0–33.0)
MCHC: 33.8 g/dL (ref 32.0–36.0)
MCV: 89.5 fL (ref 80.0–100.0)
MONOS PCT: 7.2 %
MPV: 10.3 fL (ref 7.5–12.5)
NEUTROS ABS: 3095 {cells}/uL (ref 1500–7800)
Neutrophils Relative %: 54.3 %
Platelets: 210 10*3/uL (ref 140–400)
RBC: 4.3 10*6/uL (ref 3.80–5.10)
RDW: 12.5 % (ref 11.0–15.0)
TOTAL LYMPHOCYTE: 31.8 %
WBC mixed population: 410 cells/uL (ref 200–950)
WBC: 5.7 10*3/uL (ref 3.8–10.8)

## 2017-08-05 LAB — HEMOGLOBIN A1C
EAG (MMOL/L): 5.5 (calc)
HEMOGLOBIN A1C: 5.1 %{Hb} (ref <5.7)
Mean Plasma Glucose: 100 (calc)

## 2017-08-05 LAB — TSH: TSH: 1.07 mIU/L (ref 0.40–4.50)

## 2017-08-07 NOTE — Telephone Encounter (Signed)
Today patient is feeling better/no more spells this weekend. Still having some stomach pain--patient is taking zantac twice a day and does think this helps.

## 2017-08-09 ENCOUNTER — Ambulatory Visit (INDEPENDENT_AMBULATORY_CARE_PROVIDER_SITE_OTHER): Payer: Medicare Other | Admitting: Gastroenterology

## 2017-08-09 ENCOUNTER — Encounter: Payer: Self-pay | Admitting: Gastroenterology

## 2017-08-09 VITALS — BP 124/70 | HR 76 | Ht 62.25 in | Wt 122.5 lb

## 2017-08-09 DIAGNOSIS — G8929 Other chronic pain: Secondary | ICD-10-CM | POA: Diagnosis not present

## 2017-08-09 DIAGNOSIS — K219 Gastro-esophageal reflux disease without esophagitis: Secondary | ICD-10-CM

## 2017-08-09 DIAGNOSIS — R1013 Epigastric pain: Secondary | ICD-10-CM | POA: Diagnosis not present

## 2017-08-09 DIAGNOSIS — Z86010 Personal history of colonic polyps: Secondary | ICD-10-CM

## 2017-08-09 NOTE — Patient Instructions (Signed)
If you are age 70 or older, your body mass index should be between 23-30. Your Body mass index is 22.23 kg/m. If this is out of the aforementioned range listed, please consider follow up with your Primary Care Provider.  If you are age 15 or younger, your body mass index should be between 19-25. Your Body mass index is 22.23 kg/m. If this is out of the aformentioned range listed, please consider follow up with your Primary Care Provider.   You have been scheduled for an endoscopy. Please follow written instructions given to you at your visit today. If you use inhalers (even only as needed), please bring them with you on the day of your procedure. Your physician has requested that you go to www.[email or web address].com and enter the access code given to you at your visit today. This web site gives a general overview about your procedure. However, you should still follow specific instructions given to you by our office regarding your preparation for the procedure.  Thank you for choosing [facility]  Dr Wilfrid Lund III

## 2017-08-09 NOTE — Progress Notes (Signed)
[facility] Consult Note:  History: Samantha Miranda 08/09/2017  Referring physician: Carollee Herter, Alferd Apa, DO  Reason for consult/chief complaint: abdominal discomfort (epigastric, gnawing feeling all the time and feel hungry all the time, hx of barrett's gastric ulcers, better since being on Zantac) and Nausea   Subjective  HPI:  This is a 71 year old woman referred to me for recent onset epigastric pain as well as a history of GERD and possible history of Barrett's esophagus. A few weeks ago she started having episodes that she describes as generalized tremulousness and shaking, global weakness, and a gnawing feeling in her stomach. They would occur a few times a day, and she felt that eating was the only thing that would make it better. She wondered if they might be episodes of low blood glucose even though she is not diabetic. Her diabetic husband checked her blood sugar and she reports that it was anywhere from 8210 during these episodes. Last week she saw primary care for this, and was given Zantac with some improvement so far. She had started aspirin a few months prior, but stopped it when these episodes occurred. She denies early satiety, nausea, vomiting, dysphagia or heartburn. She previously been a patient of Dr. Ferdinand Lango at [facility], and we have some endoscopic and pathology reports from his clinic recorded below. In some, the last colonoscopy in 2012 reportedly had a poor preparation and polyps of unknown pathology were removed. She was instructed to come back in 6 months for a colonoscopy, but says she lost faith and Dr. Ferdinand Lango and decided not to do so. She had been getting upper endoscopies regularly for Barrett's esophagus, though there was no Barrett's described on the biopsies in 2012.  Those biopsies indicated changes of reflux. She reportedly had an ulcer, and then a follow-up upper endoscopy in April 2012 only indicated that an ulcer was  "healed". Her last procedure with this clinic was a colonoscopy with Dr. Carlean Purl in 2008, were polyps of unknown pathology were removed.  Samantha Miranda reports that her insurance company contacted her in the last several months offering colon cancer screening, and she received some stool tests in the mail. She is not certain what they were, so she brought the report to [facility]. It was only after she left the clinic today that my staff was able to find this in the report, and it turns out to be a negative Fit test.  ROS:  Review of Systems  Constitutional: Negative for appetite change and unexpected weight change.  HENT: Negative for mouth sores and voice change.   Eyes: Negative for pain and redness.  Respiratory: Negative for cough and shortness of breath.   Cardiovascular: Negative for chest pain and palpitations.  Genitourinary: Negative for dysuria and hematuria.  Musculoskeletal: Negative for arthralgias and myalgias.  Skin: Negative for pallor and rash.  Neurological: Negative for weakness and headaches.  Hematological: Negative for adenopathy.     Past Medical History: Past Medical History:  Diagnosis Date  . Barrett's esophagus   . Basal cell carcinoma of left lower leg   . Breast cancer ([place])   . Gastric ulcer   . GERD (gastroesophageal reflux disease)   . History of colon polyps   . Hyperlipidemia   . Osteopenia      Past Surgical History: Past Surgical History:  Procedure Laterality Date  . ABDOMINAL HYSTERECTOMY  1991  . BREAST LUMPECTOMY  9/07  . CATARACT EXTRACTION, BILATERAL    . EYE  SURGERY  08/2013  10/2013   cataracts     Family History: Family History  Problem Relation Age of Onset  . Heart failure Mother        CHF  . Hyperlipidemia Mother   . Lung cancer Father 32  . Breast cancer Sister 50  . Colon cancer Maternal Grandfather   . Diabetes Maternal Grandmother   . Heart attack Neg Hx   . Hypertension Neg Hx   . Sudden death Neg Hx     Social  History: Social History   Social History  . Marital status: Married    Spouse name: N/A  . Number of children: 1  . Years of education: N/A   Occupational History  . retired [employer]    retired   Social History Main Topics  . Smoking status: Former Smoker    Packs/day: 1.00    Years: 40.00    Quit date: 07/2011  . Smokeless tobacco: Never Used     Comment: Pt currently using lowest strength vapes.  11/02/2015  . Alcohol use No  . Drug use: No  . Sexual activity: Not Currently   Other Topics Concern  . None   Social History Narrative   Exercise-no       Allergies: Allergies  Allergen Reactions  . Amoxicillin-Pot Clavulanate Nausea And Vomiting  . Moxifloxacin Nausea And Vomiting    Outpatient Meds: Current Outpatient Prescriptions  Medication Sig Dispense Refill  . Cholecalciferol (VITAMIN D) 2000 UNITS tablet Take 4,000 Units by mouth daily.    . Coenzyme Q10 (COQ10 PO) Take 1 each by mouth daily.    . Collagenase POWD by Does not apply route.    Marland Kitchen FLUoxetine (PROZAC) 10 MG tablet Take 1 tablet (10 mg total) by mouth daily. 90 tablet 3  . hydrocortisone (ANUSOL-HC) 2.5 % rectal cream place 1 APPLICATION rectally twice a day 35 g 2  . MAGNESIUM PO Take 1 each by mouth daily.    . Misc Natural Products (CHOLESTEROL SUPPORT PO) Take by mouth.    . Multiple Vitamins-Minerals (MULTIVITAMIN ADULTS PO) Take 1 tablet by mouth daily.    Marland Kitchen OVER THE COUNTER MEDICATION Take 1 capsule by mouth daily. Cholesterol solution vitamin    . OVER THE COUNTER MEDICATION Take 2 capsules by mouth daily. Lung and bronchial support vitamin    . OVER THE COUNTER MEDICATION Take 2 capsules by mouth daily. Omega Q Plus vitamin    . ranitidine (ZANTAC) 150 MG tablet Take 1 tablet (150 mg total) by mouth 2 (two) times daily. 60 tablet 2  . XIIDRA 5 % SOLN instill 1 drop into both eyes every 12 hours  1   No current facility-administered medications for this visit.        ___________________________________________________________________ Objective   Exam:  BP 124/70 (BP Location: Left Arm, Patient Position: Sitting, Cuff Size: Normal)   Pulse 76   Ht 5' 2.25" (1.581 m) Comment: height measured without shoes  Wt 122 lb 8 oz (55.6 kg)   BMI 22.23 kg/m    General: this is a(n) Well-appearing and pleasant woman   Eyes: sclera anicteric, no redness  ENT: oral mucosa moist without lesions, no cervical or supraclavicular lymphadenopathy, good dentition  CV: RRR without murmur, S1/S2, no JVD, no peripheral edema  Resp: clear to auscultation bilaterally, normal RR and effort noted  GI: soft, no tenderness, with active bowel sounds. No guarding or palpable organomegaly noted.  Skin; warm and dry, no  rash or jaundice noted  Neuro: awake, alert and oriented x 3. Normal gross motor function and fluent speech   Radiologic Studies:  Colon Ferdinand Lango) 2012 - ? polyp EGD 10/12 - " Barretts', but no present on Barrett's (still recalled her for barrett's afterwards) 4/12 egd: "ulcer healed'  10/08 Carlean Purl colon: polyps  Assessment: Encounter Diagnoses  Name Primary?  . Abdominal pain, chronic, epigastric Yes  . Gastroesophageal reflux disease without esophagitis   . History of colonic polyps     The nature of these recent symptoms is unclear. I do not sound typical for an ulcer. I'm not certain if the digestive symptoms are the primary cause of her shaky and weak feelings, or if they are secondary to episodes that are of a non-digestive causes. We also have this unresolved issue of whether or not she truly has Barrett's. The quality and accuracy of the previous endoscopic and biopsy findings are questionable in my opinion.  Plan:  Upper endoscopy  When I see her for that, I will have a further discussion with her about colon cancer screening. While fit testing is included in the available colon cancer screening test, I feel colonoscopy would  be a superior screening test for her.  Thank you for the courtesy of this consult.  Please call me with any questions or concerns.  Nelida Meuse III  CC: Ann Held, DO

## 2017-08-14 ENCOUNTER — Ambulatory Visit: Payer: Self-pay | Admitting: Family Medicine

## 2017-08-15 ENCOUNTER — Encounter: Payer: Self-pay | Admitting: Gastroenterology

## 2017-08-21 ENCOUNTER — Encounter: Payer: Self-pay | Admitting: Gastroenterology

## 2017-08-21 ENCOUNTER — Ambulatory Visit (AMBULATORY_SURGERY_CENTER): Payer: Medicare Other | Admitting: Gastroenterology

## 2017-08-21 VITALS — BP 121/68 | HR 56 | Temp 97.8°F | Resp 13 | Ht 62.25 in | Wt 122.0 lb

## 2017-08-21 DIAGNOSIS — R1013 Epigastric pain: Secondary | ICD-10-CM | POA: Diagnosis not present

## 2017-08-21 DIAGNOSIS — K2970 Gastritis, unspecified, without bleeding: Secondary | ICD-10-CM

## 2017-08-21 DIAGNOSIS — K2990 Gastroduodenitis, unspecified, without bleeding: Secondary | ICD-10-CM

## 2017-08-21 MED ORDER — SODIUM CHLORIDE 0.9 % IV SOLN: 500.0000 mL | INTRAVENOUS | Status: DC

## 2017-08-21 NOTE — Patient Instructions (Addendum)
Discharge instructions given. Biopsies taken. Resume previous medications. YOU HAD AN ENDOSCOPIC PROCEDURE TODAY AT THE [facility]:   Refer to the procedure report that was given to you for any specific questions about what was found during the examination.  If the procedure report does not answer your questions, please call your gastroenterologist to clarify.  If you requested that your care partner not be given the details of your procedure findings, then the procedure report has been included in a sealed envelope for you to review at your convenience later.  YOU SHOULD EXPECT: Some feelings of bloating in the abdomen. Passage of more gas than usual.  Walking can help get rid of the air that was put into your GI tract during the procedure and reduce the bloating. If you had a lower endoscopy (such as a colonoscopy or flexible sigmoidoscopy) you may notice spotting of blood in your stool or on the toilet paper. If you underwent a bowel prep for your procedure, you may not have a normal bowel movement for a few days.  Please Note:  You might notice some irritation and congestion in your nose or some drainage.  This is from the oxygen used during your procedure.  There is no need for concern and it should clear up in a day or so.  SYMPTOMS TO REPORT IMMEDIATELY:   Following upper endoscopy (EGD)  Vomiting of blood or coffee ground material  New chest pain or pain under the shoulder blades  Painful or persistently difficult swallowing  New shortness of breath  Fever of 100F or higher  Black, tarry-looking stools  For urgent or emergent issues, a gastroenterologist can be reached at any hour by calling [phone].   DIET:  We do recommend a small meal at first, but then you may proceed to your regular diet.  Drink plenty of fluids but you should avoid alcoholic beverages for 24 hours.  ACTIVITY:  You should plan to take it easy for the rest of today and you should NOT DRIVE  or use heavy machinery until tomorrow (because of the sedation medicines used during the test).    FOLLOW UP: Our staff will call the number listed on your records the next business day following your procedure to check on you and address any questions or concerns that you may have regarding the information given to you following your procedure. If we do not reach you, we will leave a message.  However, if you are feeling well and you are not experiencing any problems, there is no need to return our call.  We will assume that you have returned to your regular daily activities without incident.  If any biopsies were taken you will be contacted by phone or by letter within the next 1-3 weeks.  Please call us at [phone] if you have not heard about the biopsies in 3 weeks.    SIGNATURES/CONFIDENTIALITY: You and/or your care partner have signed paperwork which will be entered into your electronic medical record.  These signatures attest to the fact that that the information above on your After Visit Summary has been reviewed and is understood.  Full responsibility of the confidentiality of this discharge information lies with you and/or your care-partner. 

## 2017-08-21 NOTE — Progress Notes (Signed)
Pt's states no medical or surgical changes since previsit or office visit. 

## 2017-08-21 NOTE — Progress Notes (Signed)
Called to room to assist during endoscopic procedure.  Patient ID and intended procedure confirmed with present staff. Received instructions for my participation in the procedure from the performing physician.  

## 2017-08-21 NOTE — Op Note (Signed)
[facility] Patient Name: Samantha Miranda Procedure Date: 08/21/2017 10:28 AM MRN: [id number] Endoscopist: Alba. Loletha Carrow , MD Age: 71 Referring MD:  Date of Birth: 1946/11/19 Gender: Female Account #: [id number] Procedure:                Upper GI endoscopy Indications:              "gnawing" Epigastric abdominal discomfort occuring                            during episodes of dizziness, improves with eating                            and lately somewhat improved with ranitidine Medicines:                Monitored Anesthesia Care Procedure:                Pre-Anesthesia Assessment:                           - Prior to the procedure, a History and Physical                            was performed, and patient medications and                            allergies were reviewed. The patient's tolerance of                            previous anesthesia was also reviewed. The risks                            and benefits of the procedure and the sedation                            options and risks were discussed with the patient.                            All questions were answered, and informed consent                            was obtained. Prior Anticoagulants: The patient has                            taken no previous anticoagulant or antiplatelet                            agents. ASA Grade Assessment: II - A patient with                            mild systemic disease. After reviewing the risks                            and benefits, the patient was deemed in  satisfactory condition to undergo the procedure.                           After obtaining informed consent, the endoscope was                            passed under direct vision. Throughout the                            procedure, the patient's blood pressure, pulse, and                            oxygen saturations were monitored continuously. The   Endoscope was introduced through the mouth, and                            advanced to the second part of duodenum. The upper                            GI endoscopy was accomplished without difficulty.                            The patient tolerated the procedure well. Scope In: Scope Out: Findings:                 The esophagus was normal.                           Striped mildly erythematous mucosa was found in the                            gastric antrum. Biopsies were taken with a cold                            forceps for histology. (Sidney protocol).                           The cardia and gastric fundus were normal on                            retroflexion.                           The examined duodenum was normal. Complications:            No immediate complications. Estimated Blood Loss:     Estimated blood loss: none. Estimated blood loss                            was minimal. Impression:               - Normal esophagus.                           - Erythematous mucosa in the antrum. Biopsied.                           -  Normal examined duodenum.                           It is not clear if these symptoms are from a                            primary digestive condition. Recommendation:           - Patient has a contact number available for                            emergencies. The signs and symptoms of potential                            delayed complications were discussed with the                            patient. Return to normal activities tomorrow.                            Written discharge instructions were provided to the                            patient.                           - Resume previous diet.                           - Continue present medications.                           - Await pathology results.                           The patient was also advised to have a surveillance                            colonoscopy for a history of polyps  (of unknown                            pathology, outside institution) in 2012. She wants                            to give that further consideration. Henry L. Loletha Carrow, MD 08/21/2017 10:47:55 AM This report has been signed electronically.

## 2017-08-21 NOTE — Progress Notes (Signed)
Report given to PACU, vss 

## 2017-08-22 ENCOUNTER — Telehealth: Payer: Self-pay | Admitting: *Deleted

## 2017-08-22 NOTE — Telephone Encounter (Signed)
  Follow up Call-  Call back number 08/21/2017  Post procedure Call Back phone  # [phone]  Permission to leave phone message Yes  Some recent data might be hidden     Patient questions:  Do you have a fever, pain , or abdominal swelling? No. Pain Score  0 *  Have you tolerated food without any problems? Yes.    Have you been able to return to your normal activities? Yes.    Do you have any questions about your discharge instructions: Diet   No. Medications  Yes.   Follow up visit  No.  Do you have questions or concerns about your Care? No.  Actions: * If pain score is 4 or above: No action needed, pain <4.  Pt. Wondered if she would be given Rx. Based on findings yestereday.  I advised her that Dr. Loletha Carrow would decide upon further treatment based on pathology reports.  She verbalized understanding.

## 2017-09-29 ENCOUNTER — Encounter: Payer: Self-pay | Admitting: Family Medicine

## 2017-10-20 ENCOUNTER — Encounter: Payer: Self-pay | Admitting: Family Medicine

## 2017-10-20 NOTE — Telephone Encounter (Signed)
Please advise 

## 2017-10-20 NOTE — Telephone Encounter (Signed)
D/c zantac-----  protonix 40 mg 1 po qd  #90 11 refills    1 po qd

## 2017-10-25 ENCOUNTER — Encounter: Payer: Self-pay | Admitting: Family Medicine

## 2017-10-26 MED ORDER — PANTOPRAZOLE SODIUM 40 MG PO TBEC: 40.0000 mg | DELAYED_RELEASE_TABLET | Freq: Every day | ORAL | 11 refills | Status: DC

## 2017-10-26 NOTE — Telephone Encounter (Signed)
See previous note-- I had asked for protonix to be sent in

## 2017-10-26 NOTE — Telephone Encounter (Signed)
Ann Held, DO routed this conversation to [place], CMA  Ann Held, DO      10/20/17 9:36 AM  Note    D/c zantac-----  protonix 40 mg 1 po qd  #90 11 refills    1 po qd

## 2017-12-06 NOTE — Progress Notes (Deleted)
Subjective:   Samantha Miranda is a 72 y.o. female who presents for Medicare Annual (Subsequent) preventive examination. The Patient was informed that the wellness visit is to identify future health risk and educate and initiate measures that can reduce risk for increased disease through the lifespan.   Describes health as fair, good or great?  Review of Systems: No ROS.  Medicare Wellness Visit. Additional risk factors are reflected in the social history.   Sleep patterns:  Home Safety/Smoke Alarms: Feels safe in home. Smoke alarms in place.    Female:   Pap- hysterectomy      Mammo- last 03/24/17: BIRADS Category 2-benign      Dexa scan-        CCS- last 10/24/11: no recall on file.    Objective:     Vitals: There were no vitals taken for this visit.  There is no height or weight on file to calculate BMI.  Advanced Directives 08/21/2017 05/19/2016 03/19/2016 11/02/2015 03/25/2014  Does Patient Have a Medical Advance Directive? No Yes Yes No Patient does not have advance directive  Type of Advance Directive - [place];Living will [place];Living will - -  Does patient want to make changes to medical advance directive? - No - Patient declined - - -  Copy of [place] in Chart? - No - copy requested No - copy requested - -  Would patient like information on creating a medical advance directive? - - - No - patient declined information -    Tobacco Social History   Tobacco Use  Smoking Status Former Smoker  . Packs/day: 1.00  . Years: 40.00  . Pack years: 40.00  . Last attempt to quit: 07/2011  . Years since quitting: 6.3  Smokeless Tobacco Never Used  Tobacco Comment   Pt currently using lowest strength vapes.  11/02/2015     Counseling given: Not Answered Comment: Pt currently using lowest strength vapes.  11/02/2015   Clinical Intake:                       Past Medical History:  Diagnosis Date  .  Barrett's esophagus   . Basal cell carcinoma of left lower leg   . Breast cancer ([place])   . Gastric ulcer   . GERD (gastroesophageal reflux disease)   . History of colon polyps   . Hyperlipidemia   . Osteopenia    Past Surgical History:  Procedure Laterality Date  . ABDOMINAL HYSTERECTOMY  1991  . BREAST LUMPECTOMY  9/07  . CATARACT EXTRACTION, BILATERAL    . EYE SURGERY  08/2013  10/2013   cataracts   Family History  Problem Relation Age of Onset  . Heart failure Mother        CHF  . Hyperlipidemia Mother   . Lung cancer Father 71  . Breast cancer Sister 30  . Colon cancer Maternal Grandfather   . Diabetes Maternal Grandmother   . Heart attack Neg Hx   . Hypertension Neg Hx   . Sudden death Neg Hx   . Esophageal cancer Neg Hx   . Stomach cancer Neg Hx    Social History   Socioeconomic History  . Marital status: Married    Spouse name: Not on file  . Number of children: 1  . Years of education: Not on file  . Highest education level: Not on file  Social Needs  . Financial resource strain: Not  on file  . Food insecurity - worry: Not on file  . Food insecurity - inability: Not on file  . Transportation needs - medical: Not on file  . Transportation needs - non-medical: Not on file  Occupational History  . 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 Smoking status: Former Smoker    Packs/day: 1.00    Years: 40.00    Pack years: 40.00    Last attempt to quit: 07/2011    Years since quitting: 6.3  . Smokeless tobacco: Never Used  . Tobacco comment: Pt currently using lowest strength vapes.  11/02/2015  Substance and Sexual Activity  . Alcohol use: No    Alcohol/week: 0.0 oz  . Drug use: No  . Sexual activity: Not Currently  Other Topics Concern  . Not on file  Social History Narrative   Exercise-no    Outpatient Encounter Medications as of 12/12/2017  Medication Sig  . Cholecalciferol (VITAMIN D) 2000  UNITS tablet Take 4,000 Units by mouth daily.  . Coenzyme Q10 (COQ10 PO) Take 1 each by mouth daily.  . Collagenase POWD by Does not apply route.  Marland Kitchen FLUoxetine (PROZAC) 10 MG tablet Take 1 tablet (10 mg total) by mouth daily.  . hydrocortisone (ANUSOL-HC) 2.5 % rectal cream place 1 APPLICATION rectally twice a day  . MAGNESIUM PO Take 1 each by mouth daily.  . Misc Natural Products (CHOLESTEROL SUPPORT PO) Take by mouth.  . Multiple Vitamins-Minerals (MULTIVITAMIN ADULTS PO) Take 1 tablet by mouth daily.  Marland Kitchen OVER THE COUNTER MEDICATION Take 1 capsule by mouth daily. Cholesterol solution vitamin  . OVER THE COUNTER MEDICATION Take 2 capsules by mouth daily. Lung and bronchial support vitamin  . OVER THE COUNTER MEDICATION Take 2 capsules by mouth daily. Omega Q Plus vitamin  . pantoprazole (PROTONIX) 40 MG tablet Take 1 tablet (40 mg total) by mouth daily before breakfast.  . XIIDRA 5 % SOLN instill 1 drop into both eyes every 12 hours   No facility-administered encounter medications on file as of 12/12/2017.     Activities of Daily Living No flowsheet data found.  Patient Care Team: Carollee Herter, Alferd Apa, DO as PCP - General Shirley Muscat Loreen Freud, MD as Referring Physician (Optometry) Izora Ribas (Dermatology)    Assessment:   This is a routine wellness examination for [place]. Physical assessment deferred to PCP.  Exercise Activities and Dietary recommendations   Diet (meal preparation, eat out, water intake, caffeinated beverages, dairy products, fruits and vegetables): {Desc; diets:16563} Breakfast: Lunch:  Dinner:      Goals    . Receive flu shot today.  (pt-stated)    . Remain active and independent.   (pt-stated)     Continue with current routine.         Fall Risk Fall Risk  06/05/2017 05/19/2016 11/02/2015 04/21/2015 03/03/2014  Falls in the past year? No No No No No   Depression Screen PHQ 2/9 Scores 06/05/2017 05/19/2016 11/02/2015 04/21/2015  PHQ - 2 Score 0 0 0 0    PHQ- 9 Score - - - -  Exception Documentation - - - Patient refusal     Cognitive Function        Immunization History  Administered Date(s) Administered  . Influenza Split 09/22/2011  . Influenza, High Dose Seasonal PF 10/10/2014, 11/02/2015  . Influenza-Unspecified 08/26/2017  . Pneumococcal Conjugate-13 04/21/2015  . Pneumococcal Polysaccharide-23 03/03/2014  . Td 08/26/2010  .  Zoster 04/02/2009  . Zoster Recombinat (Shingrix) 06/11/2017, 08/26/2017    Qualifies for Shingles Vaccine?***  Screening Tests Health Maintenance  Topic Date Due  . MAMMOGRAM  03/22/2018  . TETANUS/TDAP  08/26/2020  . COLONOSCOPY  10/23/2021  . INFLUENZA VACCINE  Completed  . Hepatitis C Screening  Completed  . PNA vac Low Risk Adult  Completed  . DEXA SCAN  Addressed      Plan:   ***   I have personally reviewed and noted the following in the patient's chart:   . Medical and social history . Use of alcohol, tobacco or illicit drugs  . Current medications and supplements . Functional ability and status . Nutritional status . Physical activity . Advanced directives . List of other physicians . Hospitalizations, surgeries, and ER visits in previous 12 months . Vitals . Screenings to include cognitive, depression, and falls . Referrals and appointments  In addition, I have reviewed and discussed with patient certain preventive protocols, quality metrics, and best practice recommendations. A written personalized care plan for preventive services as well as general preventive health recommendations were provided to patient.     Shela Nevin, [place]  12/06/2017

## 2017-12-11 ENCOUNTER — Telehealth: Payer: Self-pay | Admitting: *Deleted

## 2017-12-11 NOTE — Telephone Encounter (Signed)
Called left message that RN Vevelyn Royals is out sick and we need to reschedule her appt for tomorrow at 1pm.

## 2017-12-12 ENCOUNTER — Ambulatory Visit: Payer: Medicare Other | Admitting: *Deleted

## 2017-12-15 NOTE — Progress Notes (Signed)
Subjective:   Samantha Miranda is a 72 y.o. female who presents for Medicare Annual (Subsequent) preventive examination. The Patient was informed that the wellness visit is to identify future health risk and educate and initiate measures that can reduce risk for increased disease through the lifespan.   Describes health as fair, good or great? good  Review of Systems: No ROS.  Medicare Wellness Visit. Additional risk factors are reflected in the social history. Cardiac Risk Factors include: advanced age (>28men, >34 women);dyslipidemia Sleep patterns: Pt reports no issues at all. Sleeps 7-8 hrs. Naps daily. Home Safety/Smoke Alarms: Feels safe in home. Smoke alarms in place. Lives in 2 story home. Lives with husband.  No issues with stairs.   Female:   Pap-   Hysterectomy.    Mammo-   Last 03/24/17: BI-RADS Category 2-benign    Dexa scan- Declines   CCS- last done 2012 with 1 yr recall. Pt states she will schedule this year.     Objective:     Vitals: BP 136/70 (BP Location: Left Arm, Patient Position: Sitting, Cuff Size: Normal)   Pulse 68   Ht 5\' 2"  (1.575 m)   Wt 123 lb 6.4 oz (56 kg)   SpO2 98%   BMI 22.57 kg/m   Body mass index is 22.57 kg/m.  Advanced Directives 12/19/2017 08/21/2017 05/19/2016 03/19/2016 11/02/2015 03/25/2014  Does Patient Have a Medical Advance Directive? No No Yes Yes No Patient does not have advance directive  Type of Advance Directive - - [place];Living will [place];Living will - -  Does patient want to make changes to medical advance directive? - - No - Patient declined - - -  Copy of [place] in Chart? - - No - copy requested No - copy requested - -  Would patient like information on creating a medical advance directive? Yes (MAU/Ambulatory/Procedural Areas - Information given) - - - No - patient declined information -    Tobacco Social History   Tobacco Use  Smoking Status Former Smoker  .  Packs/day: 1.00  . Years: 40.00  . Pack years: 40.00  . Last attempt to quit: 07/2011  . Years since quitting: 6.4  Smokeless Tobacco Never Used  Tobacco Comment   Pt currently using lowest strength vapes.  11/02/2015     Counseling given: Not Answered Comment: Pt currently using lowest strength vapes.  11/02/2015   Clinical Intake: Pain : No/denies pain   Past Medical History:  Diagnosis Date  . Barrett's esophagus   . Basal cell carcinoma of left lower leg   . Breast cancer ([place])   . Gastric ulcer   . GERD (gastroesophageal reflux disease)   . History of colon polyps   . Hyperlipidemia   . Osteopenia    Past Surgical History:  Procedure Laterality Date  . ABDOMINAL HYSTERECTOMY  1991  . BREAST LUMPECTOMY  9/07  . CATARACT EXTRACTION, BILATERAL    . EYE SURGERY  08/2013  10/2013   cataracts   Family History  Problem Relation Age of Onset  . Heart failure Mother        CHF  . Hyperlipidemia Mother   . Lung cancer Father 34  . Breast cancer Sister 19  . Colon cancer Maternal Grandfather   . Diabetes Maternal Grandmother   . Heart attack Neg Hx   . Hypertension Neg Hx   . Sudden death Neg Hx   . Esophageal cancer Neg Hx   .  Stomach cancer Neg Hx    Social History   Socioeconomic History  . Marital status: Married    Spouse name: None  . Number of children: 1  . Years of education: None  . Highest education level: None  Social Needs  . Financial resource strain: None  . Food insecurity - worry: None  . Food insecurity - inability: None  . Transportation needs - medical: None  . Transportation needs - non-medical: None  Occupational History  . 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 Smoking status: Former Smoker    Packs/day: 1.00    Years: 40.00    Pack years: 40.00    Last attempt to quit: 07/2011    Years since quitting: 6.4  . Smokeless tobacco: Never Used  . Tobacco comment: Pt currently  using lowest strength vapes.  11/02/2015  Substance and Sexual Activity  . Alcohol use: No    Alcohol/week: 0.0 oz  . Drug use: No  . Sexual activity: Yes  Other Topics Concern  . None  Social History Narrative   Exercise-no    Outpatient Encounter Medications as of 12/19/2017  Medication Sig  . Cholecalciferol (VITAMIN D) 2000 UNITS tablet Take 4,000 Units by mouth daily.  . Coenzyme Q10 (COQ10 PO) Take 1 each by mouth daily.  . Collagenase POWD by Does not apply route.  Marland Kitchen FLUoxetine (PROZAC) 10 MG tablet Take 1 tablet (10 mg total) by mouth daily.  . hydrocortisone (ANUSOL-HC) 2.5 % rectal cream place 1 APPLICATION rectally twice a day  . MAGNESIUM PO Take 1 each by mouth daily.  . Misc Natural Products (CHOLESTEROL SUPPORT PO) Take by mouth.  . Multiple Vitamins-Minerals (MULTIVITAMIN ADULTS PO) Take 1 tablet by mouth daily.  Marland Kitchen OVER THE COUNTER MEDICATION Take 1 capsule by mouth daily. Cholesterol solution vitamin  . OVER THE COUNTER MEDICATION Take 2 capsules by mouth daily. Lung and bronchial support vitamin  . OVER THE COUNTER MEDICATION Take 2 capsules by mouth daily. Omega Q Plus vitamin  . pantoprazole (PROTONIX) 40 MG tablet Take 1 tablet (40 mg total) by mouth daily before breakfast.  . XIIDRA 5 % SOLN instill 1 drop into both eyes every 12 hours   No facility-administered encounter medications on file as of 12/19/2017.     Activities of Daily Living In your present state of health, do you have any difficulty performing the following activities: 12/19/2017  Hearing? Y  Comment Wears Right hearing aid  Vision? N  Comment hx cataract sx. Has monolense.  Difficulty concentrating or making decisions? N  Walking or climbing stairs? N  Dressing or bathing? N  Doing errands, shopping? N  Preparing Food and eating ? N  Using the Toilet? N  In the past six months, have you accidently leaked urine? N  Do you have problems with loss of bowel control? N  Managing your  Medications? N  Managing your Finances? N  Housekeeping or managing your Housekeeping? N  Some recent data might be hidden    Patient Care Team: Carollee Herter, Alferd Apa, DO as PCP - General Shirley Muscat Loreen Freud, MD as Referring Physician (Optometry) Izora Ribas (Dermatology)    Assessment:   This is a routine wellness examination for [place]. Physical assessment deferred to PCP. Exercise Activities and Dietary recommendations Current Exercise Habits: The patient does not participate in regular exercise at present, Exercise limited by: None identified   Diet (meal  preparation, eat out, water intake, caffeinated beverages, dairy products, fruits and vegetables): well balanced, on average, 2 meals per day Breakfast: yogurt fruit  Lunch: meat and 2 vegetables. Dinner: (late lunch/ early dinner)     Goals    . Receive flu shot today.  (pt-stated)    . Remain active and independent.   (pt-stated)     Continue with current routine.         Fall Risk Fall Risk  12/19/2017 06/05/2017 05/19/2016 11/02/2015 04/21/2015  Falls in the past year? No No No No No   Depression Screen PHQ 2/9 Scores 12/19/2017 06/05/2017 05/19/2016 11/02/2015  PHQ - 2 Score 0 0 0 0  PHQ- 9 Score - - - -  Exception Documentation - - - -     Cognitive Function MMSE - Mini Mental State Exam 12/19/2017  Orientation to time 5  Orientation to Place 5  Registration 3  Attention/ Calculation 5  Recall 3  Language- name 2 objects 2  Language- repeat 1  Language- follow 3 step command 3  Language- read & follow direction 1  Write a sentence 1  Copy design 1  Total score 30        Immunization History  Administered Date(s) Administered  . Influenza Split 09/22/2011  . Influenza, High Dose Seasonal PF 10/10/2014, 11/02/2015  . Influenza-Unspecified 08/26/2017  . Pneumococcal Conjugate-13 04/21/2015  . Pneumococcal Polysaccharide-23 03/03/2014  . Td 08/26/2010  . Zoster 04/02/2009  . Zoster Recombinat  (Shingrix) 06/11/2017, 08/26/2017   Screening Tests Health Maintenance  Topic Date Due  . MAMMOGRAM  03/22/2018  . TETANUS/TDAP  08/26/2020  . COLONOSCOPY  10/23/2021  . INFLUENZA VACCINE  Completed  . Hepatitis C Screening  Completed  . PNA vac Low Risk Adult  Completed  . DEXA SCAN  Addressed      Plan:   Follow up with Dr.Lowne as scheduled 04/24/18.  Continue to eat heart healthy diet (full of fruits, vegetables, whole grains, lean protein, water--limit salt, fat, and sugar intake) and increase physical activity as tolerated.  Continue doing brain stimulating activities (puzzles, reading, adult coloring books, staying active) to keep memory sharp.   Bring a copy of your living will and/or healthcare power of attorney to your next office visit.    I have personally reviewed and noted the following in the patient's chart:   . Medical and social history . Use of alcohol, tobacco or illicit drugs  . Current medications and supplements . Functional ability and status . Nutritional status . Physical activity . Advanced directives . List of other physicians . Hospitalizations, surgeries, and ER visits in previous 12 months . Vitals . Screenings to include cognitive, depression, and falls . Referrals and appointments  In addition, I have reviewed and discussed with patient certain preventive protocols, quality metrics, and best practice recommendations. A written personalized care plan for preventive services as well as general preventive health recommendations were provided to patient.     Shela Nevin, [place]  12/19/2017

## 2017-12-19 ENCOUNTER — Encounter: Payer: Self-pay | Admitting: *Deleted

## 2017-12-19 ENCOUNTER — Ambulatory Visit (INDEPENDENT_AMBULATORY_CARE_PROVIDER_SITE_OTHER): Payer: Medicare Other | Admitting: *Deleted

## 2017-12-19 VITALS — BP 136/70 | HR 68 | Ht 62.0 in | Wt 123.4 lb

## 2017-12-19 DIAGNOSIS — Z0000 Encounter for general adult medical examination without abnormal findings: Secondary | ICD-10-CM | POA: Diagnosis not present

## 2017-12-19 NOTE — Patient Instructions (Signed)
Follow up with Dr.Lowne as scheduled 04/24/18.  Continue to eat heart healthy diet (full of fruits, vegetables, whole grains, lean protein, water--limit salt, fat, and sugar intake) and increase physical activity as tolerated.  Continue doing brain stimulating activities (puzzles, reading, adult coloring books, staying active) to keep memory sharp.   Bring a copy of your living will and/or healthcare power of attorney to your next office visit.   Samantha Miranda , Thank you for taking time to come for your Medicare Wellness Visit. I appreciate your ongoing commitment to your health goals. Please review the following plan we discussed and let me know if I can assist you in the future.   These are the goals we discussed: Goals    . Receive flu shot today.  (pt-stated)    . Remain active and independent.   (pt-stated)     Continue with current routine.         This is a list of the screening recommended for you and due dates:  Health Maintenance  Topic Date Due  . Mammogram  03/22/2018  . Tetanus Vaccine  08/26/2020  . Colon Cancer Screening  10/23/2021  . Flu Shot  Completed  .  Hepatitis C: One time screening is recommended by Center for Disease Control  (CDC) for  adults born from [birth year] through 1964.   Completed  . Pneumonia vaccines  Completed  . DEXA scan (bone density measurement)  Addressed    Health Maintenance for Postmenopausal Women Menopause is a normal process in which your reproductive ability comes to an end. This process happens gradually over a span of months to years, usually between the ages of 76 and 80. Menopause is complete when you have missed 12 consecutive menstrual periods. It is important to talk with your health care provider about some of the most common conditions that affect postmenopausal women, such as heart disease, cancer, and bone loss (osteoporosis). Adopting a healthy lifestyle and getting preventive care can help to promote your health and wellness. Those  actions can also lower your chances of developing some of these common conditions. What should I know about menopause? During menopause, you may experience a number of symptoms, such as:  Moderate-to-severe hot flashes.  Night sweats.  Decrease in sex drive.  Mood swings.  Headaches.  Tiredness.  Irritability.  Memory problems.  Insomnia.  Choosing to treat or not to treat menopausal changes is an individual decision that you make with your health care provider. What should I know about hormone replacement therapy and supplements? Hormone therapy products are effective for treating symptoms that are associated with menopause, such as hot flashes and night sweats. Hormone replacement carries certain risks, especially as you become older. If you are thinking about using estrogen or estrogen with progestin treatments, discuss the benefits and risks with your health care provider. What should I know about heart disease and stroke? Heart disease, heart attack, and stroke become more likely as you age. This may be due, in part, to the hormonal changes that your body experiences during menopause. These can affect how your body processes dietary fats, triglycerides, and cholesterol. Heart attack and stroke are both medical emergencies. There are many things that you can do to help prevent heart disease and stroke:  Have your blood pressure checked at least every 1-2 years. High blood pressure causes heart disease and increases the risk of stroke.  If you are 44-40 years old, ask your health care provider if you should take aspirin  to prevent a heart attack or a stroke.  Do not use any tobacco products, including cigarettes, chewing tobacco, or electronic cigarettes. If you need help quitting, ask your health care provider.  It is important to eat a healthy diet and maintain a healthy weight. ? Be sure to include plenty of vegetables, fruits, low-fat dairy products, and lean  protein. ? Avoid eating foods that are high in solid fats, added sugars, or salt (sodium).  Get regular exercise. This is one of the most important things that you can do for your health. ? Try to exercise for at least 150 minutes each week. The type of exercise that you do should increase your heart rate and make you sweat. This is known as moderate-intensity exercise. ? Try to do strengthening exercises at least twice each week. Do these in addition to the moderate-intensity exercise.  Know your numbers.Ask your health care provider to check your cholesterol and your blood glucose. Continue to have your blood tested as directed by your health care provider.  What should I know about cancer screening? There are several types of cancer. Take the following steps to reduce your risk and to catch any cancer development as early as possible. Breast Cancer  Practice breast self-awareness. ? This means understanding how your breasts normally appear and feel. ? It also means doing regular breast self-exams. Let your health care provider know about any changes, no matter how small.  If you are 64 or older, have a clinician do a breast exam (clinical breast exam or CBE) every year. Depending on your age, family history, and medical history, it may be recommended that you also have a yearly breast X-ray (mammogram).  If you have a family history of breast cancer, talk with your health care provider about genetic screening.  If you are at high risk for breast cancer, talk with your health care provider about having an MRI and a mammogram every year.  Breast cancer (BRCA) gene test is recommended for women who have family members with BRCA-related cancers. Results of the assessment will determine the need for genetic counseling and BRCA1 and for BRCA2 testing. BRCA-related cancers include these types: ? Breast. This occurs in males or females. ? Ovarian. ? Tubal. This may also be called fallopian tube  cancer. ? Cancer of the abdominal or pelvic lining (peritoneal cancer). ? Prostate. ? Pancreatic.  Cervical, Uterine, and Ovarian Cancer Your health care provider may recommend that you be screened regularly for cancer of the pelvic organs. These include your ovaries, uterus, and vagina. This screening involves a pelvic exam, which includes checking for microscopic changes to the surface of your cervix (Pap test).  For women ages 21-65, health care providers may recommend a pelvic exam and a Pap test every three years. For women ages 75-65, they may recommend the Pap test and pelvic exam, combined with testing for human papilloma virus (HPV), every five years. Some types of HPV increase your risk of cervical cancer. Testing for HPV may also be done on women of any age who have unclear Pap test results.  Other health care providers may not recommend any screening for nonpregnant women who are considered low risk for pelvic cancer and have no symptoms. Ask your health care provider if a screening pelvic exam is right for you.  If you have had past treatment for cervical cancer or a condition that could lead to cancer, you need Pap tests and screening for cancer for at least 20 years  after your treatment. If Pap tests have been discontinued for you, your risk factors (such as having a new sexual partner) need to be reassessed to determine if you should start having screenings again. Some women have medical problems that increase the chance of getting cervical cancer. In these cases, your health care provider may recommend that you have screening and Pap tests more often.  If you have a family history of uterine cancer or ovarian cancer, talk with your health care provider about genetic screening.  If you have vaginal bleeding after reaching menopause, tell your health care provider.  There are currently no reliable tests available to screen for ovarian cancer.  Lung Cancer Lung cancer screening is  recommended for adults 46-62 years old who are at high risk for lung cancer because of a history of smoking. A yearly low-dose CT scan of the lungs is recommended if you:  Currently smoke.  Have a history of at least 30 pack-years of smoking and you currently smoke or have quit within the past 15 years. A pack-year is smoking an average of one pack of cigarettes per day for one year.  Yearly screening should:  Continue until it has been 15 years since you quit.  Stop if you develop a health problem that would prevent you from having lung cancer treatment.  Colorectal Cancer  This type of cancer can be detected and can often be prevented.  Routine colorectal cancer screening usually begins at age 13 and continues through age 78.  If you have risk factors for colon cancer, your health care provider may recommend that you be screened at an earlier age.  If you have a family history of colorectal cancer, talk with your health care provider about genetic screening.  Your health care provider may also recommend using home test kits to check for hidden blood in your stool.  A small camera at the end of a tube can be used to examine your colon directly (sigmoidoscopy or colonoscopy). This is done to check for the earliest forms of colorectal cancer.  Direct examination of the colon should be repeated every 5-10 years until age 50. However, if early forms of precancerous polyps or small growths are found or if you have a family history or genetic risk for colorectal cancer, you may need to be screened more often.  Skin Cancer  Check your skin from head to toe regularly.  Monitor any moles. Be sure to tell your health care provider: ? About any new moles or changes in moles, especially if there is a change in a mole's shape or color. ? If you have a mole that is larger than the size of a pencil eraser.  If any of your family members has a history of skin cancer, especially at a young age,  talk with your health care provider about genetic screening.  Always use sunscreen. Apply sunscreen liberally and repeatedly throughout the day.  Whenever you are outside, protect yourself by wearing long sleeves, pants, a wide-brimmed hat, and sunglasses.  What should I know about osteoporosis? Osteoporosis is a condition in which bone destruction happens more quickly than new bone creation. After menopause, you may be at an increased risk for osteoporosis. To help prevent osteoporosis or the bone fractures that can happen because of osteoporosis, the following is recommended:  If you are 77-65 years old, get at least 1,000 mg of calcium and at least 600 mg of vitamin D per day.  If you are older  than age 6 but younger than age 79, get at least 1,200 mg of calcium and at least 600 mg of vitamin D per day.  If you are older than age 63, get at least 1,200 mg of calcium and at least 800 mg of vitamin D per day.  Smoking and excessive alcohol intake increase the risk of osteoporosis. Eat foods that are rich in calcium and vitamin D, and do weight-bearing exercises several times each week as directed by your health care provider. What should I know about how menopause affects my mental health? Depression may occur at any age, but it is more common as you become older. Common symptoms of depression include:  Low or sad mood.  Changes in sleep patterns.  Changes in appetite or eating patterns.  Feeling an overall lack of motivation or enjoyment of activities that you previously enjoyed.  Frequent crying spells.  Talk with your health care provider if you think that you are experiencing depression. What should I know about immunizations? It is important that you get and maintain your immunizations. These include:  Tetanus, diphtheria, and pertussis (Tdap) booster vaccine.  Influenza every year before the flu season begins.  Pneumonia vaccine.  Shingles vaccine.  Your health care  provider may also recommend other immunizations. This information is not intended to replace advice given to you by your health care provider. Make sure you discuss any questions you have with your health care provider. Document Released: 12/30/2005 Document Revised: 05/27/2016 Document Reviewed: 08/11/2015 Elsevier Interactive Patient Education  2018 [employer].

## 2018-02-14 ENCOUNTER — Encounter: Payer: Self-pay | Admitting: Medical

## 2018-02-14 ENCOUNTER — Ambulatory Visit: Payer: Medicare Other | Admitting: Medical

## 2018-02-14 VITALS — BP 146/75 | HR 92 | Temp 99.2°F | Resp 18 | Ht 62.0 in | Wt 123.4 lb

## 2018-02-14 DIAGNOSIS — R52 Pain, unspecified: Secondary | ICD-10-CM

## 2018-02-14 DIAGNOSIS — R05 Cough: Secondary | ICD-10-CM | POA: Diagnosis not present

## 2018-02-14 DIAGNOSIS — R059 Cough, unspecified: Secondary | ICD-10-CM

## 2018-02-14 DIAGNOSIS — J40 Bronchitis, not specified as acute or chronic: Secondary | ICD-10-CM

## 2018-02-14 DIAGNOSIS — J111 Influenza due to unidentified influenza virus with other respiratory manifestations: Secondary | ICD-10-CM

## 2018-02-14 DIAGNOSIS — R509 Fever, unspecified: Secondary | ICD-10-CM | POA: Diagnosis not present

## 2018-02-14 LAB — POC INFLUENZA A&B (BINAX/QUICKVUE)
Influenza A, POC: POSITIVE — AB
Influenza B, POC: NEGATIVE

## 2018-02-14 MED ORDER — FLUTICASONE PROPIONATE 50 MCG/ACT NA SUSP: 2.0000 | Freq: Every day | NASAL | 1 refills | Status: DC

## 2018-02-14 MED ORDER — OSELTAMIVIR PHOSPHATE 75 MG PO CAPS: 75.0000 mg | ORAL_CAPSULE | Freq: Two times a day (BID) | ORAL | 0 refills | Status: DC

## 2018-02-14 MED ORDER — AZITHROMYCIN 250 MG PO TABS: ORAL_TABLET | ORAL | 0 refills | Status: DC

## 2018-02-14 MED ORDER — HYDROCODONE-HOMATROPINE 5-1.5 MG/5ML PO SYRP: 5.0000 mL | ORAL_SOLUTION | Freq: Three times a day (TID) | ORAL | 0 refills | Status: DC | PRN

## 2018-02-14 MED ORDER — AZITHROMYCIN 250 MG PO TABS
ORAL_TABLET | ORAL | 0 refills | Status: DC
Start: 2018-02-14 — End: 2018-04-24

## 2018-02-14 MED FILL — HYDROMET SYRUP: 5-1.5 | 7 days supply | Qty: 100 | Fill #0

## 2018-02-14 MED FILL — OSELTAMIVIR PHOSPHATE 75 MG: 75 | 5 days supply | Qty: 10 | Fill #0

## 2018-02-14 MED FILL — FLUTICASONE PROP 50 MCG SPR: 50 | 30 days supply | Qty: 16 | Fill #0

## 2018-02-14 MED FILL — AZITHROMYCIN 250 MG TABLET: 250 | 5 days supply | Qty: 6 | Fill #0

## 2018-02-14 NOTE — Progress Notes (Signed)
Subjective:    Patient ID: Samantha Miranda, female    DOB: 02-Dec-1945, 72 y.o.   MRN: [id number]  HPI  Pt in with symptoms that started Tuesday morning.   She states started coughing with chest congestion. No wheezing. Cough is occasionally productive. Pt states body aches that started yesterday. Has some ear aches. Some sinus pressure.   Has had fever of 102.  Transient 1-2 second chest wall pain on coughing and taking deep breaths.  Pt did get flu vaccine this year.    Review of Systems  Constitutional: Positive for chills, fatigue and fever.  HENT: Positive for congestion, ear pain, sinus pressure, sinus pain and sore throat.   Respiratory: Positive for cough. Negative for chest tightness, shortness of breath and wheezing.   Cardiovascular: Negative for chest pain and palpitations.  Gastrointestinal: Negative for abdominal pain.  Musculoskeletal: Positive for myalgias. Negative for back pain, neck pain and neck stiffness.  Skin: Negative for rash.  Neurological: Negative for dizziness, seizures, speech difficulty, weakness, light-headedness and headaches.  Hematological: Negative for adenopathy. Does not bruise/bleed easily.  Psychiatric/Behavioral: Negative for behavioral problems, confusion, dysphoric mood and suicidal ideas. The patient is not nervous/anxious.     Past Medical History:  Diagnosis Date  . Barrett's esophagus   . Basal cell carcinoma of left lower leg   . Breast cancer ([place])   . Gastric ulcer   . GERD (gastroesophageal reflux disease)   . History of colon polyps   . Hyperlipidemia   . Osteopenia      Social History   Socioeconomic History  . Marital status: Married    Spouse name: Not on file  . Number of children: 1  . Years of education: Not on file  . Highest education level: Not on file  Occupational History  . 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 Financial resource strain: Not on  file  . Food insecurity:    Worry: Not on file    Inability: Not on file  . Transportation needs:    Medical: Not on file    Non-medical: Not on file  Tobacco Use  . Smoking status: Former Smoker    Packs/day: 1.00    Years: 40.00    Pack years: 40.00    Last attempt to quit: 07/2011    Years since quitting: 6.5  . Smokeless tobacco: Never Used  . Tobacco comment: Pt currently using lowest strength vapes.  11/02/2015  Substance and Sexual Activity  . Alcohol use: No    Alcohol/week: 0.0 oz  . Drug use: No  . Sexual activity: Yes  Lifestyle  . Physical activity:    Days per week: Not on file    Minutes per session: Not on file  . Stress: Not on file  Relationships  . Social connections:    Talks on phone: Not on file    Gets together: Not on file    Attends religious service: Not on file    Active member of club or organization: Not on file    Attends meetings of clubs or organizations: Not on file    Relationship status: Not on file  . Intimate partner violence:    Fear of current or ex partner: Not on file    Emotionally abused: Not on file    Physically abused: Not on file    Forced sexual activity: Not on file  Other Topics Concern  .  Not on file  Social History Narrative   Exercise-no    Past Surgical History:  Procedure Laterality Date  . ABDOMINAL HYSTERECTOMY  1991  . BREAST LUMPECTOMY  9/07  . CATARACT EXTRACTION, BILATERAL    . EYE SURGERY  08/2013  10/2013   cataracts    Family History  Problem Relation Age of Onset  . Heart failure Mother        CHF  . Hyperlipidemia Mother   . Lung cancer Father 80  . Breast cancer Sister 52  . Colon cancer Maternal Grandfather   . Diabetes Maternal Grandmother   . Heart attack Neg Hx   . Hypertension Neg Hx   . Sudden death Neg Hx   . Esophageal cancer Neg Hx   . Stomach cancer Neg Hx     Allergies  Allergen Reactions  . Amoxicillin-Pot Clavulanate Nausea And Vomiting  . Moxifloxacin Nausea And  Vomiting    Current Outpatient Medications on File Prior to Visit  Medication Sig Dispense Refill  . Cholecalciferol (VITAMIN D) 2000 UNITS tablet Take 4,000 Units by mouth daily.    . Coenzyme Q10 (COQ10 PO) Take 1 each by mouth daily.    . Collagenase POWD by Does not apply route.    Marland Kitchen FLUoxetine (PROZAC) 10 MG tablet Take 1 tablet (10 mg total) by mouth daily. 90 tablet 3  . hydrocortisone (ANUSOL-HC) 2.5 % rectal cream place 1 APPLICATION rectally twice a day 35 g 2  . MAGNESIUM PO Take 1 each by mouth daily.    . Misc Natural Products (CHOLESTEROL SUPPORT PO) Take by mouth.    . Multiple Vitamins-Minerals (MULTIVITAMIN ADULTS PO) Take 1 tablet by mouth daily.    Marland Kitchen OVER THE COUNTER MEDICATION Take 1 capsule by mouth daily. Cholesterol solution vitamin    . OVER THE COUNTER MEDICATION Take 2 capsules by mouth daily. Lung and bronchial support vitamin    . OVER THE COUNTER MEDICATION Take 2 capsules by mouth daily. Omega Q Plus vitamin    . pantoprazole (PROTONIX) 40 MG tablet Take 1 tablet (40 mg total) by mouth daily before breakfast. 30 tablet 11  . XIIDRA 5 % SOLN instill 1 drop into both eyes every 12 hours  1   No current facility-administered medications on file prior to visit.     BP (!) 146/75 (BP Location: Left Arm, Patient Position: Sitting, Cuff Size: Normal)   Pulse 92   Temp 99.2 F (37.3 C) (Oral)   Resp 18   Ht 5\' 2"  (1.575 m)   Wt 123 lb 6.4 oz (56 kg)   SpO2 99%   BMI 22.57 kg/m       Objective:   Physical Exam  General  Mental Status - Alert. General Appearance - Well groomed. Not in acute distress.  Skin Rashes- No Rashes.  HEENT Head- Normal. Ear Auditory Canal - Left- Normal. Right - Normal.Tympanic Membrane- Left- Normal. Right- Normal. Eye Sclera/Conjunctiva- Left- Normal. Right- Normal. Nose & Sinuses Nasal Mucosa- Left-  Boggy and Congested. Right-  Boggy and  Congested.Bilateral maxillary  No and no  frontal sinus pressure. Mouth &  Throat Lips: Upper Lip- Normal: no dryness, cracking, pallor, cyanosis, or vesicular eruption. Lower Lip-Normal: no dryness, cracking, pallor, cyanosis or vesicular eruption. Buccal Mucosa- Bilateral- No Aphthous ulcers. Oropharynx- No Discharge or Erythema. Tonsils: Characteristics- Bilateral- No Erythema or Congestion. Size/Enlargement- Bilateral- No enlargement. Discharge- bilateral-None.  Neck Neck- Supple. No Masses.   Chest and Lung Exam Auscultation: Breath Sounds:-Clear  even and unlabored.  Cardiovascular Auscultation:Rythm- Regular, rate and rhythm. Murmurs & Other Heart Sounds:Ausculatation of the heart reveal- No Murmurs.  Lymphatic Head & Neck General Head & Neck Lymphatics: Bilateral: Description- No Localized lymphadenopathy.  Anterior chest mild lower costochondral junction tenderness to palpation bilaterally.-        Assessment & Plan:  Your flu test was positive for type A.  In addition based on recent signs and symptoms I do think that she have early secondary infection/bronchitis.  Please start Tamiflu today and also sent in a azithromycin antibiotic.  For nasal congestion, I prescribed Flonase.  For your severe intermittent cough, I prescribed Hycodan.  For body aches or fever can use Tylenol or ibuprofen.  Note you do have some transient sharp chest wall pain that is consistent with costochondritis.  Ibuprofen would probably help more than Tylenol.  If your chest pain features change becoming constant and pressure-like or other associated cardiac type symptoms then be seen in the emergency department.  Please make sure you stay well-hydrated.  You should progressively get better but if you have severe changing/worsening signs and symptoms then be seen here before the weekend or if after hours then ED evaluation.(Rare but sometimes severe complications with fluid can occur.)  Follow-up in 7-10 days or as needed.  Mackie Pai, PA-C

## 2018-02-14 NOTE — Patient Instructions (Signed)
Your flu test was positive for type A.  In addition based on recent signs and symptoms I do think that she have early secondary infection/bronchitis.  Please start Tamiflu today and also sent in a azithromycin antibiotic.  For nasal congestion, I prescribed Flonase.  For your severe intermittent cough, I prescribed Hycodan.  For body aches or fever can use Tylenol or ibuprofen.  Note you do have some transient sharp chest wall pain that is consistent with costochondritis.  Ibuprofen would probably help more than Tylenol.  If your chest pain features change becoming constant and pressure-like or other associated cardiac type symptoms then be seen in the emergency department.  Please make sure you stay well-hydrated.  You should progressively get better but if you have severe changing/worsening signs and symptoms then be seen here before the weekend or if after hours then ED evaluation.(Rare but sometimes severe complications with fluid can occur.)  Follow-up in 7-10 days or as needed.

## 2018-03-26 LAB — HM MAMMOGRAPHY

## 2018-04-06 ENCOUNTER — Encounter: Payer: Self-pay | Admitting: Family Medicine

## 2018-04-24 ENCOUNTER — Encounter: Payer: Self-pay | Admitting: Family Medicine

## 2018-04-24 ENCOUNTER — Ambulatory Visit (INDEPENDENT_AMBULATORY_CARE_PROVIDER_SITE_OTHER): Payer: Medicare Other | Admitting: Family Medicine

## 2018-04-24 VITALS — BP 152/78 | HR 66 | Temp 97.9°F | Resp 16 | Ht 63.0 in | Wt 117.4 lb

## 2018-04-24 DIAGNOSIS — E785 Hyperlipidemia, unspecified: Secondary | ICD-10-CM | POA: Diagnosis not present

## 2018-04-24 DIAGNOSIS — Z0000 Encounter for general adult medical examination without abnormal findings: Secondary | ICD-10-CM

## 2018-04-24 LAB — COMPREHENSIVE METABOLIC PANEL
ALK PHOS: 50 U/L (ref 39–117)
ALT: 16 U/L (ref 0–35)
AST: 24 U/L (ref 0–37)
Albumin: 4.4 g/dL (ref 3.5–5.2)
BILIRUBIN TOTAL: 0.5 mg/dL (ref 0.2–1.2)
BUN: 16 mg/dL (ref 6–23)
CO2: 31 meq/L (ref 19–32)
CREATININE: 0.8 mg/dL (ref 0.40–1.20)
Calcium: 9.6 mg/dL (ref 8.4–10.5)
Chloride: 103 mEq/L (ref 96–112)
GFR: 75.13 mL/min (ref >60.00)
GLUCOSE: 87 mg/dL (ref 70–99)
Potassium: 3.9 mEq/L (ref 3.5–5.1)
SODIUM: 142 meq/L (ref 135–145)
TOTAL PROTEIN: 7.1 g/dL (ref 6.0–8.3)

## 2018-04-24 LAB — LIPID PANEL
Cholesterol: 297 mg/dL — ABNORMAL HIGH (ref 0–200)
HDL: 59.3 mg/dL (ref >39.00)
LDL CALC: 216 mg/dL — AB (ref 0–99)
NonHDL: 237.71
Total CHOL/HDL Ratio: 5
Triglycerides: 110 mg/dL (ref 0.0–149.0)
VLDL: 22 mg/dL (ref 0.0–40.0)

## 2018-04-24 NOTE — Progress Notes (Signed)
Subjective:     Samantha Miranda is a 72 y.o. female and is here for a comprehensive physical exam. The patient reports no problems.  Social History   Socioeconomic History  . Marital status: Married    Spouse name: Not on file  . Number of children: 1  . Years of education: Not on file  . Highest education level: Not on file  Occupational History  . 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 Financial resource strain: Not on file  . Food insecurity:    Worry: Not on file    Inability: Not on file  . Transportation needs:    Medical: Not on file    Non-medical: Not on file  Tobacco Use  . Smoking status: Former Smoker    Packs/day: 1.00    Years: 40.00    Pack years: 40.00    Last attempt to quit: 07/2011    Years since quitting: 6.7  . Smokeless tobacco: Never Used  . Tobacco comment: Pt currently using lowest strength vapes.  11/02/2015  Substance and Sexual Activity  . Alcohol use: No    Alcohol/week: 0.0 oz  . Drug use: No  . Sexual activity: Yes  Lifestyle  . Physical activity:    Days per week: Not on file    Minutes per session: Not on file  . Stress: Not on file  Relationships  . Social connections:    Talks on phone: Not on file    Gets together: Not on file    Attends religious service: Not on file    Active member of club or organization: Not on file    Attends meetings of clubs or organizations: Not on file    Relationship status: Not on file  . Intimate partner violence:    Fear of current or ex partner: Not on file    Emotionally abused: Not on file    Physically abused: Not on file    Forced sexual activity: Not on file  Other Topics Concern  . Not on file  Social History Narrative   Exercise-no   Health Maintenance  Topic Date Due  . INFLUENZA VACCINE  06/21/2018  . MAMMOGRAM  03/27/2019  . TETANUS/TDAP  08/26/2020  . COLONOSCOPY  10/23/2021  . Hepatitis C Screening  Completed  . PNA  vac Low Risk Adult  Completed  . DEXA SCAN  Addressed    The following portions of the patient's history were reviewed and updated as appropriate:  She  has a past medical history of Barrett's esophagus, Basal cell carcinoma of left lower leg, Breast cancer ([place]), Gastric ulcer, GERD (gastroesophageal reflux disease), History of colon polyps, Hyperlipidemia, and Osteopenia. She does not have any pertinent problems on file. She  has a past surgical history that includes Abdominal hysterectomy (1991); Breast lumpectomy (9/07); Cataract extraction, bilateral; and Eye surgery (08/2013  10/2013). Her family history includes Breast cancer (age of onset: 84) in her sister; Colon cancer in her maternal grandfather; Diabetes in her maternal grandmother; Heart failure in her mother; Hyperlipidemia in her mother; Lung cancer (age of onset: 25) in her father. She  reports that she quit smoking about 6 years ago. She has a 40.00 pack-year smoking history. She has never used smokeless tobacco. She reports that she does not drink alcohol or use drugs. She has a current medication list which includes the following prescription(s): vitamin d, coenzyme q10, collagenase, fluoxetine,  fluticasone, hydrocortisone, magnesium, misc natural products, multiple vitamins-minerals, OVER THE COUNTER MEDICATION, OVER THE COUNTER MEDICATION, OVER THE COUNTER MEDICATION, pantoprazole, and xiidra. Current Outpatient Medications on File Prior to Visit  Medication Sig Dispense Refill  . Cholecalciferol (VITAMIN D) 2000 UNITS tablet Take 4,000 Units by mouth daily.    . Coenzyme Q10 (COQ10 PO) Take 1 each by mouth daily.    . Collagenase POWD by Does not apply route.    Marland Kitchen FLUoxetine (PROZAC) 10 MG tablet Take 1 tablet (10 mg total) by mouth daily. 90 tablet 3  . fluticasone (FLONASE) 50 MCG/ACT nasal spray Place 2 sprays into both nostrils daily. 16 g 1  . hydrocortisone (ANUSOL-HC) 2.5 % rectal cream place 1 APPLICATION rectally  twice a day 35 g 2  . MAGNESIUM PO Take 1 each by mouth daily.    . Misc Natural Products (CHOLESTEROL SUPPORT PO) Take by mouth.    . Multiple Vitamins-Minerals (MULTIVITAMIN ADULTS PO) Take 1 tablet by mouth daily.    Marland Kitchen OVER THE COUNTER MEDICATION Take 1 capsule by mouth daily. Cholesterol solution vitamin    . OVER THE COUNTER MEDICATION Take 2 capsules by mouth daily. Lung and bronchial support vitamin    . OVER THE COUNTER MEDICATION Take 2 capsules by mouth daily. Omega Q Plus vitamin    . pantoprazole (PROTONIX) 40 MG tablet Take 1 tablet (40 mg total) by mouth daily before breakfast. 30 tablet 11  . XIIDRA 5 % SOLN instill 1 drop into both eyes every 12 hours  1   No current facility-administered medications on file prior to visit.    She is allergic to amoxicillin-pot clavulanate and moxifloxacin..  Review of Systems Review of Systems  Constitutional: Negative for activity change, appetite change and fatigue.  HENT: Negative for hearing loss, congestion, tinnitus and ear discharge.  dentist q23m Eyes: Negative for visual disturbance (see optho q1y -- vision corrected to 20/20 with glasses).  Respiratory: Negative for cough, chest tightness and shortness of breath.   Cardiovascular: Negative for chest pain, palpitations and leg swelling.  Gastrointestinal: Negative for abdominal pain, diarrhea, constipation and abdominal distention.  Genitourinary: Negative for urgency, frequency, decreased urine volume and difficulty urinating.  Musculoskeletal: Negative for back pain, arthralgias and gait problem.  Skin: Negative for color change, pallor and rash.  Neurological: Negative for dizziness, light-headedness, numbness and headaches.  Hematological: Negative for adenopathy. Does not bruise/bleed easily.  Psychiatric/Behavioral: Negative for suicidal ideas, confusion, sleep disturbance, self-injury, dysphoric mood, decreased concentration and agitation.       Objective:    BP (!)  152/78 (BP Location: Right Arm, Cuff Size: Normal)   Pulse 66   Temp 97.9 F (36.6 C) (Oral)   Resp 16   Ht 5\' 3"  (1.6 m)   Wt 117 lb 6.4 oz (53.3 kg)   SpO2 97%   BMI 20.80 kg/m  General appearance: alert, cooperative, appears stated age and no distress Head: Normocephalic, without obvious abnormality, atraumatic Eyes: conjunctivae/corneas clear. PERRL, EOM's intact. Fundi benign. Ears: normal TM's and external ear canals both ears Nose: Nares normal. Septum midline. Mucosa normal. No drainage or sinus tenderness. Throat: lips, mucosa, and tongue normal; teeth and gums normal Neck: no adenopathy, no carotid bruit, no JVD, supple, symmetrical, trachea midline and thyroid not enlarged, symmetric, no tenderness/mass/nodules Back: symmetric, no curvature. ROM normal. No CVA tenderness. Lungs: clear to auscultation bilaterally Breasts: gyn Heart: regular rate and rhythm, S1, S2 normal, no murmur, click, rub or gallop Abdomen: soft,  non-tender; bowel sounds normal; no masses,  no organomegaly Pelvic: deferred--gyn Extremities: extremities normal, atraumatic, no cyanosis or edema Pulses: 2+ and symmetric Skin: Skin color, texture, turgor normal. No rashes or lesions Lymph nodes: Cervical, supraclavicular, and axillary nodes normal. Neurologic: Alert and oriented X 3, normal strength and tone. Normal symmetric reflexes. Normal coordination and gait    Assessment:    Healthy female exam.     Plan:    ghm utd Check labs  See After Visit Summary for Counseling Recommendations    1. Preventative health care See above  - Comprehensive metabolic panel - Lipid panel  2. Hyperlipidemia, unspecified hyperlipidemia type Encouraged heart healthy diet, increase exercise, avoid trans fats, consider a krill oil cap daily - Comprehensive metabolic panel - Lipid panel

## 2018-04-24 NOTE — Patient Instructions (Signed)
Preventive Care 65 Years and Older, Female Preventive care refers to lifestyle choices and visits with your health care provider that can promote health and wellness. What does preventive care include?  A yearly physical exam. This is also called an annual well check.  Dental exams once or twice a year.  Routine eye exams. Ask your health care provider how often you should have your eyes checked.  Personal lifestyle choices, including: ? Daily care of your teeth and gums. ? Regular physical activity. ? Eating a healthy diet. ? Avoiding tobacco and drug use. ? Limiting alcohol use. ? Practicing safe sex. ? Taking low-dose aspirin every day. ? Taking vitamin and mineral supplements as recommended by your health care provider. What happens during an annual well check? The services and screenings done by your health care provider during your annual well check will depend on your age, overall health, lifestyle risk factors, and family history of disease. Counseling Your health care provider may ask you questions about your:  Alcohol use.  Tobacco use.  Drug use.  Emotional well-being.  Home and relationship well-being.  Sexual activity.  Eating habits.  History of falls.  Memory and ability to understand (cognition).  Work and work environment.  Reproductive health.  Screening You may have the following tests or measurements:  Height, weight, and BMI.  Blood pressure.  Lipid and cholesterol levels. These may be checked every 5 years, or more frequently if you are over 50 years old.  Skin check.  Lung cancer screening. You may have this screening every year starting at age 55 if you have a 30-pack-year history of smoking and currently smoke or have quit within the past 15 years.  Fecal occult blood test (FOBT) of the stool. You may have this test every year starting at age 50.  Flexible sigmoidoscopy or colonoscopy. You may have a sigmoidoscopy every 5 years or  a colonoscopy every 10 years starting at age 50.  Hepatitis C blood test.  Hepatitis B blood test.  Sexually transmitted disease (STD) testing.  Diabetes screening. This is done by checking your blood sugar (glucose) after you have not eaten for a while (fasting). You may have this done every 1-3 years.  Bone density scan. This is done to screen for osteoporosis. You may have this done starting at age 65.  Mammogram. This may be done every 1-2 years. Talk to your health care provider about how often you should have regular mammograms.  Talk with your health care provider about your test results, treatment options, and if necessary, the need for more tests. Vaccines Your health care provider may recommend certain vaccines, such as:  Influenza vaccine. This is recommended every year.  Tetanus, diphtheria, and acellular pertussis (Tdap, Td) vaccine. You may need a Td booster every 10 years.  Varicella vaccine. You may need this if you have not been vaccinated.  Zoster vaccine. You may need this after age 60.  Measles, mumps, and rubella (MMR) vaccine. You may need at least one dose of MMR if you were born in 1957 or later. You may also need a second dose.  Pneumococcal 13-valent conjugate (PCV13) vaccine. One dose is recommended after age 65.  Pneumococcal polysaccharide (PPSV23) vaccine. One dose is recommended after age 65.  Meningococcal vaccine. You may need this if you have certain conditions.  Hepatitis A vaccine. You may need this if you have certain conditions or if you travel or work in places where you may be exposed to hepatitis   A.  Hepatitis B vaccine. You may need this if you have certain conditions or if you travel or work in places where you may be exposed to hepatitis B.  Haemophilus influenzae type b (Hib) vaccine. You may need this if you have certain conditions.  Talk to your health care provider about which screenings and vaccines you need and how often you  need them. This information is not intended to replace advice given to you by your health care provider. Make sure you discuss any questions you have with your health care provider. Document Released: 12/04/2015 Document Revised: 07/27/2016 Document Reviewed: 09/08/2015 Elsevier Interactive Patient Education  2018 Elsevier Inc.  

## 2018-04-30 ENCOUNTER — Other Ambulatory Visit: Payer: Self-pay | Admitting: Family Medicine

## 2018-04-30 DIAGNOSIS — E7849 Other hyperlipidemia: Secondary | ICD-10-CM

## 2018-04-30 MED ORDER — ROSUVASTATIN CALCIUM 10 MG PO TABS: 10.0000 mg | ORAL_TABLET | Freq: Every day | ORAL | 2 refills | Status: DC

## 2018-05-02 ENCOUNTER — Encounter: Payer: Self-pay | Admitting: Family Medicine

## 2018-05-03 ENCOUNTER — Other Ambulatory Visit: Payer: Self-pay | Admitting: Family Medicine

## 2018-05-03 ENCOUNTER — Other Ambulatory Visit: Payer: Self-pay | Admitting: *Deleted

## 2018-05-03 ENCOUNTER — Other Ambulatory Visit (INDEPENDENT_AMBULATORY_CARE_PROVIDER_SITE_OTHER): Payer: Medicare Other

## 2018-05-03 DIAGNOSIS — R300 Dysuria: Secondary | ICD-10-CM

## 2018-05-03 DIAGNOSIS — N390 Urinary tract infection, site not specified: Secondary | ICD-10-CM

## 2018-05-03 LAB — POC URINALSYSI DIPSTICK (AUTOMATED)
Bilirubin, UA: NEGATIVE
Glucose, UA: NEGATIVE
Ketones, UA: NEGATIVE
Nitrite, UA: NEGATIVE
PROTEIN UA: NEGATIVE
UROBILINOGEN UA: 0.2 U/dL
pH, UA: 6 (ref 5.0–8.0)

## 2018-05-03 MED ORDER — NITROFURANTOIN MONOHYD MACRO 100 MG PO CAPS: 100.0000 mg | ORAL_CAPSULE | Freq: Two times a day (BID) | ORAL | 0 refills | Status: DC

## 2018-05-03 NOTE — Telephone Encounter (Signed)
Looks like she will come in is lab only ok.

## 2018-05-03 NOTE — Telephone Encounter (Signed)
Can you review labs?

## 2018-05-03 NOTE — Telephone Encounter (Signed)
Not in my in basket---  I thought I already sent it to you

## 2018-05-03 NOTE — Telephone Encounter (Signed)
Yes she can come in

## 2018-05-05 ENCOUNTER — Encounter: Payer: Self-pay | Admitting: Family Medicine

## 2018-05-05 LAB — URINE CULTURE
MICRO NUMBER:: 90710419
SPECIMEN QUALITY: ADEQUATE

## 2018-05-15 ENCOUNTER — Ambulatory Visit (INDEPENDENT_AMBULATORY_CARE_PROVIDER_SITE_OTHER)
Admission: RE | Admit: 2018-05-15 | Discharge: 2018-05-15 | Disposition: A | Discharge status: Home / Self Care | Payer: Medicare Other | Source: Ambulatory Visit | Attending: Acute Care | Admitting: Acute Care

## 2018-05-15 DIAGNOSIS — Z87891 Personal history of nicotine dependence: Secondary | ICD-10-CM

## 2018-05-21 ENCOUNTER — Other Ambulatory Visit: Payer: Self-pay | Admitting: Acute Care

## 2018-05-21 DIAGNOSIS — Z122 Encounter for screening for malignant neoplasm of respiratory organs: Secondary | ICD-10-CM

## 2018-05-21 DIAGNOSIS — Z87891 Personal history of nicotine dependence: Secondary | ICD-10-CM

## 2018-05-23 ENCOUNTER — Encounter: Payer: Self-pay | Admitting: Family Medicine

## 2018-05-28 ENCOUNTER — Encounter: Payer: Self-pay | Admitting: Family Medicine

## 2018-05-28 NOTE — Telephone Encounter (Signed)
We can try vytorin 10/10 #30  1 po qhs,  2 refills  Recheck labs 3 months

## 2018-05-29 MED ORDER — EZETIMIBE 10 MG PO TABS: 10.0000 mg | ORAL_TABLET | Freq: Every day | ORAL | 2 refills | Status: DC

## 2018-05-29 NOTE — Telephone Encounter (Signed)
vytorin is a better choice because it is zetia and zocor together We can try zetia ---  10 mg -- there is only one dose  #30  1 po qd,  2 refills

## 2018-05-29 NOTE — Telephone Encounter (Signed)
Patient sent in another mychart message and would like to use Zetia over Vytorin.  Are you ok with that?  If so what mg?

## 2018-05-29 NOTE — Telephone Encounter (Signed)
See other mychart message.

## 2018-06-07 ENCOUNTER — Encounter: Payer: Medicare Other | Admitting: Family Medicine

## 2018-06-21 ENCOUNTER — Ambulatory Visit: Payer: Medicare Other | Admitting: Family Medicine

## 2018-06-21 ENCOUNTER — Encounter: Payer: Self-pay | Admitting: Family Medicine

## 2018-06-21 VITALS — BP 127/67 | HR 80 | Temp 98.6°F | Ht 63.0 in | Wt 118.2 lb

## 2018-06-21 DIAGNOSIS — J029 Acute pharyngitis, unspecified: Secondary | ICD-10-CM | POA: Diagnosis not present

## 2018-06-21 DIAGNOSIS — J40 Bronchitis, not specified as acute or chronic: Secondary | ICD-10-CM

## 2018-06-21 LAB — POCT RAPID STREP A (OFFICE): RAPID STREP A SCREEN: NEGATIVE

## 2018-06-21 MED ORDER — AZITHROMYCIN 250 MG PO TABS: ORAL_TABLET | ORAL | 0 refills | Status: DC

## 2018-06-21 MED ORDER — HYDROCODONE-HOMATROPINE 5-1.5 MG/5ML PO SYRP: 5.0000 mL | ORAL_SOLUTION | Freq: Three times a day (TID) | ORAL | 0 refills | Status: DC | PRN

## 2018-06-21 MED FILL — HYDROCODONE-HOMATROPINE SOL: 5-1.5 | 7 days supply | Qty: 100 | Fill #0

## 2018-06-21 MED FILL — AZITHROMYCIN 250 MG TABLET: 250 | 5 days supply | Qty: 6 | Fill #0

## 2018-06-21 NOTE — Patient Instructions (Signed)

## 2018-06-21 NOTE — Progress Notes (Signed)
Patient ID: Samantha Miranda, female    DOB: Nov 19, 1946  Age: 72 y.o. MRN: [id number]    Subjective:  Subjective  HPI Samantha Miranda presents for cough and fever x 1 week.   Cough was productive she took rx cough med from last time and ibuprofen and throat spray, vicks   Review of Systems  Constitutional: Positive for chills. Negative for fever.  HENT: Positive for congestion, postnasal drip, rhinorrhea and sore throat. Negative for sinus pressure.   Respiratory: Positive for cough and chest tightness. Negative for shortness of breath and wheezing.   Cardiovascular: Negative for chest pain, palpitations and leg swelling.  Allergic/Immunologic: Negative for environmental allergies.    History Past Medical History:  Diagnosis Date  . Barrett's esophagus   . Basal cell carcinoma of left lower leg   . Breast cancer ([place])   . Gastric ulcer   . GERD (gastroesophageal reflux disease)   . History of colon polyps   . Hyperlipidemia   . Osteopenia     She has a past surgical history that includes Abdominal hysterectomy (1991); Breast lumpectomy (9/07); Cataract extraction, bilateral; and Eye surgery (08/2013  10/2013).   Her family history includes Breast cancer (age of onset: 24) in her sister; Colon cancer in her maternal grandfather; Diabetes in her maternal grandmother; Heart failure in her mother; Hyperlipidemia in her mother; Lung cancer (age of onset: 81) in her father.She reports that she quit smoking about 6 years ago. She has a 40.00 pack-year smoking history. She has never used smokeless tobacco. She reports that she does not drink alcohol or use drugs.  Current Outpatient Medications on File Prior to Visit  Medication Sig Dispense Refill  . Cholecalciferol (VITAMIN D) 2000 UNITS tablet Take 4,000 Units by mouth daily.    . Coenzyme Q10 (COQ10 PO) Take 1 each by mouth daily.    . Collagenase POWD by Does not apply route.    . ezetimibe (ZETIA) 10 MG tablet Take 1 tablet (10 mg  total) by mouth daily. 30 tablet 2  . FLUoxetine (PROZAC) 10 MG tablet Take 1 tablet (10 mg total) by mouth daily. 90 tablet 3  . fluticasone (FLONASE) 50 MCG/ACT nasal spray Place 2 sprays into both nostrils daily. 16 g 1  . hydrocortisone (ANUSOL-HC) 2.5 % rectal cream place 1 APPLICATION rectally twice a day 35 g 2  . MAGNESIUM PO Take 1 each by mouth daily.    . Misc Natural Products (CHOLESTEROL SUPPORT PO) Take by mouth.    . Multiple Vitamins-Minerals (MULTIVITAMIN ADULTS PO) Take 1 tablet by mouth daily.    Marland Kitchen OVER THE COUNTER MEDICATION Take 1 capsule by mouth daily. Cholesterol solution vitamin    . OVER THE COUNTER MEDICATION Take 2 capsules by mouth daily. Lung and bronchial support vitamin    . OVER THE COUNTER MEDICATION Take 2 capsules by mouth daily. Omega Q Plus vitamin    . pantoprazole (PROTONIX) 40 MG tablet Take 1 tablet (40 mg total) by mouth daily before breakfast. 30 tablet 11  . XIIDRA 5 % SOLN instill 1 drop into both eyes every 12 hours  1   No current facility-administered medications on file prior to visit.      Objective:  Objective  Physical Exam  Constitutional: She is oriented to person, place, and time. She appears well-developed and well-nourished.  HENT:  Right Ear: External ear normal.  Left Ear: External ear normal.  + PND + errythema  Eyes: Conjunctivae are normal.  Right eye exhibits no discharge. Left eye exhibits no discharge.  Cardiovascular: Normal rate, regular rhythm and normal heart sounds.  No murmur heard. Pulmonary/Chest: Effort normal and breath sounds normal. No respiratory distress. She has no wheezes. She has no rales. She exhibits no tenderness.  Musculoskeletal: She exhibits no edema.  Lymphadenopathy:    She has cervical adenopathy.  Neurological: She is alert and oriented to person, place, and time.  Nursing note and vitals reviewed.  BP 127/67 (BP Location: Left Arm, Patient Position: Sitting, Cuff Size: Normal)   Pulse 80    Temp 98.6 F (37 C) (Oral)   Ht 5\' 3"  (1.6 m)   Wt 118 lb 3.2 oz (53.6 kg)   SpO2 97%   BMI 20.94 kg/m  Wt Readings from Last 3 Encounters:  06/21/18 118 lb 3.2 oz (53.6 kg)  04/24/18 117 lb 6.4 oz (53.3 kg)  02/14/18 123 lb 6.4 oz (56 kg)     Lab Results  Component Value Date   WBC 5.7 08/04/2017   HGB 13.0 08/04/2017   HCT 38.5 08/04/2017   PLT 210 08/04/2017   GLUCOSE 87 04/24/2018   CHOL 297 (H) 04/24/2018   TRIG 110.0 04/24/2018   HDL 59.30 04/24/2018   LDLDIRECT 176.2 08/12/2013   LDLCALC 216 (H) 04/24/2018   ALT 16 04/24/2018   AST 24 04/24/2018   NA 142 04/24/2018   K 3.9 04/24/2018   CL 103 04/24/2018   CREATININE 0.80 04/24/2018   BUN 16 04/24/2018   CO2 31 04/24/2018   TSH 1.07 08/04/2017   HGBA1C 5.1 08/04/2017   MICROALBUR 1.1 08/12/2013    Ct Chest Lung Ca Screen Low Dose W/o Cm  Result Date: 05/16/2018 CLINICAL DATA:  72 year old female former smoker (quit in October 2016) with 39 pack year history of smoking. Lung cancer screening examination. EXAM: CT CHEST WITHOUT CONTRAST LOW-DOSE FOR LUNG CANCER SCREENING TECHNIQUE: Multidetector CT imaging of the chest was performed following the standard protocol without IV contrast. COMPARISON:  Low-dose lung cancer screening chest CT 05/04/2017. FINDINGS: Cardiovascular: Heart size is normal. There is no significant pericardial fluid, thickening or pericardial calcification. There is aortic atherosclerosis, as well as atherosclerosis of the great vessels of the mediastinum and the coronary arteries, including calcified atherosclerotic plaque in the left anterior descending coronary artery. Calcifications of the mitral annulus. Mediastinum/Nodes: No pathologically enlarged mediastinal or hilar lymph nodes. Please note that accurate exclusion of hilar adenopathy is limited on noncontrast CT scans. Esophagus is unremarkable in appearance. No axillary lymphadenopathy. Lungs/Pleura: Multiple small pulmonary nodules are  noted throughout both lungs, the largest of which is in the posterior aspect of the left upper lobe abutting the major fissure (axial image 131 of series 3), with a volume derived mean diameter of 4.4 mm. No other larger more suspicious appearing pulmonary nodules or masses are noted. No acute consolidative airspace disease. No pleural effusions. Mild diffuse bronchial wall thickening with mild centrilobular and paraseptal emphysema. Upper Abdomen: Aortic atherosclerosis. Musculoskeletal: There are no aggressive appearing lytic or blastic lesions noted in the visualized portions of the skeleton. IMPRESSION: 1. Lung-RADS 2S, benign appearance or behavior. Continue annual screening with low-dose chest CT without contrast in 12 months. 2. The "S" modifier above refers to potentially clinically significant non lung cancer related findings. Specifically, there is aortic atherosclerosis, in addition to left anterior descending coronary artery disease. Assessment for potential risk factor modification, dietary therapy or pharmacologic therapy may be warranted, if clinically indicated. 3. Mild diffuse bronchial  wall thickening with mild centrilobular and paraseptal emphysema; imaging findings suggestive of underlying COPD. Aortic Atherosclerosis (ICD10-I70.0) and Emphysema (ICD10-J43.9). Electronically Signed   By: Vinnie Langton M.D.   On: 05/16/2018 12:05     Assessment & Plan:  Plan  I have discontinued Avriana L. Piacente's rosuvastatin and nitrofurantoin (macrocrystal-monohydrate). I am also having her start on azithromycin. Additionally, I am having her maintain her Vitamin D, MAGNESIUM PO, Coenzyme Q10 (COQ10 PO), Multiple Vitamins-Minerals (MULTIVITAMIN ADULTS PO), Collagenase, Misc Natural Products (CHOLESTEROL SUPPORT PO), XIIDRA, OVER THE COUNTER MEDICATION, OVER THE COUNTER MEDICATION, OVER THE COUNTER MEDICATION, hydrocortisone, FLUoxetine, pantoprazole, fluticasone, ezetimibe, HYDROcodone-homatropine, and  HYDROcodone-homatropine.  Meds ordered this encounter  Medications  . azithromycin (ZITHROMAX Z-PAK) 250 MG tablet    Sig: As directed    Dispense:  6 each    Refill:  0  . HYDROcodone-homatropine (HYCODAN) 5-1.5 MG/5ML syrup    Sig: Take 5 mLs by mouth every 8 (eight) hours as needed.    Dispense:  100 mL    Refill:  0  . HYDROcodone-homatropine (HYCODAN) 5-1.5 MG/5ML syrup    Sig: Take 5 mLs by mouth every 8 (eight) hours as needed.    Dispense:  100 mL    Refill:  0    Problem List Items Addressed This Visit    None    Visit Diagnoses    Bronchitis    -  Primary   Relevant Medications   azithromycin (ZITHROMAX Z-PAK) 250 MG tablet   HYDROcodone-homatropine (HYCODAN) 5-1.5 MG/5ML syrup   HYDROcodone-homatropine (HYCODAN) 5-1.5 MG/5ML syrup   Sore throat       Relevant Orders   POCT rapid strep A (Completed)    use xyazal  Cough med per orders for pm-- use mucinex in day rto prn   Follow-up: Return if symptoms worsen or fail to improve.  Ann Held, DO

## 2018-07-25 ENCOUNTER — Other Ambulatory Visit: Payer: Self-pay | Admitting: Family Medicine

## 2018-07-25 DIAGNOSIS — F324 Major depressive disorder, single episode, in partial remission: Secondary | ICD-10-CM

## 2018-07-31 ENCOUNTER — Other Ambulatory Visit: Payer: Medicare Other

## 2018-08-28 ENCOUNTER — Other Ambulatory Visit: Payer: Self-pay | Admitting: Family Medicine

## 2019-01-02 ENCOUNTER — Encounter: Payer: Self-pay | Admitting: Family Medicine

## 2019-01-02 DIAGNOSIS — K648 Other hemorrhoids: Secondary | ICD-10-CM

## 2019-01-02 MED ORDER — PANTOPRAZOLE SODIUM 40 MG PO TBEC: 40.0000 mg | DELAYED_RELEASE_TABLET | Freq: Every day | ORAL | 3 refills | Status: DC

## 2019-01-02 MED ORDER — HYDROCORTISONE 2.5 % RE CREA: TOPICAL_CREAM | RECTAL | 2 refills | Status: DC

## 2019-01-02 NOTE — Addendum Note (Signed)
Addended byDamita Dunnings D on: 01/02/2019 02:50 PM   Modules accepted: Orders

## 2019-03-08 ENCOUNTER — Encounter: Payer: Self-pay | Admitting: *Deleted

## 2019-03-08 ENCOUNTER — Other Ambulatory Visit: Payer: Self-pay | Admitting: Family Medicine

## 2019-03-22 IMAGING — CT CT CHEST LUNG CANCER SCREENING LOW DOSE W/O CM
1 of 4 series · 10 of 40 positions shown, 13 images · non-contrast
Comparison: Low-dose lung cancer screening chest CT 05/03/2016.

CLINICAL DATA: 71-year-old female with history of scratches
71-year-old female former smoker (quit in [id number]) with 39 pack year
history of smoking. Lung cancer screening examination.

EXAM:
CT CHEST WITHOUT CONTRAST LOW-DOSE FOR LUNG CANCER SCREENING
TECHNIQUE: Multidetector CT imaging of the chest was performed following the
standard protocol without IV contrast.

[ct lung segmentation data · axial · 0.62mm/px · z∈[-330,-330]mm · 10 of 308 frames shown]
[frame 1/308  mediastinal]
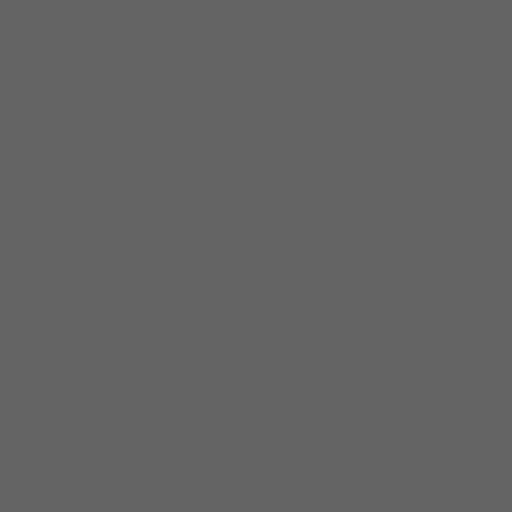
[frame 1/308  lung]
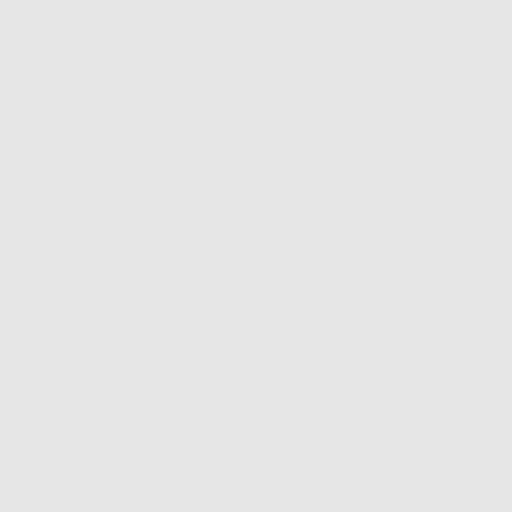
[frame 35/308  lung]
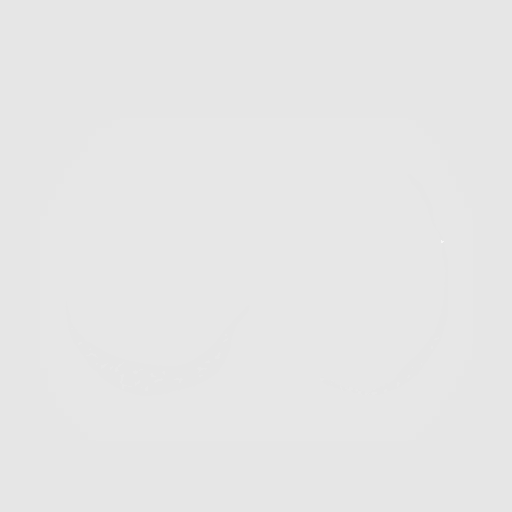
[frame 69/308  lung]
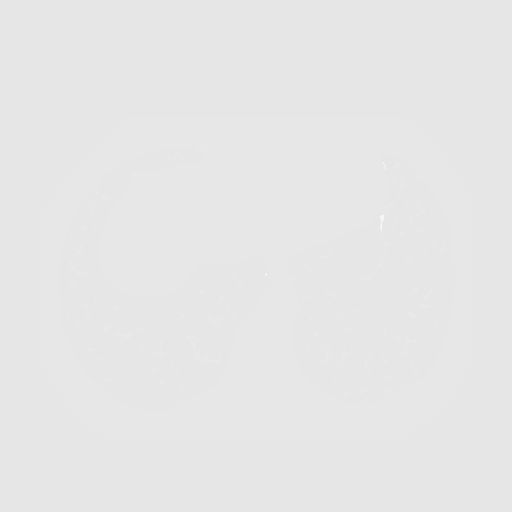
[frame 103/308  lung]
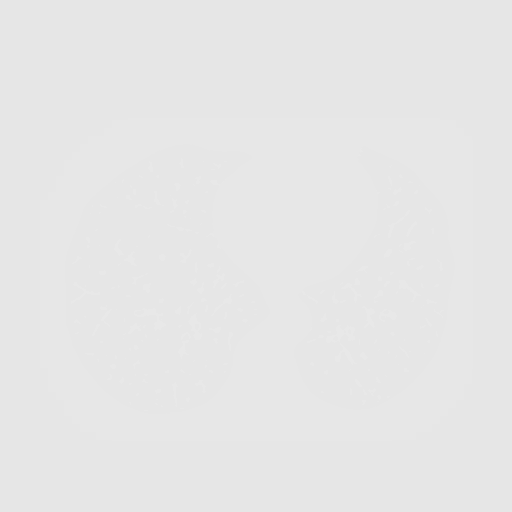
[frame 137/308  mediastinal]
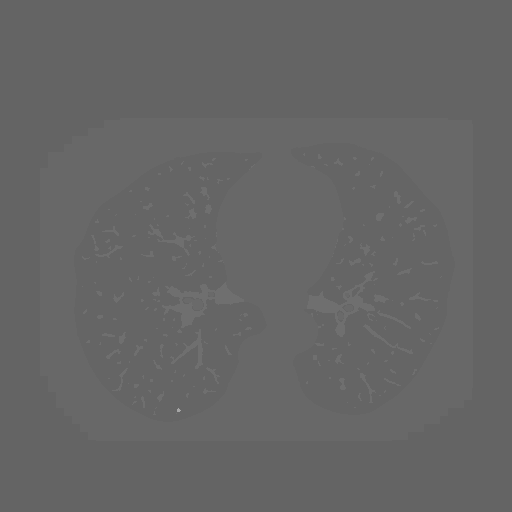
[frame 137/308  lung]
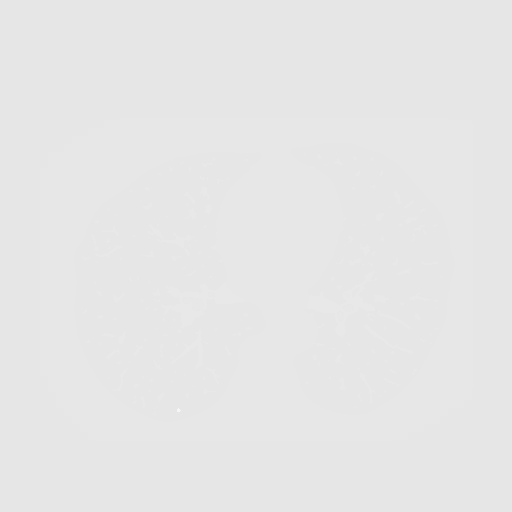
[frame 171/308  lung]
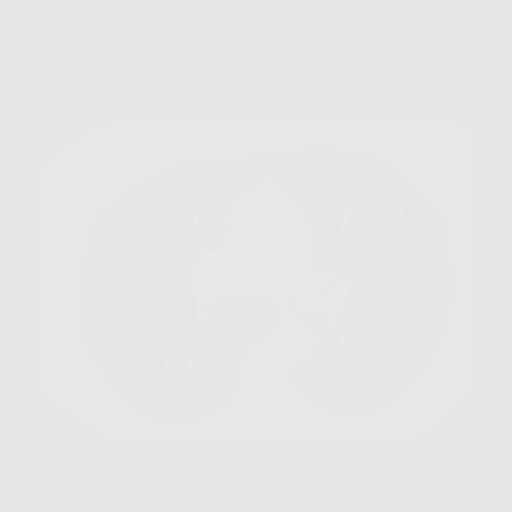
[frame 205/308  lung]
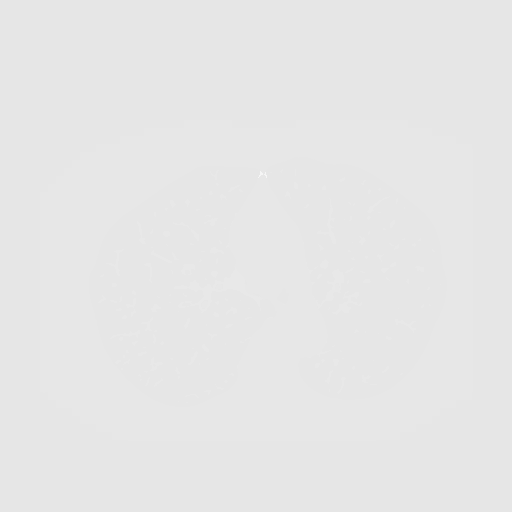
[frame 239/308  lung]
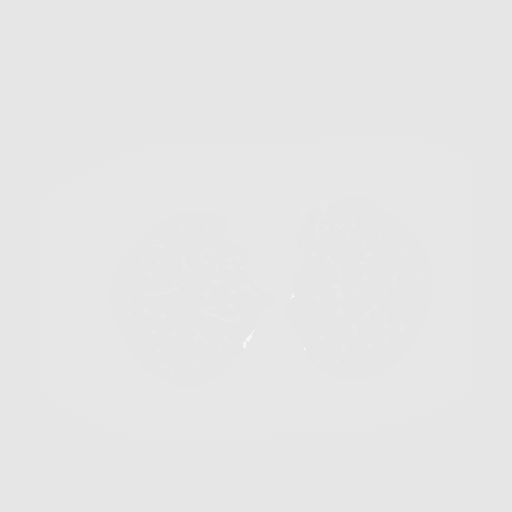
[frame 273/308  mediastinal]
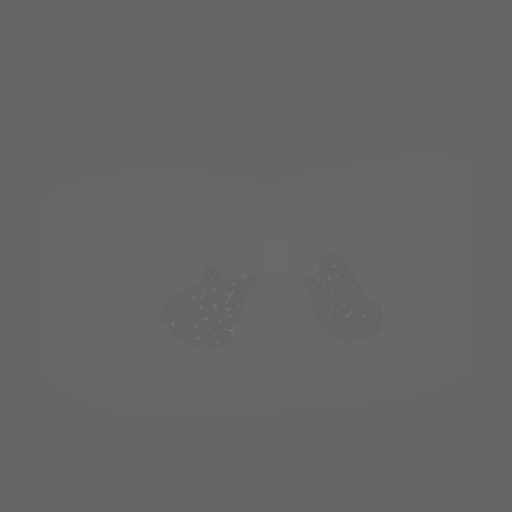
[frame 273/308  lung]
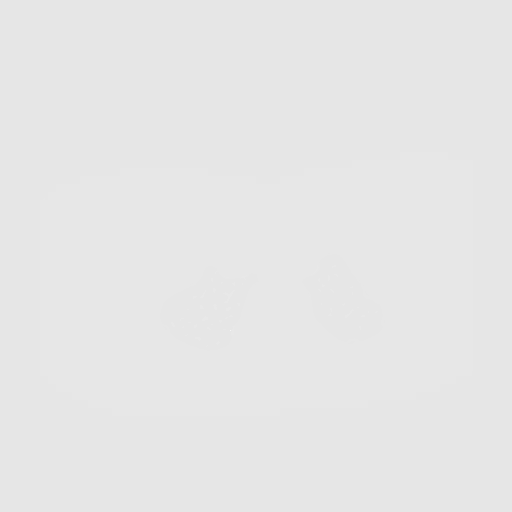
[frame 308/308  lung]
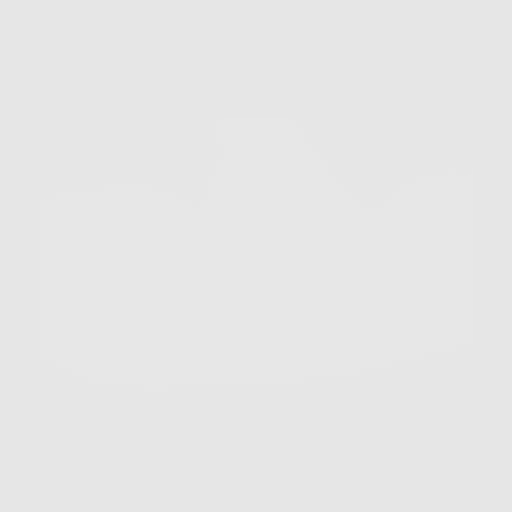

[10 of 40 positions shown; findings below may reference images not displayed]

FINDINGS: Cardiovascular: Heart size is normal. There is no significant
pericardial fluid, thickening or pericardial calcification. There is
aortic atherosclerosis, as well as atherosclerosis of the great
vessels of the mediastinum and the coronary arteries, including
calcified atherosclerotic plaque in the left main and left
circumflex coronary arteries. Calcification of the medial aspect of
the mitral annulus.

Mediastinum/Nodes: No pathologically enlarged mediastinal are hilar
lymph nodes. Please note that accurate exclusion of hilar adenopathy
is limited on noncontrast CT scans. Esophagus is unremarkable in
appearance. No axillary lymphadenopathy.

Lungs/Pleura: Again noted are multiple small pulmonary nodules
scattered throughout the lungs bilaterally, the largest of which has
a volume derived mean diameter of 4.3 mm associated with the left
major fissure in the posterior aspect of the left upper lobe (axial
image 137 of series 3). Several new small nodules are noted
throughout the lateral aspect of the right upper lobe in a
peribronchovascular distribution fat, increased in number and size
compared to the prior examination, most compatible with areas of
chronic mucoid impaction, likely related to underlying atypical
infection such as EM AMEER (mycobacterium avium intracellulare). No acute
consolidative airspace disease. No pleural effusions. Mild diffuse
bronchial wall thickening with mild centrilobular and paraseptal
emphysema.

Upper Abdomen: Aortic atherosclerosis.

Musculoskeletal: There are no aggressive appearing lytic or blastic
lesions noted in the visualized portions of the skeleton.
IMPRESSION: 1. Lung-RADS 2S, benign appearance or behavior. Continue annual
screening with low-dose chest CT without contrast in 12 months.
2. Aortic atherosclerosis, in addition to left main and left
circumflex coronary artery disease. Assessment for potential risk
factor modification, dietary therapy or pharmacologic therapy may be
warranted, if clinically indicated.
3. Mild diffuse bronchial wall thickening with mild centrilobular
and paraseptal emphysema; imaging findings suggestive of underlying
COPD.
4. Findings suggestive of progressive chronic indolent atypical
infectious process such as EM AMEER, as above.

Aortic Atherosclerosis (NAQRZ-N2S.S) and Emphysema (NAQRZ-OPH.L).

## 2019-04-29 ENCOUNTER — Ambulatory Visit (INDEPENDENT_AMBULATORY_CARE_PROVIDER_SITE_OTHER): Payer: Medicare Other | Admitting: Family Medicine

## 2019-04-29 ENCOUNTER — Telehealth: Payer: Self-pay

## 2019-04-29 ENCOUNTER — Encounter: Payer: Self-pay | Admitting: Family Medicine

## 2019-04-29 ENCOUNTER — Other Ambulatory Visit: Payer: Self-pay

## 2019-04-29 DIAGNOSIS — K219 Gastro-esophageal reflux disease without esophagitis: Secondary | ICD-10-CM

## 2019-04-29 DIAGNOSIS — E785 Hyperlipidemia, unspecified: Secondary | ICD-10-CM | POA: Diagnosis not present

## 2019-04-29 DIAGNOSIS — I10 Essential (primary) hypertension: Secondary | ICD-10-CM

## 2019-04-29 DIAGNOSIS — G4700 Insomnia, unspecified: Secondary | ICD-10-CM

## 2019-04-29 DIAGNOSIS — J302 Other seasonal allergic rhinitis: Secondary | ICD-10-CM | POA: Diagnosis not present

## 2019-04-29 MED ORDER — PANTOPRAZOLE SODIUM 40 MG PO TBEC: 40.0000 mg | DELAYED_RELEASE_TABLET | Freq: Every day | ORAL | 3 refills | Status: DC

## 2019-04-29 MED ORDER — EZETIMIBE 10 MG PO TABS: ORAL_TABLET | ORAL | 1 refills | Status: DC

## 2019-04-29 MED ORDER — FLUTICASONE PROPIONATE 50 MCG/ACT NA SUSP: 2.0000 | Freq: Every day | NASAL | 5 refills | Status: AC

## 2019-04-29 MED ORDER — ZOLPIDEM TARTRATE 5 MG PO TABS: 5.0000 mg | ORAL_TABLET | Freq: Every evening | ORAL | 1 refills | Status: DC | PRN

## 2019-04-29 NOTE — Progress Notes (Signed)
Virtual Visit via Telephone Note  I connected with Samantha Miranda on 04/29/19 at  9:30 AM EDT by telephone and verified that I am speaking with the correct person using two identifiers.  Location: Patient: home  Provider: home    I discussed the limitations, risks, security and privacy concerns of performing an evaluation and management service by telephone and the availability of in person appointments. I also discussed with the patient that there may be a patient responsible charge related to this service. The patient expressed understanding and agreed to proceed.   History of Present Illness: Pt is home with no complaints.   She needs refills and labs   Past Medical History:  Diagnosis Date  . Barrett's esophagus   . Basal cell carcinoma of left lower leg   . Breast cancer ([place])   . Gastric ulcer   . GERD (gastroesophageal reflux disease)   . History of colon polyps   . Hyperlipidemia   . Osteopenia    Current Outpatient Medications on File Prior to Visit  Medication Sig Dispense Refill  . azithromycin (ZITHROMAX Z-PAK) 250 MG tablet As directed 6 each 0  . Cholecalciferol (VITAMIN D) 2000 UNITS tablet Take 4,000 Units by mouth daily.    . Coenzyme Q10 (COQ10 PO) Take 1 each by mouth daily.    . Collagenase POWD by Does not apply route.    Marland Kitchen FLUoxetine (PROZAC) 10 MG tablet TAKE 1 TABLET BY MOUTH ONCE DAILY 90 tablet 0  . HYDROcodone-homatropine (HYCODAN) 5-1.5 MG/5ML syrup Take 5 mLs by mouth every 8 (eight) hours as needed. 100 mL 0  . HYDROcodone-homatropine (HYCODAN) 5-1.5 MG/5ML syrup Take 5 mLs by mouth every 8 (eight) hours as needed. 100 mL 0  . hydrocortisone (ANUSOL-HC) 2.5 % rectal cream place 1 APPLICATION rectally twice a day 35 g 2  . MAGNESIUM PO Take 1 each by mouth daily.    . Misc Natural Products (CHOLESTEROL SUPPORT PO) Take by mouth.    . Multiple Vitamins-Minerals (MULTIVITAMIN ADULTS PO) Take 1 tablet by mouth daily.    Marland Kitchen OVER THE COUNTER MEDICATION  Take 1 capsule by mouth daily. Cholesterol solution vitamin    . OVER THE COUNTER MEDICATION Take 2 capsules by mouth daily. Lung and bronchial support vitamin    . OVER THE COUNTER MEDICATION Take 2 capsules by mouth daily. Omega Q Plus vitamin    . XIIDRA 5 % SOLN instill 1 drop into both eyes every 12 hours  1   No current facility-administered medications on file prior to visit.        Observations/Objective: 123/62,  p 77  Afebrile  Pt in NAD  Assessment and Plan: 1. Essential hypertension Well controlled, no changes to meds. Encouraged heart healthy diet such as the DASH diet and exercise as tolerated.  - Lipid panel; Future - Comprehensive metabolic panel; Future  2. Hyperlipidemia, unspecified hyperlipidemia type Encouraged heart healthy diet, increase exercise, avoid trans fats, consider a krill oil cap daily - Lipid panel; Future - Comprehensive metabolic panel; Future - ezetimibe (ZETIA) 10 MG tablet; TAKE 1 TABLET(10 MG) BY MOUTH DAILY  Dispense: 90 tablet; Refill: 1  3. Gastroesophageal reflux disease, esophagitis presence not specified Stable Refill med She is able to take prn  - pantoprazole (PROTONIX) 40 MG tablet; Take 1 tablet (40 mg total) by mouth daily before breakfast.  Dispense: 90 tablet; Refill: 3  4. Seasonal allergies Stable with otc antihistamine and flonase  - fluticasone (FLONASE) 50 MCG/ACT  nasal spray; Place 2 sprays into both nostrils daily.  Dispense: 16 g; Refill: 5  5. Insomnia, unspecified type Only take ambien prn  - zolpidem (AMBIEN) 5 MG tablet; Take 1 tablet (5 mg total) by mouth at bedtime as needed for sleep.  Dispense: 15 tablet; Refill: 1   Follow Up Instructions:    I discussed the assessment and treatment plan with the patient. The patient was provided an opportunity to ask questions and all were answered. The patient agreed with the plan and demonstrated an understanding of the instructions.   The patient was advised to  call back or seek an in-person evaluation if the symptoms worsen or if the condition fails to improve as anticipated.  I provided 25 minutes of non-face-to-face time during this encounter.   Ann Held, DO

## 2019-04-29 NOTE — Telephone Encounter (Signed)
Call pt and let her know-- she has to be informed before new med called in belsomra 20 mg 1 po qhs #30

## 2019-04-29 NOTE — Telephone Encounter (Signed)
She has been on it off and on for years

## 2019-04-29 NOTE — Telephone Encounter (Signed)
PA denied. Insurance requiring failure, contraindication or intolerance to BOTH of the following:   Belsomra Rozerem

## 2019-04-29 NOTE — Telephone Encounter (Signed)
Ambien not covered by insurance- preferred alternatives: Belsomra and Rozerem.

## 2019-04-29 NOTE — Telephone Encounter (Signed)
Will attempt PA. PA initiated via Covermymeds; KEY: [id number]. Awaiting determination.

## 2019-04-29 NOTE — Telephone Encounter (Signed)
MyChart message sent to Pt. Awaiting response.

## 2019-05-08 ENCOUNTER — Other Ambulatory Visit: Payer: Self-pay

## 2019-05-08 ENCOUNTER — Other Ambulatory Visit (INDEPENDENT_AMBULATORY_CARE_PROVIDER_SITE_OTHER): Payer: Medicare Other

## 2019-05-08 DIAGNOSIS — E785 Hyperlipidemia, unspecified: Secondary | ICD-10-CM

## 2019-05-08 DIAGNOSIS — I10 Essential (primary) hypertension: Secondary | ICD-10-CM

## 2019-05-08 LAB — COMPREHENSIVE METABOLIC PANEL
ALT: 15 U/L (ref 0–35)
AST: 21 U/L (ref 0–37)
Albumin: 4.2 g/dL (ref 3.5–5.2)
Alkaline Phosphatase: 54 U/L (ref 39–117)
BUN: 13 mg/dL (ref 6–23)
CO2: 29 mEq/L (ref 19–32)
Calcium: 9.6 mg/dL (ref 8.4–10.5)
Chloride: 103 mEq/L (ref 96–112)
Creatinine, Ser: 0.86 mg/dL (ref 0.40–1.20)
GFR: 64.83 mL/min (ref >60.00)
Glucose, Bld: 81 mg/dL (ref 70–99)
Potassium: 4 mEq/L (ref 3.5–5.1)
Sodium: 139 mEq/L (ref 135–145)
Total Bilirubin: 0.4 mg/dL (ref 0.2–1.2)
Total Protein: 6.8 g/dL (ref 6.0–8.3)

## 2019-05-08 LAB — LIPID PANEL
Cholesterol: 200 mg/dL (ref 0–200)
HDL: 63.4 mg/dL (ref >39.00)
LDL Cholesterol: 114 mg/dL — ABNORMAL HIGH (ref 0–99)
NonHDL: 136.91
Total CHOL/HDL Ratio: 3
Triglycerides: 115 mg/dL (ref 0.0–149.0)
VLDL: 23 mg/dL (ref 0.0–40.0)

## 2019-05-22 LAB — HM MAMMOGRAPHY

## 2019-05-31 ENCOUNTER — Other Ambulatory Visit: Payer: Self-pay | Admitting: *Deleted

## 2019-05-31 DIAGNOSIS — Z87891 Personal history of nicotine dependence: Secondary | ICD-10-CM

## 2019-05-31 DIAGNOSIS — Z122 Encounter for screening for malignant neoplasm of respiratory organs: Secondary | ICD-10-CM

## 2019-06-10 ENCOUNTER — Encounter: Payer: Self-pay | Admitting: Family Medicine

## 2019-06-11 ENCOUNTER — Encounter: Payer: Self-pay | Admitting: Family Medicine

## 2019-06-11 ENCOUNTER — Other Ambulatory Visit: Payer: Self-pay

## 2019-06-11 ENCOUNTER — Ambulatory Visit (INDEPENDENT_AMBULATORY_CARE_PROVIDER_SITE_OTHER): Payer: Medicare Other | Admitting: Family Medicine

## 2019-06-11 DIAGNOSIS — J40 Bronchitis, not specified as acute or chronic: Secondary | ICD-10-CM

## 2019-06-11 DIAGNOSIS — F324 Major depressive disorder, single episode, in partial remission: Secondary | ICD-10-CM | POA: Diagnosis not present

## 2019-06-11 MED ORDER — PROMETHAZINE-DM 6.25-15 MG/5ML PO SYRP: 5.0000 mL | ORAL_SOLUTION | Freq: Four times a day (QID) | ORAL | 0 refills | Status: DC | PRN

## 2019-06-11 MED ORDER — AZITHROMYCIN 250 MG PO TABS: ORAL_TABLET | ORAL | 0 refills | Status: DC

## 2019-06-11 NOTE — Progress Notes (Signed)
Virtual Visit via Video Note  I connected with [name] on 06/11/19 at 11:30 AM EDT by a video enabled telemedicine application and verified that I am speaking with the correct person using two identifiers. Pt had trouble with her camera on her computer-- needed to transition to tele visit  Location: Patient: home  Provider: office    I discussed the limitations of evaluation and management by telemedicine and the availability of in person appointments. The patient expressed understanding and agreed to proceed.  History of Present Illness: Pt is home c/o cough, congestion and vomiting x2 .  Congestion started sat and NV since yesterday.   She had a covid test yesterday and it will take 5-7 days until it comes back   Observations/Objective: No fever  No other vitals obtained Pt is in NAD   Assessment and Plan: 1. Bronchitis abx and cough med per orders covid test pending Pt will quarantine until results are back - azithromycin (ZITHROMAX Z-PAK) 250 MG tablet; As directed  Dispense: 6 each; Refill: 0 - promethazine-dextromethorphan (PROMETHAZINE-DM) 6.25-15 MG/5ML syrup; Take 5 mLs by mouth 4 (four) times daily as needed.  Dispense: 118 mL; Refill: 0   Follow Up Instructions:    I discussed the assessment and treatment plan with the patient. The patient was provided an opportunity to ask questions and all were answered. The patient agreed with the plan and demonstrated an understanding of the instructions.   The patient was advised to call back or seek an in-person evaluation if the symptoms worsen or if the condition fails to improve as anticipated.  I provided 15 minutes of non-face-to-face time during this encounter.   Ann Held, DO

## 2019-06-24 ENCOUNTER — Other Ambulatory Visit: Payer: Self-pay | Admitting: Family Medicine

## 2019-06-24 ENCOUNTER — Encounter: Payer: Self-pay | Admitting: Family Medicine

## 2019-06-24 DIAGNOSIS — J209 Acute bronchitis, unspecified: Secondary | ICD-10-CM

## 2019-06-24 DIAGNOSIS — J440 Chronic obstructive pulmonary disease with acute lower respiratory infection: Secondary | ICD-10-CM

## 2019-06-24 MED ORDER — CEFDINIR 300 MG PO CAPS: 300.0000 mg | ORAL_CAPSULE | Freq: Two times a day (BID) | ORAL | 0 refills | Status: DC

## 2019-06-24 NOTE — Telephone Encounter (Signed)
Sorry about sending this.  She was seen on 06/11/19.  Do you want her to be seen again?

## 2019-06-24 NOTE — Telephone Encounter (Signed)
I sent omnicef to the pharmacy Let us know if that does not work

## 2019-07-19 ENCOUNTER — Ambulatory Visit (INDEPENDENT_AMBULATORY_CARE_PROVIDER_SITE_OTHER)
Admission: RE | Admit: 2019-07-19 | Discharge: 2019-07-19 | Disposition: A | Discharge status: Home / Self Care | Payer: Medicare Other | Source: Ambulatory Visit | Attending: Acute Care | Admitting: Acute Care

## 2019-07-19 ENCOUNTER — Other Ambulatory Visit: Payer: Self-pay

## 2019-07-19 DIAGNOSIS — Z87891 Personal history of nicotine dependence: Secondary | ICD-10-CM | POA: Diagnosis not present

## 2019-07-19 DIAGNOSIS — Z122 Encounter for screening for malignant neoplasm of respiratory organs: Secondary | ICD-10-CM

## 2019-07-24 ENCOUNTER — Other Ambulatory Visit: Payer: Self-pay | Admitting: *Deleted

## 2019-07-24 DIAGNOSIS — Z87891 Personal history of nicotine dependence: Secondary | ICD-10-CM

## 2019-07-24 DIAGNOSIS — Z122 Encounter for screening for malignant neoplasm of respiratory organs: Secondary | ICD-10-CM

## 2019-10-28 ENCOUNTER — Other Ambulatory Visit: Payer: Self-pay

## 2019-10-28 NOTE — Progress Notes (Signed)
Subjective:   Samantha Miranda is a 73 y.o. female who presents for Medicare Annual (Subsequent) preventive examination.  Review of Systems:  Cardiac Risk Factors include: advanced age (>63men, >52 women) Home Safety/Smoke Alarms: Feels safe in home. Smoke alarms in place.  Lives w/ husband in 2 story home. Has stair lift to use if needed. Does well with stairs.   Female:      Mammo- 05/22/19      Dexa scan- declines. States she would not take any treatment.       CCS-10/24/11. Ordered per pt request.     Objective:     Vitals: BP 128/68 (BP Location: Right Arm, Patient Position: Sitting, Cuff Size: Normal)   Pulse 68   Temp (!) 97.5 F (36.4 C) (Temporal)   Ht 5\' 3"  (1.6 m)   Wt 118 lb 6.4 oz (53.7 kg)   SpO2 98%   BMI 20.97 kg/m   Body mass index is 20.97 kg/m.  Advanced Directives 10/29/2019 12/19/2017 08/21/2017 05/19/2016 03/19/2016 11/02/2015 03/25/2014  Does Patient Have a Medical Advance Directive? No No No Yes Yes No Patient does not have advance directive  Type of Advance Directive - - - [place];Living will [place];Living will - -  Does patient want to make changes to medical advance directive? - - - No - Patient declined - - -  Copy of [place] in Chart? - - - No - copy requested No - copy requested - -  Would patient like information on creating a medical advance directive? No - Patient declined Yes (MAU/Ambulatory/Procedural Areas - Information given) - - - No - patient declined information -    Tobacco Social History   Tobacco Use  Smoking Status Former Smoker  . Packs/day: 1.00  . Years: 40.00  . Pack years: 40.00  . Quit date: 07/2011  . Years since quitting: 8.2  Smokeless Tobacco Never Used  Tobacco Comment   Pt currently using lowest strength vapes.  11/02/2015     Counseling given: Not Answered Comment: Pt currently using lowest strength vapes.  11/02/2015   Clinical Intake:     Pain :  No/denies pain                 Past Medical History:  Diagnosis Date  . Barrett's esophagus   . Basal cell carcinoma of left lower leg   . Breast cancer ([place])   . Gastric ulcer   . GERD (gastroesophageal reflux disease)   . History of colon polyps   . Hyperlipidemia   . Osteopenia    Past Surgical History:  Procedure Laterality Date  . ABDOMINAL HYSTERECTOMY  1991  . BREAST LUMPECTOMY  9/07  . CATARACT EXTRACTION, BILATERAL    . EYE SURGERY  08/2013  10/2013   cataracts   Family History  Problem Relation Age of Onset  . Heart failure Mother        CHF  . Hyperlipidemia Mother   . Aortic stenosis Mother   . Lung cancer Father 42  . Breast cancer Sister 15  . Colon cancer Maternal Grandfather   . Diabetes Maternal Grandmother   . Heart attack Neg Hx   . Hypertension Neg Hx   . Sudden death Neg Hx   . Esophageal cancer Neg Hx   . Stomach cancer Neg Hx    Social History   Socioeconomic History  . Marital status: Married    Spouse name: Not on  file  . Number of children: 1  . Years of education: Not on file  . Highest education level: Not on file  Occupational History  . 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 Financial resource strain: Not on file  . Food insecurity    Worry: Not on file    Inability: Not on file  . Transportation needs    Medical: Not on file    Non-medical: Not on file  Tobacco Use  . Smoking status: Former Smoker    Packs/day: 1.00    Years: 40.00    Pack years: 40.00    Quit date: 07/2011    Years since quitting: 8.2  . Smokeless tobacco: Never Used  . Tobacco comment: Pt currently using lowest strength vapes.  11/02/2015  Substance and Sexual Activity  . Alcohol use: No    Alcohol/week: 0.0 standard drinks  . Drug use: No  . Sexual activity: Yes  Lifestyle  . Physical activity    Days per week: Not on file    Minutes per session: Not on file  . Stress: Not on  file  Relationships  . Social Herbalist on phone: Not on file    Gets together: Not on file    Attends religious service: Not on file    Active member of club or organization: Not on file    Attends meetings of clubs or organizations: Not on file    Relationship status: Not on file  Other Topics Concern  . Not on file  Social History Narrative   Exercise-no    Outpatient Encounter Medications as of 10/29/2019  Medication Sig  . Cholecalciferol (VITAMIN D) 2000 UNITS tablet Take 4,000 Units by mouth daily.  . Coenzyme Q10 (COQ10 PO) Take 1 each by mouth daily.  . Collagenase POWD by Does not apply route.  . ezetimibe (ZETIA) 10 MG tablet TAKE 1 TABLET(10 MG) BY MOUTH DAILY  . FLUoxetine (PROZAC) 10 MG tablet TAKE 1 TABLET BY MOUTH ONCE DAILY  . fluticasone (FLONASE) 50 MCG/ACT nasal spray Place 2 sprays into both nostrils daily.  Marland Kitchen HYDROcodone-homatropine (HYCODAN) 5-1.5 MG/5ML syrup Take 5 mLs by mouth every 8 (eight) hours as needed.  . hydrocortisone (ANUSOL-HC) 2.5 % rectal cream place 1 APPLICATION rectally twice a day  . MAGNESIUM PO Take 1 each by mouth daily.  . Misc Natural Products (CHOLESTEROL SUPPORT PO) Take by mouth.  . Multiple Vitamins-Minerals (MULTIVITAMIN ADULTS PO) Take 1 tablet by mouth daily.  Marland Kitchen OVER THE COUNTER MEDICATION Take 1 capsule by mouth daily. Cholesterol solution vitamin  . OVER THE COUNTER MEDICATION Take 2 capsules by mouth daily. Lung and bronchial support vitamin  . OVER THE COUNTER MEDICATION Take 2 capsules by mouth daily. Omega Q Plus vitamin  . pantoprazole (PROTONIX) 40 MG tablet Take 1 tablet (40 mg total) by mouth daily before breakfast.  . promethazine-dextromethorphan (PROMETHAZINE-DM) 6.25-15 MG/5ML syrup Take 5 mLs by mouth 4 (four) times daily as needed.  Marland Kitchen XIIDRA 5 % SOLN instill 1 drop into both eyes every 12 hours  . zolpidem (AMBIEN) 5 MG tablet Take 1 tablet (5 mg total) by mouth at bedtime as needed for sleep.  .  [DISCONTINUED] azithromycin (ZITHROMAX Z-PAK) 250 MG tablet As directed  . [DISCONTINUED] azithromycin (ZITHROMAX Z-PAK) 250 MG tablet As directed  . [DISCONTINUED] cefdinir (OMNICEF) 300 MG capsule Take 1 capsule (300 mg total) by mouth  2 (two) times daily.  . [DISCONTINUED] HYDROcodone-homatropine (HYCODAN) 5-1.5 MG/5ML syrup Take 5 mLs by mouth every 8 (eight) hours as needed.   No facility-administered encounter medications on file as of 10/29/2019.     Activities of Daily Living In your present state of health, do you have any difficulty performing the following activities: 10/29/2019  Hearing? Y  Comment wears hearing aid in right ear  Vision? N  Difficulty concentrating or making decisions? N  Walking or climbing stairs? N  Dressing or bathing? N  Doing errands, shopping? N  Preparing Food and eating ? N  Using the Toilet? N  In the past six months, have you accidently leaked urine? N  Do you have problems with loss of bowel control? N  Managing your Medications? N  Managing your Finances? N  Housekeeping or managing your Housekeeping? N  Some recent data might be hidden    Patient Care Team: Carollee Herter, Alferd Apa, DO as PCP - General Shirley Muscat Loreen Freud, MD as Referring Physician (Optometry) Izora Ribas (Dermatology)    Assessment:   This is a routine wellness examination for [place]. Physical assessment deferred to PCP.  Exercise Activities and Dietary recommendations Current Exercise Habits: The patient does not participate in regular exercise at present, Exercise limited by: None identified Diet (meal preparation, eat out, water intake, caffeinated beverages, dairy products, fruits and vegetables): well balanced  Goals    . DIET - INCREASE WATER INTAKE    . Remain active and independent.   (pt-stated)     Continue with current routine.         Fall Risk Fall Risk  10/29/2019 12/19/2017 06/05/2017 05/19/2016 11/02/2015  Falls in the past year? 0 No No No No   Follow up Education provided;Falls prevention discussed - - - -     Depression Screen PHQ 2/9 Scores 10/29/2019 04/24/2018 12/19/2017 06/05/2017  PHQ - 2 Score 0 0 0 0  PHQ- 9 Score - - - -  Exception Documentation - - - -     Cognitive Function Ad8 score reviewed for issues:  Issues making decisions:no  Less interest in hobbies / activities:no  Repeats questions, stories (family complaining):no  Trouble using ordinary gadgets (microwave, computer, phone):no  Forgets the month or year: no  Mismanaging finances: no  Remembering appts:no  Daily problems with thinking and/or memory:no Ad8 score is=0  MMSE - Mini Mental State Exam 12/19/2017  Orientation to time 5  Orientation to Place 5  Registration 3  Attention/ Calculation 5  Recall 3  Language- name 2 objects 2  Language- repeat 1  Language- follow 3 step command 3  Language- read & follow direction 1  Write a sentence 1  Copy design 1  Total score 30        Immunization History  Administered Date(s) Administered  . Influenza Split 09/22/2011  . Influenza, High Dose Seasonal PF 10/10/2014, 11/02/2015, 08/26/2017  . Influenza-Unspecified 08/26/2017, 09/17/2018  . Pneumococcal Conjugate-13 04/21/2015  . Pneumococcal Polysaccharide-23 03/03/2014  . Td 08/26/2010  . Zoster 04/02/2009  . Zoster Recombinat (Shingrix) 06/11/2017, 08/26/2017   Screening Tests Health Maintenance  Topic Date Due  . INFLUENZA VACCINE  06/22/2019  . MAMMOGRAM  05/21/2020  . TETANUS/TDAP  08/26/2020  . COLONOSCOPY  10/23/2021  . Hepatitis C Screening  Completed  . PNA vac Low Risk Adult  Completed  . DEXA SCAN  Addressed      Plan:   See you next year!  Continue to eat heart  healthy diet (full of fruits, vegetables, whole grains, lean protein, water--limit salt, fat, and sugar intake) and increase physical activity as tolerated.  Continue doing brain stimulating activities (puzzles, reading, adult coloring books, staying  active) to keep memory sharp.   Bring a copy of your living will and/or healthcare power of attorney to your next office visit.  I have ordered your colonoscopy as you requested.  I have personally reviewed and noted the following in the patient's chart:   . Medical and social history . Use of alcohol, tobacco or illicit drugs  . Current medications and supplements . Functional ability and status . Nutritional status . Physical activity . Advanced directives . List of other physicians . Hospitalizations, surgeries, and ER visits in previous 12 months . Vitals . Screenings to include cognitive, depression, and falls . Referrals and appointments  In addition, I have reviewed and discussed with patient certain preventive protocols, quality metrics, and best practice recommendations. A written personalized care plan for preventive services as well as general preventive health recommendations were provided to patient.     Shela Nevin, [place]  10/29/2019

## 2019-10-29 ENCOUNTER — Other Ambulatory Visit: Payer: Self-pay

## 2019-10-29 ENCOUNTER — Ambulatory Visit (INDEPENDENT_AMBULATORY_CARE_PROVIDER_SITE_OTHER): Payer: Medicare Other | Admitting: *Deleted

## 2019-10-29 ENCOUNTER — Ambulatory Visit (INDEPENDENT_AMBULATORY_CARE_PROVIDER_SITE_OTHER): Payer: Medicare Other | Admitting: Family Medicine

## 2019-10-29 ENCOUNTER — Encounter: Payer: Self-pay | Admitting: *Deleted

## 2019-10-29 ENCOUNTER — Encounter: Payer: Self-pay | Admitting: Family Medicine

## 2019-10-29 VITALS — BP 128/68 | HR 68 | Temp 97.5°F | Ht 63.0 in | Wt 118.4 lb

## 2019-10-29 VITALS — BP 128/68 | HR 68 | Temp 97.5°F | Resp 18 | Ht 63.0 in | Wt 118.4 lb

## 2019-10-29 DIAGNOSIS — R3129 Other microscopic hematuria: Secondary | ICD-10-CM

## 2019-10-29 DIAGNOSIS — E785 Hyperlipidemia, unspecified: Secondary | ICD-10-CM

## 2019-10-29 DIAGNOSIS — Z0000 Encounter for general adult medical examination without abnormal findings: Secondary | ICD-10-CM

## 2019-10-29 DIAGNOSIS — R350 Frequency of micturition: Secondary | ICD-10-CM

## 2019-10-29 DIAGNOSIS — Z1211 Encounter for screening for malignant neoplasm of colon: Secondary | ICD-10-CM

## 2019-10-29 DIAGNOSIS — Z23 Encounter for immunization: Secondary | ICD-10-CM

## 2019-10-29 LAB — POC URINALSYSI DIPSTICK (AUTOMATED)
Bilirubin, UA: NEGATIVE
Glucose, UA: NEGATIVE
Ketones, UA: NEGATIVE
Leukocytes, UA: NEGATIVE
Nitrite, UA: NEGATIVE
Protein, UA: NEGATIVE
Spec Grav, UA: 1.015 (ref 1.010–1.025)
Urobilinogen, UA: 0.2 E.U./dL
pH, UA: 5.5 (ref 5.0–8.0)

## 2019-10-29 LAB — COMPREHENSIVE METABOLIC PANEL
ALT: 12 U/L (ref 0–35)
AST: 18 U/L (ref 0–37)
Albumin: 4.3 g/dL (ref 3.5–5.2)
Alkaline Phosphatase: 53 U/L (ref 39–117)
BUN: 17 mg/dL (ref 6–23)
CO2: 27 mEq/L (ref 19–32)
Calcium: 9.4 mg/dL (ref 8.4–10.5)
Chloride: 101 mEq/L (ref 96–112)
Creatinine, Ser: 0.92 mg/dL (ref 0.40–1.20)
GFR: 59.9 mL/min — ABNORMAL LOW (ref >60.00)
Glucose, Bld: 77 mg/dL (ref 70–99)
Potassium: 3.7 mEq/L (ref 3.5–5.1)
Sodium: 138 mEq/L (ref 135–145)
Total Bilirubin: 0.4 mg/dL (ref 0.2–1.2)
Total Protein: 7 g/dL (ref 6.0–8.3)

## 2019-10-29 LAB — LIPID PANEL
Cholesterol: 217 mg/dL — ABNORMAL HIGH (ref 0–200)
HDL: 60.2 mg/dL (ref >39.00)
LDL Cholesterol: 136 mg/dL — ABNORMAL HIGH (ref 0–99)
NonHDL: 156.5
Total CHOL/HDL Ratio: 4
Triglycerides: 102 mg/dL (ref 0.0–149.0)
VLDL: 20.4 mg/dL (ref 0.0–40.0)

## 2019-10-29 NOTE — Assessment & Plan Note (Signed)
Encouraged heart healthy diet, increase exercise, avoid trans fats, consider a krill oil cap daily 

## 2019-10-29 NOTE — Progress Notes (Signed)
Patient ID: Samantha Miranda, female    DOB: Aug 14, 1946  Age: 73 y.o. MRN: [id number]    Subjective:  Subjective  HPI Samantha Miranda presents for -f/u cholesterol and flu shot   Pt also c/o dysuria and frequency for last several days.   No other complaints.   Review of Systems  Constitutional: Negative for appetite change, diaphoresis, fatigue and unexpected weight change.  Eyes: Negative for pain, redness and visual disturbance.  Respiratory: Negative for cough, chest tightness, shortness of breath and wheezing.   Cardiovascular: Negative for chest pain, palpitations and leg swelling.  Endocrine: Negative for cold intolerance, heat intolerance, polydipsia, polyphagia and polyuria.  Genitourinary: Positive for dysuria and frequency. Negative for difficulty urinating, urgency, vaginal bleeding, vaginal discharge and vaginal pain.  Neurological: Negative for dizziness, light-headedness, numbness and headaches.    History Past Medical History:  Diagnosis Date  . Barrett's esophagus   . Basal cell carcinoma of left lower leg   . Breast cancer ([place])   . Gastric ulcer   . GERD (gastroesophageal reflux disease)   . History of colon polyps   . Hyperlipidemia   . Osteopenia     She has a past surgical history that includes Abdominal hysterectomy (1991); Breast lumpectomy (9/07); Cataract extraction, bilateral; and Eye surgery (08/2013  10/2013).   Her family history includes Aortic stenosis in her mother; Breast cancer (age of onset: 50) in her sister; Colon cancer in her maternal grandfather; Diabetes in her maternal grandmother; Heart failure in her mother; Hyperlipidemia in her mother; Lung cancer (age of onset: 88) in her father.She reports that she quit smoking about 8 years ago. She has a 40.00 pack-year smoking history. She has never used smokeless tobacco. She reports that she does not drink alcohol or use drugs.  Current Outpatient Medications on File Prior to Visit  Medication Sig  Dispense Refill  . Cholecalciferol (VITAMIN D) 2000 UNITS tablet Take 4,000 Units by mouth daily.    . Coenzyme Q10 (COQ10 PO) Take 1 each by mouth daily.    . Collagenase POWD by Does not apply route.    . ezetimibe (ZETIA) 10 MG tablet TAKE 1 TABLET(10 MG) BY MOUTH DAILY 90 tablet 1  . FLUoxetine (PROZAC) 10 MG tablet TAKE 1 TABLET BY MOUTH ONCE DAILY 90 tablet 0  . fluticasone (FLONASE) 50 MCG/ACT nasal spray Place 2 sprays into both nostrils daily. 16 g 5  . HYDROcodone-homatropine (HYCODAN) 5-1.5 MG/5ML syrup Take 5 mLs by mouth every 8 (eight) hours as needed. 100 mL 0  . hydrocortisone (ANUSOL-HC) 2.5 % rectal cream place 1 APPLICATION rectally twice a day 35 g 2  . MAGNESIUM PO Take 1 each by mouth daily.    . Misc Natural Products (CHOLESTEROL SUPPORT PO) Take by mouth.    . Multiple Vitamins-Minerals (MULTIVITAMIN ADULTS PO) Take 1 tablet by mouth daily.    Marland Kitchen OVER THE COUNTER MEDICATION Take 1 capsule by mouth daily. Cholesterol solution vitamin    . OVER THE COUNTER MEDICATION Take 2 capsules by mouth daily. Lung and bronchial support vitamin    . OVER THE COUNTER MEDICATION Take 2 capsules by mouth daily. Omega Q Plus vitamin    . pantoprazole (PROTONIX) 40 MG tablet Take 1 tablet (40 mg total) by mouth daily before breakfast. 90 tablet 3  . promethazine-dextromethorphan (PROMETHAZINE-DM) 6.25-15 MG/5ML syrup Take 5 mLs by mouth 4 (four) times daily as needed. 118 mL 0  . XIIDRA 5 % SOLN instill 1 drop into  both eyes every 12 hours  1  . zolpidem (AMBIEN) 5 MG tablet Take 1 tablet (5 mg total) by mouth at bedtime as needed for sleep. 15 tablet 1   No current facility-administered medications on file prior to visit.      Objective:  Objective  Physical Exam Vitals signs and nursing note reviewed.  Constitutional:      Appearance: She is well-developed.  HENT:     Head: Normocephalic and atraumatic.  Eyes:     Conjunctiva/sclera: Conjunctivae normal.  Neck:      Musculoskeletal: Normal range of motion and neck supple.     Thyroid: No thyromegaly.     Vascular: No carotid bruit or JVD.  Cardiovascular:     Rate and Rhythm: Normal rate and regular rhythm.     Heart sounds: Normal heart sounds. No murmur.  Pulmonary:     Effort: Pulmonary effort is normal. No respiratory distress.     Breath sounds: Normal breath sounds. No wheezing or rales.  Chest:     Chest wall: No tenderness.  Neurological:     Mental Status: She is alert and oriented to person, place, and time.    BP 128/68 (BP Location: Right Arm, Patient Position: Sitting, Cuff Size: Normal)   Pulse 68   Temp (!) 97.5 F (36.4 C) (Temporal)   Resp 18   Ht 5\' 3"  (1.6 m)   Wt 118 lb 6.4 oz (53.7 kg)   SpO2 98%   BMI 20.97 kg/m  Wt Readings from Last 3 Encounters:  10/29/19 118 lb 6.4 oz (53.7 kg)  10/29/19 118 lb 6.4 oz (53.7 kg)  06/21/18 118 lb 3.2 oz (53.6 kg)     Lab Results  Component Value Date   WBC 5.7 08/04/2017   HGB 13.0 08/04/2017   HCT 38.5 08/04/2017   PLT 210 08/04/2017   GLUCOSE 77 10/29/2019   CHOL 217 (H) 10/29/2019   TRIG 102.0 10/29/2019   HDL 60.20 10/29/2019   LDLDIRECT 176.2 08/12/2013   LDLCALC 136 (H) 10/29/2019   ALT 12 10/29/2019   AST 18 10/29/2019   NA 138 10/29/2019   K 3.7 10/29/2019   CL 101 10/29/2019   CREATININE 0.92 10/29/2019   BUN 17 10/29/2019   CO2 27 10/29/2019   TSH 1.07 08/04/2017   HGBA1C 5.1 08/04/2017   MICROALBUR 1.1 08/12/2013    Ct Chest Lung Ca Screen Low Dose W/o Cm  Result Date: 07/20/2019 CLINICAL DATA:  Thirty-nine pack-year smoking history. Quit 4 years ago. Asymptomatic. EXAM: CT CHEST WITHOUT CONTRAST LOW-DOSE FOR LUNG CANCER SCREENING TECHNIQUE: Multidetector CT imaging of the chest was performed following the standard protocol without IV contrast. COMPARISON:  05/15/2018 FINDINGS: Cardiovascular: Aortic and branch vessel atherosclerosis. Normal heart size, without pericardial effusion. Left main coronary  artery calcification. Mediastinum/Nodes: No mediastinal or definite hilar adenopathy, given limitations of unenhanced CT. Lungs/Pleura: No pleural fluid. Mild to moderate centrilobular emphysema. Redemonstration of clustered posterior right upper lobe "tree-in-bud" nodularity. Other scattered pulmonary nodules are similar, including at maximally volume derived equivalent diameter 4.8 mm. Upper Abdomen: Normal imaged portions of the liver, spleen, stomach, pancreas, adrenal glands, kidneys. Musculoskeletal: Upper thoracic spondylosis. IMPRESSION: 1. Lung-RADS 2, benign appearance or behavior. Continue annual screening with low-dose chest CT without contrast in 12 months. 2. Aortic atherosclerosis (ICD10-I70.0), coronary artery atherosclerosis and emphysema (ICD10-J43.9). 3. Similar posterior right upper lobe similar clustered "tree-in-bud" nodularity, likely related to prior infectious bronchiolitis or aspiration. Electronically Signed   By: Marylyn Ishihara  Jobe Igo M.D.   On: 07/20/2019 06:42     Assessment & Plan:  Plan  I have discontinued Mayo L. Stockdale's azithromycin, azithromycin, and cefdinir. I am also having her maintain her Vitamin D, MAGNESIUM PO, Coenzyme Q10 (COQ10 PO), Multiple Vitamins-Minerals (MULTIVITAMIN ADULTS PO), Collagenase, Misc Natural Products (CHOLESTEROL SUPPORT PO), Xiidra, OVER THE COUNTER MEDICATION, OVER THE COUNTER MEDICATION, OVER THE COUNTER MEDICATION, HYDROcodone-homatropine, FLUoxetine, hydrocortisone, ezetimibe, fluticasone, pantoprazole, zolpidem, and promethazine-dextromethorphan.  No orders of the defined types were placed in this encounter.   Problem List Items Addressed This Visit      Unprioritized   Hyperlipidemia - Primary    Encouraged heart healthy diet, increase exercise, avoid trans fats, consider a krill oil cap daily      Relevant Orders   Lipid panel (Completed)   Comprehensive metabolic panel (Completed)    Other Visit Diagnoses    Need for influenza  vaccination       Relevant Orders   Flu Vaccine QUAD High Dose(Fluad) (Completed)   Frequency of urination       Relevant Orders   POCT Urinalysis Dipstick (Automated) (Completed)   Microscopic hematuria       Relevant Orders   Urine Culture      Follow-up: Return in about 6 months (around 04/28/2020), or if symptoms worsen or fail to improve, for fasting, annual exam.  Ann Held, DO

## 2019-10-29 NOTE — Patient Instructions (Signed)
Cholesterol Content in Foods Cholesterol is a waxy, fat-like substance that helps to carry fat in the blood. The body needs cholesterol in small amounts, but too much cholesterol can cause damage to the arteries and heart. Most people should eat less than 200 milligrams (mg) of cholesterol a day. Foods with cholesterol  Cholesterol is found in animal-based foods, such as meat, seafood, and dairy. Generally, low-fat dairy and lean meats have less cholesterol than full-fat dairy and fatty meats. The milligrams of cholesterol per serving (mg per serving) of common cholesterol-containing foods are listed below. Meat and other proteins  Egg - one large whole egg has 186 mg.  Veal shank - 4 oz has 141 mg.  Lean ground turkey (93% lean) - 4 oz has 118 mg.  Fat-trimmed lamb loin - 4 oz has 106 mg.  Lean ground beef (90% lean) - 4 oz has 100 mg.  Lobster - 3.5 oz has 90 mg.  Pork loin chops - 4 oz has 86 mg.  Canned salmon - 3.5 oz has 83 mg.  Fat-trimmed beef top loin - 4 oz has 78 mg.  Frankfurter - 1 frank (3.5 oz) has 77 mg.  Crab - 3.5 oz has 71 mg.  Roasted chicken without skin, white meat - 4 oz has 66 mg.  Light bologna - 2 oz has 45 mg.  Deli-cut turkey - 2 oz has 31 mg.  Canned tuna - 3.5 oz has 31 mg.  Bacon - 1 oz has 29 mg.  Oysters and mussels (raw) - 3.5 oz has 25 mg.  Mackerel - 1 oz has 22 mg.  Trout - 1 oz has 20 mg.  Pork sausage - 1 link (1 oz) has 17 mg.  Salmon - 1 oz has 16 mg.  Tilapia - 1 oz has 14 mg. Dairy  Soft-serve ice cream -  cup (4 oz) has 103 mg.  Whole-milk yogurt - 1 cup (8 oz) has 29 mg.  Cheddar cheese - 1 oz has 28 mg.  American cheese - 1 oz has 28 mg.  Whole milk - 1 cup (8 oz) has 23 mg.  2% milk - 1 cup (8 oz) has 18 mg.  Cream cheese - 1 tablespoon (Tbsp) has 15 mg.  Cottage cheese -  cup (4 oz) has 14 mg.  Low-fat (1%) milk - 1 cup (8 oz) has 10 mg.  Sour cream - 1 Tbsp has 8.5 mg.  Low-fat yogurt - 1 cup  (8 oz) has 8 mg.  Nonfat Greek yogurt - 1 cup (8 oz) has 7 mg.  Half-and-half cream - 1 Tbsp has 5 mg. Fats and oils  Cod liver oil - 1 tablespoon (Tbsp) has 82 mg.  Butter - 1 Tbsp has 15 mg.  Lard - 1 Tbsp has 14 mg.  Bacon grease - 1 Tbsp has 14 mg.  Mayonnaise - 1 Tbsp has 5-10 mg.  Margarine - 1 Tbsp has 3-10 mg. Exact amounts of cholesterol in these foods may vary depending on specific ingredients and brands. Foods without cholesterol Most plant-based foods do not have cholesterol unless you combine them with a food that has cholesterol. Foods without cholesterol include:  Grains and cereals.  Vegetables.  Fruits.  Vegetable oils, such as olive, canola, and sunflower oil.  Legumes, such as peas, beans, and lentils.  Nuts and seeds.  Egg whites. Summary  The body needs cholesterol in small amounts, but too much cholesterol can cause damage to the arteries and heart.    Most people should eat less than 200 milligrams (mg) of cholesterol a day. This information is not intended to replace advice given to you by your health care provider. Make sure you discuss any questions you have with your health care provider. Document Released: 07/04/2017 Document Revised: 10/20/2017 Document Reviewed: 07/04/2017 Elsevier Patient Education  2020 Elsevier Inc.  

## 2019-10-29 NOTE — Patient Instructions (Signed)
See you next year!  Continue to eat heart healthy diet (full of fruits, vegetables, whole grains, lean protein, water--limit salt, fat, and sugar intake) and increase physical activity as tolerated.  Continue doing brain stimulating activities (puzzles, reading, adult coloring books, staying active) to keep memory sharp.   Bring a copy of your living will and/or healthcare power of attorney to your next office visit.  I have ordered your colonoscopy as you requested.   Samantha Miranda , Thank you for taking time to come for your Medicare Wellness Visit. I appreciate your ongoing commitment to your health goals. Please review the following plan we discussed and let me know if I can assist you in the future.   These are the goals we discussed: Goals    . DIET - INCREASE WATER INTAKE    . Remain active and independent.   (pt-stated)     Continue with current routine.         This is a list of the screening recommended for you and due dates:  Health Maintenance  Topic Date Due  . Flu Shot  06/22/2019  . Mammogram  05/21/2020  . Tetanus Vaccine  08/26/2020  . Colon Cancer Screening  10/23/2021  .  Hepatitis C: One time screening is recommended by Center for Disease Control  (CDC) for  adults born from [birth year] through 1965.   Completed  . Pneumonia vaccines  Completed  . DEXA scan (bone density measurement)  Addressed    Preventive Care 38 Years and Older, Female Preventive care refers to lifestyle choices and visits with your health care provider that can promote health and wellness. This includes:  A yearly physical exam. This is also called an annual well check.  Regular dental and eye exams.  Immunizations.  Screening for certain conditions.  Healthy lifestyle choices, such as diet and exercise. What can I expect for my preventive care visit? Physical exam Your health care provider will check:  Height and weight. These may be used to calculate body mass index (BMI), which is a  measurement that tells if you are at a healthy weight.  Heart rate and blood pressure.  Your skin for abnormal spots. Counseling Your health care provider may ask you questions about:  Alcohol, tobacco, and drug use.  Emotional well-being.  Home and relationship well-being.  Sexual activity.  Eating habits.  History of falls.  Memory and ability to understand (cognition).  Work and work Statistician.  Pregnancy and menstrual history. What immunizations do I need?  Influenza (flu) vaccine  This is recommended every year. Tetanus, diphtheria, and pertussis (Tdap) vaccine  You may need a Td booster every 10 years. Varicella (chickenpox) vaccine  You may need this vaccine if you have not already been vaccinated. Zoster (shingles) vaccine  You may need this after age 80. Pneumococcal conjugate (PCV13) vaccine  One dose is recommended after age 95. Pneumococcal polysaccharide (PPSV23) vaccine  One dose is recommended after age 30. Measles, mumps, and rubella (MMR) vaccine  You may need at least one dose of MMR if you were born in 1957 or later. You may also need a second dose. Meningococcal conjugate (MenACWY) vaccine  You may need this if you have certain conditions. Hepatitis A vaccine  You may need this if you have certain conditions or if you travel or work in places where you may be exposed to hepatitis A. Hepatitis B vaccine  You may need this if you have certain conditions or if you travel  or work in places where you may be exposed to hepatitis B. Haemophilus influenzae type b (Hib) vaccine  You may need this if you have certain conditions. You may receive vaccines as individual doses or as more than one vaccine together in one shot (combination vaccines). Talk with your health care provider about the risks and benefits of combination vaccines. What tests do I need? Blood tests  Lipid and cholesterol levels. These may be checked every 5 years, or more  frequently depending on your overall health.  Hepatitis C test.  Hepatitis B test. Screening  Lung cancer screening. You may have this screening every year starting at age 61 if you have a 30-pack-year history of smoking and currently smoke or have quit within the past 15 years.  Colorectal cancer screening. All adults should have this screening starting at age 83 and continuing until age 63. Your health care provider may recommend screening at age 62 if you are at increased risk. You will have tests every 1-10 years, depending on your results and the type of screening test.  Diabetes screening. This is done by checking your blood sugar (glucose) after you have not eaten for a while (fasting). You may have this done every 1-3 years.  Mammogram. This may be done every 1-2 years. Talk with your health care provider about how often you should have regular mammograms.  BRCA-related cancer screening. This may be done if you have a family history of breast, ovarian, tubal, or peritoneal cancers. Other tests  Sexually transmitted disease (STD) testing.  Bone density scan. This is done to screen for osteoporosis. You may have this done starting at age 32. Follow these instructions at home: Eating and drinking  Eat a diet that includes fresh fruits and vegetables, whole grains, lean protein, and low-fat dairy products. Limit your intake of foods with high amounts of sugar, saturated fats, and salt.  Take vitamin and mineral supplements as recommended by your health care provider.  Do not drink alcohol if your health care provider tells you not to drink.  If you drink alcohol: ? Limit how much you have to 0-1 drink a day. ? Be aware of how much alcohol is in your drink. In the [place] one drink equals one 12 oz bottle of beer (355 mL), one 5 oz glass of wine (148 mL), or one 1 oz glass of hard liquor (44 mL). Lifestyle  Take daily care of your teeth and gums.  Stay active. Exercise for at  least 30 minutes on 5 or more days each week.  Do not use any products that contain nicotine or tobacco, such as cigarettes, e-cigarettes, and chewing tobacco. If you need help quitting, ask your health care provider.  If you are sexually active, practice safe sex. Use a condom or other form of protection in order to prevent STIs (sexually transmitted infections).  Talk with your health care provider about taking a low-dose aspirin or statin. What's next?  Go to your health care provider once a year for a well check visit.  Ask your health care provider how often you should have your eyes and teeth checked.  Stay up to date on all vaccines. This information is not intended to replace advice given to you by your health care provider. Make sure you discuss any questions you have with your health care provider. Document Released: 12/04/2015 Document Revised: 11/01/2018 Document Reviewed: 11/01/2018 Elsevier Patient Education  2020 [employer].

## 2019-10-30 ENCOUNTER — Other Ambulatory Visit: Payer: Self-pay | Admitting: Family Medicine

## 2019-10-30 DIAGNOSIS — E785 Hyperlipidemia, unspecified: Secondary | ICD-10-CM

## 2019-10-31 LAB — URINE CULTURE
MICRO NUMBER:: 1175831
Result:: NO GROWTH
SPECIMEN QUALITY:: ADEQUATE

## 2019-11-01 ENCOUNTER — Ambulatory Visit (AMBULATORY_SURGERY_CENTER): Payer: Medicare Other | Admitting: *Deleted

## 2019-11-01 ENCOUNTER — Other Ambulatory Visit: Payer: Self-pay

## 2019-11-01 VITALS — Temp 97.6°F | Ht 63.0 in | Wt 120.0 lb

## 2019-11-01 DIAGNOSIS — Z1159 Encounter for screening for other viral diseases: Secondary | ICD-10-CM

## 2019-11-01 DIAGNOSIS — Z86010 Personal history of colonic polyps: Secondary | ICD-10-CM

## 2019-11-01 MED ORDER — NA SULFATE-K SULFATE-MG SULF 17.5-3.13-1.6 GM/177ML PO SOLN: 1.0000 | Freq: Once | ORAL | 0 refills | Status: AC

## 2019-11-01 NOTE — Progress Notes (Signed)

## 2019-11-11 ENCOUNTER — Encounter: Payer: Self-pay | Admitting: Gastroenterology

## 2019-11-11 ENCOUNTER — Encounter: Payer: Medicare Other | Admitting: Gastroenterology

## 2019-11-20 ENCOUNTER — Ambulatory Visit (INDEPENDENT_AMBULATORY_CARE_PROVIDER_SITE_OTHER): Payer: Medicare Other

## 2019-11-20 ENCOUNTER — Other Ambulatory Visit: Payer: Self-pay | Admitting: Gastroenterology

## 2019-11-20 DIAGNOSIS — Z1159 Encounter for screening for other viral diseases: Secondary | ICD-10-CM

## 2019-11-21 LAB — SARS CORONAVIRUS 2 (TAT 6-24 HRS): SARS Coronavirus 2: NEGATIVE

## 2019-11-25 ENCOUNTER — Encounter: Payer: Self-pay | Admitting: Gastroenterology

## 2019-11-25 ENCOUNTER — Other Ambulatory Visit: Payer: Self-pay

## 2019-11-25 ENCOUNTER — Ambulatory Visit (AMBULATORY_SURGERY_CENTER): Payer: Medicare PPO | Admitting: Gastroenterology

## 2019-11-25 VITALS — BP 119/62 | HR 64 | Temp 97.9°F | Resp 13 | Ht 63.0 in | Wt 120.0 lb

## 2019-11-25 DIAGNOSIS — Z1211 Encounter for screening for malignant neoplasm of colon: Secondary | ICD-10-CM | POA: Diagnosis not present

## 2019-11-25 DIAGNOSIS — Z86010 Personal history of colonic polyps: Secondary | ICD-10-CM

## 2019-11-25 DIAGNOSIS — D122 Benign neoplasm of ascending colon: Secondary | ICD-10-CM

## 2019-11-25 DIAGNOSIS — Z800 Family history of malignant neoplasm of digestive organs: Secondary | ICD-10-CM | POA: Diagnosis not present

## 2019-11-25 MED ORDER — SODIUM CHLORIDE 0.9 % IV SOLN: 500.0000 mL | Freq: Once | INTRAVENOUS | Status: DC

## 2019-11-25 NOTE — Op Note (Signed)
[facility] Patient Name: Samantha Miranda Procedure Date: 11/25/2019 11:19 AM MRN: [id number] Endoscopist: [place]. Loletha Carrow , MD Age: 74 Referring MD:  Date of Birth: October 06, 1946 Gender: Female Account #: [id number] Procedure:                Colonoscopy Indications:              Surveillance: Personal history of colonic polyps                            (unknown histology) on last colonoscopy more than 5                            years ago (reported polyps and poor prep 10/2011 by                            Dr. Virgel Bouquet) Medicines:                Monitored Anesthesia Care Procedure:                Pre-Anesthesia Assessment:                           - Prior to the procedure, a History and Physical                            was performed, and patient medications and                            allergies were reviewed. The patient's tolerance of                            previous anesthesia was also reviewed. The risks                            and benefits of the procedure and the sedation                            options and risks were discussed with the patient.                            All questions were answered, and informed consent                            was obtained. Prior Anticoagulants: The patient has                            taken no previous anticoagulant or antiplatelet                            agents. ASA Grade Assessment: II - A patient with                            mild systemic disease. After reviewing the risks  and benefits, the patient was deemed in                            satisfactory condition to undergo the procedure.                           After obtaining informed consent, the colonoscope                            was passed under direct vision. Throughout the                            procedure, the patient's blood pressure, pulse, and                            oxygen saturations were monitored  continuously. The                            Colonoscope was introduced through the anus and                            advanced to the the cecum, identified by                            appendiceal orifice and ileocecal valve. The                            colonoscopy was somewhat difficult due to a                            tortuous colon. Successful completion of the                            procedure was aided by withdrawing the scope and                            replacing with the pediatric colonoscope. The                            patient tolerated the procedure well. The quality                            of the bowel preparation was excellent. The                            ileocecal valve, appendiceal orifice, and rectum                            were photographed. The bowel preparation used was 2                            day Suprep/Miralax. Scope In: 11:33:25 AM Scope Out: 11:45:47 AM Scope Withdrawal Time: 0 hours 8 minutes 2 seconds  Total Procedure Duration: 0 hours 12 minutes  22 seconds  Findings:                 The perianal and digital rectal examinations were                            normal.                           A diminutive polyp was found in the ascending                            colon. The polyp was sessile. The polyp was removed                            with a cold biopsy forceps. Resection and retrieval                            were complete.                           The exam was otherwise without abnormality on                            direct and retroflexion views. Complications:            No immediate complications. Estimated Blood Loss:     Estimated blood loss was minimal. Impression:               - One diminutive polyp in the ascending colon,                            removed with a cold biopsy forceps. Resected and                            retrieved.                           - The examination was otherwise normal on direct                             and retroflexion views. Recommendation:           - Patient has a contact number available for                            emergencies. The signs and symptoms of potential                            delayed complications were discussed with the                            patient. Return to normal activities tomorrow.                            Written discharge instructions were provided to the  patient.                           - Resume previous diet.                           - Continue present medications.                           - Await pathology results.                           - Based on current guidelines, no repeat                            surveillance colonoscopy. Tesneem Dufrane L. Loletha Carrow, MD 11/25/2019 11:50:52 AM This report has been signed electronically.

## 2019-11-25 NOTE — Progress Notes (Signed)
Called to room to assist during endoscopic procedure.  Patient ID and intended procedure confirmed with present staff. Received instructions for my participation in the procedure from the performing physician.  

## 2019-11-25 NOTE — Progress Notes (Signed)
To PACU, VSS. Report to [email or web address] 

## 2019-11-25 NOTE — Progress Notes (Signed)
Vitals-[facility] Temp-Yesi  Pt's states no medical or surgical changes since previsit or office visit.

## 2019-11-25 NOTE — Patient Instructions (Signed)
Handout for polyps given.  YOU HAD AN ENDOSCOPIC PROCEDURE TODAY AT [facility]:   Refer to the procedure report that was given to you for any specific questions about what was found during the examination.  If the procedure report does not answer your questions, please call your gastroenterologist to clarify.  If you requested that your care partner not be given the details of your procedure findings, then the procedure report has been included in a sealed envelope for you to review at your convenience later.  YOU SHOULD EXPECT: Some feelings of bloating in the abdomen. Passage of more gas than usual.  Walking can help get rid of the air that was put into your GI tract during the procedure and reduce the bloating. If you had a lower endoscopy (such as a colonoscopy or flexible sigmoidoscopy) you may notice spotting of blood in your stool or on the toilet paper. If you underwent a bowel prep for your procedure, you may not have a normal bowel movement for a few days.  Please Note:  You might notice some irritation and congestion in your nose or some drainage.  This is from the oxygen used during your procedure.  There is no need for concern and it should clear up in a day or so.  SYMPTOMS TO REPORT IMMEDIATELY:   Following lower endoscopy (colonoscopy or flexible sigmoidoscopy):  Excessive amounts of blood in the stool  Significant tenderness or worsening of abdominal pains  Swelling of the abdomen that is new, acute  Fever of 100F or higher  For urgent or emergent issues, a gastroenterologist can be reached at any hour by calling [phone].   DIET:  We do recommend a small meal at first, but then you may proceed to your regular diet.  Drink plenty of fluids but you should avoid alcoholic beverages for 24 hours.  ACTIVITY:  You should plan to take it easy for the rest of today and you should NOT DRIVE or use heavy machinery until tomorrow (because of the sedation  medicines used during the test).    FOLLOW UP: Our staff will call the number listed on your records 48-72 hours following your procedure to check on you and address any questions or concerns that you may have regarding the information given to you following your procedure. If we do not reach you, we will leave a message.  We will attempt to reach you two times.  During this call, we will ask if you have developed any symptoms of COVID 19. If you develop any symptoms (ie: fever, flu-like symptoms, shortness of breath, cough etc.) before then, please call [phone].  If you test positive for Covid 19 in the 2 weeks post procedure, please call and report this information to [place].    If any biopsies were taken you will be contacted by phone or by letter within the next 1-3 weeks.  Please call us at [phone] if you have not heard about the biopsies in 3 weeks.    SIGNATURES/CONFIDENTIALITY: You and/or your care partner have signed paperwork which will be entered into your electronic medical record.  These signatures attest to the fact that that the information above on your After Visit Summary has been reviewed and is understood.  Full responsibility of the confidentiality of this discharge information lies with you and/or your care-partner.

## 2019-11-27 ENCOUNTER — Telehealth: Payer: Self-pay

## 2019-11-27 ENCOUNTER — Encounter: Payer: Self-pay | Admitting: Gastroenterology

## 2019-11-27 NOTE — Telephone Encounter (Signed)
  Follow up Call-  Call back number 11/25/2019 08/21/2017  Post procedure Call Back phone  # [phone]  Permission to leave phone message Yes Yes  Some recent data might be hidden     Patient questions:  Do you have a fever, pain , or abdominal swelling? No. Pain Score  0 *  Have you tolerated food without any problems? Yes.    Have you been able to return to your normal activities? Yes.    Do you have any questions about your discharge instructions: Diet   No. Medications  No. Follow up visit  No.  Do you have questions or concerns about your Care? No.  Actions: * If pain score is 4 or above: No action needed, pain <4.  Pt answered negative to COVID screening questions.

## 2019-12-02 ENCOUNTER — Encounter: Payer: Self-pay | Admitting: Family Medicine

## 2020-03-28 ENCOUNTER — Other Ambulatory Visit: Payer: Self-pay | Admitting: Family Medicine

## 2020-03-28 DIAGNOSIS — F324 Major depressive disorder, single episode, in partial remission: Secondary | ICD-10-CM

## 2020-03-30 MED ORDER — FLUOXETINE HCL 10 MG PO TABS: 10.0000 mg | ORAL_TABLET | Freq: Every day | ORAL | 0 refills | Status: DC

## 2020-04-03 ENCOUNTER — Encounter: Payer: Self-pay | Admitting: Family Medicine

## 2020-04-03 DIAGNOSIS — K219 Gastro-esophageal reflux disease without esophagitis: Secondary | ICD-10-CM

## 2020-04-03 MED ORDER — HYDROCORTISONE (PERIANAL) 2.5 % EX CREA: 1.0000 "application " | TOPICAL_CREAM | Freq: Two times a day (BID) | CUTANEOUS | 1 refills | Status: DC | PRN

## 2020-04-03 MED ORDER — PANTOPRAZOLE SODIUM 40 MG PO TBEC: 40.0000 mg | DELAYED_RELEASE_TABLET | Freq: Every day | ORAL | 3 refills | Status: DC

## 2020-04-14 DIAGNOSIS — D229 Melanocytic nevi, unspecified: Secondary | ICD-10-CM | POA: Diagnosis not present

## 2020-04-14 DIAGNOSIS — L570 Actinic keratosis: Secondary | ICD-10-CM | POA: Diagnosis not present

## 2020-04-14 DIAGNOSIS — D225 Melanocytic nevi of trunk: Secondary | ICD-10-CM | POA: Diagnosis not present

## 2020-04-14 DIAGNOSIS — L821 Other seborrheic keratosis: Secondary | ICD-10-CM | POA: Diagnosis not present

## 2020-04-14 DIAGNOSIS — D485 Neoplasm of uncertain behavior of skin: Secondary | ICD-10-CM | POA: Diagnosis not present

## 2020-04-14 DIAGNOSIS — Z85828 Personal history of other malignant neoplasm of skin: Secondary | ICD-10-CM | POA: Diagnosis not present

## 2020-04-14 DIAGNOSIS — C44712 Basal cell carcinoma of skin of right lower limb, including hip: Secondary | ICD-10-CM | POA: Diagnosis not present

## 2020-04-14 DIAGNOSIS — D1801 Hemangioma of skin and subcutaneous tissue: Secondary | ICD-10-CM | POA: Diagnosis not present

## 2020-04-14 DIAGNOSIS — L905 Scar conditions and fibrosis of skin: Secondary | ICD-10-CM | POA: Diagnosis not present

## 2020-04-14 DIAGNOSIS — L989 Disorder of the skin and subcutaneous tissue, unspecified: Secondary | ICD-10-CM | POA: Diagnosis not present

## 2020-04-24 DIAGNOSIS — C4401 Basal cell carcinoma of skin of lip: Secondary | ICD-10-CM | POA: Diagnosis not present

## 2020-04-27 ENCOUNTER — Encounter: Payer: Self-pay | Admitting: Family Medicine

## 2020-04-27 NOTE — Telephone Encounter (Signed)
Appt rescheduled

## 2020-04-28 ENCOUNTER — Encounter: Payer: Medicare Other | Admitting: Family Medicine

## 2020-05-05 DIAGNOSIS — C44712 Basal cell carcinoma of skin of right lower limb, including hip: Secondary | ICD-10-CM | POA: Diagnosis not present

## 2020-05-06 ENCOUNTER — Other Ambulatory Visit: Payer: Self-pay

## 2020-05-06 DIAGNOSIS — E785 Hyperlipidemia, unspecified: Secondary | ICD-10-CM

## 2020-05-06 MED ORDER — EZETIMIBE 10 MG PO TABS: ORAL_TABLET | ORAL | 0 refills | Status: DC

## 2020-05-27 DIAGNOSIS — Z1231 Encounter for screening mammogram for malignant neoplasm of breast: Secondary | ICD-10-CM | POA: Diagnosis not present

## 2020-05-27 LAB — HM MAMMOGRAPHY

## 2020-06-06 ENCOUNTER — Other Ambulatory Visit: Payer: Self-pay | Admitting: Family Medicine

## 2020-06-06 DIAGNOSIS — E785 Hyperlipidemia, unspecified: Secondary | ICD-10-CM

## 2020-06-16 DIAGNOSIS — L728 Other follicular cysts of the skin and subcutaneous tissue: Secondary | ICD-10-CM | POA: Diagnosis not present

## 2020-06-16 DIAGNOSIS — L905 Scar conditions and fibrosis of skin: Secondary | ICD-10-CM | POA: Diagnosis not present

## 2020-06-16 DIAGNOSIS — I788 Other diseases of capillaries: Secondary | ICD-10-CM | POA: Diagnosis not present

## 2020-06-16 DIAGNOSIS — Z85828 Personal history of other malignant neoplasm of skin: Secondary | ICD-10-CM | POA: Diagnosis not present

## 2020-06-16 DIAGNOSIS — L821 Other seborrheic keratosis: Secondary | ICD-10-CM | POA: Diagnosis not present

## 2020-07-13 ENCOUNTER — Other Ambulatory Visit: Payer: Self-pay | Admitting: Family Medicine

## 2020-07-13 DIAGNOSIS — F324 Major depressive disorder, single episode, in partial remission: Secondary | ICD-10-CM

## 2020-07-20 ENCOUNTER — Ambulatory Visit (INDEPENDENT_AMBULATORY_CARE_PROVIDER_SITE_OTHER): Payer: Medicare PPO | Admitting: Family Medicine

## 2020-07-20 ENCOUNTER — Other Ambulatory Visit: Payer: Self-pay

## 2020-07-20 ENCOUNTER — Encounter: Payer: Self-pay | Admitting: Family Medicine

## 2020-07-20 VITALS — BP 116/78 | HR 82 | Temp 98.3°F | Resp 18 | Ht 63.0 in | Wt 118.6 lb

## 2020-07-20 DIAGNOSIS — E785 Hyperlipidemia, unspecified: Secondary | ICD-10-CM | POA: Diagnosis not present

## 2020-07-20 DIAGNOSIS — Z0000 Encounter for general adult medical examination without abnormal findings: Secondary | ICD-10-CM | POA: Diagnosis not present

## 2020-07-20 DIAGNOSIS — M21612 Bunion of left foot: Secondary | ICD-10-CM

## 2020-07-20 DIAGNOSIS — M21611 Bunion of right foot: Secondary | ICD-10-CM | POA: Diagnosis not present

## 2020-07-20 DIAGNOSIS — G4700 Insomnia, unspecified: Secondary | ICD-10-CM | POA: Diagnosis not present

## 2020-07-20 DIAGNOSIS — B070 Plantar wart: Secondary | ICD-10-CM | POA: Diagnosis not present

## 2020-07-20 MED ORDER — EZETIMIBE 10 MG PO TABS: ORAL_TABLET | ORAL | 1 refills | Status: DC

## 2020-07-20 MED ORDER — ZOLPIDEM TARTRATE 5 MG PO TABS: 5.0000 mg | ORAL_TABLET | Freq: Every evening | ORAL | 1 refills | Status: DC | PRN

## 2020-07-20 NOTE — Progress Notes (Signed)
Subjective:     Samantha Miranda is a 74 y.o. female and is here for a comprehensive physical exam. The patient reports "callous "on bottom of L foot which is causing pain .    Social History   Socioeconomic History  . Marital status: Married    Spouse name: Not on file  . Number of children: 1  . Years of education: Not on file  . Highest education level: Not on file  Occupational History  . 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 Smoking status: Former Smoker    Packs/day: 1.00    Years: 40.00    Pack years: 40.00    Quit date: 07/2011    Years since quitting: 9.0  . Smokeless tobacco: Never Used  . Tobacco comment: Pt currently using lowest strength vapes.  11/02/2015  Vaping Use  . Vaping Use: Every day  . Substances: Nicotine  Substance and Sexual Activity  . Alcohol use: No    Alcohol/week: 0.0 standard drinks  . Drug use: No  . Sexual activity: Yes  Other Topics Concern  . Not on file  Social History Narrative   Exercise-no   Social Determinants of Health   Financial Resource Strain:   . Difficulty of Paying Living Expenses: Not on file  Food Insecurity:   . Worried About Charity fundraiser in the Last Year: Not on file  . Ran Out of Food in the Last Year: Not on file  Transportation Needs:   . Lack of Transportation (Medical): Not on file  . Lack of Transportation (Non-Medical): Not on file  Physical Activity:   . Days of Exercise per Week: Not on file  . Minutes of Exercise per Session: Not on file  Stress:   . Feeling of Stress : Not on file  Social Connections:   . Frequency of Communication with Friends and Family: Not on file  . Frequency of Social Gatherings with Friends and Family: Not on file  . Attends Religious Services: Not on file  . Active Member of Clubs or Organizations: Not on file  . Attends Archivist Meetings: Not on file  . Marital Status: Not on file  Intimate Partner  Violence:   . Fear of Current or Ex-Partner: Not on file  . Emotionally Abused: Not on file  . Physically Abused: Not on file  . Sexually Abused: Not on file   Health Maintenance  Topic Date Due  . INFLUENZA VACCINE  06/21/2020  . TETANUS/TDAP  08/26/2020  . MAMMOGRAM  05/27/2021  . COVID-19 Vaccine  Completed  . Hepatitis C Screening  Completed  . PNA vac Low Risk Adult  Completed  . DEXA SCAN  Addressed    The following portions of the patient's history were reviewed and updated as appropriate:  She  has a past medical history of Barrett's esophagus, Basal cell carcinoma of left lower leg, Breast cancer ([place]), Gastric ulcer, GERD (gastroesophageal reflux disease), History of colon polyps, Hyperlipidemia, and Osteopenia. She does not have any pertinent problems on file. She  has a past surgical history that includes Abdominal hysterectomy (1991); Breast lumpectomy (9/07); Cataract extraction, bilateral; and Eye surgery (08/2013  10/2013). Her family history includes Aortic stenosis in her mother; Breast cancer (age of onset: 103) in her sister; Colon cancer in her maternal grandfather; Diabetes in her maternal grandmother; Heart failure in her mother; Hyperlipidemia in her mother; Lung cancer (  age of onset: 70) in her father. She  reports that she quit smoking about 9 years ago. She has a 40.00 pack-year smoking history. She has never used smokeless tobacco. She reports that she does not drink alcohol and does not use drugs. She has a current medication list which includes the following prescription(s): vitamin d, coenzyme q10, collagenase, ezetimibe, fluoxetine, fluticasone, hydrocortisone, magnesium, misc natural products, multiple vitamins-minerals, OVER THE COUNTER MEDICATION, OVER THE COUNTER MEDICATION, OVER THE COUNTER MEDICATION, pantoprazole, xiidra, and zolpidem. Current Outpatient Medications on File Prior to Visit  Medication Sig Dispense Refill  . Cholecalciferol (VITAMIN D)  2000 UNITS tablet Take 4,000 Units by mouth daily.    . Coenzyme Q10 (COQ10 PO) Take 1 each by mouth daily.    . Collagenase POWD by Does not apply route.    Marland Kitchen FLUoxetine (PROZAC) 10 MG tablet TAKE 1 TABLET(10 MG) BY MOUTH DAILY 90 tablet 0  . fluticasone (FLONASE) 50 MCG/ACT nasal spray Place 2 sprays into both nostrils daily. 16 g 5  . hydrocortisone (ANUSOL-HC) 2.5 % rectal cream Place 1 application rectally 2 (two) times daily as needed for hemorrhoids or anal itching. 30 g 1  . MAGNESIUM PO Take 1 each by mouth daily.    . Misc Natural Products (CHOLESTEROL SUPPORT PO) Take by mouth.    . Multiple Vitamins-Minerals (MULTIVITAMIN ADULTS PO) Take 1 tablet by mouth daily.    Marland Kitchen OVER THE COUNTER MEDICATION Take 1 capsule by mouth daily. Cholesterol solution vitamin    . OVER THE COUNTER MEDICATION Take 2 capsules by mouth daily. Lung and bronchial support vitamin    . OVER THE COUNTER MEDICATION Take 2 capsules by mouth daily. Omega Q Plus vitamin    . pantoprazole (PROTONIX) 40 MG tablet Take 1 tablet (40 mg total) by mouth daily before breakfast. 90 tablet 3  . XIIDRA 5 % SOLN instill 1 drop into both eyes every 12 hours  1   No current facility-administered medications on file prior to visit.   She is allergic to amoxicillin-pot clavulanate and moxifloxacin..  Review of Systems Review of Systems  Constitutional: Negative for activity change, appetite change and fatigue.  HENT: Negative for hearing loss, congestion, tinnitus and ear discharge.  dentist q90m Eyes: Negative for visual disturbance (see optho q1y -- vision corrected to 20/20 with glasses).  Respiratory: Negative for cough, chest tightness and shortness of breath.   Cardiovascular: Negative for chest pain, palpitations and leg swelling.  Gastrointestinal: Negative for abdominal pain, diarrhea, constipation and abdominal distention.  Genitourinary: Negative for urgency, frequency, decreased urine volume and difficulty  urinating.  Musculoskeletal: Negative for back pain, arthralgias and gait problem.  Skin: Negative for color change, pallor and rash.  Neurological: Negative for dizziness, light-headedness, numbness and headaches.  Hematological: Negative for adenopathy. Does not bruise/bleed easily.  Psychiatric/Behavioral: Negative for suicidal ideas, confusion, sleep disturbance, self-injury, dysphoric mood, decreased concentration and agitation.       Objective:    BP 116/78 (BP Location: Left Arm, Patient Position: Sitting, Cuff Size: Normal)   Pulse 82   Temp 98.3 F (36.8 C) (Oral)   Resp 18   Ht 5\' 3"  (1.6 m)   Wt 118 lb 9.6 oz (53.8 kg)   SpO2 95%   BMI 21.01 kg/m  General appearance: alert, cooperative, appears stated age and no distress Head: Normocephalic, without obvious abnormality, atraumatic Eyes: negative findings: lids and lashes normal, conjunctivae and sclerae normal and pupils equal, round, reactive to light and accomodation  Ears: normal TM's and external ear canals both ears Neck: no adenopathy, no carotid bruit, no JVD, supple, symmetrical, trachea midline and thyroid not enlarged, symmetric, no tenderness/mass/nodules Back: symmetric, no curvature. ROM normal. No CVA tenderness. Lungs: clear to auscultation bilaterally Breasts: normal appearance, no masses or tenderness Heart: regular rate and rhythm, S1, S2 normal, no murmur, click, rub or gallop Abdomen: soft, non-tender; bowel sounds normal; no masses,  no organomegaly Pelvic: not indicated; status post hysterectomy, negative ROS Extremities: extremities normal, atraumatic, no cyanosis or edema--- plantar wart L foot ,  B/l bunions  Pulses: 2+ and symmetric Skin: mult moles,  scar on face from recent mole removal ,healing well Lymph nodes: Cervical, supraclavicular, and axillary nodes normal. Neurologic: Alert and oriented X 3, normal strength and tone. Normal symmetric reflexes. Normal coordination and gait     Assessment:    Healthy female exam.      Plan:    ghm utd Check labs  See After Visit Summary for Counseling Recommendations    1. Plantar wart of left foot  - Ambulatory referral to Podiatry  2. Bilateral bunions  - Ambulatory referral to Podiatry  3. Hyperlipidemia, unspecified hyperlipidemia type Encouraged heart healthy diet, increase exercise, avoid trans fats, consider a krill oil cap daily  - ezetimibe (ZETIA) 10 MG tablet; TAKE 1 TABLET(10 MG) BY MOUTH DAILY  Dispense: 90 tablet; Refill: 1 - Lipid panel - Comprehensive metabolic panel  4. Insomnia, unspecified type  - zolpidem (AMBIEN) 5 MG tablet; Take 1 tablet (5 mg total) by mouth at bedtime as needed for sleep.  Dispense: 15 tablet; Refill: 1  5. Preventative health care See above

## 2020-07-20 NOTE — Patient Instructions (Signed)
Preventive Care 38 Years and Older, Female Preventive care refers to lifestyle choices and visits with your health care provider that can promote health and wellness. This includes:  A yearly physical exam. This is also called an annual well check.  Regular dental and eye exams.  Immunizations.  Screening for certain conditions.  Healthy lifestyle choices, such as diet and exercise. What can I expect for my preventive care visit? Physical exam Your health care provider will check:  Height and weight. These may be used to calculate body mass index (BMI), which is a measurement that tells if you are at a healthy weight.  Heart rate and blood pressure.  Your skin for abnormal spots. Counseling Your health care provider may ask you questions about:  Alcohol, tobacco, and drug use.  Emotional well-being.  Home and relationship well-being.  Sexual activity.  Eating habits.  History of falls.  Memory and ability to understand (cognition).  Work and work Statistician.  Pregnancy and menstrual history. What immunizations do I need?  Influenza (flu) vaccine  This is recommended every year. Tetanus, diphtheria, and pertussis (Tdap) vaccine  You may need a Td booster every 10 years. Varicella (chickenpox) vaccine  You may need this vaccine if you have not already been vaccinated. Zoster (shingles) vaccine  You may need this after age 73. Pneumococcal conjugate (PCV13) vaccine  One dose is recommended after age 73. Pneumococcal polysaccharide (PPSV23) vaccine  One dose is recommended after age 73. Measles, mumps, and rubella (MMR) vaccine  You may need at least one dose of MMR if you were born in 1957 or later. You may also need a second dose. Meningococcal conjugate (MenACWY) vaccine  You may need this if you have certain conditions. Hepatitis A vaccine  You may need this if you have certain conditions or if you travel or work in places where you may be exposed  to hepatitis A. Hepatitis B vaccine  You may need this if you have certain conditions or if you travel or work in places where you may be exposed to hepatitis B. Haemophilus influenzae type b (Hib) vaccine  You may need this if you have certain conditions. You may receive vaccines as individual doses or as more than one vaccine together in one shot (combination vaccines). Talk with your health care provider about the risks and benefits of combination vaccines. What tests do I need? Blood tests  Lipid and cholesterol levels. These may be checked every 5 years, or more frequently depending on your overall health.  Hepatitis C test.  Hepatitis B test. Screening  Lung cancer screening. You may have this screening every year starting at age 73 if you have a 30-pack-year history of smoking and currently smoke or have quit within the past 15 years.  Colorectal cancer screening. All adults should have this screening starting at age 73 and continuing until age 15. Your health care provider may recommend screening at age 73 if you are at increased risk. You will have tests every 1-10 years, depending on your results and the type of screening test.  Diabetes screening. This is done by checking your blood sugar (glucose) after you have not eaten for a while (fasting). You may have this done every 1-3 years.  Mammogram. This may be done every 1-2 years. Talk with your health care provider about how often you should have regular mammograms.  BRCA-related cancer screening. This may be done if you have a family history of breast, ovarian, tubal, or peritoneal cancers.  Other tests  Sexually transmitted disease (STD) testing.  Bone density scan. This is done to screen for osteoporosis. You may have this done starting at age 73. Follow these instructions at home: Eating and drinking  Eat a diet that includes fresh fruits and vegetables, whole grains, lean protein, and low-fat dairy products. Limit  your intake of foods with high amounts of sugar, saturated fats, and salt.  Take vitamin and mineral supplements as recommended by your health care provider.  Do not drink alcohol if your health care provider tells you not to drink.  If you drink alcohol: ? Limit how much you have to 0-1 drink a day. ? Be aware of how much alcohol is in your drink. In the [place] one drink equals one 12 oz bottle of beer (355 mL), one 5 oz glass of wine (148 mL), or one 1 oz glass of hard liquor (44 mL). Lifestyle  Take daily care of your teeth and gums.  Stay active. Exercise for at least 30 minutes on 5 or more days each week.  Do not use any products that contain nicotine or tobacco, such as cigarettes, e-cigarettes, and chewing tobacco. If you need help quitting, ask your health care provider.  If you are sexually active, practice safe sex. Use a condom or other form of protection in order to prevent STIs (sexually transmitted infections).  Talk with your health care provider about taking a low-dose aspirin or statin. What's next?  Go to your health care provider once a year for a well check visit.  Ask your health care provider how often you should have your eyes and teeth checked.  Stay up to date on all vaccines. This information is not intended to replace advice given to you by your health care provider. Make sure you discuss any questions you have with your health care provider. Document Revised: 11/01/2018 Document Reviewed: 11/01/2018 Elsevier Patient Education  2020 [employer].

## 2020-07-21 ENCOUNTER — Ambulatory Visit (INDEPENDENT_AMBULATORY_CARE_PROVIDER_SITE_OTHER)
Admission: RE | Admit: 2020-07-21 | Discharge: 2020-07-21 | Disposition: A | Discharge status: Home / Self Care | Payer: Medicare PPO | Source: Ambulatory Visit | Attending: Acute Care | Admitting: Acute Care

## 2020-07-21 DIAGNOSIS — Z122 Encounter for screening for malignant neoplasm of respiratory organs: Secondary | ICD-10-CM

## 2020-07-21 DIAGNOSIS — Z87891 Personal history of nicotine dependence: Secondary | ICD-10-CM

## 2020-07-21 LAB — LIPID PANEL
Cholesterol: 217 mg/dL — ABNORMAL HIGH (ref <200)
HDL: 67 mg/dL (ref >=50)
LDL Cholesterol (Calc): 132 mg/dL (calc) — ABNORMAL HIGH
Non-HDL Cholesterol (Calc): 150 mg/dL (calc) — ABNORMAL HIGH (ref <130)
Total CHOL/HDL Ratio: 3.2 (calc) (ref <5.0)
Triglycerides: 81 mg/dL (ref <150)

## 2020-07-21 LAB — COMPREHENSIVE METABOLIC PANEL
AG Ratio: 1.6 (calc) (ref 1.0–2.5)
ALT: 20 U/L (ref 6–29)
AST: 26 U/L (ref 10–35)
Albumin: 4.2 g/dL (ref 3.6–5.1)
Alkaline phosphatase (APISO): 56 U/L (ref 37–153)
BUN: 15 mg/dL (ref 7–25)
CO2: 29 mmol/L (ref 20–32)
Calcium: 9.4 mg/dL (ref 8.6–10.4)
Chloride: 101 mmol/L (ref 98–110)
Creat: 0.82 mg/dL (ref 0.60–0.93)
Globulin: 2.7 g/dL (calc) (ref 1.9–3.7)
Glucose, Bld: 89 mg/dL (ref 65–99)
Potassium: 4.2 mmol/L (ref 3.5–5.3)
Sodium: 138 mmol/L (ref 135–146)
Total Bilirubin: 0.6 mg/dL (ref 0.2–1.2)
Total Protein: 6.9 g/dL (ref 6.1–8.1)

## 2020-07-22 ENCOUNTER — Other Ambulatory Visit: Payer: Self-pay | Admitting: Family Medicine

## 2020-07-22 ENCOUNTER — Encounter: Payer: Self-pay | Admitting: Family Medicine

## 2020-07-22 DIAGNOSIS — E785 Hyperlipidemia, unspecified: Secondary | ICD-10-CM

## 2020-07-22 NOTE — Telephone Encounter (Signed)
vytorin is zetia and zocor We can add zocor which is generic 20 mg #30 1 po qhs, 2 refills

## 2020-07-23 ENCOUNTER — Telehealth: Payer: Self-pay | Admitting: Family Medicine

## 2020-07-23 NOTE — Telephone Encounter (Signed)
Pt didn't want to start statin but wants to discuss options. Please advise options

## 2020-07-23 NOTE — Telephone Encounter (Signed)
See telephone note- Pt requesting call from [facility].

## 2020-07-23 NOTE — Telephone Encounter (Signed)
Patient states she spoke to [place] previous about Crestor. She declined it, however she wants to discuss other options and request for Heather to call her back. Thanks

## 2020-07-23 NOTE — Telephone Encounter (Signed)
As posted in her labs,  diet , fish oil , fish --- -but statin is standard of care  She an come in to discuss more if needed

## 2020-07-24 ENCOUNTER — Other Ambulatory Visit: Payer: Self-pay

## 2020-07-24 ENCOUNTER — Ambulatory Visit (INDEPENDENT_AMBULATORY_CARE_PROVIDER_SITE_OTHER): Payer: Medicare PPO

## 2020-07-24 ENCOUNTER — Ambulatory Visit (INDEPENDENT_AMBULATORY_CARE_PROVIDER_SITE_OTHER): Payer: Medicare PPO | Admitting: Podiatry

## 2020-07-24 ENCOUNTER — Encounter: Payer: Self-pay | Admitting: Podiatry

## 2020-07-24 DIAGNOSIS — M2011 Hallux valgus (acquired), right foot: Secondary | ICD-10-CM

## 2020-07-24 DIAGNOSIS — M2012 Hallux valgus (acquired), left foot: Secondary | ICD-10-CM

## 2020-07-24 DIAGNOSIS — M779 Enthesopathy, unspecified: Secondary | ICD-10-CM

## 2020-07-24 DIAGNOSIS — M2042 Other hammer toe(s) (acquired), left foot: Secondary | ICD-10-CM

## 2020-07-24 DIAGNOSIS — M2041 Other hammer toe(s) (acquired), right foot: Secondary | ICD-10-CM | POA: Diagnosis not present

## 2020-07-24 NOTE — Progress Notes (Signed)
Subjective:   Patient ID: Samantha Miranda, female   DOB: 74 y.o.   MRN: [id number]   HPI Patient presents stating she is had a lot of fluid around the left outside of her foot and she has bad structural bunion deformity right over left with hammertoe deformity second digit which can become bothersome especially on the right foot.  Has modified shoe gear and has tried other modalities and patient does not smoke likes to be active if possible.  The area on the left is been present for several months with a lesion that she treated herself   Review of Systems  All other systems reviewed and are negative.       Objective:  Physical Exam Vitals and nursing note reviewed.  Constitutional:      Appearance: She is well-developed.  Pulmonary:     Effort: Pulmonary effort is normal.  Musculoskeletal:        General: Normal range of motion.  Skin:    General: Skin is warm.  Neurological:     Mental Status: She is alert.     Neurovascular status found to be intact muscle strength found to be adequate range of motion within normal limits.  Patient is found to have inflammation and fluid around the fifth MPJ left that is painful when pressed and make shoe gear difficult.  There is no open lesion or any other areas of pathology no proximal edema erythema drainage noted.  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  H&P x-rays reviewed and today for the left due to the fluid accumulation in the joint surface I did do sterile prep and injected the plantar capsule 2 mg dexamethasone Kenalog 5 mg Xylocaine advised on rigid bottom shoes and for the right we discussed  structural correction with distal osteotomy and rotational aspect of the hallux.  I did explain the toe still may lift some and she should continue to wear offloading shoe gear and if she decides to have it corrected it can be done  X-rays indicate that there is significant structural deformity right left over foot with elevation metatarsal angle deviation hallux with inflammation around the fifth MPJ left     Plan:  Listed above

## 2020-07-24 NOTE — Telephone Encounter (Signed)
Called patient. Left VM to call back for concerns but advised that statin was standard of care.

## 2020-07-24 NOTE — Telephone Encounter (Signed)
Patient called back never got a call from heather. Suggested an appt. Patient understood but didn't schedule

## 2020-07-30 NOTE — Progress Notes (Signed)
Please call patient and let them  know their  low dose Ct was read as a Lung RADS 2: nodules that are benign in appearance and behavior with a very low likelihood of becoming a clinically active cancer due to size or lack of growth. Recommendation per radiology is for a repeat LDCT in 12 months. .Please let them  know we will order and schedule their  annual screening scan for 07/2021. Please let them  know there was notation of CAD on their  scan.  Please remind the patient  that this is a non-gated exam therefore degree or severity of disease  cannot be determined. Please have them  follow up with their PCP regarding potential risk factor modification, dietary therapy or pharmacologic therapy if clinically indicated. Pt.  Is not  currently on statin therapy. Please place order for annual  screening scan for 07/2021 and fax results to PCP. Thanks so much.

## 2020-08-06 DIAGNOSIS — Z853 Personal history of malignant neoplasm of breast: Secondary | ICD-10-CM | POA: Diagnosis not present

## 2020-08-06 DIAGNOSIS — Z803 Family history of malignant neoplasm of breast: Secondary | ICD-10-CM | POA: Diagnosis not present

## 2020-08-06 DIAGNOSIS — Z8249 Family history of ischemic heart disease and other diseases of the circulatory system: Secondary | ICD-10-CM | POA: Diagnosis not present

## 2020-08-06 DIAGNOSIS — E785 Hyperlipidemia, unspecified: Secondary | ICD-10-CM | POA: Diagnosis not present

## 2020-08-06 DIAGNOSIS — R030 Elevated blood-pressure reading, without diagnosis of hypertension: Secondary | ICD-10-CM | POA: Diagnosis not present

## 2020-08-06 DIAGNOSIS — J309 Allergic rhinitis, unspecified: Secondary | ICD-10-CM | POA: Diagnosis not present

## 2020-08-06 DIAGNOSIS — K219 Gastro-esophageal reflux disease without esophagitis: Secondary | ICD-10-CM | POA: Diagnosis not present

## 2020-08-06 DIAGNOSIS — F339 Major depressive disorder, recurrent, unspecified: Secondary | ICD-10-CM | POA: Diagnosis not present

## 2020-08-06 DIAGNOSIS — H04129 Dry eye syndrome of unspecified lacrimal gland: Secondary | ICD-10-CM | POA: Diagnosis not present

## 2020-08-07 ENCOUNTER — Other Ambulatory Visit: Payer: Self-pay | Admitting: *Deleted

## 2020-08-07 DIAGNOSIS — Z87891 Personal history of nicotine dependence: Secondary | ICD-10-CM

## 2020-10-12 ENCOUNTER — Encounter: Payer: Self-pay | Admitting: Family Medicine

## 2020-10-12 ENCOUNTER — Other Ambulatory Visit: Payer: Self-pay | Admitting: Family Medicine

## 2020-10-12 DIAGNOSIS — F324 Major depressive disorder, single episode, in partial remission: Secondary | ICD-10-CM

## 2020-10-30 ENCOUNTER — Ambulatory Visit: Payer: Self-pay | Admitting: *Deleted

## 2020-10-30 ENCOUNTER — Other Ambulatory Visit: Payer: Self-pay

## 2020-10-30 ENCOUNTER — Other Ambulatory Visit (INDEPENDENT_AMBULATORY_CARE_PROVIDER_SITE_OTHER): Payer: Medicare PPO

## 2020-10-30 DIAGNOSIS — E785 Hyperlipidemia, unspecified: Secondary | ICD-10-CM | POA: Diagnosis not present

## 2020-10-30 LAB — COMPREHENSIVE METABOLIC PANEL
ALT: 17 U/L (ref 0–35)
AST: 25 U/L (ref 0–37)
Albumin: 4.2 g/dL (ref 3.5–5.2)
Alkaline Phosphatase: 55 U/L (ref 39–117)
BUN: 20 mg/dL (ref 6–23)
CO2: 29 mEq/L (ref 19–32)
Calcium: 9.3 mg/dL (ref 8.4–10.5)
Chloride: 99 mEq/L (ref 96–112)
Creatinine, Ser: 0.89 mg/dL (ref 0.40–1.20)
GFR: 64.22 mL/min (ref >60.00)
Glucose, Bld: 84 mg/dL (ref 70–99)
Potassium: 4 mEq/L (ref 3.5–5.1)
Sodium: 137 mEq/L (ref 135–145)
Total Bilirubin: 0.4 mg/dL (ref 0.2–1.2)
Total Protein: 7.2 g/dL (ref 6.0–8.3)

## 2020-10-30 LAB — LIPID PANEL
Cholesterol: 262 mg/dL — ABNORMAL HIGH (ref 0–200)
HDL: 66.5 mg/dL (ref >39.00)
LDL Cholesterol: 176 mg/dL — ABNORMAL HIGH (ref 0–99)
NonHDL: 195.62
Total CHOL/HDL Ratio: 4
Triglycerides: 99 mg/dL (ref 0.0–149.0)
VLDL: 19.8 mg/dL (ref 0.0–40.0)

## 2020-10-30 NOTE — Progress Notes (Signed)
Ab o

## 2020-11-02 ENCOUNTER — Other Ambulatory Visit: Payer: Self-pay | Admitting: Family Medicine

## 2020-11-02 DIAGNOSIS — E785 Hyperlipidemia, unspecified: Secondary | ICD-10-CM

## 2020-11-04 ENCOUNTER — Other Ambulatory Visit: Payer: Self-pay

## 2020-11-04 MED ORDER — ROSUVASTATIN CALCIUM 10 MG PO TABS: 10.0000 mg | ORAL_TABLET | Freq: Every day | ORAL | 2 refills | Status: DC

## 2020-11-05 ENCOUNTER — Encounter: Payer: Self-pay | Admitting: Family Medicine

## 2020-11-05 NOTE — Telephone Encounter (Signed)
Yes she should take both.

## 2020-12-03 ENCOUNTER — Ambulatory Visit: Payer: Medicare PPO

## 2021-01-06 DIAGNOSIS — H04123 Dry eye syndrome of bilateral lacrimal glands: Secondary | ICD-10-CM | POA: Diagnosis not present

## 2021-01-18 ENCOUNTER — Ambulatory Visit: Payer: Medicare PPO | Admitting: Family Medicine

## 2021-01-18 ENCOUNTER — Encounter: Payer: Self-pay | Admitting: Family Medicine

## 2021-01-18 ENCOUNTER — Other Ambulatory Visit: Payer: Self-pay

## 2021-01-18 VITALS — BP 128/70 | HR 79 | Temp 97.9°F | Resp 18 | Ht 63.0 in | Wt 120.0 lb

## 2021-01-18 DIAGNOSIS — E785 Hyperlipidemia, unspecified: Secondary | ICD-10-CM

## 2021-01-18 LAB — COMPREHENSIVE METABOLIC PANEL
ALT: 13 U/L (ref 0–35)
AST: 20 U/L (ref 0–37)
Albumin: 4.2 g/dL (ref 3.5–5.2)
Alkaline Phosphatase: 47 U/L (ref 39–117)
BUN: 13 mg/dL (ref 6–23)
CO2: 28 mEq/L (ref 19–32)
Calcium: 9.4 mg/dL (ref 8.4–10.5)
Chloride: 103 mEq/L (ref 96–112)
Creatinine, Ser: 0.89 mg/dL (ref 0.40–1.20)
GFR: 64.12 mL/min (ref >60.00)
Glucose, Bld: 78 mg/dL (ref 70–99)
Potassium: 4 mEq/L (ref 3.5–5.1)
Sodium: 140 mEq/L (ref 135–145)
Total Bilirubin: 0.5 mg/dL (ref 0.2–1.2)
Total Protein: 6.9 g/dL (ref 6.0–8.3)

## 2021-01-18 LAB — LIPID PANEL
Cholesterol: 243 mg/dL — ABNORMAL HIGH (ref 0–200)
HDL: 66.5 mg/dL (ref >39.00)
LDL Cholesterol: 155 mg/dL — ABNORMAL HIGH (ref 0–99)
NonHDL: 176.61
Total CHOL/HDL Ratio: 4
Triglycerides: 108 mg/dL (ref 0.0–149.0)
VLDL: 21.6 mg/dL (ref 0.0–40.0)

## 2021-01-18 NOTE — Patient Instructions (Signed)
Preventing High Cholesterol Cholesterol is a white, waxy substance similar to fat that the human body needs to help build cells. The liver makes all the cholesterol that a person's body needs. Having high cholesterol (hypercholesterolemia) increases your risk for heart disease and stroke. Extra or excess cholesterol comes from the food that you eat. High cholesterol can often be prevented with diet and lifestyle changes. If you already have high cholesterol, you can control it with diet, lifestyle changes, and medicines. How can high cholesterol affect me? If you have high cholesterol, fatty deposits (plaques) may build up on the walls of your blood vessels. The blood vessels that carry blood away from your heart are called arteries. Plaques make the arteries narrower and stiffer. This in turn can:  Restrict or block blood flow and cause blood clots to form.  Increase your risk for heart attack and stroke. What can increase my risk for high cholesterol? This condition is more likely to develop in people who:  Eat foods that are high in saturated fat or cholesterol. Saturated fat is mostly found in foods that come from animal sources.  Are overweight.  Are not getting enough exercise.  Have a family history of high cholesterol (familial hypercholesterolemia). What actions can I take to prevent this? Nutrition  Eat less saturated fat.  Avoid trans fats (partially hydrogenated oils). These are often found in margarine and in some baked goods, fried foods, and snacks bought in packages.  Avoid precooked or cured meat, such as bacon, sausages, or meat loaves.  Avoid foods and drinks that have added sugars.  Eat more fruits, vegetables, and whole grains.  Choose healthy sources of protein, such as fish, poultry, lean cuts of red meat, beans, peas, lentils, and nuts.  Choose healthy sources of fat, such as: ? Nuts. ? Vegetable oils, especially olive oil. ? Fish that have healthy fats,  such as omega-3 fatty acids. These fish include mackerel or salmon.   Lifestyle  Lose weight if you are overweight. Maintaining a healthy body mass index (BMI) can help prevent or control high cholesterol. It can also lower your risk for diabetes and high blood pressure. Ask your health care provider to help you with a diet and exercise plan to lose weight safely.  Do not use any products that contain nicotine or tobacco, such as cigarettes, e-cigarettes, and chewing tobacco. If you need help quitting, ask your health care provider. Alcohol use  Do not drink alcohol if: ? Your health care provider tells you not to drink. ? You are pregnant, may be pregnant, or are planning to become pregnant.  If you drink alcohol: ? Limit how much you use to:  0-1 drink a day for women.  0-2 drinks a day for men. ? Be aware of how much alcohol is in your drink. In the [place] one drink equals one 12 oz bottle of beer (355 mL), one 5 oz glass of wine (148 mL), or one 1 oz glass of hard liquor (44 mL). Activity  Get enough exercise. Do exercises as told by your health care provider.  Each week, do at least 150 minutes of exercise that takes a medium level of effort (moderate-intensity exercise). This kind of exercise: ? Makes your heart beat faster while allowing you to still be able to talk. ? Can be done in short sessions several times a day or longer sessions a few times a week. For example, on 5 days each week, you could walk fast or ride   your bike 3 times a day for 10 minutes each time.   Medicines  Your health care provider may recommend medicines to help lower cholesterol. This may be a medicine to lower the amount of cholesterol that your liver makes. You may need medicine if: ? Diet and lifestyle changes have not lowered your cholesterol enough. ? You have high cholesterol and other risk factors for heart disease or stroke.  Take over-the-counter and prescription medicines only as told by your  health care provider. General information  Manage your risk factors for high cholesterol. Talk with your health care provider about all your risk factors and how to lower your risk.  Manage other conditions that you have, such as diabetes or high blood pressure (hypertension).  Have blood tests to check your cholesterol levels at regular points in time as told by your health care provider.  Keep all follow-up visits as told by your health care provider. This is important. Where to find more information  American Heart Association: www.heart.org  National Heart, Lung, and Blood Institute: www.nhlbi.nih.gov Summary  High cholesterol increases your risk for heart disease and stroke. By keeping your cholesterol level low, you can reduce your risk for these conditions.  High cholesterol can often be prevented with diet and lifestyle changes.  Work with your health care provider to manage your risk factors, and have your blood tested regularly. This information is not intended to replace advice given to you by your health care provider. Make sure you discuss any questions you have with your health care provider. Document Revised: 08/20/2019 Document Reviewed: 08/20/2019 Elsevier Patient Education  2021 Elsevier Inc.  

## 2021-01-18 NOTE — Progress Notes (Signed)
Patient ID: Samantha Miranda, female    DOB: 07-Mar-1946  Age: 75 y.o. MRN: [id number]    Subjective:  Subjective  HPI Samantha Miranda presents for f/u cholesterol--- she was unable to take the crestor --it caused severe muscle aches.     Review of Systems  Constitutional: Negative for appetite change, diaphoresis, fatigue and unexpected weight change.  Eyes: Negative for pain, redness and visual disturbance.  Respiratory: Negative for cough, chest tightness, shortness of breath and wheezing.   Cardiovascular: Negative for chest pain, palpitations and leg swelling.  Endocrine: Negative for cold intolerance, heat intolerance, polydipsia, polyphagia and polyuria.  Genitourinary: Negative for difficulty urinating, dysuria and frequency.  Musculoskeletal: Positive for myalgias.  Neurological: Negative for dizziness, light-headedness, numbness and headaches.    History Past Medical History:  Diagnosis Date  . Barrett's esophagus   . Basal cell carcinoma of left lower leg   . Breast cancer ([place])    right  . Gastric ulcer   . GERD (gastroesophageal reflux disease)   . History of colon polyps   . Hyperlipidemia   . Osteopenia     She has a past surgical history that includes Abdominal hysterectomy (1991); Breast lumpectomy (9/07); Cataract extraction, bilateral; and Eye surgery (08/2013  10/2013).   Her family history includes Aortic stenosis in her mother; Breast cancer (age of onset: 32) in her sister; Colon cancer in her maternal grandfather; Diabetes in her maternal grandmother; Heart failure in her mother; Hyperlipidemia in her mother; Lung cancer (age of onset: 50) in her father.She reports that she quit smoking about 9 years ago. She has a 40.00 pack-year smoking history. She has never used smokeless tobacco. She reports that she does not drink alcohol and does not use drugs.  Current Outpatient Medications on File Prior to Visit  Medication Sig Dispense Refill  . Cholecalciferol  (VITAMIN D) 2000 UNITS tablet Take 4,000 Units by mouth daily.    . Coenzyme Q10 (COQ10 PO) Take 1 each by mouth daily.    . Collagenase POWD by Does not apply route.    . ezetimibe (ZETIA) 10 MG tablet TAKE 1 TABLET(10 MG) BY MOUTH DAILY 90 tablet 1  . FLUoxetine (PROZAC) 10 MG tablet Take 1 tablet (10 mg total) by mouth daily. 90 tablet 1  . fluticasone (FLONASE) 50 MCG/ACT nasal spray Place 2 sprays into both nostrils daily. 16 g 5  . hydrocortisone (ANUSOL-HC) 2.5 % rectal cream Place 1 application rectally 2 (two) times daily as needed for hemorrhoids or anal itching. 30 g 1  . MAGNESIUM PO Take 1 each by mouth daily.    . Misc Natural Products (CHOLESTEROL SUPPORT PO) Take by mouth.    . Multiple Vitamins-Minerals (MULTIVITAMIN ADULTS PO) Take 1 tablet by mouth daily.    Marland Kitchen OVER THE COUNTER MEDICATION Take 1 capsule by mouth daily. Cholesterol solution vitamin    . OVER THE COUNTER MEDICATION Take 2 capsules by mouth daily. Lung and bronchial support vitamin    . OVER THE COUNTER MEDICATION Take 2 capsules by mouth daily. Omega Q Plus vitamin    . pantoprazole (PROTONIX) 40 MG tablet Take 1 tablet (40 mg total) by mouth daily before breakfast. 90 tablet 3  . XIIDRA 5 % SOLN instill 1 drop into both eyes every 12 hours  1  . zolpidem (AMBIEN) 5 MG tablet Take 1 tablet (5 mg total) by mouth at bedtime as needed for sleep. 15 tablet 1   No current facility-administered medications on  file prior to visit.     Objective:  Objective  Physical Exam Vitals and nursing note reviewed.  Constitutional:      Appearance: She is well-developed and well-nourished.  HENT:     Head: Normocephalic and atraumatic.  Eyes:     Extraocular Movements: EOM normal.     Conjunctiva/sclera: Conjunctivae normal.  Neck:     Thyroid: No thyromegaly.     Vascular: No carotid bruit or JVD.  Cardiovascular:     Rate and Rhythm: Normal rate and regular rhythm.     Heart sounds: Normal heart sounds. No murmur  heard.   Pulmonary:     Effort: Pulmonary effort is normal. No respiratory distress.     Breath sounds: Normal breath sounds. No wheezing or rales.  Chest:     Chest wall: No tenderness.  Musculoskeletal:        General: No edema.     Cervical back: Normal range of motion and neck supple.  Neurological:     Mental Status: She is alert and oriented to person, place, and time.  Psychiatric:        Mood and Affect: Mood and affect normal.    BP 128/70 (BP Location: Right Arm, Patient Position: Sitting, Cuff Size: Normal)   Pulse 79   Temp 97.9 F (36.6 C) (Oral)   Resp 18   Ht 5\' 3"  (1.6 m)   Wt 120 lb (54.4 kg)   SpO2 99%   BMI 21.26 kg/m  Wt Readings from Last 3 Encounters:  01/18/21 120 lb (54.4 kg)  07/20/20 118 lb 9.6 oz (53.8 kg)  11/25/19 120 lb (54.4 kg)     Lab Results  Component Value Date   WBC 5.7 08/04/2017   HGB 13.0 08/04/2017   HCT 38.5 08/04/2017   PLT 210 08/04/2017   GLUCOSE 84 10/30/2020   CHOL 262 (H) 10/30/2020   TRIG 99.0 10/30/2020   HDL 66.50 10/30/2020   LDLDIRECT 176.2 08/12/2013   LDLCALC 176 (H) 10/30/2020   ALT 17 10/30/2020   AST 25 10/30/2020   NA 137 10/30/2020   K 4.0 10/30/2020   CL 99 10/30/2020   CREATININE 0.89 10/30/2020   BUN 20 10/30/2020   CO2 29 10/30/2020   TSH 1.07 08/04/2017   HGBA1C 5.1 08/04/2017   MICROALBUR 1.1 08/12/2013    CT CHEST LUNG CA SCREEN LOW DOSE W/O CM  Result Date: 07/22/2020 CLINICAL DATA:  Former smoker, quit October 2016, 39 pack-year history. EXAM: CT CHEST WITHOUT CONTRAST LOW-DOSE FOR LUNG CANCER SCREENING TECHNIQUE: Multidetector CT imaging of the chest was performed following the standard protocol without IV contrast. COMPARISON:  07/19/2019. FINDINGS: Cardiovascular: Atherosclerotic calcification of the aorta, aortic valve and coronary arteries. Heart size normal. No pericardial effusion. Mediastinum/Nodes: No pathologically enlarged mediastinal or axillary lymph nodes. Hilar regions are  difficult to definitively evaluate without IV contrast. Esophagus is grossly unremarkable. Lungs/Pleura: Centrilobular and paraseptal emphysema. Scattered chronic peribronchovascular nodularity. Pulmonary nodules measure 4.5 mm or less in size, similar. Some have resolved in the interval. No pleural fluid. Adherent debris in the airway. Upper Abdomen: Visualized portions of the liver, adrenal glands, kidneys, spleen, pancreas, stomach and bowel are grossly unremarkable. Musculoskeletal: Degenerative changes in the spine. No worrisome lytic or sclerotic lesions. IMPRESSION: 1. Lung-RADS 2, benign appearance or behavior. Continue annual screening with low-dose chest CT without contrast in 12 months. 2. Aortic atherosclerosis (ICD10-I70.0). Coronary artery calcification. 3.  Emphysema (ICD10-J43.9). Electronically Signed   By: Lorin Picket  M.D.   On: 07/22/2020 09:03     Assessment & Plan:  Plan  I have discontinued Samantha Miranda's rosuvastatin. I am also having her maintain her Vitamin D, MAGNESIUM PO, Coenzyme Q10 (COQ10 PO), Multiple Vitamins-Minerals (MULTIVITAMIN ADULTS PO), Collagenase, Misc Natural Products (CHOLESTEROL SUPPORT PO), Xiidra, OVER THE COUNTER MEDICATION, OVER THE COUNTER MEDICATION, OVER THE COUNTER MEDICATION, fluticasone, pantoprazole, hydrocortisone, ezetimibe, zolpidem, and FLUoxetine.  No orders of the defined types were placed in this encounter.   Problem List Items Addressed This Visit      Unprioritized   Hyperlipidemia - Primary   Relevant Orders   Lipid panel   Comprehensive metabolic panel    consider livalo or lipid clinic after labs come back con't zetia   Follow-up: Return in about 6 months (around 07/18/2021), or if symptoms worsen or fail to improve, for annual exam, fasting.  Ann Held, DO   -

## 2021-01-20 ENCOUNTER — Other Ambulatory Visit: Payer: Self-pay | Admitting: Family Medicine

## 2021-01-20 DIAGNOSIS — E785 Hyperlipidemia, unspecified: Secondary | ICD-10-CM

## 2021-02-01 ENCOUNTER — Encounter: Payer: Self-pay | Admitting: Family Medicine

## 2021-02-01 DIAGNOSIS — F324 Major depressive disorder, single episode, in partial remission: Secondary | ICD-10-CM

## 2021-02-01 DIAGNOSIS — E785 Hyperlipidemia, unspecified: Secondary | ICD-10-CM

## 2021-02-01 MED ORDER — LIVALO 1 MG PO TABS: 1.0000 mg | ORAL_TABLET | Freq: Every day | ORAL | 2 refills | Status: DC

## 2021-02-01 NOTE — Telephone Encounter (Signed)
Please advise 

## 2021-02-01 NOTE — Telephone Encounter (Signed)
Take with zetia

## 2021-02-02 ENCOUNTER — Telehealth: Payer: Self-pay

## 2021-02-02 MED ORDER — FLUOXETINE HCL 10 MG PO TABS: 10.0000 mg | ORAL_TABLET | Freq: Every day | ORAL | 1 refills | Status: DC

## 2021-02-02 MED ORDER — EZETIMIBE 10 MG PO TABS: ORAL_TABLET | ORAL | 1 refills | Status: DC

## 2021-02-02 NOTE — Telephone Encounter (Signed)
PA started for Livalo via CoverMyMeds.   Key ;[id number]  PA Case ID: [id number]  Rx #: [id number]

## 2021-02-02 NOTE — Addendum Note (Signed)
Addended by: Sanda Linger on: 02/02/2021 08:07 AM   Modules accepted: Orders

## 2021-02-02 NOTE — Telephone Encounter (Signed)
PA was denied today.

## 2021-02-02 NOTE — Telephone Encounter (Signed)
I cant read what s in the box its too small but d/c livalo and try zetia 10 mg #30  1 po qd #30 2 refills  Let pt know please

## 2021-02-04 ENCOUNTER — Encounter: Payer: Self-pay | Admitting: Family Medicine

## 2021-02-04 NOTE — Telephone Encounter (Signed)
Left a message to return call.  

## 2021-02-08 ENCOUNTER — Telehealth: Payer: Self-pay

## 2021-02-08 ENCOUNTER — Other Ambulatory Visit: Payer: Self-pay

## 2021-02-08 DIAGNOSIS — E785 Hyperlipidemia, unspecified: Secondary | ICD-10-CM

## 2021-02-08 MED ORDER — ZYPITAMAG 2 MG PO TABS: 2.0000 mg | ORAL_TABLET | Freq: Every day | ORAL | 2 refills | Status: DC

## 2021-02-08 MED ORDER — ZYPITAMAG 1 MG PO TABS: 1.0000 mg | ORAL_TABLET | Freq: Every day | ORAL | 2 refills | Status: DC

## 2021-02-08 NOTE — Telephone Encounter (Signed)
2

## 2021-02-08 NOTE — Telephone Encounter (Signed)
Try to send in the generic then and see what happens

## 2021-02-08 NOTE — Telephone Encounter (Signed)
Im confused zypitamag is livalo

## 2021-02-08 NOTE — Telephone Encounter (Signed)
See MyChart messages.

## 2021-02-08 NOTE — Telephone Encounter (Signed)
Pharmacy called stating that they can fill either 2 or 4 mg of the Zypitamag. Which dose would you prefer?

## 2021-02-08 NOTE — Telephone Encounter (Signed)
New dose sent to pharmacy. 

## 2021-02-26 ENCOUNTER — Encounter: Payer: Self-pay | Admitting: Family Medicine

## 2021-02-26 NOTE — Telephone Encounter (Signed)
1 mg is fine ---  we can recheck labs in 2-3 months

## 2021-03-31 ENCOUNTER — Encounter: Payer: Self-pay | Admitting: Family Medicine

## 2021-03-31 DIAGNOSIS — K219 Gastro-esophageal reflux disease without esophagitis: Secondary | ICD-10-CM

## 2021-03-31 MED ORDER — PANTOPRAZOLE SODIUM 40 MG PO TBEC: 40.0000 mg | DELAYED_RELEASE_TABLET | Freq: Every day | ORAL | 3 refills | Status: DC

## 2021-05-17 ENCOUNTER — Other Ambulatory Visit (INDEPENDENT_AMBULATORY_CARE_PROVIDER_SITE_OTHER): Payer: Medicare PPO

## 2021-05-17 ENCOUNTER — Encounter: Payer: Self-pay | Admitting: Family Medicine

## 2021-05-17 ENCOUNTER — Other Ambulatory Visit: Payer: Self-pay

## 2021-05-17 ENCOUNTER — Ambulatory Visit: Payer: Medicare PPO | Admitting: Family Medicine

## 2021-05-17 VITALS — BP 120/78 | HR 81 | Temp 97.9°F | Ht 63.5 in | Wt 121.2 lb

## 2021-05-17 DIAGNOSIS — E785 Hyperlipidemia, unspecified: Secondary | ICD-10-CM

## 2021-05-17 DIAGNOSIS — K120 Recurrent oral aphthae: Secondary | ICD-10-CM | POA: Diagnosis not present

## 2021-05-17 LAB — COMPREHENSIVE METABOLIC PANEL
ALT: 18 U/L (ref 0–35)
AST: 25 U/L (ref 0–37)
Albumin: 4.2 g/dL (ref 3.5–5.2)
Alkaline Phosphatase: 50 U/L (ref 39–117)
BUN: 17 mg/dL (ref 6–23)
CO2: 25 mEq/L (ref 19–32)
Calcium: 9.2 mg/dL (ref 8.4–10.5)
Chloride: 105 mEq/L (ref 96–112)
Creatinine, Ser: 0.94 mg/dL (ref 0.40–1.20)
GFR: 59.91 mL/min — ABNORMAL LOW (ref >60.00)
Glucose, Bld: 86 mg/dL (ref 70–99)
Potassium: 4.4 mEq/L (ref 3.5–5.1)
Sodium: 139 mEq/L (ref 135–145)
Total Bilirubin: 0.5 mg/dL (ref 0.2–1.2)
Total Protein: 6.8 g/dL (ref 6.0–8.3)

## 2021-05-17 LAB — LIPID PANEL
Cholesterol: 156 mg/dL (ref 0–200)
HDL: 67 mg/dL (ref >39.00)
LDL Cholesterol: 75 mg/dL (ref 0–99)
NonHDL: 88.6
Total CHOL/HDL Ratio: 2
Triglycerides: 69 mg/dL (ref 0.0–149.0)
VLDL: 13.8 mg/dL (ref 0.0–40.0)

## 2021-05-17 MED ORDER — LIDOCAINE VISCOUS HCL 2 % MT SOLN: 5.0000 mL | OROMUCOSAL | 0 refills | Status: DC | PRN

## 2021-05-17 MED ORDER — ZYPITAMAG 2 MG PO TABS: 1.0000 mg | ORAL_TABLET | Freq: Every day | ORAL | 2 refills | Status: DC

## 2021-05-17 NOTE — Patient Instructions (Signed)
Give [place] 2-3 business days to get the results of your labs back.   Keep the diet clean and stay active.  Let me know if things aren't better in the next 10-14 days on MyChart.  Let us know if you need anything.

## 2021-05-17 NOTE — Progress Notes (Signed)
Chief Complaint  Patient presents with   Follow-up    Inside of mouth swollen    Samantha Miranda is a 75 y.o. female here for a skin complaint.  Duration: 1 week Location: L side of roof of mouth Pruritic? No Painful? Yes Drainage? No No injury. Other associated symptoms: hurts when she eats Therapies tried thus far: Salt water, Vick's Vaporub, natural salve; nothing has helped thus far  Hyperlipidemia Patient presents for hyperlipidemia follow up. Currently being treated with pitavastatin 1 mg/d and compliance with treatment thus far has been good. She denies myalgias at the above dosage, gets aches at 2 mg/d. Marland Kitchen She is adhering to a healthy diet. The patient is not known to have coexisting coronary artery disease.  Past Medical History:  Diagnosis Date   Barrett's esophagus    Basal cell carcinoma of left lower leg    Breast cancer ([facility])    right   Gastric ulcer    GERD (gastroesophageal reflux disease)    History of colon polyps    Hyperlipidemia    Osteopenia     BP 120/78   Pulse 81   Temp 97.9 F (36.6 C) (Oral)   Ht 5' 3.5" (1.613 m)   Wt 121 lb 4 oz (55 kg)   SpO2 95%   BMI 21.14 kg/m  Gen: awake, alert, appearing stated age Lungs: No accessory muscle use Mouth: Roof of mouth with aphthous ulcer on anterior L hard palate with small rim of erythema; no drainage or elevation Psych: Age appropriate judgment and insight  Aphthous ulcer of mouth - Plan: lidocaine (XYLOCAINE) 2 % solution  Hyperlipidemia, unspecified hyperlipidemia type - Plan: Pitavastatin Magnesium (ZYPITAMAG) 2 MG TABS  Should be self resolving. PO lidocaine, don't swallow.  She actually takes 1 mg/d. Chart updated. Here for labs.  F/u prn. The patient voiced understanding and agreement to the plan.  [place], DO 05/17/21 10:54 AM

## 2021-05-25 ENCOUNTER — Ambulatory Visit (INDEPENDENT_AMBULATORY_CARE_PROVIDER_SITE_OTHER): Payer: Medicare PPO

## 2021-05-25 VITALS — Ht 63.5 in | Wt 121.0 lb

## 2021-05-25 DIAGNOSIS — Z0000 Encounter for general adult medical examination without abnormal findings: Secondary | ICD-10-CM | POA: Diagnosis not present

## 2021-05-25 NOTE — Patient Instructions (Signed)
Samantha Miranda , Thank you for taking time to complete your Medicare Wellness Visit. I appreciate your ongoing commitment to your health goals. Please review the following plan we discussed and let me know if I can assist you in the future.   Screening recommendations/referrals: Colonoscopy: No longer required Mammogram: Scheduled for 06/03/2021 Bone Density: Declined. Please call the office to schedule if you change your mind. Recommended yearly ophthalmology/optometry visit for glaucoma screening and checkup Recommended yearly dental visit for hygiene and checkup  Vaccinations: Influenza vaccine: Up to date Pneumococcal vaccine: Up to date Tdap vaccine: Up to date-Due 04/2031 Shingles vaccine: Completed vaccines   Covid-19:Up to date  Advanced directives: Information mailed today  Conditions/risks identified: See problem list  Next appointment: Follow up in one year for your annual wellness visit 05/31/2022 @ 11:40   Preventive Care 65 Years and Older, Female Preventive care refers to lifestyle choices and visits with your health care provider that can promote health and wellness. What does preventive care include? A yearly physical exam. This is also called an annual well check. Dental exams once or twice a year. Routine eye exams. Ask your health care provider how often you should have your eyes checked. Personal lifestyle choices, including: Daily care of your teeth and gums. Regular physical activity. Eating a healthy diet. Avoiding tobacco and drug use. Limiting alcohol use. Practicing safe sex. Taking low-dose aspirin every day. Taking vitamin and mineral supplements as recommended by your health care provider. What happens during an annual well check? The services and screenings done by your health care provider during your annual well check will depend on your age, overall health, lifestyle risk factors, and family history of disease. Counseling  Your health care provider  may ask you questions about your: Alcohol use. Tobacco use. Drug use. Emotional well-being. Home and relationship well-being. Sexual activity. Eating habits. History of falls. Memory and ability to understand (cognition). Work and work Statistician. Reproductive health. Screening  You may have the following tests or measurements: Height, weight, and BMI. Blood pressure. Lipid and cholesterol levels. These may be checked every 5 years, or more frequently if you are over 40 years old. Skin check. Lung cancer screening. You may have this screening every year starting at age 9 if you have a 30-pack-year history of smoking and currently smoke or have quit within the past 15 years. Fecal occult blood test (FOBT) of the stool. You may have this test every year starting at age 76. Flexible sigmoidoscopy or colonoscopy. You may have a sigmoidoscopy every 5 years or a colonoscopy every 10 years starting at age 31. Hepatitis C blood test. Hepatitis B blood test. Sexually transmitted disease (STD) testing. Diabetes screening. This is done by checking your blood sugar (glucose) after you have not eaten for a while (fasting). You may have this done every 1-3 years. Bone density scan. This is done to screen for osteoporosis. You may have this done starting at age 75. Mammogram. This may be done every 1-2 years. Talk to your health care provider about how often you should have regular mammograms. Talk with your health care provider about your test results, treatment options, and if necessary, the need for more tests. Vaccines  Your health care provider may recommend certain vaccines, such as: Influenza vaccine. This is recommended every year. Tetanus, diphtheria, and acellular pertussis (Tdap, Td) vaccine. You may need a Td booster every 10 years. Zoster vaccine. You may need this after age 53. Pneumococcal 13-valent conjugate (PCV13) vaccine.  One dose is recommended after age 10. Pneumococcal  polysaccharide (PPSV23) vaccine. One dose is recommended after age 59. Talk to your health care provider about which screenings and vaccines you need and how often you need them. This information is not intended to replace advice given to you by your health care provider. Make sure you discuss any questions you have with your health care provider. Document Released: 12/04/2015 Document Revised: 07/27/2016 Document Reviewed: 09/08/2015 Elsevier Interactive Patient Education  2017 [employer] Prevention in the Home Falls can cause injuries. They can happen to people of all ages. There are many things you can do to make your home safe and to help prevent falls. What can I do on the outside of my home? Regularly fix the edges of walkways and driveways and fix any cracks. Remove anything that might make you trip as you walk through a door, such as a raised step or threshold. Trim any bushes or trees on the path to your home. Use bright outdoor lighting. Clear any walking paths of anything that might make someone trip, such as rocks or tools. Regularly check to see if handrails are loose or broken. Make sure that both sides of any steps have handrails. Any raised decks and porches should have guardrails on the edges. Have any leaves, snow, or ice cleared regularly. Use sand or salt on walking paths during winter. Clean up any spills in your garage right away. This includes oil or grease spills. What can I do in the bathroom? Use night lights. Install grab bars by the toilet and in the tub and shower. Do not use towel bars as grab bars. Use non-skid mats or decals in the tub or shower. If you need to sit down in the shower, use a plastic, non-slip stool. Keep the floor dry. Clean up any water that spills on the floor as soon as it happens. Remove soap buildup in the tub or shower regularly. Attach bath mats securely with double-sided non-slip rug tape. Do not have throw rugs and other  things on the floor that can make you trip. What can I do in the bedroom? Use night lights. Make sure that you have a light by your bed that is easy to reach. Do not use any sheets or blankets that are too big for your bed. They should not hang down onto the floor. Have a firm chair that has side arms. You can use this for support while you get dressed. Do not have throw rugs and other things on the floor that can make you trip. What can I do in the kitchen? Clean up any spills right away. Avoid walking on wet floors. Keep items that you use a lot in easy-to-reach places. If you need to reach something above you, use a strong step stool that has a grab bar. Keep electrical cords out of the way. Do not use floor polish or wax that makes floors slippery. If you must use wax, use non-skid floor wax. Do not have throw rugs and other things on the floor that can make you trip. What can I do with my stairs? Do not leave any items on the stairs. Make sure that there are handrails on both sides of the stairs and use them. Fix handrails that are broken or loose. Make sure that handrails are as long as the stairways. Check any carpeting to make sure that it is firmly attached to the stairs. Fix any carpet that is loose or  worn. Avoid having throw rugs at the top or bottom of the stairs. If you do have throw rugs, attach them to the floor with carpet tape. Make sure that you have a light switch at the top of the stairs and the bottom of the stairs. If you do not have them, ask someone to add them for you. What else can I do to help prevent falls? Wear shoes that: Do not have high heels. Have rubber bottoms. Are comfortable and fit you well. Are closed at the toe. Do not wear sandals. If you use a stepladder: Make sure that it is fully opened. Do not climb a closed stepladder. Make sure that both sides of the stepladder are locked into place. Ask someone to hold it for you, if possible. Clearly  mark and make sure that you can see: Any grab bars or handrails. First and last steps. Where the edge of each step is. Use tools that help you move around (mobility aids) if they are needed. These include: Canes. Walkers. Scooters. Crutches. Turn on the lights when you go into a dark area. Replace any light bulbs as soon as they burn out. Set up your furniture so you have a clear path. Avoid moving your furniture around. If any of your floors are uneven, fix them. If there are any pets around you, be aware of where they are. Review your medicines with your doctor. Some medicines can make you feel dizzy. This can increase your chance of falling. Ask your doctor what other things that you can do to help prevent falls. This information is not intended to replace advice given to you by your health care provider. Make sure you discuss any questions you have with your health care provider. Document Released: 09/03/2009 Document Revised: 04/14/2016 Document Reviewed: 12/12/2014 Elsevier Interactive Patient Education  2017 [employer].

## 2021-05-25 NOTE — Progress Notes (Signed)
Subjective:   Samantha Miranda is a 75 y.o. female who presents for Medicare Annual (Subsequent) preventive examination.  I connected with Amee today by telephone and verified that I am speaking with the correct person using two identifiers. Location patient: home Location provider: work Persons participating in the virtual visit: patient, Marine scientist.    I discussed the limitations, risks, security and privacy concerns of performing an evaluation and management service by telephone and the availability of in person appointments. I also discussed with the patient that there may be a patient responsible charge related to this service. The patient expressed understanding and verbally consented to this telephonic visit.    Interactive audio and video telecommunications were attempted between this provider and patient, however failed, due to patient having technical difficulties OR patient did not have access to video capability.  We continued and completed visit with audio only.  Some vital signs may be absent or patient reported.   Time Spent with patient on telephone encounter: 20 minutes   Review of Systems     Cardiac Risk Factors include: advanced age (>20men, >92 women);dyslipidemia     Objective:    Today's Vitals   05/25/21 1141  Weight: 121 lb (54.9 kg)  Height: 5' 3.5" (1.613 m)   Body mass index is 21.1 kg/m.  Advanced Directives 05/25/2021 10/29/2019 12/19/2017 08/21/2017 05/19/2016 03/19/2016 11/02/2015  Does Patient Have a Medical Advance Directive? No No No No Yes Yes No  Type of Advance Directive - - - - Press photographer;Living will [place];Living will -  Does patient want to make changes to medical advance directive? - - - - No - Patient declined - -  Copy of [place] in Chart? - - - - No - copy requested No - copy requested -  Would patient like information on creating a medical advance directive? Yes  (MAU/Ambulatory/Procedural Areas - Information given) No - Patient declined Yes (MAU/Ambulatory/Procedural Areas - Information given) - - - No - patient declined information    Current Medications (verified) Outpatient Encounter Medications as of 05/25/2021  Medication Sig   Cholecalciferol (VITAMIN D) 2000 UNITS tablet Take 4,000 Units by mouth daily.   Coenzyme Q10 (COQ10 PO) Take 1 each by mouth daily.   Collagenase POWD by Does not apply route.   ezetimibe (ZETIA) 10 MG tablet TAKE 1 TABLET(10 MG) BY MOUTH DAILY   FLUoxetine (PROZAC) 10 MG tablet Take 1 tablet (10 mg total) by mouth daily.   fluticasone (FLONASE) 50 MCG/ACT nasal spray Place 2 sprays into both nostrils daily.   hydrocortisone (ANUSOL-HC) 2.5 % rectal cream Place 1 application rectally 2 (two) times daily as needed for hemorrhoids or anal itching.   lidocaine (XYLOCAINE) 2 % solution Use as directed 5-10 mLs in the mouth or throat every 4 (four) hours as needed for mouth pain. Do not swallow.   MAGNESIUM PO Take 1 each by mouth daily.   Misc Natural Products (CHOLESTEROL SUPPORT PO) Take by mouth.   Multiple Vitamins-Minerals (MULTIVITAMIN ADULTS PO) Take 1 tablet by mouth daily.   OVER THE COUNTER MEDICATION Take 1 capsule by mouth daily. Cholesterol solution vitamin   OVER THE COUNTER MEDICATION Take 2 capsules by mouth daily. Lung and bronchial support vitamin   OVER THE COUNTER MEDICATION Take 2 capsules by mouth daily. Omega Q Plus vitamin   pantoprazole (PROTONIX) 40 MG tablet Take 1 tablet (40 mg total) by mouth daily before breakfast.   Shirley Friar  5 % SOLN instill 1 drop into both eyes every 12 hours   zolpidem (AMBIEN) 5 MG tablet Take 1 tablet (5 mg total) by mouth at bedtime as needed for sleep.   Pitavastatin Magnesium (ZYPITAMAG) 2 MG TABS Take 1 mg by mouth daily. (Patient not taking: Reported on 05/25/2021)   No facility-administered encounter medications on file as of 05/25/2021.    Allergies  (verified) Amoxicillin-pot clavulanate and Moxifloxacin   History: Past Medical History:  Diagnosis Date   Barrett's esophagus    Basal cell carcinoma of left lower leg    Breast cancer (HCC)    right   Gastric ulcer    GERD (gastroesophageal reflux disease)    History of colon polyps    Hyperlipidemia    Osteopenia    Past Surgical History:  Procedure Laterality Date   ABDOMINAL HYSTERECTOMY  1991   BREAST LUMPECTOMY  9/07   CATARACT EXTRACTION, BILATERAL     EYE SURGERY  08/2013  10/2013   cataracts   Family History  Problem Relation Age of Onset   Heart failure Mother        CHF   Hyperlipidemia Mother    Aortic stenosis Mother    Lung cancer Father 26   Breast cancer Sister 86   Colon cancer Maternal Grandfather    Diabetes Maternal Grandmother    Heart attack Neg Hx    Hypertension Neg Hx    Sudden death Neg Hx    Esophageal cancer Neg Hx    Stomach cancer Neg Hx    Rectal cancer Neg Hx    Social History   Socioeconomic History   Marital status: Married    Spouse name: Not on file   Number of children: 1   Years of education: Not on file   Highest education level: Not on file  Occupational History   Occupation: retired    Fish farm manager: [employer]    Comment: retired  Tobacco Use   Smoking status: Former    Packs/day: 1.00    Years: 40.00    Pack years: 40.00    Types: Cigarettes    Quit date: 07/2011    Years since quitting: 9.8   Smokeless tobacco: Never   Tobacco comments:    Pt currently using lowest strength vapes.  11/02/2015  Vaping Use   Vaping Use: Former   Substances: Nicotine  Substance and Sexual Activity   Alcohol use: No    Alcohol/week: 0.0 standard drinks   Drug use: No   Sexual activity: Yes    Partners: Male  Other Topics Concern   Not on file  Social History Narrative   Exercise-no   Social Determinants of Health   Financial Resource Strain: Low Risk    Difficulty of Paying Living Expenses: Not hard at all   Food Insecurity: No Food Insecurity   Worried About Charity fundraiser in the Last Year: Never true   Arboriculturist in the Last Year: Never true  Transportation Needs: No Transportation Needs   Lack of Transportation (Medical): No   Lack of Transportation (Non-Medical): No  Physical Activity: Sufficiently Active   Days of Exercise per Week: 6 days   Minutes of Exercise per Session: 30 min  Stress: No Stress Concern Present   Feeling of Stress : Not at all  Social Connections: Moderately Isolated   Frequency of Communication with Friends and Family: More than three times a week   Frequency of Social Gatherings  with Friends and Family: More than three times a week   Attends Religious Services: Never   Active Member of Clubs or Organizations: No   Attends Archivist Meetings: Never   Marital Status: Married    Tobacco Counseling Counseling given: Not Answered Tobacco comments: Pt currently using lowest strength vapes.  11/02/2015   Clinical Intake:  Pre-visit preparation completed: Yes  Pain : No/denies pain     Nutritional Status: BMI of 19-24  Normal Nutritional Risks: None Diabetes: No  How often do you need to have someone help you when you read instructions, pamphlets, or other written materials from your doctor or pharmacy?: 1 - Never  Diabetic?No  Interpreter Needed?: No  Information entered by :: Caroleen Hamman LPN   Activities of Daily Living In your present state of health, do you have any difficulty performing the following activities: 05/25/2021 01/18/2021  Hearing? N N  Vision? N N  Difficulty concentrating or making decisions? N N  Walking or climbing stairs? N Y  Dressing or bathing? N N  Doing errands, shopping? N N  Preparing Food and eating ? N -  Using the Toilet? N -  In the past six months, have you accidently leaked urine? N -  Do you have problems with loss of bowel control? N -  Managing your Medications? N -  Managing your  Finances? N -  Housekeeping or managing your Housekeeping? N -  Some recent data might be hidden    Patient Care Team: Carollee Herter, Alferd Apa, DO as PCP - General Shirley Muscat Loreen Freud, MD as Referring Physician (Optometry) Izora Ribas (Dermatology)  Indicate any recent Medical Services you may have received from other than [facility] providers in the past year (date may be approximate).     Assessment:   This is a routine wellness examination for [place].  Hearing/Vision screen Hearing Screening - Comments:: Wears hearing aid in right ear Vision Screening - Comments:: Last eye exam-2022-Dr. Truman Hayward  Dietary issues and exercise activities discussed: Current Exercise Habits: Home exercise routine, Type of exercise: strength training/weights (hula hoop), Time (Minutes): 20, Frequency (Times/Week): 6, Weekly Exercise (Minutes/Week): 120, Intensity: Mild, Exercise limited by: None identified   Goals Addressed               This Visit's Progress     Patient Stated     Remain active and independent.   (pt-stated)   On track     Continue with current routine.         Other     DIET - INCREASE WATER INTAKE   On track      Depression Screen PHQ 2/9 Scores 05/25/2021 01/18/2021 07/20/2020 10/29/2019 04/24/2018 12/19/2017 06/05/2017  PHQ - 2 Score 0 0 0 0 0 0 0  PHQ- 9 Score - - - - - - -  Exception Documentation - - - - - - -    Fall Risk Fall Risk  05/25/2021 01/18/2021 10/29/2019 12/19/2017 06/05/2017  Falls in the past year? 0 0 0 No No  Number falls in past yr: 0 0 - - -  Injury with Fall? 0 0 - - -  Follow up Falls prevention discussed Falls evaluation completed Education provided;Falls prevention discussed - -    FALL RISK PREVENTION PERTAINING TO THE HOME:  Any stairs in or around the home? Yes  If so, are there any without handrails? Yes  Home free of loose throw rugs in walkways, pet beds, electrical cords, etc? Yes  Adequate lighting in your home to reduce risk of falls? Yes    ASSISTIVE DEVICES UTILIZED TO PREVENT FALLS:  Life alert? No  Use of a cane, walker or w/c? No  Grab bars in the bathroom? Yes  Shower chair or bench in shower? No  Elevated toilet seat or a handicapped toilet? No   TIMED UP AND GO:  Was the test performed? No . Phone visit   Cognitive Function:Normal cognitive status assessed by this Nurse Health Advisor. No abnormalities found.   MMSE - Mini Mental State Exam 12/19/2017  Orientation to time 5  Orientation to Place 5  Registration 3  Attention/ Calculation 5  Recall 3  Language- name 2 objects 2  Language- repeat 1  Language- follow 3 step command 3  Language- read & follow direction 1  Write a sentence 1  Copy design 1  Total score 30        Immunizations Immunization History  Administered Date(s) Administered   Fluad Quad(high Dose 65+) 10/29/2019   Influenza Split 09/22/2011   Influenza, High Dose Seasonal PF 10/10/2014, 11/02/2015, 08/26/2017   Influenza-Unspecified 08/26/2017, 09/17/2018, 09/16/2020   PFIZER(Purple Top)SARS-COV-2 Vaccination 01/25/2020, 02/15/2020, 09/16/2020   Pneumococcal Conjugate-13 04/21/2015   Pneumococcal Polysaccharide-23 03/03/2014   Td 08/26/2010, 05/05/2021   Zoster Recombinat (Shingrix) 06/11/2017, 08/26/2017   Zoster, Live 04/02/2009    TDAP status: Due, Education has been provided regarding the importance of this vaccine. Advised may receive this vaccine at local pharmacy or Health Dept. Aware to provide a copy of the vaccination record if obtained from local pharmacy or Health Dept. Verbalized acceptance and understanding.  Flu Vaccine status: Up to date  Pneumococcal vaccine status: Up to date  Covid-19 vaccine status: Completed vaccines  Qualifies for Shingles Vaccine? No   Zostavax completed Yes   Shingrix Completed?: Yes  Screening Tests Health Maintenance  Topic Date Due   COVID-19 Vaccine (4 - Booster for Pfizer series) 01/17/2021   MAMMOGRAM  05/27/2021    INFLUENZA VACCINE  06/21/2021   TETANUS/TDAP  05/06/2031   Hepatitis C Screening  Completed   PNA vac Low Risk Adult  Completed   Zoster Vaccines- Shingrix  Completed   DEXA SCAN  Addressed   HPV VACCINES  Aged Out    Health Maintenance  Health Maintenance Due  Topic Date Due   COVID-19 Vaccine (4 - Booster for [place] series) 01/17/2021    Colorectal cancer screening: No longer required.   Mammogram status: Scheduled for 06/03/21  Bone Density status: Due-Declined  Lung Cancer Screening: (Low Dose CT Chest recommended if Age 38-80 years, 30 pack-year currently smoking OR have quit w/in 15years.) does not qualify.   Lung Cancer Screening : Completed 07/22/2020  Additional Screening:  Hepatitis C Screening: Completed 11/02/2015  Vision Screening: Recommended annual ophthalmology exams for early detection of glaucoma and other disorders of the eye. Is the patient up to date with their annual eye exam?  Yes  Who is the provider or what is the name of the office in which the patient attends annual eye exams? Dr. Truman Hayward   Dental Screening: Recommended annual dental exams for proper oral hygiene  Community Resource Referral / Chronic Care Management: CRR required this visit?  No   CCM required this visit?  No      Plan:     I have personally reviewed and noted the following in the patient's chart:   Medical and social history Use of alcohol, tobacco or illicit drugs  Current medications  and supplements including opioid prescriptions.  Functional ability and status Nutritional status Physical activity Advanced directives List of other physicians Hospitalizations, surgeries, and ER visits in previous 12 months Vitals Screenings to include cognitive, depression, and falls Referrals and appointments  In addition, I have reviewed and discussed with patient certain preventive protocols, quality metrics, and best practice recommendations. A written personalized care plan for  preventive services as well as general preventive health recommendations were provided to patient.   Due to this being a telephonic visit, the after visit summary with patients personalized plan was offered to patient via mail or my-chart. Patient would like to access on my-chart.   Marta Antu, LPN   05/29/4445  Nurse Health Advisor  Nurse Notes: None

## 2021-06-03 DIAGNOSIS — Z1231 Encounter for screening mammogram for malignant neoplasm of breast: Secondary | ICD-10-CM | POA: Diagnosis not present

## 2021-06-10 DIAGNOSIS — R922 Inconclusive mammogram: Secondary | ICD-10-CM | POA: Diagnosis not present

## 2021-06-10 DIAGNOSIS — R928 Other abnormal and inconclusive findings on diagnostic imaging of breast: Secondary | ICD-10-CM | POA: Diagnosis not present

## 2021-06-16 ENCOUNTER — Telehealth: Payer: Self-pay | Admitting: Family Medicine

## 2021-06-16 NOTE — Telephone Encounter (Signed)
Order stamped and faxed. FYI

## 2021-06-16 NOTE — Telephone Encounter (Signed)
Samantha Miranda is calling requesting for an order/referral for a Biopsy. Pt had Routine mama on 07/14.. It was abnormal. She came back on 07/21 for an Ultra sound and now now she is scheduled for a Biopsy on  07/27, but they need an order from her PCP.

## 2021-06-17 ENCOUNTER — Encounter: Payer: Self-pay | Admitting: Family Medicine

## 2021-06-17 DIAGNOSIS — C50811 Malignant neoplasm of overlapping sites of right female breast: Secondary | ICD-10-CM | POA: Diagnosis not present

## 2021-06-18 ENCOUNTER — Telehealth: Payer: Self-pay | Admitting: *Deleted

## 2021-06-18 ENCOUNTER — Encounter: Payer: Self-pay | Admitting: Family Medicine

## 2021-06-18 NOTE — Telephone Encounter (Signed)
Confirmed [facility] for 06/23/21 at 1215 .  Instructions and contact information given.

## 2021-06-22 ENCOUNTER — Encounter: Payer: Self-pay | Admitting: *Deleted

## 2021-06-22 ENCOUNTER — Other Ambulatory Visit: Payer: Self-pay | Admitting: *Deleted

## 2021-06-22 DIAGNOSIS — C50211 Malignant neoplasm of upper-inner quadrant of right female breast: Secondary | ICD-10-CM | POA: Insufficient documentation

## 2021-06-22 DIAGNOSIS — Z170 Estrogen receptor positive status [ER+]: Secondary | ICD-10-CM

## 2021-06-23 ENCOUNTER — Inpatient Hospital Stay: Payer: Medicare PPO | Attending: Hematology

## 2021-06-23 ENCOUNTER — Encounter: Payer: Medicare PPO | Admitting: Genetic Counselor

## 2021-06-23 ENCOUNTER — Encounter: Payer: Self-pay | Admitting: Hematology

## 2021-06-23 ENCOUNTER — Ambulatory Visit
Admission: RE | Admit: 2021-06-23 | Discharge: 2021-06-23 | Disposition: A | Discharge status: Home / Self Care | Payer: Medicare PPO | Source: Ambulatory Visit | Attending: Radiation Oncology | Admitting: Radiation Oncology

## 2021-06-23 ENCOUNTER — Ambulatory Visit: Payer: Medicare PPO | Attending: General Surgery | Admitting: Physical Therapy

## 2021-06-23 ENCOUNTER — Encounter: Payer: Self-pay | Admitting: *Deleted

## 2021-06-23 ENCOUNTER — Other Ambulatory Visit: Payer: Self-pay

## 2021-06-23 ENCOUNTER — Other Ambulatory Visit: Payer: Self-pay | Admitting: General Surgery

## 2021-06-23 ENCOUNTER — Encounter: Payer: Self-pay | Admitting: Physical Therapy

## 2021-06-23 ENCOUNTER — Inpatient Hospital Stay (HOSPITAL_BASED_OUTPATIENT_CLINIC_OR_DEPARTMENT_OTHER): Payer: Medicare PPO | Admitting: Hematology

## 2021-06-23 DIAGNOSIS — C50411 Malignant neoplasm of upper-outer quadrant of right female breast: Secondary | ICD-10-CM

## 2021-06-23 DIAGNOSIS — Z170 Estrogen receptor positive status [ER+]: Secondary | ICD-10-CM

## 2021-06-23 DIAGNOSIS — C50111 Malignant neoplasm of central portion of right female breast: Secondary | ICD-10-CM

## 2021-06-23 DIAGNOSIS — C50211 Malignant neoplasm of upper-inner quadrant of right female breast: Secondary | ICD-10-CM

## 2021-06-23 DIAGNOSIS — R293 Abnormal posture: Secondary | ICD-10-CM | POA: Insufficient documentation

## 2021-06-23 DIAGNOSIS — Z171 Estrogen receptor negative status [ER-]: Secondary | ICD-10-CM

## 2021-06-23 DIAGNOSIS — F17210 Nicotine dependence, cigarettes, uncomplicated: Secondary | ICD-10-CM | POA: Diagnosis not present

## 2021-06-23 DIAGNOSIS — Z79899 Other long term (current) drug therapy: Secondary | ICD-10-CM | POA: Insufficient documentation

## 2021-06-23 DIAGNOSIS — M8580 Other specified disorders of bone density and structure, unspecified site: Secondary | ICD-10-CM

## 2021-06-23 LAB — CMP (CANCER CENTER ONLY)
ALT: 17 U/L (ref 0–44)
AST: 24 U/L (ref 15–41)
Albumin: 3.8 g/dL (ref 3.5–5.0)
Alkaline Phosphatase: 49 U/L (ref 38–126)
Anion gap: 8 (ref 5–15)
BUN: 21 mg/dL (ref 8–23)
CO2: 27 mmol/L (ref 22–32)
Calcium: 9.6 mg/dL (ref 8.9–10.3)
Chloride: 106 mmol/L (ref 98–111)
Creatinine: 0.99 mg/dL (ref 0.44–1.00)
GFR, Estimated: 60 mL/min — ABNORMAL LOW (ref >60)
Glucose, Bld: 82 mg/dL (ref 70–99)
Potassium: 4.2 mmol/L (ref 3.5–5.1)
Sodium: 141 mmol/L (ref 135–145)
Total Bilirubin: 0.5 mg/dL (ref 0.3–1.2)
Total Protein: 7.1 g/dL (ref 6.5–8.1)

## 2021-06-23 LAB — CBC WITH DIFFERENTIAL (CANCER CENTER ONLY)
Abs Immature Granulocytes: 0.01 10*3/uL (ref 0.00–0.07)
Basophils Absolute: 0.1 10*3/uL (ref 0.0–0.1)
Basophils Relative: 1 %
Eosinophils Absolute: 0.4 10*3/uL (ref 0.0–0.5)
Eosinophils Relative: 7 %
HCT: 40.6 % (ref 36.0–46.0)
Hemoglobin: 13.5 g/dL (ref 12.0–15.0)
Immature Granulocytes: 0 %
Lymphocytes Relative: 27 %
Lymphs Abs: 1.5 10*3/uL (ref 0.7–4.0)
MCH: 30.9 pg (ref 26.0–34.0)
MCHC: 33.3 g/dL (ref 30.0–36.0)
MCV: 92.9 fL (ref 80.0–100.0)
Monocytes Absolute: 0.4 10*3/uL (ref 0.1–1.0)
Monocytes Relative: 8 %
Neutro Abs: 3.1 10*3/uL (ref 1.7–7.7)
Neutrophils Relative %: 57 %
Platelet Count: 174 10*3/uL (ref 150–400)
RBC: 4.37 MIL/uL (ref 3.87–5.11)
RDW: 12.8 % (ref 11.5–15.5)
WBC Count: 5.6 10*3/uL (ref 4.0–10.5)
nRBC: 0 % (ref 0.0–0.2)

## 2021-06-23 LAB — GENETIC SCREENING ORDER

## 2021-06-23 NOTE — Patient Instructions (Signed)

## 2021-06-23 NOTE — Therapy (Signed)
[place], [place], [address] Phone: [phone]   Fax:  [phone]  Physical Therapy Evaluation  Patient Details  Name: Samantha Miranda MRN: [id number] Date of Birth: [birth year] Referring Provider (PT): Dr. Rolm Bookbinder   Encounter Date: 06/23/2021   PT End of Session - 06/23/21 1719     Visit Number 1    Number of Visits 2    Date for PT Re-Evaluation 08/18/21    PT Start Time 1440    PT Stop Time 1505    PT Time Calculation (min) 25 min    Activity Tolerance Patient tolerated treatment well    Behavior During Therapy [facility] for tasks assessed/performed             Past Medical History:  Diagnosis Date   Barrett's esophagus    Basal cell carcinoma of left lower leg    Breast cancer ([place])    right   COPD (chronic obstructive pulmonary disease) ([place]) 2018   Depression 1989   Gastric ulcer    GERD (gastroesophageal reflux disease)    History of colon polyps    Hyperlipidemia    Osteopenia     Past Surgical History:  Procedure Laterality Date   ABDOMINAL HYSTERECTOMY  11/21/1989   APPENDECTOMY  1969   BREAST LUMPECTOMY  07/22/2006   CATARACT EXTRACTION, BILATERAL     EYE SURGERY  08/2013  10/2013   cataracts    There were no vitals filed for this visit.    Subjective Assessment - 06/23/21 1651     Subjective Patient reports she is here today to be seen by her medical team for her newly diagnosed right breast cancer.    Pertinent History Patient was diagnosed on 06/03/2021 with right grade I invasive ductal carcinoma breast cancer. It measures 1 cm and is located in the upper outer quadrant of her right breast. It is ER/PR positive and HER2 negative with a Ki67 of 10%. 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    Patient Stated Goals Reduce lymphedema risk and learn post op shoulder ROM HEP    Currently in Pain? No/denies                 [facility] PT Assessment - 06/23/21 0001       Assessment   Medical Diagnosis RIght breast cancer    Referring Provider (PT) Dr. Rolm Bookbinder    Onset Date/Surgical Date 06/03/21    Hand Dominance Right    Prior Therapy none      Precautions   Precautions Other (comment)    Precaution Comments active cancer      Restrictions   Weight Bearing Restrictions No      Balance Screen   Has the patient fallen in the past 6 months No    Has the patient had a decrease in activity level because of a fear of falling?  No    Is the patient reluctant to leave their home because of a fear of falling?  No      Home Environment   Living Environment Private residence    Living Arrangements Spouse/significant other    Available Help at Discharge Family      Prior Function   Level of [place] Retired    Leisure She gardens, uses a Equities trader, and does Community education officer   Overall Cognitive Status Within  Functional Limits for tasks assessed      Posture/Postural Control   Posture/Postural Control Postural limitations    Postural Limitations Rounded Shoulders;Forward head;Increased thoracic kyphosis      ROM / Strength   AROM / PROM / Strength AROM;Strength      AROM   Overall AROM Comments Cervical AROM is WNL    AROM Assessment Site Shoulder    Right/Left Shoulder Right;Left    Right Shoulder Extension 43 Degrees    Right Shoulder Flexion 145 Degrees    Right Shoulder ABduction 163 Degrees    Right Shoulder Internal Rotation 54 Degrees    Right Shoulder External Rotation 90 Degrees    Left Shoulder Extension 46 Degrees    Left Shoulder Flexion 155 Degrees    Left Shoulder ABduction 161 Degrees    Left Shoulder Internal Rotation 54 Degrees    Left Shoulder External Rotation 78 Degrees      Strength   Overall Strength Within functional limits for tasks performed               LYMPHEDEMA/ONCOLOGY QUESTIONNAIRE - 06/23/21 0001        Type   Cancer Type Right breast cancer      Lymphedema Assessments   Lymphedema Assessments Upper extremities      Right Upper Extremity Lymphedema   10 cm Proximal to Olecranon Process 25.4 cm    Olecranon Process 21.5 cm    10 cm Proximal to Ulnar Styloid Process 19.3 cm    Just Proximal to Ulnar Styloid Process 13.2 cm    Across Hand at [employer] 17.2 cm    At [place] of 2nd Digit 5.7 cm      Left Upper Extremity Lymphedema   10 cm Proximal to Olecranon Process 25.5 cm    Olecranon Process 21.1 cm    10 cm Proximal to Ulnar Styloid Process 28.4 cm    Just Proximal to Ulnar Styloid Process 13 cm    Across Hand at [employer] 17.4 cm    At [place] of 2nd Digit 5.4 cm             L-DEX FLOWSHEETS - 06/23/21 1700       L-DEX LYMPHEDEMA SCREENING   Measurement Type Unilateral    L-DEX MEASUREMENT EXTREMITY Upper Extremity    POSITION  Standing    DOMINANT SIDE Right    At Risk Side Right    BASELINE SCORE (UNILATERAL) -8.3             The patient was assessed using the L-Dex machine today to produce a lymphedema index baseline score. The patient will be reassessed on a regular basis (typically every 3 months) to obtain new L-Dex scores. If the score is > 6.5 points away from his/her baseline score indicating onset of subclinical lymphedema, it will be recommended to wear a compression garment for 4 weeks, 12 hours per day and then be reassessed. If the score continues to be > 6.5 points from baseline at reassessment, we will initiate lymphedema treatment. Assessing in this manner has a 95% rate of preventing clinically significant lymphedema.      Katina Dung - 06/23/21 0001     Open a tight or new jar No difficulty    Do heavy household chores (wash walls, wash floors) No difficulty    Carry a shopping bag or briefcase No difficulty    Wash your back Moderate difficulty    Use a knife to cut  food No difficulty    Recreational activities in which you take  some force or impact through your arm, shoulder, or hand (golf, hammering, tennis) No difficulty    During the past week, to what extent has your arm, shoulder or hand problem interfered with your normal social activities with family, friends, neighbors, or groups? Not at all    During the past week, to what extent has your arm, shoulder or hand problem limited your work or other regular daily activities Slightly    Arm, shoulder, or hand pain. Mild    Tingling (pins and needles) in your arm, shoulder, or hand Moderate    Difficulty Sleeping Moderate difficulty    DASH Score 18.18 %              Objective measurements completed on examination: See above findings.       Patient was instructed today in a home exercise program today for post op shoulder range of motion. These included active assist shoulder flexion in sitting, scapular retraction, wall walking with shoulder abduction, and hands behind head external rotation.  She was encouraged to do these twice a day, holding 3 seconds and repeating 5 times when permitted by her physician.          PT Education - 06/23/21 1718     Education Details Lymphedema risk reduction and post op shoulder ROM HEP    Person(s) Educated Patient    Methods Explanation;Demonstration;Handout    Comprehension Returned demonstration;Verbalized understanding                 PT Long Term Goals - 06/23/21 1722       PT LONG TERM GOAL #1   Title Patient will demonstrate she has regained full shoulder ROM and function post operatively compared to baselines.    Time 8    Period Weeks    Status New    Target Date 08/18/21             Breast Clinic Goals - 06/23/21 1721       Patient will be able to verbalize understanding of pertinent lymphedema risk reduction practices relevant to her diagnosis specifically related to skin care.   Time 1    Period Days    Status Achieved      Patient will be able to return demonstrate  and/or verbalize understanding of the post-op home exercise program related to regaining shoulder range of motion.   Time 1    Period Days    Status Achieved      Patient will be able to verbalize understanding of the importance of attending the postoperative After Breast Cancer Class for further lymphedema risk reduction education and therapeutic exercise.   Time 1    Period Days    Status Achieved                   Plan - 06/23/21 1709     Clinical Impression Statement Patient was diagnosed on 06/03/2021 with right grade I invasive ductal carcinoma breast cancer. It measures 1 cm and is located in the upper outer quadrant of her right breast. It is ER/PR positive and HER2 negative with a Ki67 of 10%. Her multidisciplinary medical team met prior to her assessments to determine a recommended treatment plan. She is planning to have a right lumpectomy and sentinel node biopsy followed by radiation and anti-estrogen therapy. She will benefit from a post op PT reassessment and from L-Dex screens every 3 months  for 2 years to detect subclinical lymphedema.    Stability/Clinical Decision Making Stable/Uncomplicated    Clinical Decision Making Low    Rehab Potential Excellent    PT Frequency --   Eval and 1 f/u visit   PT Treatment/Interventions ADLs/Self Care Home Management;Therapeutic exercise;Patient/family education    PT Next Visit Plan Will reassess 3-4 weeks post op    PT Home Exercise Plan Post op shoulder ROM HEP    Consulted and Agree with Plan of Care Patient             Patient will benefit from skilled therapeutic intervention in order to improve the following deficits and impairments:  Postural dysfunction, Decreased range of motion, Decreased knowledge of precautions, Impaired UE functional use, Pain  Visit Diagnosis: Malignant neoplasm of upper-outer quadrant of right breast in female, estrogen receptor positive ([place]) - Plan: PT plan of care cert/re-cert  Abnormal  posture - Plan: PT plan of care cert/re-cert  Patient will follow up at outpatient cancer rehab 3-4 weeks following surgery.  If the patient requires physical therapy at that time, a specific plan will be dictated and sent to the referring physician for approval. The patient was educated today on appropriate basic range of motion exercises to begin post operatively and the importance of attending the After Breast Cancer class following surgery.  Patient was educated today on lymphedema risk reduction practices as it pertains to recommendations that will benefit the patient immediately following surgery.  She verbalized good understanding.      Problem List Patient Active Problem List   Diagnosis Date Noted   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 ([place]) 06/23/2021   Depression, major, single episode, in partial remission ([place]) 06/11/2019   Influenza vaccine administered 11/02/2015   Otitis media 08/10/2015   Acute bronchitis 04/21/2015   Right ankle pain 02/10/2014   Left shoulder pain 11/22/2013   Right shoulder pain 08/20/2013   Near syncope 08/12/2013   Other malaise and fatigue 08/12/2013   Rectal itching 07/01/2013   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   GERD (gastroesophageal reflux disease) 08/12/2012   Former heavy tobacco smoker 08/26/2010   COLONIC POLYPS, HX OF 08/26/2010   POSTMENOPAUSAL STATUS 08/26/2010   POSTMENOPAUSAL BLEEDING 04/13/2009   Hyperlipidemia 07/11/2007   DEPRESSION 07/11/2007   OSTEOPENIA 07/11/2007   BREAST CANCER, HX OF 07/11/2007   Annia Friendly, PT 06/23/21 5:26 PM   [facility] [address] [place], [place], [address] Phone: [phone]   Fax:  [phone]  Name: CICELY ORTNER MRN: [id number] Date of Birth: 1946/06/22

## 2021-06-23 NOTE — Progress Notes (Signed)
[place]   [phone]) [phone] Fax:([phone]) [place] Note   Patient Care Team: Carollee Herter, Alferd Apa, DO as PCP - General Shirley Muscat Loreen Freud, MD as Referring Physician (Optometry) Izora Ribas (Dermatology) Mauro Kaufmann, RN as Oncology Nurse Navigator Rockwell Germany, RN as Oncology Nurse Navigator Rolm Bookbinder, MD as Consulting Physician (General Surgery) Truitt Merle, MD as Consulting Physician (Hematology) Kyung Rudd, MD as Consulting Physician (Radiation Oncology)  Date of Service:  06/23/2021   CHIEF COMPLAINTS/PURPOSE OF CONSULTATION:  Right Breast Cancer, ER+  REFERRING PHYSICIAN:  [facility]   Oncology History Overview Note  Cancer Staging 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   06/17/2021 Pathology Results   Diagnosis Breast, right, needle core biopsy, 3:00, 4 cmfn  - INVASIVE MAMMARY CARCINOMA, SEE COMMENT  - MAMMARY CARCINOMA IN SITU. The carcinoma appears grade 1. E-cadherin is positive in the invasive and in situ carcinoma consistent with a ductal phenotype.  PROGNOSTIC INDICATORS Results: IMMUNOHISTOCHEMICAL AND MORPHOMETRIC ANALYSIS PERFORMED MANUALLY The tumor cells are EQUIVOCAL for HER2 (2+). Her2 by FISH will be performed and results reported separately. 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 female is a here because of breast cancer. The patient was [facility] by [facility]. The patient presents to the clinic today alone. She notes her husband came with her, but he left to go eat as he has diabetes.   She had routine screening mammography on 06/03/21 showing a possible abnormality in the right breast. She underwent right diagnostic mammography and right breast ultrasonography on 06/10/21 showing: 1 cm irregular mass in right breast at 3 o'clock.  Biopsy on 06/17/21 showed: invasive ductal carcinoma, grade 1; DCIS. Prognostic indicators significant for: estrogen receptor, 90% positive and progesterone receptor, 5% positive. Proliferation marker Ki67 at 10%. HER2 negative by FISH.    Today the patient notes they felt/feeling prior/after... -She reports numbness to her left arm during the night, which she has attributed to how she sleeps. This has been going on for the last month.   She has a PMHx of.... -Right breast DCIS, s/p lumpectomy in 08/2006. She only received ~6 fractions of radiation therapy because she was afraid it would cause lung cancer. She was a smoker at the time of radiation. She did not take antiestrogens. -Mohs procedures for basal cell carcinoma on her face -stomach ulcers (she denies Barrett's esophagus, believes it was a misdiagnosis) -Hysterectomy in 1991 for menorrhagia    Socially... -She is married. She  retired in 2012. -Her sister was diagnosed with breast cancer around the same time, and it ultimately took her life in 2011 at age 48 because of how advanced it was at the time of diagnosis. -Her father died from lung  cancer at age 67. -One daughter, age 68, who does not have a job or insurance due to a drug problem. -history of depression   GYN HISTORY  Menarchal: 75 years old LMP: 2, with hysterectomy Contraceptive: HRT:  GP: 1 She notes her menopause was difficult, with intense hot flashes.   REVIEW OF SYSTEMS:    Constitutional: Denies fevers, chills or abnormal night sweats Eyes: Denies blurriness of vision, double vision or watery eyes Ears, nose, mouth, throat, and face: Denies mucositis or sore throat Respiratory: Denies cough, dyspnea or wheezes Cardiovascular: Denies palpitation, chest discomfort or lower extremity swelling Gastrointestinal:  Denies nausea, heartburn or change in bowel habits Skin: Denies abnormal skin rashes Lymphatics: Denies new lymphadenopathy or easy bruising Neurological:Denies numbness, tingling or new weaknesses Behavioral/Psych: Mood is stable, no new changes  All other systems were reviewed with the patient and are negative.   MEDICAL HISTORY:  Past Medical History:  Diagnosis Date   Barrett's esophagus    Basal cell carcinoma of left lower leg    Breast cancer (HCC)    right   COPD (chronic obstructive pulmonary disease) ([place]) 2018   Depression 1989   Gastric ulcer    GERD (gastroesophageal reflux disease)    History of colon polyps    Hyperlipidemia    Osteopenia     SURGICAL HISTORY: Past Surgical History:  Procedure Laterality Date   ABDOMINAL HYSTERECTOMY  11/21/1989   APPENDECTOMY  1969   BREAST LUMPECTOMY  07/22/2006   CATARACT EXTRACTION, BILATERAL     EYE SURGERY  08/2013  10/2013   cataracts    SOCIAL HISTORY: Social History   Socioeconomic History   Marital status: Married    Spouse name: Not on file   Number of children: 1   Years of education: Not on file   Highest education level: Not on file  Occupational History   Occupation: retired    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 I smoked .5 pack a day. Before that, 1 pk. a day  Vaping Use   Vaping Use: Former   Substances: Nicotine  Substance and Sexual Activity   Alcohol use: No   Drug use: No   Sexual activity: Not Currently    Partners: Male    Birth control/protection: Post-menopausal  Other Topics Concern   Not on file  Social History Narrative   Exercise-no   Social Determinants of Health   Financial Resource Strain: Low Risk    Difficulty of Paying Living Expenses: Not hard at all  Food Insecurity: No Food Insecurity   Worried About Charity fundraiser in the Last Year: Never true   Arboriculturist in the Last Year: Never true  Transportation Needs: No Transportation Needs   Lack of Transportation (Medical): No   Lack of Transportation (Non-Medical): No  Physical Activity: Sufficiently Active   Days of Exercise  per Week: 6 days   Minutes of Exercise per Session: 30 min  Stress: No Stress Concern Present   Feeling of Stress : Not at all  Social Connections: Moderately Isolated   Frequency of Communication with Friends and Family: More than three times a week   Frequency of Social Gatherings with Friends and Family: More than three times a week   Attends Religious Services: Never   Marine scientist or Organizations: No   Attends Music therapist: Never   Marital Status: Married  Human resources officer Violence: Not At Risk   Fear of Current or Ex-Partner: No   Emotionally Abused: No   Physically Abused: No   Sexually Abused: No    FAMILY HISTORY: Family History  Problem Relation Age of Onset   Heart failure Mother        CHF   Hyperlipidemia Mother    Aortic stenosis Mother    Arthritis Mother    Heart disease Mother    Lung cancer  Father 65   Cancer Father    Early death Father    Breast cancer Sister 58   Cancer Sister    Depression Sister    Stroke Maternal Aunt    Varicose Veins Maternal Aunt    Diabetes Maternal Grandmother    Hyperlipidemia Maternal Grandmother    Colon cancer Maternal Grandfather    Cancer Maternal Grandfather        colon cancer   Cancer Paternal Grandfather        unknown type cancer   Drug abuse Daughter    Heart attack Neg Hx    Hypertension Neg Hx    Sudden death Neg Hx    Esophageal cancer Neg Hx    Stomach cancer Neg Hx    Rectal cancer Neg Hx     ALLERGIES:  is allergic to amoxicillin-pot clavulanate and moxifloxacin.  MEDICATIONS:  Current Outpatient Medications  Medication Sig Dispense Refill   Cholecalciferol (VITAMIN D) 2000 UNITS tablet Take 4,000 Units by mouth daily.     Coenzyme Q10 (COQ10 PO) Take 1 each by mouth daily.     Collagenase POWD by Does not apply route.     ezetimibe (ZETIA) 10 MG tablet TAKE 1 TABLET(10 MG) BY MOUTH DAILY 90 tablet 1   FLUoxetine (PROZAC) 10 MG tablet Take 1 tablet (10 mg total) by mouth daily. 90 tablet 1   fluticasone (FLONASE) 50 MCG/ACT nasal spray Place 2 sprays into both nostrils daily. 16 g 5   hydrocortisone (ANUSOL-HC) 2.5 % rectal cream Place 1 application rectally 2 (two) times daily as needed for hemorrhoids or anal itching. 30 g 1   lidocaine (XYLOCAINE) 2 % solution Use as directed 5-10 mLs in the mouth or throat every 4 (four) hours as needed for mouth pain. Do not swallow. 100 mL 0   MAGNESIUM PO Take 1 each by mouth daily.     Misc Natural Products (CHOLESTEROL SUPPORT PO) Take by mouth.     Multiple Vitamins-Minerals (MULTIVITAMIN ADULTS PO) Take 1 tablet by mouth daily.     OVER THE COUNTER MEDICATION Take 1 capsule by mouth daily. Cholesterol solution vitamin     OVER THE COUNTER MEDICATION Take 2 capsules by mouth daily. Lung and bronchial support vitamin     OVER THE COUNTER MEDICATION Take 2 capsules by mouth  daily. Omega Q Plus vitamin     pantoprazole (PROTONIX) 40 MG tablet Take 1 tablet (40 mg total) by mouth daily before  breakfast. 90 tablet 3   Pitavastatin Magnesium (ZYPITAMAG) 2 MG TABS Take 1 mg by mouth daily. (Patient not taking: Reported on 05/25/2021) 30 tablet 2   XIIDRA 5 % SOLN instill 1 drop into both eyes every 12 hours  1   zolpidem (AMBIEN) 5 MG tablet Take 1 tablet (5 mg total) by mouth at bedtime as needed for sleep. 15 tablet 1   No current facility-administered medications for this visit.    PHYSICAL EXAMINATION: ECOG PERFORMANCE STATUS: 0 - Asymptomatic  Vitals:   06/23/21 1249  BP: (!) 156/57  Pulse: 74  Resp: 18  Temp: 98 F (36.7 C)  SpO2: 98%   Filed Weights   06/23/21 1249  Weight: 121 lb 11.2 oz (55.2 kg)    GENERAL:alert, no distress and comfortable SKIN: skin color, texture, turgor are normal, no rashes or significant lesions EYES: normal, Conjunctiva are pink and non-injected, sclera clear  NECK: supple, thyroid normal size, non-tender, without nodularity LYMPH:  no palpable lymphadenopathy in the cervical, axillary  LUNGS: clear to auscultation and percussion with normal breathing effort HEART: regular rate & rhythm and no murmurs and no lower extremity edema ABDOMEN:abdomen soft, non-tender and normal bowel sounds Musculoskeletal:no cyanosis of digits and no clubbing  NEURO: alert & oriented x 3 with fluent speech, no focal motor/sensory deficits BREAST: Nipples inverted bilaterally. No palpable mass, nodules or adenopathy bilaterally. Breast exam benign.  LABORATORY DATA:  I have reviewed the data as listed CBC Latest Ref Rng & Units 06/23/2021 08/04/2017 06/05/2017  WBC 4.0 - 10.5 K/uL 5.6 5.7 5.8  Hemoglobin 12.0 - 15.0 g/dL 13.5 13.0 14.0  Hematocrit 36.0 - 46.0 % 40.6 38.5 41.1  Platelets 150 - 400 K/uL 174 210 200.0    CMP Latest Ref Rng & Units 06/23/2021 05/17/2021 01/18/2021  Glucose 70 - 99 mg/dL 82 86 78  BUN 8 - 23 mg/dL _0 Creatinine 0.44 - 1.00 mg/dL 0.99 0.94 0.89  Sodium 135 - 145 mmol/L 141 139 140  Potassium 3.5 - 5.1 mmol/L 4.2 4.4 4.0  Chloride 98 - 111 mmol/L 106 105 103  CO2 22 - 32 mmol/L _1 Calcium 8.9 - 10.3 mg/dL 9.6 9.2 9.4  Total Protein 6.5 - 8.1 g/dL 7.1 6.8 6.9  Total Bilirubin 0.3 - 1.2 mg/dL 0.5 0.5 0.5  Alkaline Phos 38 - 126 U/L 49 50 47  AST 15 - 41 U/L _2 ALT 0 - 44 U/L _3 RADIOGRAPHIC STUDIES: I have personally reviewed the radiological images as listed and agreed with the findings in the report. No results found.  ASSESSMENT & PLAN:  Samantha Miranda is a 75 y.o. postmenopausal female with   1. 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 [place] on 06/10/21 showed a 1 cm right breast mass at 3 o'clock. Biopsy on 06/17/21 revealed grade 1 IDC with DCIS, ER+, PR+, Her2- -We discussed her imaging findings and the biopsy results in great details. -Giving the early stage disease, she likely need a lumpectomy. She is considering mastectomy due to her previous DCIS and desire to avoid radiation. She was seen by Dr. Donne Hazel today and likely will proceed with surgery soon.  -I recommend a Oncotype Dx test on the surgical sample and we'll make a decision about adjuvant chemotherapy based on the Oncotype result. Written material  of this test was given to her. She is overall in good health, would be a good candidate for chemotherapy if her Oncotype recurrence score is high. She is agreeable.  -Given the strong ER and PR positive HER2 negative disease, and grade 1, I anticipate this is low risk disease. -Giving the strong ER and PR expression in her postmenopausal status, I recommend adjuvant endocrine therapy with aromatase inhibitor for a total of 5-10 years to reduce the risk of cancer recurrence. Potential benefits and side effects were discussed with patient. She is  reluctant to take antiestrogen therapy. We will discuss again after surgery or radiation. -She was also seen by radiation oncologist Dr. Lisbeth Renshaw today. Adjuvant radiation after lumpectomy is recommended. If her surgical sentinel lymph nodes were negative, she would not need post mastectomy radiation.    2. Bone Health  -She has had DEXA before, but she does not remember the date and we do not have it on file.  3. Genetic Testing -due to her strong family history of cancer, she is eligible for genetic testing.  -I recommended this given she has a daughter. She declines, as she does not want to know.   PLAN:  -she will proceed with surgery soon -Oncotype Dx on surgical sample  -f/u after surgery or radiation    No orders of the defined types were placed in this encounter.   All questions were answered. The patient knows to call the clinic with any problems, questions or concerns. The total time spent in the appointment was 55 minutes.     Truitt Merle, MD 06/23/2021   I, Wilburn Mylar, am acting as scribe for Truitt Merle, MD.   I have reviewed the above documentation for accuracy and completeness, and I agree with the above.

## 2021-06-24 ENCOUNTER — Encounter: Payer: Self-pay | Admitting: *Deleted

## 2021-06-24 ENCOUNTER — Telehealth: Payer: Self-pay | Admitting: *Deleted

## 2021-06-24 NOTE — Telephone Encounter (Signed)
Left vm regarding [place] from 06/23/21. Contact information provided for questions or needs.

## 2021-06-24 NOTE — Progress Notes (Signed)
Radiation Oncology         [phone] ________________________________  Name: Samantha Miranda MRN: [id number]  Date: 06/23/2021  DOB: 01/16/1946  EG:BTDVV Chase, Samantha Apa, DO  Rolm Bookbinder, MD     REFERRING PHYSICIAN: Rolm Bookbinder, MD   DIAGNOSIS: The encounter diagnosis was Malignant neoplasm of central portion of right breast in female, estrogen receptor negative ([place]).   HISTORY OF PRESENT ILLNESS::Samantha Miranda is a 75 y.o. female who is seen for an initial consultation visit regarding the patient's diagnosis of right-sided breast cancer.  The patient does have a history of prior right-sided DCIS for which she underwent a lumpectomy and adjuvant radiation treatment.  The patient states that she had significant skin irritation at that time.  Recent work-up revealed a suspicious abnormality at the lumpectomy site and ultrasound revealed a 1.0 cm tumor at this location.  No axillary adenopathy was seen.  Biopsy confirmed a grade 1 invasive ductal carcinoma.  Estrogen receptors were positive, progesterone receptors positive, and HER2/neu negative.  The Ki-67 staining was 10%.  Given this recent finding have been asked to see the patient for consideration of adjuvant radiation treatment.    PREVIOUS RADIATION THERAPY: Yes , as above adjuvant radiation treatment approximately 15 years ago after lumpectomy for right-sided DCIS   PAST MEDICAL HISTORY:  has a past medical history of Barrett's esophagus, Basal cell carcinoma of left lower leg, Breast cancer ([place]), COPD (chronic obstructive pulmonary disease) ([place]) (2018), Depression (1989), Gastric ulcer, GERD (gastroesophageal reflux disease), History of colon polyps, Hyperlipidemia, and Osteopenia.     PAST SURGICAL HISTORY: Past Surgical History:  Procedure Laterality Date   ABDOMINAL HYSTERECTOMY  11/21/1989   APPENDECTOMY  1969   BREAST LUMPECTOMY  07/22/2006   CATARACT EXTRACTION, BILATERAL     EYE SURGERY  08/2013   10/2013   cataracts     FAMILY HISTORY: family history includes Aortic stenosis in her mother; Arthritis in her mother; Breast cancer (age of onset: 65) in her sister; Cancer in her father, maternal grandfather, paternal grandfather, and sister; Colon cancer in her maternal grandfather; Depression in her sister; Diabetes in her maternal grandmother; Drug abuse in her daughter; Early death in her father; Heart disease in her mother; Heart failure in her mother; Hyperlipidemia in her maternal grandmother and mother; Lung cancer (age of onset: 18) in her father; Stroke in her maternal aunt; Varicose Veins in her maternal aunt.   SOCIAL HISTORY:  reports that she quit smoking about 9 years ago. Her smoking use included cigarettes and e-cigarettes. She has a 20.00 pack-year smoking history. She has never used smokeless tobacco. She reports that she does not drink alcohol and does not use drugs.   ALLERGIES: Amoxicillin-pot clavulanate and Moxifloxacin   MEDICATIONS:  Current Outpatient Medications  Medication Sig Dispense Refill   Cholecalciferol (VITAMIN D) 2000 UNITS tablet Take 4,000 Units by mouth daily.     Coenzyme Q10 (COQ10 PO) Take 1 each by mouth daily.     Collagenase POWD by Does not apply route.     ezetimibe (ZETIA) 10 MG tablet TAKE 1 TABLET(10 MG) BY MOUTH DAILY 90 tablet 1   FLUoxetine (PROZAC) 10 MG tablet Take 1 tablet (10 mg total) by mouth daily. 90 tablet 1   fluticasone (FLONASE) 50 MCG/ACT nasal spray Place 2 sprays into both nostrils daily. 16 g 5   hydrocortisone (ANUSOL-HC) 2.5 % rectal cream Place 1 application rectally 2 (two) times daily as needed for hemorrhoids or anal  itching. 30 g 1   lidocaine (XYLOCAINE) 2 % solution Use as directed 5-10 mLs in the mouth or throat every 4 (four) hours as needed for mouth pain. Do not swallow. 100 mL 0   MAGNESIUM PO Take 1 each by mouth daily.     Misc Natural Products (CHOLESTEROL SUPPORT PO) Take by mouth.     Multiple  Vitamins-Minerals (MULTIVITAMIN ADULTS PO) Take 1 tablet by mouth daily.     OVER THE COUNTER MEDICATION Take 1 capsule by mouth daily. Cholesterol solution vitamin     OVER THE COUNTER MEDICATION Take 2 capsules by mouth daily. Lung and bronchial support vitamin     OVER THE COUNTER MEDICATION Take 2 capsules by mouth daily. Omega Q Plus vitamin     pantoprazole (PROTONIX) 40 MG tablet Take 1 tablet (40 mg total) by mouth daily before breakfast. 90 tablet 3   Pitavastatin Magnesium (ZYPITAMAG) 2 MG TABS Take 1 mg by mouth daily. (Patient not taking: Reported on 05/25/2021) 30 tablet 2   XIIDRA 5 % SOLN instill 1 drop into both eyes every 12 hours  1   zolpidem (AMBIEN) 5 MG tablet Take 1 tablet (5 mg total) by mouth at bedtime as needed for sleep. 15 tablet 1   No current Miranda-administered medications for this encounter.     REVIEW OF SYSTEMS:  A 15 point review of systems is documented in the electronic medical record. This was obtained by the nursing staff. However, I reviewed this with the patient to discuss relevant findings and make appropriate changes.  Pertinent items are noted in HPI.    PHYSICAL EXAM:  vitals were not taken for this visit.   ECOG = 0  0 - Asymptomatic (Fully active, able to carry on all predisease activities without restriction)  1 - Symptomatic but completely ambulatory (Restricted in physically strenuous activity but ambulatory and able to carry out work of a light or sedentary nature. For example, light housework, office work)  2 - Symptomatic, <50% in bed during the day (Ambulatory and capable of all self care but unable to carry out any work activities. Up and about more than 50% of waking hours)  3 - Symptomatic, >50% in bed, but not bedbound (Capable of only limited self-care, confined to bed or chair 50% or more of waking hours)  4 - Bedbound (Completely disabled. Cannot carry on any self-care. Totally confined to bed or chair)  5 - Death   Samantha Miranda MM,  Samantha Miranda, Samantha Miranda, et al. [phone]). "Toxicity and response criteria of the [facility]". [place] Oncol. 5 (6): 649-55  Alert, no distress   LABORATORY DATA:  Lab Results  Component Value Date   WBC 5.6 06/23/2021   HGB 13.5 06/23/2021   HCT 40.6 06/23/2021   MCV 92.9 06/23/2021   PLT 174 06/23/2021   Lab Results  Component Value Date   NA 141 06/23/2021   K 4.2 06/23/2021   CL 106 06/23/2021   CO2 27 06/23/2021   Lab Results  Component Value Date   ALT 17 06/23/2021   AST 24 06/23/2021   ALKPHOS 49 06/23/2021   BILITOT 0.5 06/23/2021      RADIOGRAPHY: No results found.     IMPRESSION/ PLAN:  The patient was seen today in multidisciplinary breast clinic for her recent diagnosis of an invasive ductal carcinoma of the right breast.  She does have a history of DCIS on the side for which she underwent lumpectomy  and adjuvant radiation treatment approximately 15 years ago.  Due to this prior treatment, I indicated to the patient that we would like to avoid reirradiation.  Since she has an early tumor, this certainly is feasible.  She is going to discuss options with surgery further, mastectomy versus lumpectomy.  She understands that we would not anticipate a need for radiation treatment or desire to give radiation treatment if at all possible postoperatively although this certainly could be reevaluated if there are worrisome findings which are not anticipated.  The patient is amenable to this and also would like to avoid radiation treatment.  I look forward to reviewing her additional information as she proceeds with treatment but we do not anticipate any radiation treatment at this time.  Staging-[id number] IDC of the right breast   The patient was seen in person today in clinic.  The total time spent on the patient's visit today was 30 minutes, including chart review, direct discussion/evaluation with the patient, and coordination of care.         ________________________________   Jodelle Gross, MD, PhD   **Disclaimer: This note was dictated with voice recognition software. Similar sounding words can inadvertently be transcribed and this note may contain transcription errors which may not have been corrected upon publication of note.**

## 2021-06-28 NOTE — Pre-Procedure Instructions (Signed)
Surgical Instructions    Your procedure is scheduled on Friday, August 12th.  Report to [facility] Main Entrance "A" at 7:00 A.M., then check in with the Admitting office.  Call this number if you have problems the morning of surgery:  [phone]   If you have any questions prior to your surgery date call [phone]: Open Monday-Friday 8am-4pm    Remember:  Do not eat after midnight the night before your surgery  You may drink clear liquids until 6:00 a.m. the morning of your surgery.   Clear liquids allowed are: Water, Non-Citrus Juices (without pulp), Carbonated Beverages, Clear Tea, Black Coffee Only, and Gatorade.  Patient Instructions  The day of surgery (if you do NOT have diabetes):  Drink ONE (1) Pre-Surgery Clear Ensure by 6:00 a.m. the morning of surgery   This drink was given to you during your hospital  pre-op appointment visit. Nothing else to drink after completing the  Pre-Surgery Clear Ensure.         If you have questions, please contact your surgeon's office.     Take these medicines the morning of surgery with A SIP OF WATER  ezetimibe (ZETIA)  FLUoxetine (PROZAC)  pantoprazole (PROTONIX)  XIIDRA (eye drops) fluticasone (FLONASE)-as needed for allergies  As of today, STOP taking any Aspirin (unless otherwise instructed by your surgeon) Aleve, Naproxen, Ibuprofen, Motrin, Advil, Goody's, BC's, all herbal medications, fish oil, and all vitamins.                     Do NOT Smoke (Tobacco/Vaping) or drink Alcohol 24 hours prior to your procedure.  If you use a CPAP at night, you may bring all equipment for your overnight stay.   Contacts, glasses, piercing's, hearing aid's, dentures or partials may not be worn into surgery, please bring cases for these belongings.    For patients admitted to the hospital, discharge time will be determined by your treatment team.   Patients discharged the day of surgery will not be allowed to drive home, and someone  needs to stay with them for 24 hours.  ONLY 1 SUPPORT PERSON MAY BE PRESENT WHILE YOU ARE IN SURGERY. IF YOU ARE TO BE ADMITTED ONCE YOU ARE IN YOUR ROOM YOU WILL BE ALLOWED TWO (2) VISITORS.  Minor children may have two parents present. Special consideration for safety and communication needs will be reviewed on a case by case basis.   Special instructions:   [facility]- Preparing For Surgery  Before surgery, you can play an important role. Because skin is not sterile, your skin needs to be as free of germs as possible. You can reduce the number of germs on your skin by washing with CHG (chlorahexidine gluconate) Soap before surgery.  CHG is an antiseptic cleaner which kills germs and bonds with the skin to continue killing germs even after washing.    Oral Hygiene is also important to reduce your risk of infection.  Remember - BRUSH YOUR TEETH THE MORNING OF SURGERY WITH YOUR REGULAR TOOTHPASTE  Please do not use if you have an allergy to CHG or antibacterial soaps. If your skin becomes reddened/irritated stop using the CHG.  Do not shave (including legs and underarms) for at least 48 hours prior to first CHG shower. It is OK to shave your face.  Please follow these instructions carefully.   Shower the NIGHT BEFORE SURGERY and the MORNING OF SURGERY  If you chose to wash your hair, wash your hair first  as usual with your normal shampoo.  After you shampoo, rinse your hair and body thoroughly to remove the shampoo.  Use CHG Soap as you would any other liquid soap. You can apply CHG directly to the skin and wash gently with a scrungie or a clean washcloth.   Apply the CHG Soap to your body ONLY FROM THE NECK DOWN.  Do not use on open wounds or open sores. Avoid contact with your eyes, ears, mouth and genitals (private parts). Wash Face and genitals (private parts)  with your normal soap.   Wash thoroughly, paying special attention to the area where your surgery will be  performed.  Thoroughly rinse your body with warm water from the neck down.  DO NOT shower/wash with your normal soap after using and rinsing off the CHG Soap.  Pat yourself dry with a CLEAN TOWEL.  Wear CLEAN PAJAMAS to bed the night before surgery  Place CLEAN SHEETS on your bed the night before your surgery  DO NOT SLEEP WITH PETS.   Day of Surgery: Shower with CHG soap. Do not wear jewelry, make up, nail polish, gel polish, artificial nails, or any other type of covering on natural nails including finger and toenails. If patients have artificial nails, gel coating, etc. that need to be removed by a nail salon please have this removed prior to surgery. Surgery may need to be canceled/delayed if the surgeon/ anesthesia feels like the patient is unable to be adequately monitored. Do not wear lotions, powders, perfumes, or deodorant. Do not shave 48 hours prior to surgery.  Do not bring valuables to the hospital. [facility] is not responsible for any belongings or valuables. Wear Clean/Comfortable clothing the morning of surgery Remember to brush your teeth WITH YOUR REGULAR TOOTHPASTE.   Please read over the following fact sheets that you were given.

## 2021-06-29 ENCOUNTER — Other Ambulatory Visit: Payer: Self-pay

## 2021-06-29 ENCOUNTER — Encounter (HOSPITAL_COMMUNITY)
Admission: RE | Admit: 2021-06-29 | Discharge: 2021-06-29 | Disposition: A | Discharge status: Home / Self Care | Payer: Medicare PPO | Source: Ambulatory Visit | Attending: General Surgery | Admitting: General Surgery

## 2021-06-29 ENCOUNTER — Encounter (HOSPITAL_COMMUNITY): Payer: Self-pay

## 2021-06-29 DIAGNOSIS — Z01812 Encounter for preprocedural laboratory examination: Secondary | ICD-10-CM | POA: Diagnosis not present

## 2021-06-29 NOTE — Progress Notes (Addendum)
PCP - Ann Held, DO Cardiologist - Denies  PPM/ICD - Denies  Chest x-ray - N/A EKG - N/A Stress Test - Denies ECHO - 03/27/14 Cardiac Cath - Denies  Sleep Study - Denies  Pt denies being diabetic.  Blood Thinner Instructions: N/A Aspirin Instructions: N/A  ERAS Protcol - Yes PRE-SURGERY Ensure or G2- Ensure given  COVID TEST- N/A; Ambulatory sx   Anesthesia review: Review ECHO  Patient denies shortness of breath, fever, cough and chest pain at PAT appointment   All instructions explained to the patient, with a verbal understanding of the material. Patient agrees to go over the instructions while at home for a better understanding. The opportunity to ask questions was provided.

## 2021-06-30 ENCOUNTER — Encounter: Payer: Self-pay | Admitting: General Practice

## 2021-06-30 NOTE — Progress Notes (Signed)
[facility]ual Care Note  Reached Samantha Miranda by phone for [facility] follow-up call. She reports a genuinely upbeat attitude with confidence in her team and plan, and she is eager to have her procedure on Friday. She was also appreciative of the call and reminder of ongoing [facility] care programming and team availability.   [place], [place], [facility] Pager [phone] Voicemail [phone]

## 2021-07-01 DIAGNOSIS — C50811 Malignant neoplasm of overlapping sites of right female breast: Secondary | ICD-10-CM | POA: Diagnosis not present

## 2021-07-01 NOTE — H&P (Signed)
Samantha Miranda is a 75 y.o. female who is seen today as an office consultation at the request of Dr. Etter Sjogren for new breast cancer  She has previous right breast lumpectomy and xrt for dcis in 2007. 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  She has no mass or dc. She has family history of breast cancer in a sister.  She is very hesitant to take antiestrogens and also does not want to do genetics.   Review of Systems: A complete review of systems was obtained from the patient. I have reviewed this information and discussed as appropriate with the patient. See HPI as well for other ROS.  Review of Systems  All other systems reviewed and are negative.   Medical History: Past Medical History:  Diagnosis Date   History of cancer   Hyperlipidemia   Patient Active Problem List  Diagnosis   Hyperlipidemia   Malignant neoplasm of upper-inner quadrant of right breast in female, estrogen receptor positive (CMS-HCC)   Personal history of malignant neoplasm of breast   Past Surgical History:  Procedure Laterality Date   Breast Lumpectomy N/A  2007   HYSTERECTOMY N/A  1992, Complete Hysterectomy    Allergies  Allergen Reactions   Amoxicillin-Pot Clavulanate Nausea And Vomiting   Current Outpatient Medications on File Prior to Visit  Medication Sig Dispense Refill   ezetimibe (ZETIA) 10 mg tablet Take 10 mg by mouth once daily   FLUoxetine (PROZAC) 10 MG tablet Take 1 tablet by mouth once daily   hydrocortisone 2.5 % cream Place rectally   lidocaine (XYLOCAINE) 2 % solution Use as directed 5-10 mLs in the mouth or throat every 4 (four) hours as needed for mouth pain. Do not swallow.   pantoprazole (PROTONIX) 40 MG DR tablet Take by mouth   pitavastatin magnesium (ZYPITAMAG) 2 mg Tab Take by mouth   zolpidem (AMBIEN) 5 MG tablet Take by mouth   No current  facility-administered medications on file prior to visit.   Family History  Problem Relation Age of Onset   Coronary Artery Disease (Blocked arteries around heart) Mother   Lung cancer Father   Breast cancer Sister    Social History   Tobacco Use  Smoking Status Former Smoker   Quit date: 2011   Years since quitting: 11.5  Smokeless Tobacco Never Used    Social History   Socioeconomic History   Marital status: Married  Tobacco Use   Smoking status: Former Smoker  Quit date: 2011  Years since quitting: 11.5   Smokeless tobacco: Never Used  Scientific laboratory technician Use: Never used  Substance and Sexual Activity   Alcohol use: Not Currently   Drug use: Never   Objective:   Physical Exam Constitutional:  Appearance: Normal appearance.  Cardiovascular:  Rate and Rhythm: Normal rate.  Pulmonary:  Effort: Pulmonary effort is normal.  Chest:  Breasts:  Right: Normal. No mass or nipple discharge.  Left: Normal. No mass or nipple discharge.   Lymphadenopathy:  Upper Body:  Right upper body: No supraclavicular or axillary adenopathy.  Left upper body: No supraclavicular or axillary adenopathy.  Neurological:  Mental Status: She is alert.  Psychiatric:  Mood and Affect: Mood normal.  Behavior: Behavior normal.     Assessment and Plan:  Stage I right breast cancer (after prior bct  for dcis)  Right breast seed guided lumpectomy, right ax sn biopsy  We discussed the staging and pathophysiology of breast cancer. We discussed all of the different options for treatment for breast cancer including surgery, chemotherapy, radiation therapy, Herceptin, and antiestrogen therapy. We discussed a sentinel lymph node biopsy as she does not appear to having lymph node involvement right now. We discussed the performance of that with injection of radioactive tracer. We discussed that there is a chance of having a positive node with a sentinel lymph node biopsy and we will await the  permanent pathology to make any other first further decisions in terms of her treatment. We discussed up to a 5% risk lifetime of chronic shoulder pain as well as lymphedema associated with a sentinel lymph node biopsy. I think this is reasonable as she is just over 71 and overall healthy. We did discuss pros and cons of omitting node and elected to proceed.  We discussed the options for treatment of the breast cancer which included lumpectomy versus a mastectomy after prior lumpectomy and xrt for dcis. We discussed the performance of the lumpectomy with radioactive seed placement. We discussed a 5-10% chance of a positive margin requiring reexcision in the operating room. She likely would not get more radiotherapy. We discussed mastectomy and the postoperative care for that as well. Mastectomy can be followed by reconstruction. The decision for lumpectomy vs mastectomy has no impact on decision for chemotherapy.We discussed that there is no difference in her survival whether she undergoes lumpectomy with radiation therapy or antiestrogen therapy versus a mastectomy. There is also no real difference between her recurrence in the breast. I think repeat lumpectomy without xrt is option as long as she is willing to do antiestrogens. After discussion today she is willing to try this. She would like to avoid mastectomy and I think this is reasonable.  We discussed the risks of operation including bleeding, infection, possible reoperation. She understands her further therapy will be based on what her stages at the time of her operation.

## 2021-07-02 ENCOUNTER — Ambulatory Visit (HOSPITAL_COMMUNITY): Payer: Medicare PPO | Admitting: Anesthesiology

## 2021-07-02 ENCOUNTER — Encounter (HOSPITAL_COMMUNITY)
Admission: RE | Disposition: A | Discharge status: Home / Self Care | Payer: Self-pay | Source: Home / Self Care | Attending: General Surgery

## 2021-07-02 ENCOUNTER — Ambulatory Visit (HOSPITAL_COMMUNITY)
Admission: RE | Admit: 2021-07-02 | Discharge: 2021-07-02 | Disposition: A | Discharge status: Home / Self Care | Payer: Medicare PPO | Source: Ambulatory Visit | Attending: General Surgery | Admitting: General Surgery

## 2021-07-02 ENCOUNTER — Other Ambulatory Visit: Payer: Self-pay

## 2021-07-02 ENCOUNTER — Ambulatory Visit (HOSPITAL_COMMUNITY)
Admission: RE | Admit: 2021-07-02 | Discharge: 2021-07-02 | Disposition: A | Discharge status: Home / Self Care | Payer: Medicare PPO | Attending: General Surgery | Admitting: General Surgery

## 2021-07-02 ENCOUNTER — Ambulatory Visit (HOSPITAL_COMMUNITY): Payer: Medicare PPO | Admitting: Physician Assistant

## 2021-07-02 ENCOUNTER — Encounter (HOSPITAL_COMMUNITY): Payer: Self-pay | Admitting: General Surgery

## 2021-07-02 DIAGNOSIS — C50411 Malignant neoplasm of upper-outer quadrant of right female breast: Secondary | ICD-10-CM

## 2021-07-02 DIAGNOSIS — F324 Major depressive disorder, single episode, in partial remission: Secondary | ICD-10-CM | POA: Diagnosis not present

## 2021-07-02 DIAGNOSIS — E785 Hyperlipidemia, unspecified: Secondary | ICD-10-CM | POA: Diagnosis not present

## 2021-07-02 DIAGNOSIS — D0511 Intraductal carcinoma in situ of right breast: Secondary | ICD-10-CM | POA: Diagnosis not present

## 2021-07-02 DIAGNOSIS — Z87891 Personal history of nicotine dependence: Secondary | ICD-10-CM | POA: Diagnosis not present

## 2021-07-02 DIAGNOSIS — G8918 Other acute postprocedural pain: Secondary | ICD-10-CM | POA: Diagnosis not present

## 2021-07-02 DIAGNOSIS — N6011 Diffuse cystic mastopathy of right breast: Secondary | ICD-10-CM | POA: Diagnosis not present

## 2021-07-02 DIAGNOSIS — Z803 Family history of malignant neoplasm of breast: Secondary | ICD-10-CM | POA: Diagnosis not present

## 2021-07-02 DIAGNOSIS — Z880 Allergy status to penicillin: Secondary | ICD-10-CM | POA: Insufficient documentation

## 2021-07-02 DIAGNOSIS — Z801 Family history of malignant neoplasm of trachea, bronchus and lung: Secondary | ICD-10-CM | POA: Diagnosis not present

## 2021-07-02 DIAGNOSIS — Z8249 Family history of ischemic heart disease and other diseases of the circulatory system: Secondary | ICD-10-CM | POA: Diagnosis not present

## 2021-07-02 DIAGNOSIS — Z170 Estrogen receptor positive status [ER+]: Secondary | ICD-10-CM | POA: Diagnosis not present

## 2021-07-02 DIAGNOSIS — C50911 Malignant neoplasm of unspecified site of right female breast: Secondary | ICD-10-CM | POA: Diagnosis not present

## 2021-07-02 DIAGNOSIS — C50111 Malignant neoplasm of central portion of right female breast: Secondary | ICD-10-CM | POA: Diagnosis not present

## 2021-07-02 HISTORY — PX: BREAST LUMPECTOMY WITH RADIOACTIVE SEED AND SENTINEL LYMPH NODE BIOPSY: SHX6550

## 2021-07-02 SURGERY — BREAST LUMPECTOMY WITH RADIOACTIVE SEED AND SENTINEL LYMPH NODE BIOPSY
Anesthesia: Regional | Site: Breast | Laterality: Right

## 2021-07-02 MED ORDER — LACTATED RINGERS IV SOLN: INTRAVENOUS | Status: DC

## 2021-07-02 MED ORDER — FENTANYL CITRATE (PF) 250 MCG/5ML IJ SOLN
INTRAMUSCULAR | Status: AC
  Filled 2021-07-02: qty 5

## 2021-07-02 MED ORDER — BUPIVACAINE-EPINEPHRINE (PF) 0.25% -1:200000 IJ SOLN
INTRAMUSCULAR | Status: AC
  Filled 2021-07-02: qty 30

## 2021-07-02 MED ORDER — SODIUM CHLORIDE (PF) 0.9 % IJ SOLN
INTRAMUSCULAR | Status: AC
  Filled 2021-07-02: qty 10

## 2021-07-02 MED ORDER — HEMOSTATIC AGENTS (NO CHARGE) OPTIME
TOPICAL | Status: DC | PRN
  Administered 2021-07-02: 1 via TOPICAL

## 2021-07-02 MED ORDER — EPHEDRINE SULFATE 50 MG/ML IJ SOLN
INTRAMUSCULAR | Status: DC | PRN
  Administered 2021-07-02 (×2): 10 mg via INTRAVENOUS
  Administered 2021-07-02: 5 mg via INTRAVENOUS

## 2021-07-02 MED ORDER — MIDAZOLAM HCL 2 MG/2ML IJ SOLN
INTRAMUSCULAR | Status: AC
  Filled 2021-07-02: qty 2

## 2021-07-02 MED ORDER — CEFAZOLIN SODIUM-DEXTROSE 2-4 GM/100ML-% IV SOLN
2.0000 g | INTRAVENOUS | Status: AC
  Administered 2021-07-02: 2 g via INTRAVENOUS
  Filled 2021-07-02: qty 100

## 2021-07-02 MED ORDER — HYDROMORPHONE HCL 1 MG/ML IJ SOLN
INTRAMUSCULAR | Status: AC
  Filled 2021-07-02: qty 1

## 2021-07-02 MED ORDER — BUPIVACAINE LIPOSOME 1.3 % IJ SUSP
INTRAMUSCULAR | Status: DC | PRN
  Administered 2021-07-02: 10 mL via PERINEURAL

## 2021-07-02 MED ORDER — ORAL CARE MOUTH RINSE
15.0000 mL | Freq: Once | OROMUCOSAL | Status: AC
Start: 2021-07-02 — End: 2021-07-02

## 2021-07-02 MED ORDER — ONDANSETRON HCL 4 MG/2ML IJ SOLN: 4.0000 mg | Freq: Once | INTRAMUSCULAR | Status: DC | PRN

## 2021-07-02 MED ORDER — OXYCODONE HCL 5 MG/5ML PO SOLN
5.0000 mg | Freq: Once | ORAL | Status: DC | PRN
Start: 2021-07-02 — End: 2021-07-02

## 2021-07-02 MED ORDER — ACETAMINOPHEN 500 MG PO TABS
1000.0000 mg | ORAL_TABLET | Freq: Once | ORAL | Status: AC
  Administered 2021-07-02: 1000 mg via ORAL
  Filled 2021-07-02: qty 2

## 2021-07-02 MED ORDER — DEXAMETHASONE SODIUM PHOSPHATE 10 MG/ML IJ SOLN
INTRAMUSCULAR | Status: DC | PRN
  Administered 2021-07-02: 4 mg via INTRAVENOUS

## 2021-07-02 MED ORDER — ONDANSETRON HCL 4 MG/2ML IJ SOLN
INTRAMUSCULAR | Status: DC | PRN
  Administered 2021-07-02 (×2): 4 mg via INTRAVENOUS

## 2021-07-02 MED ORDER — LIDOCAINE HCL (CARDIAC) PF 100 MG/5ML IV SOSY
PREFILLED_SYRINGE | INTRAVENOUS | Status: DC | PRN
  Administered 2021-07-02: 20 mg via INTRAVENOUS

## 2021-07-02 MED ORDER — OXYCODONE HCL 5 MG PO TABS
5.0000 mg | ORAL_TABLET | Freq: Once | ORAL | Status: DC | PRN
Start: 2021-07-02 — End: 2021-07-02

## 2021-07-02 MED ORDER — PHENYLEPHRINE HCL-NACL 20-0.9 MG/250ML-% IV SOLN
INTRAVENOUS | Status: DC | PRN
  Administered 2021-07-02: 25 ug/min via INTRAVENOUS

## 2021-07-02 MED ORDER — 0.9 % SODIUM CHLORIDE (POUR BTL) OPTIME
TOPICAL | Status: DC | PRN
  Administered 2021-07-02: 1000 mL

## 2021-07-02 MED ORDER — CHLORHEXIDINE GLUCONATE 0.12 % MT SOLN
15.0000 mL | Freq: Once | OROMUCOSAL | Status: AC
  Administered 2021-07-02: 15 mL via OROMUCOSAL
  Filled 2021-07-02: qty 15

## 2021-07-02 MED ORDER — HYDROMORPHONE HCL 1 MG/ML IJ SOLN
0.2500 mg | INTRAMUSCULAR | Status: DC | PRN
  Administered 2021-07-02: 0.25 mg via INTRAVENOUS

## 2021-07-02 MED ORDER — CHLORHEXIDINE GLUCONATE CLOTH 2 % EX PADS: 6.0000 | MEDICATED_PAD | Freq: Once | CUTANEOUS | Status: DC

## 2021-07-02 MED ORDER — PROPOFOL 10 MG/ML IV BOLUS
INTRAVENOUS | Status: AC
  Filled 2021-07-02: qty 20

## 2021-07-02 MED ORDER — ENSURE PRE-SURGERY PO LIQD: 296.0000 mL | Freq: Once | ORAL | Status: DC

## 2021-07-02 MED ORDER — TECHNETIUM TC 99M TILMANOCEPT KIT
1.0000 | PACK | Freq: Once | INTRAVENOUS | Status: AC | PRN
  Administered 2021-07-02: 1 via INTRADERMAL

## 2021-07-02 MED ORDER — FENTANYL CITRATE (PF) 100 MCG/2ML IJ SOLN: 100.0000 ug | Freq: Once | INTRAMUSCULAR | Status: AC

## 2021-07-02 MED ORDER — TRAMADOL HCL 50 MG PO TABS: 100.0000 mg | ORAL_TABLET | Freq: Four times a day (QID) | ORAL | 0 refills | Status: DC | PRN

## 2021-07-02 MED ORDER — BUPIVACAINE HCL (PF) 0.5 % IJ SOLN
INTRAMUSCULAR | Status: DC | PRN
  Administered 2021-07-02: 20 mL via PERINEURAL

## 2021-07-02 MED ORDER — PROPOFOL 10 MG/ML IV BOLUS
INTRAVENOUS | Status: DC | PRN
  Administered 2021-07-02: 100 mg via INTRAVENOUS

## 2021-07-02 MED ORDER — BUPIVACAINE-EPINEPHRINE 0.25% -1:200000 IJ SOLN
INTRAMUSCULAR | Status: DC | PRN
  Administered 2021-07-02: 6 mL

## 2021-07-02 MED ORDER — METHYLENE BLUE 0.5 % INJ SOLN
INTRAVENOUS | Status: AC
  Filled 2021-07-02: qty 10

## 2021-07-02 MED ORDER — AMISULPRIDE (ANTIEMETIC) 5 MG/2ML IV SOLN: 10.0000 mg | Freq: Once | INTRAVENOUS | Status: DC | PRN

## 2021-07-02 MED ORDER — METOCLOPRAMIDE HCL 5 MG/ML IJ SOLN
INTRAMUSCULAR | Status: DC | PRN
  Administered 2021-07-02: 10 mg via INTRAVENOUS

## 2021-07-02 MED ORDER — FENTANYL CITRATE (PF) 100 MCG/2ML IJ SOLN
INTRAMUSCULAR | Status: AC
  Administered 2021-07-02: 100 ug via INTRAVENOUS
  Filled 2021-07-02: qty 2

## 2021-07-02 SURGICAL SUPPLY — 43 items
APPLIER CLIP 9.375 MED OPEN (MISCELLANEOUS)
BAG COUNTER SPONGE SURGICOUNT (BAG) ×2 IMPLANT
BINDER BREAST LRG (GAUZE/BANDAGES/DRESSINGS) ×2 IMPLANT
BINDER BREAST XLRG (GAUZE/BANDAGES/DRESSINGS) IMPLANT
CANISTER SUCT 3000ML PPV (MISCELLANEOUS) ×2 IMPLANT
CHLORAPREP W/TINT 26 (MISCELLANEOUS) ×2 IMPLANT
CLIP APPLIE 9.375 MED OPEN (MISCELLANEOUS) IMPLANT
CLIP VESOCCLUDE MED 6/CT (CLIP) ×2 IMPLANT
COVER PROBE W GEL 5X96 (DRAPES) ×2 IMPLANT
COVER SURGICAL LIGHT HANDLE (MISCELLANEOUS) ×2 IMPLANT
DERMABOND ADVANCED (GAUZE/BANDAGES/DRESSINGS) ×1
DERMABOND ADVANCED .7 DNX12 (GAUZE/BANDAGES/DRESSINGS) ×1 IMPLANT
DEVICE DUBIN SPECIMEN MAMMOGRA (MISCELLANEOUS) ×2 IMPLANT
DRAPE CHEST BREAST 15X10 FENES (DRAPES) ×2 IMPLANT
DRSG PAD ABDOMINAL 8X10 ST (GAUZE/BANDAGES/DRESSINGS) ×2 IMPLANT
ELECT COATED BLADE 2.86 ST (ELECTRODE) ×2 IMPLANT
ELECT REM PT RETURN 9FT ADLT (ELECTROSURGICAL) ×2
ELECTRODE REM PT RTRN 9FT ADLT (ELECTROSURGICAL) ×1 IMPLANT
GLOVE SURG ENC MOIS LTX SZ7 (GLOVE) ×2 IMPLANT
GLOVE SURG UNDER POLY LF SZ7.5 (GLOVE) ×2 IMPLANT
GOWN STRL REUS W/ TWL LRG LVL3 (GOWN DISPOSABLE) ×2 IMPLANT
GOWN STRL REUS W/TWL LRG LVL3 (GOWN DISPOSABLE) ×4
HEMOSTAT ARISTA ABSORB 3G PWDR (HEMOSTASIS) ×2 IMPLANT
ILLUMINATOR WAVEGUIDE N/F (MISCELLANEOUS) IMPLANT
KIT BASIN OR (CUSTOM PROCEDURE TRAY) ×2 IMPLANT
KIT MARKER MARGIN INK (KITS) ×2 IMPLANT
NEEDLE 18GX1X1/2 (RX/OR ONLY) (NEEDLE) IMPLANT
NEEDLE FILTER BLUNT 18X 1/2SAF (NEEDLE)
NEEDLE FILTER BLUNT 18X1 1/2 (NEEDLE) IMPLANT
NEEDLE HYPO 25GX1X1/2 BEV (NEEDLE) ×2 IMPLANT
NS IRRIG 1000ML POUR BTL (IV SOLUTION) ×2 IMPLANT
PACK GENERAL/GYN (CUSTOM PROCEDURE TRAY) ×2 IMPLANT
STRIP CLOSURE SKIN 1/2X4 (GAUZE/BANDAGES/DRESSINGS) ×2 IMPLANT
SUT MNCRL AB 4-0 PS2 18 (SUTURE) ×4 IMPLANT
SUT MON AB 5-0 PS2 18 (SUTURE) IMPLANT
SUT SILK 2 0 SH (SUTURE) IMPLANT
SUT VIC AB 2-0 SH 27 (SUTURE) ×4
SUT VIC AB 2-0 SH 27XBRD (SUTURE) ×2 IMPLANT
SUT VIC AB 3-0 SH 27 (SUTURE) ×4
SUT VIC AB 3-0 SH 27X BRD (SUTURE) ×2 IMPLANT
SYR CONTROL 10ML LL (SYRINGE) ×2 IMPLANT
TOWEL GREEN STERILE (TOWEL DISPOSABLE) ×2 IMPLANT
TOWEL GREEN STERILE FF (TOWEL DISPOSABLE) ×2 IMPLANT

## 2021-07-02 NOTE — Op Note (Signed)
Preoperative diagnosis: Clinical stage I right breast cancer Postoperative diagnosis: Same as above Procedure: 1.  Right breast radioactive seed guided lumpectomy 2.  Right axillary sentinel lymph node biopsy, deep Surgeon: Dr. Serita Grammes Anesthesia: General with a pectoral block Estimated blood loss: 20 cc Complications: None Drains: None Specimens: 1.  Right breast tissue marked with paint 2.  Additional inferior, lateral and posterior medial margins marked short superior, long lateral, double deep 3.  Right deep axillary sentinel lymph nodes with highest count of 1125 Sponge and count was correct completion Decision to recovery stable condition   Indications:  75 y.o. female with previous right breast lumpectomy and xrt for dcis in 2007. 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    Procedure: After informed consent was obtained the patient first underwent a pectoral block.  She was also injected with Lymphoseek in the standard periareolar fashion.  She was given antibiotics.  SCDs were placed.  She was then placed under general anesthesia without complication.  She was prepped and draped in standard sterile surgical fashion.  Surgical timeout was then performed.   The seed was in the medial breast.  I infiltrated Marcaine and made a  linear incision with a paddle of skin overlying the seed to ensure a clear margin. This was just an extension of her prior incision.   I then dissected to the seed.  I removed the seed and the surrounding tissue in order to get a clear margin.  I then marked this with paint and passed off the table.  The seed and clip were present in the specimen.  I then removed additional margins as above as these appeared close to me. The posterior margin is now the muscle.  I then closed the cavity with 2-0 Vicryl.  The skin was closed with 3-0  Vicryl 4-0 Monocryl.  Glue and Steri-Strips were later applied.  I was able to identify radioactivity in the axilla.  I then infiltrated Marcaine and made an incision.  I carried this through the axillary fascia.  I was able to identify what appeared to be a couple of radioactive nodes.   I removed these and passed these off the table.  There is no real background radioactivity.  I obtained hemostasis.  I then closed the axillary fascia with 2-0 Vicryl.  The skin was closed with 3-0 Vicryl and 4-0 Monocryl.  Glue and Steri-Strips were applied.  She tolerated this well was extubated and transferred to recovery stable

## 2021-07-02 NOTE — Anesthesia Postprocedure Evaluation (Signed)
Anesthesia Post Note  Patient: Samantha Miranda  Procedure(s) Performed: RIGHT BREAST LUMPECTOMY WITH RADIOACTIVE SEED AND RIGHT AXILLARY SENTINEL LYMPH NODE BIOPSY (Right: Breast)     Patient location during evaluation: PACU Anesthesia Type: Regional and General Level of consciousness: awake and alert, oriented and patient cooperative Pain management: pain level controlled Vital Signs Assessment: post-procedure vital signs reviewed and stable Respiratory status: spontaneous breathing, nonlabored ventilation and respiratory function stable Cardiovascular status: blood pressure returned to baseline and stable Postop Assessment: no apparent nausea or vomiting Anesthetic complications: no   No notable events documented.  Last Vitals:  Vitals:   07/02/21 1035 07/02/21 1050  BP: (!) 126/56 (!) 106/52  Pulse: 76 76  Resp: 17 16  Temp:    SpO2: 97% 100%    Last Pain:  Vitals:   07/02/21 1035  TempSrc:   PainSc: [place]

## 2021-07-02 NOTE — Anesthesia Procedure Notes (Signed)
Procedure Name: LMA Insertion Date/Time: 07/02/2021 8:56 AM Performed by: Rayvon Char, CRNA Pre-anesthesia Checklist: Patient identified, Emergency Drugs available, Suction available and Patient being monitored Patient Re-evaluated:Patient Re-evaluated prior to induction Oxygen Delivery Method: Circle system utilized Preoxygenation: Pre-oxygenation with 100% oxygen Induction Type: IV induction Ventilation: Mask ventilation without difficulty LMA: LMA inserted LMA Size: 3.0 Number of attempts: 1 Dental Injury: Teeth and Oropharynx as per pre-operative assessment

## 2021-07-02 NOTE — Transfer of Care (Signed)
Immediate Anesthesia Transfer of Care Note  Patient: Samantha Miranda  Procedure(s) Performed: RIGHT BREAST LUMPECTOMY WITH RADIOACTIVE SEED AND RIGHT AXILLARY SENTINEL LYMPH NODE BIOPSY (Right: Breast)  Patient Location: PACU  Anesthesia Type:General  Level of Consciousness: awake, alert  and oriented  Airway & Oxygen Therapy: Patient Spontanous Breathing and Patient connected to nasal cannula oxygen  Post-op Assessment: Report given to RN and Post -op Vital signs reviewed and stable  Post vital signs: Reviewed and stable  Last Vitals:  Vitals Value Taken Time  BP 127/70 07/02/21 1006  Temp    Pulse 77 07/02/21 1007  Resp 17 07/02/21 1007  SpO2 100 % 07/02/21 1007  Vitals shown include unvalidated device data.  Last Pain:  Vitals:   07/02/21 0836  TempSrc:   PainSc: 0-No pain         Complications: No notable events documented.

## 2021-07-02 NOTE — Interval H&P Note (Signed)
History and Physical Interval Note:  07/02/2021 6:58 AM  Samantha Miranda  has presented today for surgery, with the diagnosis of RIGHT BREAST CANCER.  The various methods of treatment have been discussed with the patient and family. After consideration of risks, benefits and other options for treatment, the patient has consented to  Procedure(s): RIGHT BREAST LUMPECTOMY WITH RADIOACTIVE SEED AND RIGHT AXILLARY SENTINEL LYMPH NODE BIOPSY (Right) as a surgical intervention.  The patient's history has been reviewed, patient examined, no change in status, stable for surgery.  I have reviewed the patient's chart and labs.  Questions were answered to the patient's satisfaction.     Rolm Bookbinder

## 2021-07-02 NOTE — Anesthesia Preprocedure Evaluation (Addendum)
Anesthesia Evaluation  Patient identified by MRN, date of birth, ID band Patient awake    Reviewed: Allergy & Precautions, NPO status , Patient's Chart, lab work & pertinent test results  Airway Mallampati: I  TM Distance: >3 FB Neck ROM: Full    Dental no notable dental hx. (+) Teeth Intact, Dental Advisory Given   Pulmonary COPD, former smoker,  Former smoker, quit 2012, 20 pack year history    Pulmonary exam normal breath sounds clear to auscultation       Cardiovascular negative cardio ROS Normal cardiovascular exam Rhythm:Regular Rate:Normal     Neuro/Psych PSYCHIATRIC DISORDERS Depression negative neurological ROS     GI/Hepatic Neg liver ROS, PUD, GERD  Controlled and Medicated,  Endo/Other  negative endocrine ROS  Renal/GU negative Renal ROS  negative genitourinary   Musculoskeletal negative musculoskeletal ROS (+)   Abdominal   Peds  Hematology negative hematology ROS (+) hct 40.6   Anesthesia Other Findings   Reproductive/Obstetrics negative OB ROS                            Anesthesia Physical Anesthesia Plan  ASA: 2  Anesthesia Plan: General and Regional   Post-op Pain Management: GA combined w/ Regional for post-op pain   Induction: Intravenous  PONV Risk Score and Plan: 3 and Ondansetron, Dexamethasone, Midazolam and Treatment may vary due to age or medical condition  Airway Management Planned: LMA  Additional Equipment: None  Intra-op Plan:   Post-operative Plan: Extubation in OR  Informed Consent: I have reviewed the patients History and Physical, chart, labs and discussed the procedure including the risks, benefits and alternatives for the proposed anesthesia with the patient or authorized representative who has indicated his/her understanding and acceptance.     Dental advisory given  Plan Discussed with: CRNA  Anesthesia Plan Comments:         Anesthesia Quick Evaluation

## 2021-07-02 NOTE — Anesthesia Procedure Notes (Signed)
Anesthesia Regional Block: Pectoralis block   Pre-Anesthetic Checklist: , timeout performed,  Correct Patient, Correct Site, Correct Laterality,  Correct Procedure, Correct Position, site marked,  Risks and benefits discussed,  Surgical consent,  Pre-op evaluation,  At surgeon's request and post-op pain management  Laterality: Right  Prep: Maximum Sterile Barrier Precautions used, chloraprep       Needles:  Injection technique: Single-shot  Needle Type: Echogenic Stimulator Needle     Needle Length: 9cm  Needle Gauge: 22     Additional Needles:   Procedures:,,,, ultrasound used (permanent image in chart),,    Narrative:  Start time: 07/02/2021 8:10 AM End time: 07/02/2021 8:15 AM Injection made incrementally with aspirations every 5 mL.  Performed by: Personally  Anesthesiologist: Pervis Hocking, DO  Additional Notes: Monitors applied. No increased pain on injection. No increased resistance to injection. Injection made in 5cc increments. Good needle visualization. Patient tolerated procedure well.

## 2021-07-02 NOTE — Discharge Instructions (Signed)
[facility] Office Phone Number [phone]  BREAST BIOPSY/ PARTIAL MASTECTOMY: POST OP INSTRUCTIONS Take 400 mg of ibuprofen every 8 hours or 650 mg tylenol every 6 hours for next 72 hours then as needed. Use ice several times daily also. Always review your discharge instruction sheet given to you by the facility where your surgery was performed.  IF YOU HAVE DISABILITY OR FAMILY LEAVE FORMS, YOU MUST BRING THEM TO THE OFFICE FOR PROCESSING.  DO NOT GIVE THEM TO YOUR DOCTOR.  A prescription for pain medication may be given to you upon discharge.  Take your pain medication as prescribed, if needed.  If narcotic pain medicine is not needed, then you may take acetaminophen (Tylenol), naprosyn (Alleve) or ibuprofen (Advil) as needed. Take your usually prescribed medications unless otherwise directed If you need a refill on your pain medication, please contact your pharmacy.  They will contact our office to request authorization.  Prescriptions will not be filled after 5pm or on week-ends. You should eat very light the first 24 hours after surgery, such as soup, crackers, pudding, etc.  Resume your normal diet the day after surgery. Most patients will experience some swelling and bruising in the breast.  Ice packs and a good support bra will help.  Wear the breast binder provided or a sports bra for 72 hours day and night.  After that wear a sports bra during the day until you return to the office. Swelling and bruising can take several days to resolve.  It is common to experience some constipation if taking pain medication after surgery.  Increasing fluid intake and taking a stool softener will usually help or prevent this problem from occurring.  A mild laxative (Milk of Magnesia or Miralax) should be taken according to package directions if there are no bowel movements after 48 hours. Unless discharge instructions indicate otherwise, you may remove your bandages 48 hours after surgery  and you may shower at that time.  You may have steri-strips (small skin tapes) in place directly over the incision.  These strips should be left on the skin for 7-10 days and will come off on their own.  If your surgeon used skin glue on the incision, you may shower in 24 hours.  The glue will flake off over the next 2-3 weeks.  Any sutures or staples will be removed at the office during your follow-up visit. ACTIVITIES:  You may resume regular daily activities (gradually increasing) beginning the next day.  Wearing a good support bra or sports bra minimizes pain and swelling.  You may have sexual intercourse when it is comfortable. You may drive when you no longer are taking prescription pain medication, you can comfortably wear a seatbelt, and you can safely maneuver your car and apply brakes. RETURN TO WORK:  ______________________________________________________________________________________ You should see your doctor in the office for a follow-up appointment approximately two weeks after your surgery.  Your doctor's nurse will typically make your follow-up appointment when she calls you with your pathology report.  Expect your pathology report 3-4 business days after your surgery.  You may call to check if you do not hear from us after three days. OTHER INSTRUCTIONS: _______________________________________________________________________________________________ _____________________________________________________________________________________________________________________________________ _____________________________________________________________________________________________________________________________________ _____________________________________________________________________________________________________________________________________  WHEN TO CALL DR Keneisha Heckart: Fever over 101.0 Nausea and/or vomiting. Extreme swelling or bruising. Continued bleeding from incision. Increased  pain, redness, or drainage from the incision.  The clinic staff is available to answer your questions during regular business hours.  Please don't hesitate to call   and ask to speak to one of the nurses for clinical concerns.  If you have a medical emergency, go to the nearest emergency room or call 911.  A surgeon from [facility] is always on call at the hospital.  For further questions, please visit [email or web address]  

## 2021-07-03 ENCOUNTER — Encounter (HOSPITAL_COMMUNITY): Payer: Self-pay | Admitting: General Surgery

## 2021-07-08 ENCOUNTER — Telehealth: Payer: Self-pay | Admitting: *Deleted

## 2021-07-08 ENCOUNTER — Encounter: Payer: Self-pay | Admitting: *Deleted

## 2021-07-08 LAB — SURGICAL PATHOLOGY

## 2021-07-08 NOTE — Telephone Encounter (Signed)
Received order for oncotype testing. Requisition faxed to pathology and [facility] °

## 2021-07-16 DIAGNOSIS — C50111 Malignant neoplasm of central portion of right female breast: Secondary | ICD-10-CM | POA: Diagnosis not present

## 2021-07-16 DIAGNOSIS — Z170 Estrogen receptor positive status [ER+]: Secondary | ICD-10-CM | POA: Diagnosis not present

## 2021-07-20 ENCOUNTER — Telehealth: Payer: Self-pay | Admitting: *Deleted

## 2021-07-20 ENCOUNTER — Other Ambulatory Visit: Payer: Self-pay | Admitting: *Deleted

## 2021-07-20 NOTE — Telephone Encounter (Signed)
Called pt with appt to see Dr. Burr Medico on 8/31 at 2:40pm. No further needs or questions voiced at this time.

## 2021-07-21 ENCOUNTER — Inpatient Hospital Stay: Payer: Medicare PPO | Admitting: Hematology

## 2021-07-21 ENCOUNTER — Encounter: Payer: Self-pay | Admitting: Hematology

## 2021-07-21 ENCOUNTER — Other Ambulatory Visit: Payer: Self-pay

## 2021-07-21 VITALS — BP 152/76 | HR 91 | Resp 18 | Wt 122.5 lb

## 2021-07-21 DIAGNOSIS — C50111 Malignant neoplasm of central portion of right female breast: Secondary | ICD-10-CM

## 2021-07-21 DIAGNOSIS — Z171 Estrogen receptor negative status [ER-]: Secondary | ICD-10-CM

## 2021-07-21 NOTE — Progress Notes (Signed)
Samantha Miranda   [phone] [phone]   Clinic Follow up Note   Patient Care Team: Carollee Herter, Alferd Apa, DO as PCP - General Shirley Muscat Loreen Freud, MD as Referring Physician (Optometry) Izora Ribas (Dermatology) Mauro Kaufmann, RN as Oncology Nurse Navigator Rockwell Germany, RN as Oncology Nurse Navigator Rolm Bookbinder, MD as Consulting Physician (General Surgery) Truitt Merle, MD as Consulting Physician (Hematology) Kyung Rudd, MD as Consulting Physician (Radiation Oncology)  Date of Service:  07/21/2021  CHIEF COMPLAINT: f/u of right breast cancer  CURRENT THERAPY:  Pending  ASSESSMENT & PLAN:  Samantha Miranda is a 75 y.o. female with   1. 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 Biopsy on 06/17/21 revealed grade 1 IDC with DCIS, ER+, PR+, Her2- -lumpectomy on 07/02/21 by Dr. Donne Hazel showed invasive and in situ carcinoma, 1.2 cm, grade 1. Margins and all five lymph nodes were negative. -given her previous right breast radiation, additional radiation is not recommended. -Oncotype DX was obtained on the final surgical sample and the recurrence score of 28 predicts a risk of recurrence outside the breast over the next 9 years of 17%, if the patient's only systemic therapy is an antiestrogen for 5 years.  It also predicts a significant benefit from chemotherapy. Based on this, I recommend adjuvant chemotherapy to reduce her risk of recurrence. -We discussed the different chemotherapy regimen options.  I recommend standard docetaxel and Cytoxan every 3 weeks for 4 cycles.  She is very hesitant to consider chemo because of her sister's poor tolerance to chemo in the past.  We discussed the differences between treatment options, their impact on her recurrence, and their side effects. I encouraged her to at least try weekly Taxol for 12 weeks  as an alternative. She will consider her options and talk them over with her husband. -I also discussed the option of DigniCap to preserve her hair when she is on chemo -Due to her advanced age, relatively not very high RS,if she declines chemo, it's understandable and acceptable  -I will have our nurse navigator team reach out to her next week to make a decision.  2. H/o Right Breast DCIS, ER+/PR+ -diagnosed in 2007. Underwent surgery and radiation therapy. She did not receive antiestrogen therapy.   3. Bone Health  -She has had DEXA before, but she does not remember the date and we do not have it on file.   4. Genetic Testing -due to her strong family history of cancer, she is eligible for genetic testing.  -I recommended this given she has a daughter. She declines, as she does not want to know.  5. Social Support -her sister had breast cancer, was treated with chemo and radiation, and ultimately still died from it. -she is estranged from her daughter due to her daughter's dug use/addiction.  -she has support at home from her husband.     PLAN:  -I discussed her Oncotype result, and my recommendation for adjuvant chemotherapy TC X4 or weekly TaxolX12  will have our nurse navigator reach out to her with a decision next week -f/u pending   No problem-specific Assessment & Plan notes found for this encounter.   SUMMARY OF ONCOLOGIC HISTORY: Oncology History Overview Note  Cancer Staging 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 [facility]) Staging form: Breast, AJCC 8th Edition - Clinical stage from 06/21/2021: Stage IA (cT1b,  cN0, cM0, G1, ER+, PR+, HER2-) - Signed by Truitt Merle, MD on 06/23/2021 Stage prefix: Initial diagnosis Histologic grading system: 3 grade system - Pathologic stage from 07/02/2021: Stage IA (pT1c, pN0, cM0, G1, ER+, PR+, HER2-, Oncotype DX score: 28) - Signed by Truitt Merle, MD on 07/21/2021 Stage prefix: Initial diagnosis Multigene prognostic tests performed:  Oncotype DX Recurrence score range: Greater than or equal to 11 Histologic grading system: 3 grade system     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 ([place])  06/10/2021 Mammogram   Right Diagnostic Mammogram; Right Breast Ultrasound  IMPRESSION:  The 1 cm x 1 cm x 1 cm irregular mass in the right breast is suspicious of malignancy.   06/17/2021 Pathology Results   Diagnosis Breast, right, needle core biopsy, 3:00, 4 cmfn  - INVASIVE MAMMARY CARCINOMA, SEE COMMENT  - MAMMARY CARCINOMA IN SITU. The carcinoma appears grade 1. E-cadherin is positive in the invasive and in situ carcinoma consistent with a ductal phenotype.  PROGNOSTIC INDICATORS Results: IMMUNOHISTOCHEMICAL AND MORPHOMETRIC ANALYSIS PERFORMED MANUALLY The tumor cells are EQUIVOCAL for HER2 (2+). Her2 by FISH will be performed and results reported separately. Estrogen Receptor: 90%, POSITIVE, STRONG STAINING INTENSITY Progesterone Receptor: 5%, POSITIVE, STRONG STAINING INTENSITY Proliferation Marker Ki67: 10%  Her2 negative by [facility]   06/21/2021 Cancer Staging   Staging form: Breast, AJCC 8th Edition - Clinical stage from 06/21/2021: Stage IA (cT1b, cN0, cM0, G1, ER+, PR+, HER2-) - Signed by Truitt Merle, MD on 06/23/2021 Stage prefix: Initial diagnosis Histologic grading system: 3 grade system   06/23/2021 Initial Diagnosis   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 ([place])   07/02/2021 Pathology Results   FINAL MICROSCOPIC DIAGNOSIS:   A. BREAST, RIGHT, LUMPECTOMY:  - Invasive and in situ ductal carcinoma, 1.2 cm.  - Carcinoma is 0.7 cm from superior margin.  - Biopsy site and biopsy clip.  - See oncology table.   B. BREAST, RIGHT ADDITIONAL INFERIOR MARGIN, EXCISION:  - Fibrocystic changes.  - Final inferior margin negative for carcinoma.   C. LYMPH NODE, RIGHT AXILLARY, SENTINEL, EXCISION:  - One lymph node with capsular nevus and negative for metastatic  carcinoma (0/1).   D. LYMPH NODE, RIGHT  AXILLARY, SENTINEL, EXCISION:  - One lymph node negative for metastatic carcinoma (0/1).   E. LYMPH NODE, RIGHT AXILLARY, SENTINEL, EXCISION:  - One lymph node negative for metastatic carcinoma (0/1).   F. LYMPH NODE, RIGHT AXILLARY, SENTINEL, EXCISION:  - One lymph node negative for metastatic carcinoma (0/1).   G. LYMPH NODE, RIGHT AXILLARY, SENTINEL, EXCISION:  - One lymph node negative for metastatic carcinoma (0/1).   H. BREAST, RIGHT ADDITIONAL LATERAL MARGIN, EXCISION:  - Fibrocystic changes.  - Final lateral margin negative for carcinoma.   I. BREAST, RIGHT ADDITIONAL POSTERIOR MARGIN, EXCISION:  - Benign breast tissue.  - Final posterior margin negative for carcinoma.    07/02/2021 Cancer Staging   Staging form: Breast, AJCC 8th Edition - Pathologic stage from 07/02/2021: Stage IA (pT1c, pN0, cM0, G1, ER+, PR+, HER2-, Oncotype DX score: 28) - Signed by Truitt Merle, MD on 07/21/2021 Stage prefix: Initial diagnosis Multigene prognostic tests performed: Oncotype DX Recurrence score range: Greater than or equal to 11 Histologic grading system: 3 grade system      INTERVAL HISTORY:  Samantha Miranda is here for a follow up of breast cancer. She was last seen by me on 06/23/21 in consultation. She presents to the clinic alone. She reports she is  recovering from surgery.   All other systems were reviewed with the patient and are negative.  MEDICAL HISTORY:  Past Medical History:  Diagnosis Date   Barrett's esophagus    Basal cell carcinoma of left lower leg    Breast cancer ([place])    right   COPD (chronic obstructive pulmonary disease) ([place]) 2018   Depression 1989   Gastric ulcer    GERD (gastroesophageal reflux disease)    History of colon polyps    Hyperlipidemia    Osteopenia     SURGICAL HISTORY: Past Surgical History:  Procedure Laterality Date   ABDOMINAL HYSTERECTOMY  11/21/1989   APPENDECTOMY  1969   BREAST LUMPECTOMY  07/22/2006   BREAST LUMPECTOMY WITH  RADIOACTIVE SEED AND SENTINEL LYMPH NODE BIOPSY Right 07/02/2021   Procedure: RIGHT BREAST LUMPECTOMY WITH RADIOACTIVE SEED AND RIGHT AXILLARY SENTINEL LYMPH NODE BIOPSY;  Surgeon: Rolm Bookbinder, MD;  Location: [place];  Service: General;  Laterality: Right;   CATARACT EXTRACTION, BILATERAL     EYE SURGERY  08/2013  10/2013   cataracts   TONSILLECTOMY      I have reviewed the social history and family history with the patient and they are unchanged from previous note.  ALLERGIES:  is allergic to amoxicillin-pot clavulanate and moxifloxacin.  MEDICATIONS:  Current Outpatient Medications  Medication Sig Dispense Refill   Biotin w/ Vitamins C & E (HAIR SKIN & NAILS GUMMIES PO) Take 2 each by mouth daily.     Calcium-Magnesium-Zinc (CAL-MAG-ZINC PO) Take 2 tablets by mouth daily.     cholecalciferol (VITAMIN D3) 25 MCG (1000 UNIT) tablet Take 1,000 Units by mouth daily.     ezetimibe (ZETIA) 10 MG tablet TAKE 1 TABLET(10 MG) BY MOUTH DAILY (Patient taking differently: Take 10 mg by mouth daily. TAKE 1 TABLET(10 MG) BY MOUTH DAILY) 90 tablet 1   FLUoxetine (PROZAC) 10 MG tablet Take 1 tablet (10 mg total) by mouth daily. 90 tablet 1   fluticasone (FLONASE) 50 MCG/ACT nasal spray Place 2 sprays into both nostrils daily. (Patient taking differently: Place 2 sprays into both nostrils daily as needed for allergies.) 16 g 5   hydrocortisone (ANUSOL-HC) 2.5 % rectal cream Place 1 application rectally 2 (two) times daily as needed for hemorrhoids or anal itching. 30 g 1   Misc Natural Products (CHOLESTEROL SUPPORT PO) Take 2 capsules by mouth daily.     Omega-3 Fatty Acids (FISH OIL) 1200 MG CAPS Take 1,200 mg by mouth daily.     pantoprazole (PROTONIX) 40 MG tablet Take 1 tablet (40 mg total) by mouth daily before breakfast. 90 tablet 3   Pitavastatin Magnesium (ZYPITAMAG) 2 MG TABS Take 1 mg by mouth daily. (Patient not taking: No sig reported) 30 tablet 2   traMADol (ULTRAM) 50 MG tablet Take 2  tablets (100 mg total) by mouth every 6 (six) hours as needed. 10 tablet 0   vitamin C (ASCORBIC ACID) 250 MG tablet Take 250 mg by mouth daily.     XIIDRA 5 % SOLN Place 1 drop into both eyes in the morning and at bedtime.  1   zolpidem (AMBIEN) 5 MG tablet Take 1 tablet (5 mg total) by mouth at bedtime as needed for sleep. 15 tablet 1   No current facility-administered medications for this visit.    PHYSICAL EXAMINATION: ECOG PERFORMANCE STATUS: 1 - Symptomatic but completely ambulatory  Vitals:   07/21/21 1447  BP: (!) 152/76  Pulse: 91  Resp: 18  SpO2: 94%  Wt Readings from Last 3 Encounters:  07/21/21 122 lb 8 oz (55.6 kg)  07/02/21 121 lb 3.2 oz (55 kg)  06/29/21 121 lb 3.2 oz (55 kg)     GENERAL:alert, no distress and comfortable SKIN: skin color, texture, turgor are normal, no rashes or significant lesions EYES: normal, Conjunctiva are pink and non-injected, sclera clear  NECK: supple, thyroid normal size, non-tender, without nodularity LYMPH:  no palpable lymphadenopathy in the cervical, axillary  LUNGS: clear to auscultation and percussion with normal breathing effort HEART: regular rate & rhythm and no murmurs and no lower extremity edema ABDOMEN:abdomen soft, non-tender and normal bowel sounds Musculoskeletal:no cyanosis of digits and no clubbing  NEURO: alert & oriented x 3 with fluent speech, no focal motor/sensory deficits BREAST: right breast incision healing well; No palpable mass, nodules or adenopathy bilaterally. Breast exam benign.   LABORATORY DATA:  I have reviewed the data as listed CBC Latest Ref Rng & Units 06/23/2021 08/04/2017 06/05/2017  WBC 4.0 - 10.5 K/uL 5.6 5.7 5.8  Hemoglobin 12.0 - 15.0 g/dL 13.5 13.0 14.0  Hematocrit 36.0 - 46.0 % 40.6 38.5 41.1  Platelets 150 - 400 K/uL 174 210 200.0     CMP Latest Ref Rng & Units 06/23/2021 05/17/2021 01/18/2021  Glucose 70 - 99 mg/dL 82 86 78  BUN 8 - 23 mg/dL _0 Creatinine 0.44 - 1.00 mg/dL 0.99  0.94 0.89  Sodium 135 - 145 mmol/L 141 139 140  Potassium 3.5 - 5.1 mmol/L 4.2 4.4 4.0  Chloride 98 - 111 mmol/L 106 105 103  CO2 22 - 32 mmol/L _1 Calcium 8.9 - 10.3 mg/dL 9.6 9.2 9.4  Total Protein 6.5 - 8.1 g/dL 7.1 6.8 6.9  Total Bilirubin 0.3 - 1.2 mg/dL 0.5 0.5 0.5  Alkaline Phos 38 - 126 U/L 49 50 47  AST 15 - 41 U/L _2 ALT 0 - 44 U/L _3 RADIOGRAPHIC STUDIES: I have personally reviewed the radiological images as listed and agreed with the findings in the report. No results found.    No orders of the defined types were placed in this encounter.  All questions were answered. The patient knows to call the clinic with any problems, questions or concerns. No barriers to learning was detected. The total time spent in the appointment was 40 minutes.     Truitt Merle, MD 07/21/2021   I, Wilburn Mylar, am acting as scribe for Truitt Merle, MD.   I have reviewed the above documentation for accuracy and completeness, and I agree with the above.

## 2021-07-22 ENCOUNTER — Ambulatory Visit (INDEPENDENT_AMBULATORY_CARE_PROVIDER_SITE_OTHER): Payer: Medicare PPO | Admitting: Family Medicine

## 2021-07-22 ENCOUNTER — Other Ambulatory Visit: Payer: Self-pay | Admitting: Family Medicine

## 2021-07-22 ENCOUNTER — Encounter (HOSPITAL_COMMUNITY): Payer: Self-pay | Admitting: Hematology

## 2021-07-22 ENCOUNTER — Encounter: Payer: Self-pay | Admitting: Family Medicine

## 2021-07-22 ENCOUNTER — Encounter: Payer: Self-pay | Admitting: *Deleted

## 2021-07-22 VITALS — BP 118/70 | HR 90 | Temp 98.3°F | Resp 18 | Ht 63.5 in | Wt 120.0 lb

## 2021-07-22 DIAGNOSIS — E785 Hyperlipidemia, unspecified: Secondary | ICD-10-CM

## 2021-07-22 DIAGNOSIS — K219 Gastro-esophageal reflux disease without esophagitis: Secondary | ICD-10-CM | POA: Diagnosis not present

## 2021-07-22 DIAGNOSIS — Z23 Encounter for immunization: Secondary | ICD-10-CM

## 2021-07-22 DIAGNOSIS — G4700 Insomnia, unspecified: Secondary | ICD-10-CM | POA: Insufficient documentation

## 2021-07-22 DIAGNOSIS — Z0000 Encounter for general adult medical examination without abnormal findings: Secondary | ICD-10-CM | POA: Diagnosis not present

## 2021-07-22 DIAGNOSIS — Z8711 Personal history of peptic ulcer disease: Secondary | ICD-10-CM | POA: Diagnosis not present

## 2021-07-22 LAB — LIPID PANEL
Cholesterol: 220 mg/dL — ABNORMAL HIGH (ref 0–200)
HDL: 65.7 mg/dL (ref >39.00)
LDL Cholesterol: 135 mg/dL — ABNORMAL HIGH (ref 0–99)
NonHDL: 153.82
Total CHOL/HDL Ratio: 3
Triglycerides: 95 mg/dL (ref 0.0–149.0)
VLDL: 19 mg/dL (ref 0.0–40.0)

## 2021-07-22 LAB — COMPREHENSIVE METABOLIC PANEL
ALT: 12 U/L (ref 0–35)
AST: 19 U/L (ref 0–37)
Albumin: 4.1 g/dL (ref 3.5–5.2)
Alkaline Phosphatase: 51 U/L (ref 39–117)
BUN: 17 mg/dL (ref 6–23)
CO2: 29 mEq/L (ref 19–32)
Calcium: 9.7 mg/dL (ref 8.4–10.5)
Chloride: 104 mEq/L (ref 96–112)
Creatinine, Ser: 0.99 mg/dL (ref 0.40–1.20)
GFR: 56.23 mL/min — ABNORMAL LOW (ref >60.00)
Glucose, Bld: 88 mg/dL (ref 70–99)
Potassium: 4 mEq/L (ref 3.5–5.1)
Sodium: 141 mEq/L (ref 135–145)
Total Bilirubin: 0.5 mg/dL (ref 0.2–1.2)
Total Protein: 6.9 g/dL (ref 6.0–8.3)

## 2021-07-22 LAB — CBC WITH DIFFERENTIAL/PLATELET
Basophils Absolute: 0.1 10*3/uL (ref 0.0–0.1)
Basophils Relative: 2.3 % (ref 0.0–3.0)
Eosinophils Absolute: 0.3 10*3/uL (ref 0.0–0.7)
Eosinophils Relative: 6.7 % — ABNORMAL HIGH (ref 0.0–5.0)
HCT: 41.2 % (ref 36.0–46.0)
Hemoglobin: 13.9 g/dL (ref 12.0–15.0)
Lymphocytes Relative: 27.6 % (ref 12.0–46.0)
Lymphs Abs: 1.4 10*3/uL (ref 0.7–4.0)
MCHC: 33.8 g/dL (ref 30.0–36.0)
MCV: 91.2 fl (ref 78.0–100.0)
Monocytes Absolute: 0.4 10*3/uL (ref 0.1–1.0)
Monocytes Relative: 7.1 % (ref 3.0–12.0)
Neutro Abs: 2.9 10*3/uL (ref 1.4–7.7)
Neutrophils Relative %: 56.3 % (ref 43.0–77.0)
Platelets: 215 10*3/uL (ref 150.0–400.0)
RBC: 4.51 Mil/uL (ref 3.87–5.11)
RDW: 13.2 % (ref 11.5–15.5)
WBC: 5.1 10*3/uL (ref 4.0–10.5)

## 2021-07-22 MED ORDER — ZOLPIDEM TARTRATE 5 MG PO TABS: 5.0000 mg | ORAL_TABLET | Freq: Every evening | ORAL | 1 refills | Status: DC | PRN

## 2021-07-22 MED ORDER — PANTOPRAZOLE SODIUM 40 MG PO TBEC: 40.0000 mg | DELAYED_RELEASE_TABLET | Freq: Two times a day (BID) | ORAL | 3 refills | Status: DC

## 2021-07-22 NOTE — Assessment & Plan Note (Signed)
Inc protonix to bid and refer to GI

## 2021-07-22 NOTE — Assessment & Plan Note (Signed)
Encourage heart healthy diet such as MIND or DASH diet, increase exercise, avoid trans fats, simple carbohydrates and processed foods, consider a krill or fish or flaxseed oil cap daily.  °

## 2021-07-22 NOTE — Patient Instructions (Signed)
Preventive Care 40 Years and Older, Female Preventive care refers to lifestyle choices and visits with your health care provider that can promote health and wellness. This includes: A yearly physical exam. This is also called an annual wellness visit. Regular dental and eye exams. Immunizations. Screening for certain conditions. Healthy lifestyle choices, such as: Eating a healthy diet. Getting regular exercise. Not using drugs or products that contain nicotine and tobacco. Limiting alcohol use. What can I expect for my preventive care visit? Physical exam Your health care provider will check your: Height and weight. These may be used to calculate your BMI (body mass index). BMI is a measurement that tells if you are at a healthy weight. Heart rate and blood pressure. Body temperature. Skin for abnormal spots. Counseling Your health care provider may ask you questions about your: Past medical problems. Family's medical history. Alcohol, tobacco, and drug use. Emotional well-being. Home life and relationship well-being. Sexual activity. Diet, exercise, and sleep habits. History of falls. Memory and ability to understand (cognition). Work and work Statistician. Pregnancy and menstrual history. Access to firearms. What immunizations do I need? Vaccines are usually given at various ages, according to a schedule. Your health care provider will recommend vaccines for you based on your age, medical history, and lifestyle or other factors, such as travel or where you work. What tests do I need? Blood tests Lipid and cholesterol levels. These may be checked every 5 years, or more often depending on your overall health. Hepatitis C test. Hepatitis B test. Screening Lung cancer screening. You may have this screening every year starting at age 33 if you have a 30-pack-year history of smoking and currently smoke or have quit within the past 15 years. Colorectal cancer screening. All  adults should have this screening starting at age 73 and continuing until age 9. Your health care provider may recommend screening at age 31 if you are at increased risk. You will have tests every 1-10 years, depending on your results and the type of screening test. Diabetes screening. This is done by checking your blood sugar (glucose) after you have not eaten for a while (fasting). You may have this done every 1-3 years. Mammogram. This may be done every 1-2 years. Talk with your health care provider about how often you should have regular mammograms. Abdominal aortic aneurysm (AAA) screening. You may need this if you are a current or former smoker. BRCA-related cancer screening. This may be done if you have a family history of breast, ovarian, tubal, or peritoneal cancers. Other tests STD (sexually transmitted disease) testing, if you are at risk. Bone density scan. This is done to screen for osteoporosis. You may have this done starting at age 43. Talk with your health care provider about your test results, treatment options, and if necessary, the need for more tests. Follow these instructions at home: Eating and drinking  Eat a diet that includes fresh fruits and vegetables, whole grains, lean protein, and low-fat dairy products. Limit your intake of foods with high amounts of sugar, saturated fats, and salt. Take vitamin and mineral supplements as recommended by your health care provider. Do not drink alcohol if your health care provider tells you not to drink. If you drink alcohol: Limit how much you have to 0-1 drink a day. Be aware of how much alcohol is in your drink. In the [place] one drink equals one 12 oz bottle of beer (355 mL), one 5 oz glass of wine (148 mL), or one  1 oz glass of hard liquor (44 mL). Lifestyle Take daily care of your teeth and gums. Brush your teeth every morning and night with fluoride toothpaste. Floss one time each day. Stay active. Exercise for at least  30 minutes 5 or more days each week. Do not use any products that contain nicotine or tobacco, such as cigarettes, e-cigarettes, and chewing tobacco. If you need help quitting, ask your health care provider. Do not use drugs. If you are sexually active, practice safe sex. Use a condom or other form of protection in order to prevent STIs (sexually transmitted infections). Talk with your health care provider about taking a low-dose aspirin or statin. Find healthy ways to cope with stress, such as: Meditation, yoga, or listening to music. Journaling. Talking to a trusted person. Spending time with friends and family. Safety Always wear your seat belt while driving or riding in a vehicle. Do not drive: If you have been drinking alcohol. Do not ride with someone who has been drinking. When you are tired or distracted. While texting. Wear a helmet and other protective equipment during sports activities. If you have firearms in your house, make sure you follow all gun safety procedures. What's next? Visit your health care provider once a year for an annual wellness visit. Ask your health care provider how often you should have your eyes and teeth checked. Stay up to date on all vaccines. This information is not intended to replace advice given to you by your health care provider. Make sure you discuss any questions you have with your health care provider. Document Revised: 01/15/2021 Document Reviewed: 11/01/2018 Elsevier Patient Education  2022 [employer].

## 2021-07-22 NOTE — Assessment & Plan Note (Signed)
Stomach has been acting up Refer to GI

## 2021-07-22 NOTE — Progress Notes (Signed)
Subjective:   By signing my name below, I, Shehryar Baig, attest that this documentation has been prepared under the direction and in the presence of Dr. Roma Schanz, DO. 07/22/2021    Patient ID: Samantha Miranda, female    DOB: 10-18-1946, 75 y.o.   MRN: [id number]  Chief Complaint  Patient presents with   Annual Exam    Pt states fasting     HPI Patient is in today for a comprehensive physical exam.  She complains of abdominal pain for the past couple of days. She also shortly after developed diarrhea. She thinks it may be her stomach ulcer flaring up again. She takes 40 mg pantoprazole daily PO to manage her symptoms and finds relief.  She reports not sleeping well due to nausea. She takes 5 mg Ambien 1x weekly PO to manage her symptoms and finds mild relief.  She also complains of chest congestion. She is requesting to get a screening of her lungs.  She reports having a mastectomy and gotten it biopsied and was told she needed to start chemotherapy. She prefers not to take it.  She denies having any fever, ear pain, congestion, sinus pain, sore throat, eye pain, chest pain, palpations, cough, SOB, wheezing, nausea, vomiting, constipation, blood in stool, dysuria, frequency, hematuria, or headaches at this time. She has 3 pfizer Covid-19 vaccines at this time. She is interested in getting a flu vaccine during this visit.  She is due for bone density exams and is not interested in getting it.    Past Medical History:  Diagnosis Date   Barrett's esophagus    Basal cell carcinoma of left lower leg    Breast cancer (HCC)    right   COPD (chronic obstructive pulmonary disease) ([place]) 2018   Depression 1989   Gastric ulcer    GERD (gastroesophageal reflux disease)    History of colon polyps    Hyperlipidemia    Osteopenia     Past Surgical History:  Procedure Laterality Date   ABDOMINAL HYSTERECTOMY  11/21/1989   APPENDECTOMY  1969   BREAST LUMPECTOMY  07/22/2006    BREAST LUMPECTOMY WITH RADIOACTIVE SEED AND SENTINEL LYMPH NODE BIOPSY Right 07/02/2021   Procedure: RIGHT BREAST LUMPECTOMY WITH RADIOACTIVE SEED AND RIGHT AXILLARY SENTINEL LYMPH NODE BIOPSY;  Surgeon: Rolm Bookbinder, MD;  Location: [facility];  Service: General;  Laterality: Right;   CATARACT EXTRACTION, BILATERAL     EYE SURGERY  08/2013  10/2013   cataracts   TONSILLECTOMY      Family History  Problem Relation Age of Onset   Heart failure Mother        CHF   Hyperlipidemia Mother    Aortic stenosis Mother    Arthritis Mother    Heart disease Mother    Lung cancer Father 35   Cancer Father    Early death Father    Breast cancer Sister 56   Cancer Sister    Depression Sister    Stroke Maternal Aunt    Varicose Veins Maternal Aunt    Diabetes Maternal Grandmother    Hyperlipidemia Maternal Grandmother    Colon cancer Maternal Grandfather    Cancer Maternal Grandfather        colon cancer   Cancer Paternal Grandfather        unknown type cancer   Drug abuse Daughter    Heart attack Neg Hx    Hypertension Neg Hx    Sudden death Neg Hx    Esophageal  cancer Neg Hx    Stomach cancer Neg Hx    Rectal cancer Neg Hx     Social History   Socioeconomic History   Marital status: Married    Spouse name: Not on file   Number of children: 1   Years of education: Not on file   Highest education level: Not on file  Occupational History   Occupation: retired    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 I smoked .5 pack a day. Before that, 1 pk. a day  Vaping Use   Vaping Use: Former   Substances: Nicotine  Substance and Sexual Activity   Alcohol use: No    Comment: occasional wine   Drug use: No   Sexual  activity: Not Currently    Partners: Male    Birth control/protection: Post-menopausal  Other Topics Concern   Not on file  Social History Narrative   Exercise-no   Social Determinants of Health   Financial Resource Strain: Low Risk    Difficulty of Paying Living Expenses: Not hard at all  Food Insecurity: No Food Insecurity   Worried About Charity fundraiser in the Last Year: Never true   Arboriculturist in the Last Year: Never true  Transportation Needs: No Transportation Needs   Lack of Transportation (Medical): No   Lack of Transportation (Non-Medical): No  Physical Activity: Sufficiently Active   Days of Exercise per Week: 6 days   Minutes of Exercise per Session: 30 min  Stress: No Stress Concern Present   Feeling of Stress : Not at all  Social Connections: Moderately Isolated   Frequency of Communication with Friends and Family: More than three times a week   Frequency of Social Gatherings with Friends and Family: More than three times a week   Attends Religious Services: Never   Marine scientist or Organizations: No   Attends Music therapist: Never   Marital Status: Married  Human resources officer Violence: Not At Risk   Fear of Current or Ex-Partner: No   Emotionally Abused: No   Physically Abused: No   Sexually Abused: No    Outpatient Medications Prior to Visit  Medication Sig Dispense Refill   Biotin w/ Vitamins C & E (HAIR SKIN & NAILS GUMMIES PO) Take 2 each by mouth daily.     Calcium-Magnesium-Zinc (CAL-MAG-ZINC PO) Take 2 tablets by mouth daily.     cholecalciferol (VITAMIN D3) 25 MCG (1000 UNIT) tablet Take 1,000 Units by mouth daily.     ezetimibe (ZETIA) 10 MG tablet TAKE 1 TABLET(10 MG) BY MOUTH DAILY (Patient taking differently: Take 10 mg by mouth daily. TAKE 1 TABLET(10 MG) BY MOUTH DAILY) 90 tablet 1   FLUoxetine (PROZAC) 10 MG tablet Take 1 tablet (10 mg total) by mouth daily. 90 tablet 1   fluticasone (FLONASE) 50 MCG/ACT nasal  spray Place 2 sprays into both nostrils daily. (Patient taking differently: Place 2 sprays into both nostrils daily as needed for allergies.) 16 g 5   hydrocortisone (ANUSOL-HC) 2.5 % rectal cream Place 1 application rectally 2 (two) times daily as needed for hemorrhoids  or anal itching. 30 g 1   Misc Natural Products (CHOLESTEROL SUPPORT PO) Take 2 capsules by mouth daily.     Omega-3 Fatty Acids (FISH OIL) 1200 MG CAPS Take 1,200 mg by mouth daily.     vitamin C (ASCORBIC ACID) 250 MG tablet Take 250 mg by mouth daily.     XIIDRA 5 % SOLN Place 1 drop into both eyes in the morning and at bedtime.  1   pantoprazole (PROTONIX) 40 MG tablet Take 1 tablet (40 mg total) by mouth daily before breakfast. 90 tablet 3   zolpidem (AMBIEN) 5 MG tablet Take 1 tablet (5 mg total) by mouth at bedtime as needed for sleep. 15 tablet 1   Pitavastatin Magnesium (ZYPITAMAG) 2 MG TABS Take 1 mg by mouth daily. (Patient not taking: No sig reported) 30 tablet 2   traMADol (ULTRAM) 50 MG tablet Take 2 tablets (100 mg total) by mouth every 6 (six) hours as needed. 10 tablet 0   No facility-administered medications prior to visit.    Allergies  Allergen Reactions   Amoxicillin-Pot Clavulanate Nausea And Vomiting   Moxifloxacin Nausea And Vomiting    Review of Systems  Constitutional:  Negative for fever.  HENT:  Negative for congestion, ear pain, sinus pain and sore throat.   Eyes:  Negative for pain.  Respiratory:  Negative for cough, shortness of breath and wheezing.        (+)Chest congestion  Cardiovascular:  Negative for chest pain and palpitations.  Gastrointestinal:  Positive for abdominal pain and diarrhea. Negative for blood in stool, constipation, nausea and vomiting.  Genitourinary:  Negative for dysuria, frequency and hematuria.  Neurological:  Negative for headaches.  Psychiatric/Behavioral:  Negative for depression. The patient is not nervous/anxious.       Objective:    Physical  Exam Constitutional:      General: She is not in acute distress.    Appearance: Normal appearance. She is not ill-appearing.  HENT:     Head: Normocephalic and atraumatic.     Right Ear: Tympanic membrane, ear canal and external ear normal.     Left Ear: Tympanic membrane, ear canal and external ear normal.  Eyes:     Extraocular Movements: Extraocular movements intact.     Pupils: Pupils are equal, round, and reactive to light.  Cardiovascular:     Rate and Rhythm: Normal rate and regular rhythm.     Heart sounds: Normal heart sounds. No murmur heard.   No gallop.  Pulmonary:     Effort: Pulmonary effort is normal. No respiratory distress.     Breath sounds: Normal breath sounds. No wheezing or rales.  Abdominal:     General: There is no distension.     Palpations: Abdomen is soft.     Tenderness: There is no abdominal tenderness. There is no guarding.  Musculoskeletal:     Comments: Non-tender cyst on bilateral elbow  Skin:    General: Skin is warm and dry.  Neurological:     Mental Status: She is alert and oriented to person, place, and time.  Psychiatric:        Behavior: Behavior normal.        Judgment: Judgment normal.    BP 118/70 (BP Location: Left Arm, Patient Position: Sitting, Cuff Size: Normal)   Pulse 90   Temp 98.3 F (36.8 C) (Oral)   Resp 18   Ht 5' 3.5" (1.613 m)   Wt 120 lb (54.4 kg)  SpO2 95%   BMI 20.92 kg/m  Wt Readings from Last 3 Encounters:  07/22/21 120 lb (54.4 kg)  07/21/21 122 lb 8 oz (55.6 kg)  07/02/21 121 lb 3.2 oz (55 kg)    Diabetic Foot Exam - Simple   No data filed    Lab Results  Component Value Date   WBC 5.1 07/22/2021   HGB 13.9 07/22/2021   HCT 41.2 07/22/2021   PLT 215.0 07/22/2021   GLUCOSE 88 07/22/2021   CHOL 220 (H) 07/22/2021   TRIG 95.0 07/22/2021   HDL 65.70 07/22/2021   LDLDIRECT 176.2 08/12/2013   LDLCALC 135 (H) 07/22/2021   ALT 12 07/22/2021   AST 19 07/22/2021   NA 141 07/22/2021   K 4.0  07/22/2021   CL 104 07/22/2021   CREATININE 0.99 07/22/2021   BUN 17 07/22/2021   CO2 29 07/22/2021   TSH 1.07 08/04/2017   HGBA1C 5.1 08/04/2017   MICROALBUR 1.1 08/12/2013    Lab Results  Component Value Date   TSH 1.07 08/04/2017   Lab Results  Component Value Date   WBC 5.1 07/22/2021   HGB 13.9 07/22/2021   HCT 41.2 07/22/2021   MCV 91.2 07/22/2021   PLT 215.0 07/22/2021   Lab Results  Component Value Date   NA 141 07/22/2021   K 4.0 07/22/2021   CO2 29 07/22/2021   GLUCOSE 88 07/22/2021   BUN 17 07/22/2021   CREATININE 0.99 07/22/2021   BILITOT 0.5 07/22/2021   ALKPHOS 51 07/22/2021   AST 19 07/22/2021   ALT 12 07/22/2021   PROT 6.9 07/22/2021   ALBUMIN 4.1 07/22/2021   CALCIUM 9.7 07/22/2021   ANIONGAP 8 06/23/2021   GFR 56.23 (L) 07/22/2021   Lab Results  Component Value Date   CHOL 220 (H) 07/22/2021   Lab Results  Component Value Date   HDL 65.70 07/22/2021   Lab Results  Component Value Date   LDLCALC 135 (H) 07/22/2021   Lab Results  Component Value Date   TRIG 95.0 07/22/2021   Lab Results  Component Value Date   CHOLHDL 3 07/22/2021   Lab Results  Component Value Date   HGBA1C 5.1 08/04/2017   Mammogram- Last completed 06/10/2021. Results showed 1 cm irregular asymmetry in upper inner right breast near lumpectomy site is indeterminate.  Colonoscopy- Last completed 11/25/2019. Results showed one diminutive polyp in the ascending colon which was removed, otherwise results are normal.      Assessment & Plan:   Problem List Items Addressed This Visit       Unprioritized   Gastroesophageal reflux disease    Inc protonix to bid and refer to GI      Relevant Medications   pantoprazole (PROTONIX) 40 MG tablet   Other Relevant Orders   Ambulatory referral to Gastroenterology   History of peptic ulcer    Stomach has been acting up Refer to GI      Relevant Orders   Ambulatory referral to Gastroenterology   Hyperlipidemia     Encourage heart healthy diet such as MIND or DASH diet, increase exercise, avoid trans fats, simple carbohydrates and processed foods, consider a krill or fish or flaxseed oil cap daily.       Insomnia   Relevant Medications   zolpidem (AMBIEN) 5 MG tablet   Preventative health care - Primary    ghm utd Check labs       Relevant Orders   Lipid panel (Completed)   CBC with  Differential/Platelet (Completed)   Comprehensive metabolic panel (Completed)   Other Visit Diagnoses     Need for influenza vaccination       Relevant Orders   Flu Vaccine QUAD High Dose(Fluad)        Meds ordered this encounter  Medications   DISCONTD: zolpidem (AMBIEN) 5 MG tablet    Sig: Take 1 tablet (5 mg total) by mouth at bedtime as needed for sleep.    Dispense:  30 tablet    Refill:  1   zolpidem (AMBIEN) 5 MG tablet    Sig: Take 1 tablet (5 mg total) by mouth at bedtime as needed for sleep.    Dispense:  30 tablet    Refill:  1   pantoprazole (PROTONIX) 40 MG tablet    Sig: Take 1 tablet (40 mg total) by mouth 2 (two) times daily.    Dispense:  180 tablet    Refill:  3    I, Dr. Roma Schanz, DO, personally preformed the services described in this documentation.  All medical record entries made by the scribe were at my direction and in my presence.  I have reviewed the chart and discharge instructions (if applicable) and agree that the record reflects my personal performance and is accurate and complete. 06/21/2021   I,Shehryar Baig,acting as a scribe for Ann Held, DO.,have documented all relevant documentation on the behalf of Ann Held, DO,as directed by  Ann Held, DO while in the presence of Ann Held, DO.   Ann Held, DO

## 2021-07-22 NOTE — Assessment & Plan Note (Signed)
ghm utd Check labs  

## 2021-07-25 ENCOUNTER — Other Ambulatory Visit: Payer: Self-pay | Admitting: Family Medicine

## 2021-07-25 DIAGNOSIS — E785 Hyperlipidemia, unspecified: Secondary | ICD-10-CM

## 2021-07-25 DIAGNOSIS — F324 Major depressive disorder, single episode, in partial remission: Secondary | ICD-10-CM

## 2021-07-27 ENCOUNTER — Encounter: Payer: Self-pay | Admitting: Family Medicine

## 2021-07-29 ENCOUNTER — Encounter: Payer: Self-pay | Admitting: Hematology

## 2021-07-30 ENCOUNTER — Encounter: Payer: Self-pay | Admitting: *Deleted

## 2021-08-02 ENCOUNTER — Encounter: Payer: Self-pay | Admitting: *Deleted

## 2021-08-02 ENCOUNTER — Telehealth: Payer: Self-pay | Admitting: *Deleted

## 2021-08-02 NOTE — Telephone Encounter (Signed)
Left vm regarding decision to pursue chemo. Contact information provided for return call.

## 2021-08-05 ENCOUNTER — Encounter: Payer: Self-pay | Admitting: *Deleted

## 2021-08-06 ENCOUNTER — Ambulatory Visit (INDEPENDENT_AMBULATORY_CARE_PROVIDER_SITE_OTHER)
Admission: RE | Admit: 2021-08-06 | Discharge: 2021-08-06 | Disposition: A | Discharge status: Home / Self Care | Payer: Medicare PPO | Source: Ambulatory Visit | Attending: Acute Care | Admitting: Acute Care

## 2021-08-06 ENCOUNTER — Other Ambulatory Visit: Payer: Self-pay

## 2021-08-06 DIAGNOSIS — Z87891 Personal history of nicotine dependence: Secondary | ICD-10-CM | POA: Diagnosis not present

## 2021-08-06 DIAGNOSIS — J439 Emphysema, unspecified: Secondary | ICD-10-CM | POA: Diagnosis not present

## 2021-08-06 DIAGNOSIS — I700 Atherosclerosis of aorta: Secondary | ICD-10-CM | POA: Diagnosis not present

## 2021-08-09 DIAGNOSIS — Z803 Family history of malignant neoplasm of breast: Secondary | ICD-10-CM | POA: Diagnosis not present

## 2021-08-09 DIAGNOSIS — C50919 Malignant neoplasm of unspecified site of unspecified female breast: Secondary | ICD-10-CM | POA: Diagnosis not present

## 2021-08-09 DIAGNOSIS — E785 Hyperlipidemia, unspecified: Secondary | ICD-10-CM | POA: Diagnosis not present

## 2021-08-09 DIAGNOSIS — K219 Gastro-esophageal reflux disease without esophagitis: Secondary | ICD-10-CM | POA: Diagnosis not present

## 2021-08-09 DIAGNOSIS — R030 Elevated blood-pressure reading, without diagnosis of hypertension: Secondary | ICD-10-CM | POA: Diagnosis not present

## 2021-08-09 DIAGNOSIS — Z880 Allergy status to penicillin: Secondary | ICD-10-CM | POA: Diagnosis not present

## 2021-08-09 DIAGNOSIS — Z87891 Personal history of nicotine dependence: Secondary | ICD-10-CM | POA: Diagnosis not present

## 2021-08-09 DIAGNOSIS — F329 Major depressive disorder, single episode, unspecified: Secondary | ICD-10-CM | POA: Diagnosis not present

## 2021-08-10 ENCOUNTER — Telehealth: Payer: Self-pay | Admitting: *Deleted

## 2021-08-10 ENCOUNTER — Encounter: Payer: Self-pay | Admitting: *Deleted

## 2021-08-10 NOTE — Telephone Encounter (Signed)
Spoke to pt regarding decision on chemo. Pt has decided to forgo chemo but has agreed to taking oral anti-estrogen oral therapy. Informed pt that Dr. Burr Medico will further this options of anti-estrogens. Received verbal understanding.  Physician team notified.  Pharmacy verified

## 2021-08-12 ENCOUNTER — Telehealth: Payer: Self-pay | Admitting: Hematology

## 2021-08-12 ENCOUNTER — Other Ambulatory Visit: Payer: Self-pay

## 2021-08-12 ENCOUNTER — Emergency Department (HOSPITAL_COMMUNITY): Payer: Medicare PPO

## 2021-08-12 ENCOUNTER — Emergency Department (HOSPITAL_COMMUNITY)
Admission: EM | Admit: 2021-08-12 | Discharge: 2021-08-12 | Disposition: A | Discharge status: Home / Self Care | Payer: Medicare PPO | Attending: Emergency Medicine | Admitting: Emergency Medicine

## 2021-08-12 ENCOUNTER — Telehealth: Payer: Self-pay | Admitting: Family Medicine

## 2021-08-12 ENCOUNTER — Encounter (HOSPITAL_COMMUNITY): Payer: Self-pay

## 2021-08-12 DIAGNOSIS — E876 Hypokalemia: Secondary | ICD-10-CM | POA: Diagnosis not present

## 2021-08-12 DIAGNOSIS — Z20822 Contact with and (suspected) exposure to covid-19: Secondary | ICD-10-CM | POA: Diagnosis not present

## 2021-08-12 DIAGNOSIS — R519 Headache, unspecified: Secondary | ICD-10-CM | POA: Diagnosis not present

## 2021-08-12 DIAGNOSIS — R1084 Generalized abdominal pain: Secondary | ICD-10-CM | POA: Insufficient documentation

## 2021-08-12 DIAGNOSIS — R42 Dizziness and giddiness: Secondary | ICD-10-CM | POA: Diagnosis not present

## 2021-08-12 DIAGNOSIS — Z853 Personal history of malignant neoplasm of breast: Secondary | ICD-10-CM | POA: Insufficient documentation

## 2021-08-12 DIAGNOSIS — R112 Nausea with vomiting, unspecified: Secondary | ICD-10-CM | POA: Diagnosis not present

## 2021-08-12 DIAGNOSIS — R6883 Chills (without fever): Secondary | ICD-10-CM | POA: Insufficient documentation

## 2021-08-12 DIAGNOSIS — M791 Myalgia, unspecified site: Secondary | ICD-10-CM | POA: Diagnosis not present

## 2021-08-12 DIAGNOSIS — R109 Unspecified abdominal pain: Secondary | ICD-10-CM | POA: Diagnosis not present

## 2021-08-12 DIAGNOSIS — K219 Gastro-esophageal reflux disease without esophagitis: Secondary | ICD-10-CM | POA: Insufficient documentation

## 2021-08-12 DIAGNOSIS — Z9011 Acquired absence of right breast and nipple: Secondary | ICD-10-CM | POA: Insufficient documentation

## 2021-08-12 DIAGNOSIS — E860 Dehydration: Secondary | ICD-10-CM | POA: Diagnosis not present

## 2021-08-12 DIAGNOSIS — J449 Chronic obstructive pulmonary disease, unspecified: Secondary | ICD-10-CM | POA: Insufficient documentation

## 2021-08-12 DIAGNOSIS — Z7952 Long term (current) use of systemic steroids: Secondary | ICD-10-CM | POA: Diagnosis not present

## 2021-08-12 DIAGNOSIS — Z87891 Personal history of nicotine dependence: Secondary | ICD-10-CM | POA: Insufficient documentation

## 2021-08-12 DIAGNOSIS — R1110 Vomiting, unspecified: Secondary | ICD-10-CM | POA: Diagnosis not present

## 2021-08-12 LAB — URINALYSIS, ROUTINE W REFLEX MICROSCOPIC
Bilirubin Urine: NEGATIVE
Glucose, UA: NEGATIVE mg/dL
Hgb urine dipstick: NEGATIVE
Ketones, ur: 15 mg/dL — AB
Leukocytes,Ua: NEGATIVE
Nitrite: NEGATIVE
Protein, ur: 30 mg/dL — AB
Specific Gravity, Urine: 1.015 (ref 1.005–1.030)
pH: 7.5 (ref 5.0–8.0)

## 2021-08-12 LAB — COMPREHENSIVE METABOLIC PANEL
ALT: 19 U/L (ref 0–44)
AST: 32 U/L (ref 15–41)
Albumin: 4.9 g/dL (ref 3.5–5.0)
Alkaline Phosphatase: 54 U/L (ref 38–126)
Anion gap: 14 (ref 5–15)
BUN: 21 mg/dL (ref 8–23)
CO2: 24 mmol/L (ref 22–32)
Calcium: 9.4 mg/dL (ref 8.9–10.3)
Chloride: 101 mmol/L (ref 98–111)
Creatinine, Ser: 0.76 mg/dL (ref 0.44–1.00)
GFR, Estimated: >60 mL/min (ref >60)
Glucose, Bld: 120 mg/dL — ABNORMAL HIGH (ref 70–99)
Potassium: 3.2 mmol/L — ABNORMAL LOW (ref 3.5–5.1)
Sodium: 139 mmol/L (ref 135–145)
Total Bilirubin: 0.9 mg/dL (ref 0.3–1.2)
Total Protein: 8.6 g/dL — ABNORMAL HIGH (ref 6.5–8.1)

## 2021-08-12 LAB — URINALYSIS, MICROSCOPIC (REFLEX)
Bacteria, UA: NONE SEEN
Squamous Epithelial / HPF: NONE SEEN (ref 0–5)

## 2021-08-12 LAB — CBC
HCT: 44.4 % (ref 36.0–46.0)
Hemoglobin: 14.7 g/dL (ref 12.0–15.0)
MCH: 30.4 pg (ref 26.0–34.0)
MCHC: 33.1 g/dL (ref 30.0–36.0)
MCV: 91.9 fL (ref 80.0–100.0)
Platelets: 220 10*3/uL (ref 150–400)
RBC: 4.83 MIL/uL (ref 3.87–5.11)
RDW: 12.9 % (ref 11.5–15.5)
WBC: 9 10*3/uL (ref 4.0–10.5)
nRBC: 0 % (ref 0.0–0.2)

## 2021-08-12 LAB — RESP PANEL BY RT-PCR (FLU A&B, COVID) ARPGX2
Influenza A by PCR: NEGATIVE
Influenza B by PCR: NEGATIVE
SARS Coronavirus 2 by RT PCR: NEGATIVE

## 2021-08-12 LAB — LIPASE, BLOOD: Lipase: 48 U/L (ref 11–51)

## 2021-08-12 MED ORDER — POTASSIUM CHLORIDE CRYS ER 20 MEQ PO TBCR
40.0000 meq | EXTENDED_RELEASE_TABLET | Freq: Once | ORAL | Status: AC
  Administered 2021-08-12: 40 meq via ORAL
  Filled 2021-08-12: qty 2

## 2021-08-12 MED ORDER — ONDANSETRON HCL 4 MG/2ML IJ SOLN
4.0000 mg | Freq: Once | INTRAMUSCULAR | Status: AC
Start: 2021-08-12 — End: 2021-08-12
  Administered 2021-08-12: 4 mg via INTRAVENOUS
  Filled 2021-08-12: qty 2

## 2021-08-12 MED ORDER — SODIUM CHLORIDE 0.9 % IV BOLUS
1000.0000 mL | Freq: Once | INTRAVENOUS | Status: AC
  Administered 2021-08-12: 1000 mL via INTRAVENOUS

## 2021-08-12 MED ORDER — ONDANSETRON 4 MG PO TBDP: 4.0000 mg | ORAL_TABLET | Freq: Three times a day (TID) | ORAL | 0 refills | Status: DC | PRN

## 2021-08-12 MED ORDER — IOHEXOL 350 MG/ML SOLN
80.0000 mL | Freq: Once | INTRAVENOUS | Status: AC | PRN
  Administered 2021-08-12: 80 mL via INTRAVENOUS

## 2021-08-12 MED ORDER — POTASSIUM CHLORIDE ER 10 MEQ PO TBCR: 10.0000 meq | EXTENDED_RELEASE_TABLET | Freq: Every day | ORAL | 0 refills | Status: DC

## 2021-08-12 NOTE — Telephone Encounter (Signed)
Spoke with triage nurse and she asked if we have any emergent appointments.  We did not have any openings.  She stated pt has been vomiting uncontrollably.  Nurse then asked could we just send something in.  I advised that patient needs to go to ER now, she may need fluids.  We do not have fluids in the office.  She will advise patient to go to the ER.

## 2021-08-12 NOTE — Telephone Encounter (Signed)
Pt. Called in and stated she started vommiting uncontrollably and has been for an hr. She cant keep anything down, her head is hurting and come on very suddenly, shes having spells, chills, and body aches. Transferred to triage for spells and very sudden headache.

## 2021-08-12 NOTE — ED Triage Notes (Signed)
Pt reports sudden onset of N/V and abdominal and generalized abdominal pain that began around 130p today. Pt reports being unable to keep anything down.

## 2021-08-12 NOTE — Telephone Encounter (Signed)
Scheduled appt per 9/21 sch msg. Pt is aware of appt date and time and is aware it will be a phone visit.

## 2021-08-12 NOTE — ED Provider Notes (Signed)
[facility] DEPT Provider Note   CSN: [id number] Arrival date & time: 08/12/21  1717     History Chief Complaint  Patient presents with   Emesis   Abdominal Pain    Samantha Miranda is a 75 y.o. female with PMHx HLD, GERD, COPD who presents to the ED today with complaint of gradual onset, constant, nausea and NBNB emesis that began around 2 PM today.  Patient also complains of some diffuse mild abdominal pain.  She states she has a headache and feels dehydrated and dizzy. She does mention that her headache started after several bouts of vomiting and denies headache proceeding nausea/vomiting.  It does appear she called her PCP today to see if they could see her due to her symptoms however they advise she go to the ED.  She had endorsed chills and body aches on the triage report.  She states that she always coughs however does not feel it is worse than normal.  She denies any recent sick contacts.  Patient has had recent diagnosis of breast cancer however has declined chemotherapy.  She has not started medications for same. She last had a BM earlier today. She is unsure what is causing her symptoms.   The history is provided by the patient and medical records.      Past Medical History:  Diagnosis Date   Barrett's esophagus    Basal cell carcinoma of left lower leg    Breast cancer ([place])    right   COPD (chronic obstructive pulmonary disease) ([place]) 2018   Depression 1989   Gastric ulcer    GERD (gastroesophageal reflux disease)    History of colon polyps    Hyperlipidemia    Osteopenia     Patient Active Problem List   Diagnosis Date Noted   Preventative health care 07/22/2021   Insomnia 07/22/2021   History of peptic ulcer 07/22/2021   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 ([place]) 06/23/2021   Depression, major, single episode, 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   Influenza vaccine administered 11/02/2015   Otitis media 08/10/2015    Acute bronchitis 04/21/2015   Right ankle pain 02/10/2014   Left shoulder pain 11/22/2013   Right shoulder pain 08/20/2013   Near syncope 08/12/2013   Other malaise and fatigue 08/12/2013   Rectal itching 07/01/2013   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   Gastroesophageal reflux disease 08/12/2012   Former heavy tobacco smoker 08/26/2010   COLONIC POLYPS, HX OF 08/26/2010   POSTMENOPAUSAL STATUS 08/26/2010   POSTMENOPAUSAL BLEEDING 04/13/2009   Hyperlipidemia 07/11/2007   DEPRESSION 07/11/2007   OSTEOPENIA 07/11/2007   BREAST CANCER, HX OF 07/11/2007    Past Surgical History:  Procedure Laterality Date   ABDOMINAL HYSTERECTOMY  11/21/1989   APPENDECTOMY  1969   BREAST LUMPECTOMY  07/22/2006   BREAST LUMPECTOMY WITH RADIOACTIVE SEED AND SENTINEL LYMPH NODE BIOPSY Right 07/02/2021   Procedure: RIGHT BREAST LUMPECTOMY WITH RADIOACTIVE SEED AND RIGHT AXILLARY SENTINEL LYMPH NODE BIOPSY;  Surgeon: Rolm Bookbinder, MD;  Location: [facility];  Service: General;  Laterality: Right;   CATARACT EXTRACTION, BILATERAL     EYE SURGERY  08/2013  10/2013   cataracts   TONSILLECTOMY       OB History   No obstetric history on file.     Family History  Problem Relation Age of Onset   Heart failure Mother        CHF   Hyperlipidemia Mother    Aortic stenosis Mother  Arthritis Mother    Heart disease Mother    Lung cancer Father 2   Cancer Father    Early death Father    Breast cancer Sister 38   Cancer Sister    Depression Sister    Stroke Maternal Aunt    Varicose Veins Maternal Aunt    Diabetes Maternal Grandmother    Hyperlipidemia Maternal Grandmother    Colon cancer Maternal Grandfather    Cancer Maternal Grandfather        colon cancer   Cancer Paternal Grandfather        unknown type cancer   Drug abuse Daughter    Heart attack Neg Hx    Hypertension Neg Hx    Sudden death Neg Hx    Esophageal cancer Neg Hx    Stomach cancer Neg Hx    Rectal cancer Neg Hx     Social  History   Tobacco Use   Smoking status: Former    Packs/day: 0.50    Years: 40.00    Pack years: 20.00    Types: Cigarettes, E-cigarettes    Quit date: 07/23/2011    Years since quitting: 10.0   Smokeless tobacco: Never   Tobacco comments:    The last 5 yrs. I smoked .5 pack a day. Before that, 1 pk. a day  Vaping Use   Vaping Use: Former   Substances: Nicotine  Substance Use Topics   Alcohol use: No    Comment: occasional wine   Drug use: No    Home Medications Prior to Admission medications   Medication Sig Start Date End Date Taking? Authorizing Provider  ondansetron (ZOFRAN ODT) 4 MG disintegrating tablet Take 1 tablet (4 mg total) by mouth every 8 (eight) hours as needed for nausea or vomiting. 08/12/21  Yes Alroy Bailiff, Ariauna Farabee, PA-C  potassium chloride (KLOR-CON) 10 MEQ tablet Take 1 tablet (10 mEq total) by mouth daily for 5 days. 08/12/21 08/17/21 Yes Cecil Vandyke, PA-C  Biotin w/ Vitamins C & E (HAIR SKIN & NAILS GUMMIES PO) Take 2 each by mouth daily.    [provider]  Calcium-Magnesium-Zinc (CAL-MAG-ZINC PO) Take 2 tablets by mouth daily.    [provider]  cholecalciferol (VITAMIN D3) 25 MCG (1000 UNIT) tablet Take 1,000 Units by mouth daily.    [provider]  ezetimibe (ZETIA) 10 MG tablet TAKE ONE TABLET BY MOUTH ONE TIME DAILY 07/27/21   Carollee Herter, Alferd Apa, DO  FLUoxetine (PROZAC) 10 MG tablet TAKE ONE TABLET BY MOUTH ONE TIME DAILY 07/27/21   Carollee Herter, Alferd Apa, DO  fluticasone (FLONASE) 50 MCG/ACT nasal spray Place 2 sprays into both nostrils daily. Patient taking differently: Place 2 sprays into both nostrils daily as needed for allergies. 04/29/19   Ann Held, DO  hydrocortisone (ANUSOL-HC) 2.5 % rectal cream Place 1 application rectally 2 (two) times daily as needed for hemorrhoids or anal itching. 04/03/20   Ann Held, DO  Misc Natural Products (CHOLESTEROL SUPPORT PO) Take 2 capsules by mouth daily.    [provider]  Omega-3 Fatty Acids (FISH OIL) 1200 MG CAPS Take 1,200 mg by mouth daily.    [provider]  pantoprazole (PROTONIX) 40 MG tablet Take 1 tablet (40 mg total) by mouth 2 (two) times daily. 07/22/21   Ann Held, DO  vitamin C (ASCORBIC ACID) 250 MG tablet Take 250 mg by mouth daily.    [provider]  XIIDRA 5 %  SOLN Place 1 drop into both eyes in the morning and at bedtime. 05/08/17   [provider]  zolpidem (AMBIEN) 5 MG tablet Take 1 tablet (5 mg total) by mouth at bedtime as needed for sleep. 07/22/21   Ann Held, DO    Allergies    Amoxicillin-pot clavulanate and Moxifloxacin  Review of Systems   Review of Systems  Constitutional:  Negative for chills and fever.  Eyes:  Negative for visual disturbance.  Respiratory:  Positive for cough (unchanged per patient).   Cardiovascular:  Negative for chest pain.  Gastrointestinal:  Positive for abdominal pain, nausea and vomiting. Negative for constipation and diarrhea.  Musculoskeletal:  Positive for myalgias.  Neurological:  Positive for headaches.  All other systems reviewed and are negative.  Physical Exam Updated Vital Signs BP (!) 181/97 (BP Location: Left Arm)   Pulse 92   Temp 98.7 F (37.1 C) (Oral)   Resp 18   SpO2 100%   Physical Exam Vitals and nursing note reviewed.  Constitutional:      Appearance: She is not ill-appearing or diaphoretic.     Comments: Actively dry heaving  HENT:     Head: Normocephalic and atraumatic.  Eyes:     Conjunctiva/sclera: Conjunctivae normal.  Cardiovascular:     Rate and Rhythm: Normal rate and regular rhythm.     Heart sounds: Normal heart sounds.  Pulmonary:     Effort: Pulmonary effort is normal.     Breath sounds: Normal breath sounds. No wheezing, rhonchi or rales.  Abdominal:     General: Bowel sounds are normal.     Palpations: Abdomen is soft.     Tenderness: There is abdominal tenderness.     Comments:  Soft, diffuse mild abdominal TTP, +BS throughout, no r/g/r, neg murphy's, neg mcburney's, no CVA TTP  Musculoskeletal:     Cervical back: Neck supple.  Skin:    General: Skin is warm and dry.  Neurological:     Mental Status: She is alert.    ED Results / Procedures / Treatments   Labs (all labs ordered are listed, but only abnormal results are displayed) Labs Reviewed  COMPREHENSIVE METABOLIC PANEL - Abnormal; Notable for the following components:      Result Value   Potassium 3.2 (*)    Glucose, Bld 120 (*)    Total Protein 8.6 (*)    All other components within normal limits  URINALYSIS, ROUTINE W REFLEX MICROSCOPIC - Abnormal; Notable for the following components:   APPearance HAZY (*)    Ketones, ur 15 (*)    Protein, ur 30 (*)    All other components within normal limits  RESP PANEL BY RT-PCR (FLU A&B, COVID) ARPGX2  LIPASE, BLOOD  CBC  URINALYSIS, MICROSCOPIC (REFLEX)    EKG EKG Interpretation  Date/Time:  Thursday August 12 2021 18:31:59 EDT Ventricular Rate:  73 PR Interval:  190 QRS Duration: 136 QT Interval:  449 QTC Calculation: 495 R Axis:   48 Text Interpretation: Sinus rhythm Right bundle branch block No significant change since last tracing Confirmed by Blanchie Dessert [id number] on 08/12/2021 9:19:03 PM  Radiology CT Abdomen Pelvis W Contrast  Result Date: 08/12/2021 CLINICAL DATA:  Nausea vomiting and abdominal pain. EXAM: CT ABDOMEN AND PELVIS WITH CONTRAST TECHNIQUE: Multidetector CT imaging of the abdomen and pelvis was performed using the standard protocol following bolus administration of intravenous contrast. CONTRAST:  59mL OMNIPAQUE IOHEXOL 350 MG/ML SOLN COMPARISON:  None. FINDINGS: Lower chest: The  visualized lung bases are clear. No intra-abdominal free air or free fluid. Hepatobiliary: No focal liver abnormality is seen. No gallstones, gallbladder wall thickening, or biliary dilatation. Pancreas: Unremarkable. No pancreatic ductal dilatation  or surrounding inflammatory changes. Spleen: Normal in size without focal abnormality. Adrenals/Urinary Tract: The adrenal glands are unremarkable. There is no hydronephrosis on either side. There is symmetric enhancement and excretion of contrast by both kidneys. The visualized ureters and the urinary bladder is appear unremarkable. Stomach/Bowel: There is sigmoid diverticulosis without active inflammatory changes. There is no bowel obstruction or active inflammation. Appendectomy. Vascular/Lymphatic: Moderate aortoiliac atherosclerotic disease. The IVC is unremarkable. No portal venous gas. There is no adenopathy. Reproductive: Hysterectomy.  No adnexal masses. Other: Small fat containing umbilical hernia. Musculoskeletal: Osteopenia with degenerative changes of the spine. No acute osseous pathology. IMPRESSION: 1. No acute intra-abdominal or pelvic pathology. 2. Sigmoid diverticulosis. 3. Aortic Atherosclerosis (ICD10-I70.0). Electronically Signed   By: Anner Crete M.D.   On: 08/12/2021 21:15    Procedures Procedures   Medications Ordered in ED Medications  potassium chloride SA (KLOR-CON) CR tablet 40 mEq (has no administration in time range)  sodium chloride 0.9 % bolus 1,000 mL (0 mLs Intravenous Stopped 08/12/21 2058)  ondansetron (ZOFRAN) injection 4 mg (4 mg Intravenous Given 08/12/21 1816)  iohexol (OMNIPAQUE) 350 MG/ML injection 80 mL (80 mLs Intravenous Contrast Given 08/12/21 2100)    ED Course  I have reviewed the triage vital signs and the nursing notes.  Pertinent labs & imaging results that were available during my care of the patient were reviewed by me and considered in my medical decision making (see chart for details).    MDM Rules/Calculators/A&P                           75 year old female presenting to the ED today with complaint of abdominal pain, nausea, vomiting that began earlier today as well as headache however other symptoms proceeded headache. Denies sudden  onset headache/dizziness prior to nausea/vomiting to suggest intracranial abnormality. Suspect headache related to dehydration/excessive vomiting.On arrival to the ED pt is afebrile, nontachycardic, and nontachypneic. She is in NAD however actively dry heaving in the room. On exam she has some mild diffuse abdominal TTP. Will plan for labs, CT scan, fluids, and antiemetics and reevaluate. COVID test ordered as pt called PCP today stating she was having body aches as well.   CBC without leukocytosis. Hgb stable at 14.7 CMP with potassium 3.2; will replete. Glucose 120. No other electrolyte abnormalities. Lfts unremarkable.  Lipase 48 U/A with ketones and protein, likely dehydration. No signs of infection.  COVID and flu negative  CT scan: IMPRESSION:  1. No acute intra-abdominal or pelvic pathology.  2. Sigmoid diverticulosis.  3. Aortic Atherosclerosis (ICD10-I70.0).    Workup overall reassuring today. On reevaluation pt resting comfortably. Reports her symptoms have all resolved after fluids and zofran. Question viral gastroenteritis at this time. Will have pt follow up with PCP for further eval. Will discharge with potassium supplements and zofran PRN. Pt in agreement with plan. Strict return precautions discussed. Stable for discharge.   This note was prepared using Dragon voice recognition software and may include unintentional dictation errors due to the inherent limitations of voice recognition software.   Final Clinical Impression(s) / ED Diagnoses Final diagnoses:  Non-intractable vomiting with nausea, unspecified vomiting type  Generalized abdominal pain  Hypokalemia    Rx / DC Orders ED Discharge Orders  Ordered    ondansetron (ZOFRAN ODT) 4 MG disintegrating tablet  Every 8 hours PRN        08/12/21 2143    potassium chloride (KLOR-CON) 10 MEQ tablet  Daily        08/12/21 2143             Discharge Instructions      Your workup was overall reassuring  at this time. Your symptoms may be related to a viral GI illness.   Please follow up with your PCP regarding ED visit today. I have prescribed a short course of potassium supplements as your potassium level was slightly decreased today. Please have your potassium level rechecked in 1-2 weeks.   Take the nausea medication as needed. Drink plenty of fluids to stay hydrated.   Return to the ED IMMEDIATELY for any new/worsening symptoms       Eustaquio Maize, Hershal Coria 08/12/21 2144    Blanchie Dessert, MD 08/13/21 [phone]

## 2021-08-12 NOTE — ED Notes (Signed)
Patient transported to CT 

## 2021-08-12 NOTE — Discharge Instructions (Addendum)
Your workup was overall reassuring at this time. Your symptoms may be related to a viral GI illness.   Please follow up with your PCP regarding ED visit today. I have prescribed a short course of potassium supplements as your potassium level was slightly decreased today. Please have your potassium level rechecked in 1-2 weeks.   Take the nausea medication as needed. Drink plenty of fluids to stay hydrated.   Return to the ED IMMEDIATELY for any new/worsening symptoms

## 2021-08-13 NOTE — Telephone Encounter (Signed)
Patient went to ED

## 2021-08-18 ENCOUNTER — Inpatient Hospital Stay: Payer: Medicare PPO | Attending: Hematology | Admitting: Hematology

## 2021-08-18 ENCOUNTER — Encounter: Payer: Self-pay | Admitting: *Deleted

## 2021-08-18 ENCOUNTER — Encounter: Payer: Self-pay | Admitting: Hematology

## 2021-08-18 DIAGNOSIS — E2839 Other primary ovarian failure: Secondary | ICD-10-CM | POA: Diagnosis not present

## 2021-08-18 DIAGNOSIS — C50111 Malignant neoplasm of central portion of right female breast: Secondary | ICD-10-CM | POA: Diagnosis not present

## 2021-08-18 DIAGNOSIS — Z171 Estrogen receptor negative status [ER-]: Secondary | ICD-10-CM

## 2021-08-18 MED ORDER — EXEMESTANE 25 MG PO TABS: 25.0000 mg | ORAL_TABLET | Freq: Every day | ORAL | 3 refills | Status: DC

## 2021-08-18 NOTE — Progress Notes (Signed)
[place]   Telephone:([phone]) [phone] Fax:(336) [phone]   Clinic Follow up Note   Patient Care Team: Carollee Herter, Alferd Apa, DO as PCP - General Shirley Muscat Loreen Freud, MD as Referring Physician (Optometry) Izora Ribas (Dermatology) Mauro Kaufmann, RN as Oncology Nurse Navigator Rockwell Germany, RN as Oncology Nurse Navigator Rolm Bookbinder, MD as Consulting Physician (General Surgery) Truitt Merle, MD as Consulting Physician (Hematology) Kyung Rudd, MD as Consulting Physician (Radiation Oncology)  Date of Service:  08/18/2021  I connected with [name] on 08/18/2021 at 12:20 PM EDT by telephone visit and verified that I am speaking with the correct person using two identifiers.  I discussed the limitations, risks, security and privacy concerns of performing an evaluation and management service by telephone and the availability of in person appointments. I also discussed with the patient that there may be a patient responsible charge related to this service. The patient expressed understanding and agreed to proceed.   Other persons participating in the visit and their role in the encounter:  none  Patient's location:  home Provider's location:  my office  CHIEF COMPLAINT: f/u of right breast cancer  CURRENT THERAPY:  To start exemestane  ASSESSMENT & PLAN:  Samantha Miranda is a 75 y.o. female with   1. 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 Biopsy on 06/17/21 revealed grade 1 IDC with DCIS, ER+, PR+, Her2- -lumpectomy on 07/02/21 by Dr. Donne Hazel showed invasive and in situ carcinoma, 1.2 cm, grade 1. Margins and all five lymph nodes were negative. -given her previous right breast radiation, additional radiation is not recommended. -RS of 28, risk of 17%, chemo recommended. She declined, which is understandable. She agrees to proceed with  antiestrogen therapy. -Given the strong ER and PR positivity, I do recommend adjuvant antiestrogen therapy to reduce her risk of cancer recurrence,  The potential benefit and side effects, which includes but not limited to, hot flash, skin and vaginal dryness, metabolic changes ( increased blood glucose, cholesterol, weight, etc.), slightly in increased risk of cardiovascular disease, cataracts, muscular and joint discomfort, osteopenia and osteoporosis, etc, were discussed with her in great details. Given her post-hysterectomy status and osteoporosis, I recommend tamoxifen. -We discussed that since she is on Prozac, she will need to either wean off the Prozac or choose a different antiestrogen. She would like to go with exemestane. I prescribed for her today. Fisk of osteopenia and osteoporosis were discussed with her, she previously declined oral biphosphatase     2. H/o Right Breast DCIS, ER+/PR+ -diagnosed in 2007. Underwent surgery and radiation therapy. She did not receive antiestrogen therapy.   3. Bone Health  -She has had DEXA before, but she does not remember the date and we do not have it on file. I recommended we obtain a repeat, and she is agreeable.   4. Genetic Testing -due to her strong family history of cancer, she is eligible for genetic testing.  -I recommended this given she has a daughter. She declines, as she does not want to know.   5. Social Support -her sister had breast cancer, was treated with chemo and radiation, and ultimately still died from it. -she is estranged from her daughter due to her daughter's dug use/addiction.  -she has support at home from her husband.     PLAN:  -I prescribed exemestane toady and she will start  -  I ordered DEXA to be done soon -survivorship with NP Lacie in 3 months -lab and f/u in 6 months   No problem-specific Assessment & Plan notes found for this encounter.   SUMMARY OF ONCOLOGIC HISTORY: Oncology History Overview Note   Cancer Staging 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 [facility]) Staging form: Breast, AJCC 8th Edition - Clinical stage from 06/21/2021: Stage IA (cT1b, cN0, cM0, G1, ER+, PR+, HER2-) - Signed by Truitt Merle, MD on 06/23/2021 Stage prefix: Initial diagnosis Histologic grading system: 3 grade system - Pathologic stage from 07/02/2021: Stage IA (pT1c, pN0, cM0, G1, ER+, PR+, HER2-, Oncotype DX score: 28) - Signed by Truitt Merle, MD on 07/21/2021 Stage prefix: Initial diagnosis Multigene prognostic tests performed: Oncotype DX Recurrence score range: Greater than or equal to 11 Histologic grading system: 3 grade system     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 ([place])  06/10/2021 Mammogram   Right Diagnostic Mammogram; Right Breast Ultrasound  IMPRESSION:  The 1 cm x 1 cm x 1 cm irregular mass in the right breast is suspicious of malignancy.   06/17/2021 Pathology Results   Diagnosis Breast, right, needle core biopsy, 3:00, 4 cmfn  - INVASIVE MAMMARY CARCINOMA, SEE COMMENT  - MAMMARY CARCINOMA IN SITU. The carcinoma appears grade 1. E-cadherin is positive in the invasive and in situ carcinoma consistent with a ductal phenotype.  PROGNOSTIC INDICATORS Results: IMMUNOHISTOCHEMICAL AND MORPHOMETRIC ANALYSIS PERFORMED MANUALLY The tumor cells are EQUIVOCAL for HER2 (2+). Her2 by FISH will be performed and results reported separately. Estrogen Receptor: 90%, POSITIVE, STRONG STAINING INTENSITY Progesterone Receptor: 5%, POSITIVE, STRONG STAINING INTENSITY Proliferation Marker Ki67: 10%  Her2 negative by [facility]   06/21/2021 Cancer Staging   Staging form: Breast, AJCC 8th Edition - Clinical stage from 06/21/2021: Stage IA (cT1b, cN0, cM0, G1, ER+, PR+, HER2-) - Signed by Truitt Merle, MD on 06/23/2021 Stage prefix: Initial diagnosis Histologic grading system: 3 grade system   06/23/2021 Initial Diagnosis   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 ([place])   07/02/2021 Pathology  Results   FINAL MICROSCOPIC DIAGNOSIS:   A. BREAST, RIGHT, LUMPECTOMY:  - Invasive and in situ ductal carcinoma, 1.2 cm.  - Carcinoma is 0.7 cm from superior margin.  - Biopsy site and biopsy clip.  - See oncology table.   B. BREAST, RIGHT ADDITIONAL INFERIOR MARGIN, EXCISION:  - Fibrocystic changes.  - Final inferior margin negative for carcinoma.   C. LYMPH NODE, RIGHT AXILLARY, SENTINEL, EXCISION:  - One lymph node with capsular nevus and negative for metastatic  carcinoma (0/1).   D. LYMPH NODE, RIGHT AXILLARY, SENTINEL, EXCISION:  - One lymph node negative for metastatic carcinoma (0/1).   E. LYMPH NODE, RIGHT AXILLARY, SENTINEL, EXCISION:  - One lymph node negative for metastatic carcinoma (0/1).   F. LYMPH NODE, RIGHT AXILLARY, SENTINEL, EXCISION:  - One lymph node negative for metastatic carcinoma (0/1).   G. LYMPH NODE, RIGHT AXILLARY, SENTINEL, EXCISION:  - One lymph node negative for metastatic carcinoma (0/1).   H. BREAST, RIGHT ADDITIONAL LATERAL MARGIN, EXCISION:  - Fibrocystic changes.  - Final lateral margin negative for carcinoma.   I. BREAST, RIGHT ADDITIONAL POSTERIOR MARGIN, EXCISION:  - Benign breast tissue.  - Final posterior margin negative for carcinoma.    07/02/2021 Cancer Staging   Staging form: Breast, AJCC 8th Edition - Pathologic stage from 07/02/2021: Stage IA (pT1c, pN0, cM0, G1, ER+, PR+, HER2-, Oncotype DX score: 28) - Signed by Truitt Merle,  MD on 07/21/2021 Stage prefix: Initial diagnosis Multigene prognostic tests performed: Oncotype DX Recurrence score range: Greater than or equal to 11 Histologic grading system: 3 grade system      INTERVAL HISTORY:  DARRIANA DEBOY was contacted for a follow up of breast cancer. She was last seen by me on 07/21/21.  She reports doing well today. She reports she had dizziness, nausea, and vomiting on 08/12/21. She notes she was found to have low potassium and protein and ketones in her urine, and she  received IVF.   All other systems were reviewed with the patient and are negative.  MEDICAL HISTORY:  Past Medical History:  Diagnosis Date   Barrett's esophagus    Basal cell carcinoma of left lower leg    Breast cancer ([place])    right   COPD (chronic obstructive pulmonary disease) ([place]) 2018   Depression 1989   Gastric ulcer    GERD (gastroesophageal reflux disease)    History of colon polyps    Hyperlipidemia    Osteopenia     SURGICAL HISTORY: Past Surgical History:  Procedure Laterality Date   ABDOMINAL HYSTERECTOMY  11/21/1989   APPENDECTOMY  1969   BREAST LUMPECTOMY  07/22/2006   BREAST LUMPECTOMY WITH RADIOACTIVE SEED AND SENTINEL LYMPH NODE BIOPSY Right 07/02/2021   Procedure: RIGHT BREAST LUMPECTOMY WITH RADIOACTIVE SEED AND RIGHT AXILLARY SENTINEL LYMPH NODE BIOPSY;  Surgeon: Rolm Bookbinder, MD;  Location: [facility];  Service: General;  Laterality: Right;   CATARACT EXTRACTION, BILATERAL     EYE SURGERY  08/2013  10/2013   cataracts   TONSILLECTOMY      I have reviewed the social history and family history with the patient and they are unchanged from previous note.  ALLERGIES:  is allergic to amoxicillin-pot clavulanate and moxifloxacin.  MEDICATIONS:  Current Outpatient Medications  Medication Sig Dispense Refill   exemestane (AROMASIN) 25 MG tablet Take 1 tablet (25 mg total) by mouth daily after breakfast. 30 tablet 3   Biotin w/ Vitamins C & E (HAIR SKIN & NAILS GUMMIES PO) Take 2 each by mouth daily.     Calcium-Magnesium-Zinc (CAL-MAG-ZINC PO) Take 2 tablets by mouth daily.     cholecalciferol (VITAMIN D3) 25 MCG (1000 UNIT) tablet Take 1,000 Units by mouth daily.     ezetimibe (ZETIA) 10 MG tablet TAKE ONE TABLET BY MOUTH ONE TIME DAILY 90 tablet 1   FLUoxetine (PROZAC) 10 MG tablet TAKE ONE TABLET BY MOUTH ONE TIME DAILY 90 tablet 1   fluticasone (FLONASE) 50 MCG/ACT nasal spray Place 2 sprays into both nostrils daily. (Patient taking differently: Place  2 sprays into both nostrils daily as needed for allergies.) 16 g 5   hydrocortisone (ANUSOL-HC) 2.5 % rectal cream Place 1 application rectally 2 (two) times daily as needed for hemorrhoids or anal itching. 30 g 1   Misc Natural Products (CHOLESTEROL SUPPORT PO) Take 2 capsules by mouth daily.     Omega-3 Fatty Acids (FISH OIL) 1200 MG CAPS Take 1,200 mg by mouth daily.     ondansetron (ZOFRAN ODT) 4 MG disintegrating tablet Take 1 tablet (4 mg total) by mouth every 8 (eight) hours as needed for nausea or vomiting. 20 tablet 0   pantoprazole (PROTONIX) 40 MG tablet Take 1 tablet (40 mg total) by mouth 2 (two) times daily. 180 tablet 3   potassium chloride (KLOR-CON) 10 MEQ tablet Take 1 tablet (10 mEq total) by mouth daily for 5 days. 5 tablet 0  vitamin C (ASCORBIC ACID) 250 MG tablet Take 250 mg by mouth daily.     XIIDRA 5 % SOLN Place 1 drop into both eyes in the morning and at bedtime.  1   zolpidem (AMBIEN) 5 MG tablet Take 1 tablet (5 mg total) by mouth at bedtime as needed for sleep. 30 tablet 1   No current facility-administered medications for this visit.    PHYSICAL EXAMINATION: ECOG PERFORMANCE STATUS: 0 - Asymptomatic  There were no vitals filed for this visit. Wt Readings from Last 3 Encounters:  07/22/21 120 lb (54.4 kg)  07/21/21 122 lb 8 oz (55.6 kg)  07/02/21 121 lb 3.2 oz (55 kg)     No vitals taken today, Exam not performed today  LABORATORY DATA:  I have reviewed the data as listed CBC Latest Ref Rng & Units 08/12/2021 07/22/2021 06/23/2021  WBC 4.0 - 10.5 K/uL 9.0 5.1 5.6  Hemoglobin 12.0 - 15.0 g/dL 14.7 13.9 13.5  Hematocrit 36.0 - 46.0 % 44.4 41.2 40.6  Platelets 150 - 400 K/uL 220 215.0 174     CMP Latest Ref Rng & Units 08/12/2021 07/22/2021 06/23/2021  Glucose 70 - 99 mg/dL 120(H) 88 82  BUN 8 - 23 mg/dL 21 17 21   Creatinine 0.44 - 1.00 mg/dL 0.76 0.99 0.99  Sodium 135 - 145 mmol/L 139 141 141  Potassium 3.5 - 5.1 mmol/L 3.2(L) 4.0 4.2  Chloride 98 - 111  mmol/L 101 104 106  CO2 22 - 32 mmol/L 24 29 27   Calcium 8.9 - 10.3 mg/dL 9.4 9.7 9.6  Total Protein 6.5 - 8.1 g/dL 8.6(H) 6.9 7.1  Total Bilirubin 0.3 - 1.2 mg/dL 0.9 0.5 0.5  Alkaline Phos 38 - 126 U/L 54 51 49  AST 15 - 41 U/L 32 19 24  ALT 0 - 44 U/L 19 12 17       RADIOGRAPHIC STUDIES: I have personally reviewed the radiological images as listed and agreed with the findings in the report. No results found.    Orders Placed This Encounter  Procedures   DG Bone Density    Standing Status:   Future    Standing Expiration Date:   08/18/2022    Order Specific Question:   Reason for Exam (SYMPTOM  OR DIAGNOSIS REQUIRED)    Answer:   screening    Order Specific Question:   Preferred imaging location?    Answer:   External   All questions were answered. The patient knows to call the clinic with any problems, questions or concerns. No barriers to learning was detected. The total time spent in the appointment was 30 minutes.     Truitt Merle, MD 08/18/2021   I, Wilburn Mylar, am acting as scribe for Truitt Merle, MD.   I have reviewed the above documentation for accuracy and completeness, and I agree with the above.

## 2021-08-19 ENCOUNTER — Ambulatory Visit: Payer: Medicare PPO | Admitting: Physical Therapy

## 2021-08-23 ENCOUNTER — Ambulatory Visit: Payer: Medicare PPO | Admitting: Family Medicine

## 2021-08-23 ENCOUNTER — Ambulatory Visit: Payer: Medicare PPO | Attending: Internal Medicine

## 2021-08-23 ENCOUNTER — Encounter: Payer: Self-pay | Admitting: *Deleted

## 2021-08-23 ENCOUNTER — Other Ambulatory Visit: Payer: Self-pay | Admitting: Family Medicine

## 2021-08-23 ENCOUNTER — Other Ambulatory Visit: Payer: Self-pay

## 2021-08-23 VITALS — BP 134/78 | HR 73 | Temp 97.9°F | Resp 18 | Ht 63.5 in | Wt 122.0 lb

## 2021-08-23 DIAGNOSIS — K649 Unspecified hemorrhoids: Secondary | ICD-10-CM | POA: Diagnosis not present

## 2021-08-23 DIAGNOSIS — R8290 Unspecified abnormal findings in urine: Secondary | ICD-10-CM

## 2021-08-23 DIAGNOSIS — K529 Noninfective gastroenteritis and colitis, unspecified: Secondary | ICD-10-CM | POA: Insufficient documentation

## 2021-08-23 DIAGNOSIS — R82998 Other abnormal findings in urine: Secondary | ICD-10-CM

## 2021-08-23 DIAGNOSIS — Z23 Encounter for immunization: Secondary | ICD-10-CM

## 2021-08-23 LAB — COMPREHENSIVE METABOLIC PANEL
ALT: 16 U/L (ref 0–35)
AST: 22 U/L (ref 0–37)
Albumin: 4.1 g/dL (ref 3.5–5.2)
Alkaline Phosphatase: 50 U/L (ref 39–117)
BUN: 18 mg/dL (ref 6–23)
CO2: 27 mEq/L (ref 19–32)
Calcium: 9.2 mg/dL (ref 8.4–10.5)
Chloride: 103 mEq/L (ref 96–112)
Creatinine, Ser: 0.91 mg/dL (ref 0.40–1.20)
GFR: 62.17 mL/min (ref >60.00)
Glucose, Bld: 75 mg/dL (ref 70–99)
Potassium: 3.9 mEq/L (ref 3.5–5.1)
Sodium: 140 mEq/L (ref 135–145)
Total Bilirubin: 0.5 mg/dL (ref 0.2–1.2)
Total Protein: 6.7 g/dL (ref 6.0–8.3)

## 2021-08-23 LAB — POC URINALSYSI DIPSTICK (AUTOMATED)
Bilirubin, UA: NEGATIVE
Blood, UA: NEGATIVE
Glucose, UA: NEGATIVE
Ketones, UA: NEGATIVE
Nitrite, UA: NEGATIVE
Protein, UA: NEGATIVE
Spec Grav, UA: 1.01 (ref 1.010–1.025)
Urobilinogen, UA: 0.2 E.U./dL
pH, UA: 7.5 (ref 5.0–8.0)

## 2021-08-23 MED ORDER — HYDROCORTISONE (PERIANAL) 2.5 % EX CREA: 1.0000 "application " | TOPICAL_CREAM | Freq: Two times a day (BID) | CUTANEOUS | 1 refills | Status: DC | PRN

## 2021-08-23 NOTE — Progress Notes (Signed)
Established Patient Office Visit  Subjective:  Patient ID: Samantha Miranda, female    DOB: 1946-02-04  Age: 75 y.o. MRN: [id number]  CC:  Chief Complaint  Patient presents with   ED visit follow up    Nausea/ vomiting, abdominal pain, Pt states sxs are better and states sxs are gone     HPI Samantha Miranda presents for f/u from er for gastroenteritis -----  she wants to discuss some lab abnormalities  Specifically ketones in the urine , low potassium    Past Medical History:  Diagnosis Date   Barrett's esophagus    Basal cell carcinoma of left lower leg    Breast cancer ([place])    right   COPD (chronic obstructive pulmonary disease) ([place]) 2018   Depression 1989   Gastric ulcer    GERD (gastroesophageal reflux disease)    History of colon polyps    Hyperlipidemia    Osteopenia     Past Surgical History:  Procedure Laterality Date   ABDOMINAL HYSTERECTOMY  11/21/1989   APPENDECTOMY  1969   BREAST LUMPECTOMY  07/22/2006   BREAST LUMPECTOMY WITH RADIOACTIVE SEED AND SENTINEL LYMPH NODE BIOPSY Right 07/02/2021   Procedure: RIGHT BREAST LUMPECTOMY WITH RADIOACTIVE SEED AND RIGHT AXILLARY SENTINEL LYMPH NODE BIOPSY;  Surgeon: Rolm Bookbinder, MD;  Location: [facility];  Service: General;  Laterality: Right;   CATARACT EXTRACTION, BILATERAL     EYE SURGERY  08/2013  10/2013   cataracts   TONSILLECTOMY      Family History  Problem Relation Age of Onset   Heart failure Mother        CHF   Hyperlipidemia Mother    Aortic stenosis Mother    Arthritis Mother    Heart disease Mother    Lung cancer Father 17   Cancer Father    Early death Father    Breast cancer Sister 61   Cancer Sister    Depression Sister    Stroke Maternal Aunt    Varicose Veins Maternal Aunt    Diabetes Maternal Grandmother    Hyperlipidemia Maternal Grandmother    Colon cancer Maternal Grandfather    Cancer Maternal Grandfather        colon cancer   Cancer Paternal Grandfather        unknown type  cancer   Drug abuse Daughter    Heart attack Neg Hx    Hypertension Neg Hx    Sudden death Neg Hx    Esophageal cancer Neg Hx    Stomach cancer Neg Hx    Rectal cancer Neg Hx     Social History   Socioeconomic History   Marital status: Married    Spouse name: Not on file   Number of children: 1   Years of education: Not on file   Highest education level: Not on file  Occupational History   Occupation: retired    Fish farm manager: [employer]    Comment: retired  Tobacco Use   Smoking status: Former    Packs/day: 0.50    Years: 40.00    Pack years: 20.00    Types: Cigarettes, E-cigarettes    Quit date: 07/23/2011    Years since quitting: 10.0   Smokeless tobacco: Never   Tobacco comments:    The last 5 yrs. I smoked .5 pack a day. Before that, 1 pk. a day  Vaping Use   Vaping Use: Former   Substances: Nicotine  Substance and Sexual Activity  Alcohol use: No    Comment: occasional wine   Drug use: No   Sexual activity: Not Currently    Partners: Male    Birth control/protection: Post-menopausal  Other Topics Concern   Not on file  Social History Narrative   Exercise-no   Social Determinants of Health   Financial Resource Strain: Low Risk    Difficulty of Paying Living Expenses: Not hard at all  Food Insecurity: No Food Insecurity   Worried About Charity fundraiser in the Last Year: Never true   Arboriculturist in the Last Year: Never true  Transportation Needs: No Transportation Needs   Lack of Transportation (Medical): No   Lack of Transportation (Non-Medical): No  Physical Activity: Sufficiently Active   Days of Exercise per Week: 6 days   Minutes of Exercise per Session: 30 min  Stress: No Stress Concern Present   Feeling of Stress : Not at all  Social Connections: Moderately Isolated   Frequency of Communication with Friends and Family: More than three times a week   Frequency of Social Gatherings with Friends and Family: More than three times a  week   Attends Religious Services: Never   Marine scientist or Organizations: No   Attends Music therapist: Never   Marital Status: Married  Human resources officer Violence: Not At Risk   Fear of Current or Ex-Partner: No   Emotionally Abused: No   Physically Abused: No   Sexually Abused: No    Outpatient Medications Prior to Visit  Medication Sig Dispense Refill   Biotin w/ Vitamins C & E (HAIR SKIN & NAILS GUMMIES PO) Take 2 each by mouth daily.     Calcium-Magnesium-Zinc (CAL-MAG-ZINC PO) Take 2 tablets by mouth daily.     cholecalciferol (VITAMIN D3) 25 MCG (1000 UNIT) tablet Take 1,000 Units by mouth daily.     exemestane (AROMASIN) 25 MG tablet Take 1 tablet (25 mg total) by mouth daily after breakfast. 30 tablet 3   ezetimibe (ZETIA) 10 MG tablet TAKE ONE TABLET BY MOUTH ONE TIME DAILY 90 tablet 1   FLUoxetine (PROZAC) 10 MG tablet TAKE ONE TABLET BY MOUTH ONE TIME DAILY 90 tablet 1   fluticasone (FLONASE) 50 MCG/ACT nasal spray Place 2 sprays into both nostrils daily. (Patient taking differently: Place 2 sprays into both nostrils daily as needed for allergies.) 16 g 5   Misc Natural Products (CHOLESTEROL SUPPORT PO) Take 2 capsules by mouth daily.     Omega-3 Fatty Acids (FISH OIL) 1200 MG CAPS Take 1,200 mg by mouth daily.     ondansetron (ZOFRAN ODT) 4 MG disintegrating tablet Take 1 tablet (4 mg total) by mouth every 8 (eight) hours as needed for nausea or vomiting. 20 tablet 0   pantoprazole (PROTONIX) 40 MG tablet Take 1 tablet (40 mg total) by mouth 2 (two) times daily. 180 tablet 3   vitamin C (ASCORBIC ACID) 250 MG tablet Take 250 mg by mouth daily.     XIIDRA 5 % SOLN Place 1 drop into both eyes in the morning and at bedtime.  1   zolpidem (AMBIEN) 5 MG tablet Take 1 tablet (5 mg total) by mouth at bedtime as needed for sleep. 30 tablet 1   hydrocortisone (ANUSOL-HC) 2.5 % rectal cream Place 1 application rectally 2 (two) times daily as needed for  hemorrhoids or anal itching. 30 g 1   potassium chloride (KLOR-CON) 10 MEQ tablet Take 1 tablet (10 mEq total)  by mouth daily for 5 days. 5 tablet 0   No facility-administered medications prior to visit.    Allergies  Allergen Reactions   Amoxicillin-Pot Clavulanate Nausea And Vomiting   Moxifloxacin Nausea And Vomiting    ROS Review of Systems  Constitutional:  Negative for activity change, appetite change, fatigue, fever and unexpected weight change.  HENT:  Negative for congestion.   Respiratory:  Negative for cough and shortness of breath.   Cardiovascular:  Negative for chest pain, palpitations and leg swelling.  Gastrointestinal:  Negative for abdominal pain, blood in stool, nausea and vomiting.  Genitourinary:  Negative for dysuria and frequency.  Musculoskeletal:  Negative for back pain.  Skin:  Negative for rash.  Allergic/Immunologic: Negative for environmental allergies.  Neurological:  Negative for dizziness and headaches.  Psychiatric/Behavioral:  Negative for behavioral problems and dysphoric mood. The patient is not nervous/anxious.      Objective:    Physical Exam Vitals and nursing note reviewed.  Constitutional:      Appearance: She is well-developed.  HENT:     Head: Normocephalic and atraumatic.  Eyes:     Conjunctiva/sclera: Conjunctivae normal.  Neck:     Thyroid: No thyromegaly.     Vascular: No carotid bruit or JVD.  Cardiovascular:     Rate and Rhythm: Normal rate and regular rhythm.     Heart sounds: Normal heart sounds. No murmur heard. Pulmonary:     Effort: Pulmonary effort is normal. No respiratory distress.     Breath sounds: Normal breath sounds. No wheezing or rales.  Chest:     Chest wall: No tenderness.  Musculoskeletal:     Cervical back: Normal range of motion and neck supple.  Neurological:     Mental Status: She is alert and oriented to person, place, and time.    BP 134/78 (BP Location: Left Arm, Patient Position: Sitting,  Cuff Size: Normal)   Pulse 73   Temp 97.9 F (36.6 C) (Oral)   Resp 18   Ht 5' 3.5" (1.613 m)   Wt 122 lb (55.3 kg)   SpO2 96%   BMI 21.27 kg/m  Wt Readings from Last 3 Encounters:  08/23/21 122 lb (55.3 kg)  07/22/21 120 lb (54.4 kg)  07/21/21 122 lb 8 oz (55.6 kg)     Health Maintenance Due  Topic Date Due   COVID-19 Vaccine (4 - Booster for Pfizer series) 12/09/2020    There are no preventive care reminders to display for this patient.  Lab Results  Component Value Date   TSH 1.07 08/04/2017   Lab Results  Component Value Date   WBC 9.0 08/12/2021   HGB 14.7 08/12/2021   HCT 44.4 08/12/2021   MCV 91.9 08/12/2021   PLT 220 08/12/2021   Lab Results  Component Value Date   NA 139 08/12/2021   K 3.2 (L) 08/12/2021   CO2 24 08/12/2021   GLUCOSE 120 (H) 08/12/2021   BUN 21 08/12/2021   CREATININE 0.76 08/12/2021   BILITOT 0.9 08/12/2021   ALKPHOS 54 08/12/2021   AST 32 08/12/2021   ALT 19 08/12/2021   PROT 8.6 (H) 08/12/2021   ALBUMIN 4.9 08/12/2021   CALCIUM 9.4 08/12/2021   ANIONGAP 14 08/12/2021   GFR 56.23 (L) 07/22/2021   Lab Results  Component Value Date   CHOL 220 (H) 07/22/2021   Lab Results  Component Value Date   HDL 65.70 07/22/2021   Lab Results  Component Value Date   LDLCALC 135 (  H) 07/22/2021   Lab Results  Component Value Date   TRIG 95.0 07/22/2021   Lab Results  Component Value Date   CHOLHDL 3 07/22/2021   Lab Results  Component Value Date   HGBA1C 5.1 08/04/2017      Assessment & Plan:   Problem List Items Addressed This Visit       Unprioritized   Gastroenteritis    Discussed that her lab abnormalities were from her nausea and vomiting but will recheck today  Symptoms have resolved      Relevant Orders   Comprehensive metabolic panel   POCT Urinalysis Dipstick (Automated) (Completed)   Other Visit Diagnoses     Hemorrhoids, unspecified hemorrhoid type    -  Primary   Relevant Medications    hydrocortisone (ANUSOL-HC) 2.5 % rectal cream       Meds ordered this encounter  Medications   hydrocortisone (ANUSOL-HC) 2.5 % rectal cream    Sig: Place 1 application rectally 2 (two) times daily as needed for hemorrhoids or anal itching.    Dispense:  30 g    Refill:  1    Follow-up: Return if symptoms worsen or fail to improve.    Ann Held, DO

## 2021-08-23 NOTE — Progress Notes (Signed)
   Covid-19 Vaccination Clinic  Name:  Samantha Miranda    MRN: [id number] DOB: [birth year]  08/23/2021  [name] was observed post Covid-19 immunization for 15 minutes without incident. She was provided with Vaccine Information Sheet and instruction to access the V-Safe system.   [name] was instructed to call 911 with any severe reactions post vaccine: Difficulty breathing  Swelling of face and throat  A fast heartbeat  A bad rash all over body  Dizziness and weakness

## 2021-08-23 NOTE — Assessment & Plan Note (Addendum)
Discussed that her lab abnormalities were from her nausea and vomiting but will recheck today  Symptoms have resolved

## 2021-08-23 NOTE — Addendum Note (Signed)
Addended by: Sanda Linger on: 08/23/2021 01:32 PM   Modules accepted: Orders

## 2021-08-23 NOTE — Patient Instructions (Signed)

## 2021-08-24 LAB — URINE CULTURE
MICRO NUMBER:: 12452163
Result:: NO GROWTH
SPECIMEN QUALITY:: ADEQUATE

## 2021-08-25 DIAGNOSIS — D485 Neoplasm of uncertain behavior of skin: Secondary | ICD-10-CM | POA: Diagnosis not present

## 2021-08-25 DIAGNOSIS — D1801 Hemangioma of skin and subcutaneous tissue: Secondary | ICD-10-CM | POA: Diagnosis not present

## 2021-08-25 DIAGNOSIS — L821 Other seborrheic keratosis: Secondary | ICD-10-CM | POA: Diagnosis not present

## 2021-08-25 DIAGNOSIS — Z08 Encounter for follow-up examination after completed treatment for malignant neoplasm: Secondary | ICD-10-CM | POA: Diagnosis not present

## 2021-08-25 DIAGNOSIS — L281 Prurigo nodularis: Secondary | ICD-10-CM | POA: Diagnosis not present

## 2021-08-25 DIAGNOSIS — Z85828 Personal history of other malignant neoplasm of skin: Secondary | ICD-10-CM | POA: Diagnosis not present

## 2021-08-25 DIAGNOSIS — D225 Melanocytic nevi of trunk: Secondary | ICD-10-CM | POA: Diagnosis not present

## 2021-08-25 DIAGNOSIS — L814 Other melanin hyperpigmentation: Secondary | ICD-10-CM | POA: Diagnosis not present

## 2021-08-31 ENCOUNTER — Other Ambulatory Visit (HOSPITAL_BASED_OUTPATIENT_CLINIC_OR_DEPARTMENT_OTHER): Payer: Self-pay

## 2021-08-31 MED ORDER — COVID-19MRNA BIVAL VACC PFIZER 30 MCG/0.3ML IM SUSP
INTRAMUSCULAR | 0 refills | Status: DC
  Filled 2021-08-31: qty 0.3, 1d supply, fill #0

## 2021-09-08 ENCOUNTER — Telehealth: Payer: Self-pay | Admitting: Hematology

## 2021-09-08 NOTE — Telephone Encounter (Signed)
Sch per 10/19, pt aware

## 2021-09-14 ENCOUNTER — Encounter: Payer: Self-pay | Admitting: Family Medicine

## 2021-09-14 ENCOUNTER — Other Ambulatory Visit (HOSPITAL_BASED_OUTPATIENT_CLINIC_OR_DEPARTMENT_OTHER): Payer: Self-pay | Admitting: Hematology

## 2021-09-14 DIAGNOSIS — E2839 Other primary ovarian failure: Secondary | ICD-10-CM

## 2021-09-21 ENCOUNTER — Other Ambulatory Visit: Payer: Self-pay

## 2021-09-21 ENCOUNTER — Ambulatory Visit (HOSPITAL_BASED_OUTPATIENT_CLINIC_OR_DEPARTMENT_OTHER)
Admission: RE | Admit: 2021-09-21 | Discharge: 2021-09-21 | Disposition: A | Discharge status: Home / Self Care | Payer: Medicare PPO | Source: Ambulatory Visit | Attending: Hematology | Admitting: Hematology

## 2021-09-21 DIAGNOSIS — M810 Age-related osteoporosis without current pathological fracture: Secondary | ICD-10-CM | POA: Diagnosis not present

## 2021-09-21 DIAGNOSIS — Z780 Asymptomatic menopausal state: Secondary | ICD-10-CM | POA: Diagnosis not present

## 2021-09-21 DIAGNOSIS — E2839 Other primary ovarian failure: Secondary | ICD-10-CM | POA: Insufficient documentation

## 2021-09-28 ENCOUNTER — Encounter: Payer: Self-pay | Admitting: Hematology

## 2021-09-28 ENCOUNTER — Other Ambulatory Visit: Payer: Self-pay | Admitting: Nurse Practitioner

## 2021-09-28 MED ORDER — GABAPENTIN 100 MG PO CAPS: 100.0000 mg | ORAL_CAPSULE | Freq: Every day | ORAL | 0 refills | Status: DC

## 2021-10-04 ENCOUNTER — Telehealth: Payer: Self-pay | Admitting: *Deleted

## 2021-10-04 NOTE — Telephone Encounter (Signed)
Per Dr.Feng, called pt with message below. Pt has been scheduled for 11/22 with Lacie Burton,NP at 12pm. Pt verbalized understanding

## 2021-10-04 NOTE — Telephone Encounter (Signed)
-----   Message from Truitt Merle, MD sent at 10/03/2021  5:23 PM EST ----- Please let pt know she has osteoporosis, we can move her appointment with lacie earlier if she wants to discuss the result and management of her bone health.   Thanks   Truitt Merle

## 2021-10-10 NOTE — Progress Notes (Signed)
[name]   Telephone:([phone]) [phone] Fax:(336) [phone]   Clinic Follow up Note   Patient Care Team: Carollee Herter, Alferd Apa, DO as PCP - General Shirley Muscat, Loreen Freud, MD as Referring Physician (Optometry) Izora Ribas (Dermatology) Mauro Kaufmann, RN as Oncology Nurse Navigator Rockwell Germany, RN as Oncology Nurse Navigator Rolm Bookbinder, MD as Consulting Physician (General Surgery) Truitt Merle, MD as Consulting Physician (Hematology) Kyung Rudd, MD as Consulting Physician (Radiation Oncology) 10/12/2021  CHIEF COMPLAINT: Review DEXA result and management   SUMMARY OF ONCOLOGIC HISTORY: Oncology History Overview Note  Cancer Staging 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 [facility]) Staging form: Breast, AJCC 8th Edition - Clinical stage from 06/21/2021: Stage IA (cT1b, cN0, cM0, G1, ER+, PR+, HER2-) - Signed by Truitt Merle, MD on 06/23/2021 Stage prefix: Initial diagnosis Histologic grading system: 3 grade system - Pathologic stage from 07/02/2021: Stage IA (pT1c, pN0, cM0, G1, ER+, PR+, HER2-, Oncotype DX score: 28) - Signed by Truitt Merle, MD on 07/21/2021 Stage prefix: Initial diagnosis Multigene prognostic tests performed: Oncotype DX Recurrence score range: Greater than or equal to 11 Histologic grading system: 3 grade system     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   06/17/2021 Pathology Results   Diagnosis Breast, right, needle core biopsy, 3:00, 4 cmfn  - INVASIVE MAMMARY CARCINOMA, SEE COMMENT  - MAMMARY CARCINOMA IN SITU. The carcinoma appears grade 1. E-cadherin is positive in the invasive and in situ carcinoma consistent with a ductal phenotype.  PROGNOSTIC INDICATORS Results: IMMUNOHISTOCHEMICAL AND MORPHOMETRIC ANALYSIS PERFORMED MANUALLY The  tumor cells are EQUIVOCAL for HER2 (2+). Her2 by FISH will be performed and results reported separately. 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 BREAST, RIGHT, LUMPECTOMY:  - Invasive and in situ ductal carcinoma, 1.2 cm.  - Carcinoma is 0.7 cm from superior margin.  - Biopsy site and biopsy clip.  - See oncology table.   B. BREAST, RIGHT ADDITIONAL INFERIOR MARGIN, EXCISION:  - Fibrocystic changes.  - Final inferior margin negative for carcinoma.   C. LYMPH NODE, RIGHT AXILLARY, SENTINEL, EXCISION:  - One lymph node with capsular nevus and negative for metastatic  carcinoma (0/1).   D. LYMPH NODE, RIGHT AXILLARY, SENTINEL, EXCISION:  - One lymph node negative for metastatic carcinoma (0/1).   E. LYMPH NODE, RIGHT AXILLARY, SENTINEL, EXCISION:  - One lymph node negative for metastatic carcinoma (0/1).   F. LYMPH NODE, RIGHT AXILLARY, SENTINEL, EXCISION:  - One lymph node negative for metastatic carcinoma (0/1).   G. LYMPH NODE, RIGHT AXILLARY, SENTINEL, EXCISION:  - One lymph node negative for metastatic carcinoma (0/1).   H. BREAST, RIGHT ADDITIONAL LATERAL MARGIN, EXCISION:  - Fibrocystic changes.  - Final lateral margin negative for carcinoma.  I. BREAST, RIGHT ADDITIONAL POSTERIOR MARGIN, EXCISION:  - Benign breast tissue.  - Final posterior margin negative for carcinoma.    07/02/2021 Cancer Staging   Staging form:  Breast, AJCC 8th Edition - Pathologic stage from 07/02/2021: Stage IA (pT1c, pN0, cM0, G1, ER+, PR+, HER2-, Oncotype DX score: 28) - Signed by Truitt Merle, MD on 07/21/2021 Stage prefix: Initial diagnosis Multigene prognostic tests performed: Oncotype DX Recurrence score range: Greater than or equal to 11 Histologic grading system: 3 grade system      CURRENT THERAPY: Adjuvant exemestane   INTERVAL HISTORY: Ms. Samantha Miranda returns for follow up to discuss DEXA result which shows osteoporosis at the L femur neck T score -2.8. she had osteopenia years ago. She takes daily calcium and vitamin D and does weight bearing exercise plus hula hooping. Still has numbness and tingling in the right arm/hand but gabapentin is helping. She started exemestane 4-5 weeks ago. Since then she has hair loss requiring a wig, weight gain in her belly, and depression. She feels irritable and doesn't want to be around family. She has withdrawn socially and just watches tv. She feels hopeless at time. Denies SI/HI. She was on low dose prozac and cut her dose in 1/2 when she started exemestane. She wants to wean off completely. She wants to see if she can recover when medication is out of her system.    MEDICAL HISTORY:  Past Medical History:  Diagnosis Date   Barrett's esophagus    Basal cell carcinoma of left lower leg    Breast cancer ([place])    right   COPD (chronic obstructive pulmonary disease) ([place]) 2018   Depression 1989   Gastric ulcer    GERD (gastroesophageal reflux disease)    History of colon polyps    Hyperlipidemia    Osteopenia     SURGICAL HISTORY: Past Surgical History:  Procedure Laterality Date   ABDOMINAL HYSTERECTOMY  11/21/1989   APPENDECTOMY  1969   BREAST LUMPECTOMY  07/22/2006   BREAST LUMPECTOMY WITH RADIOACTIVE SEED AND SENTINEL LYMPH NODE BIOPSY Right 07/02/2021   Procedure: RIGHT BREAST LUMPECTOMY WITH RADIOACTIVE SEED AND RIGHT AXILLARY SENTINEL LYMPH NODE BIOPSY;  Surgeon: Rolm Bookbinder, MD;  Location: [facility];  Service: General;  Laterality: Right;   CATARACT EXTRACTION, BILATERAL     EYE SURGERY  08/2013  10/2013   cataracts   TONSILLECTOMY      I have reviewed the social history and family history with the patient and they are unchanged from previous note.  ALLERGIES:  is allergic to amoxicillin-pot clavulanate and moxifloxacin.  MEDICATIONS:  Current Outpatient Medications  Medication Sig Dispense Refill   Biotin w/ Vitamins C & E (HAIR SKIN & NAILS GUMMIES PO) Take 2 each by mouth daily.     Calcium-Magnesium-Zinc (CAL-MAG-ZINC PO) Take 2 tablets by mouth daily.     cholecalciferol (VITAMIN D3) 25 MCG (1000 UNIT) tablet Take 1,000 Units by mouth daily.     COVID-19 mRNA bivalent vaccine, Pfizer, injection Inject into the muscle. 0.3 mL 0   exemestane (AROMASIN) 25 MG tablet Take 1 tablet (25 mg total) by mouth daily after breakfast. 30 tablet 3   ezetimibe (ZETIA) 10 MG tablet TAKE ONE TABLET BY MOUTH ONE TIME DAILY 90 tablet 1   FLUoxetine (PROZAC) 10 MG tablet TAKE ONE TABLET BY MOUTH ONE TIME DAILY 90 tablet 1   fluticasone (FLONASE) 50 MCG/ACT nasal spray Place 2 sprays into both nostrils daily. (Patient taking differently: Place 2 sprays  into both nostrils daily as needed for allergies.) 16 g 5   gabapentin (NEURONTIN) 100 MG capsule Take 1 capsule (100 mg total) by mouth at bedtime. May gradually increase to 3 capsules (300 mg total) at night if tolerable 90 capsule 0   hydrocortisone (ANUSOL-HC) 2.5 % rectal cream Place 1 application rectally 2 (two) times daily as needed for hemorrhoids or anal itching. 30 g 1   Misc Natural Products (CHOLESTEROL SUPPORT PO) Take 2 capsules by mouth daily.     Omega-3 Fatty Acids (FISH OIL) 1200 MG CAPS Take 1,200 mg by mouth daily.     ondansetron (ZOFRAN ODT) 4 MG disintegrating tablet Take 1 tablet (4 mg total) by mouth every 8 (eight) hours as needed for nausea or vomiting. 20 tablet 0   pantoprazole (PROTONIX) 40  MG tablet Take 1 tablet (40 mg total) by mouth 2 (two) times daily. 180 tablet 3   potassium chloride (KLOR-CON) 10 MEQ tablet Take 1 tablet (10 mEq total) by mouth daily for 5 days. 5 tablet 0   vitamin C (ASCORBIC ACID) 250 MG tablet Take 250 mg by mouth daily.     XIIDRA 5 % SOLN Place 1 drop into both eyes in the morning and at bedtime.  1   zolpidem (AMBIEN) 5 MG tablet Take 1 tablet (5 mg total) by mouth at bedtime as needed for sleep. 30 tablet 1   No current facility-administered medications for this visit.    PHYSICAL EXAMINATION:  Vitals:   10/12/21 1219  BP: (!) 160/82  Pulse: 80  Resp: 18  Temp: 98.1 F (36.7 C)  SpO2: 95%   Filed Weights   10/12/21 1219  Weight: 122 lb 11.2 oz (55.7 kg)    GENERAL:alert, no distress and comfortable SKIN: no rash  EYES: sclera clear LUNGS: normal breathing effort HEART:  no lower extremity edema NEURO: alert & oriented x 3 with fluent speech  LABORATORY DATA:  I have reviewed the data as listed CBC Latest Ref Rng & Units 08/12/2021 07/22/2021 06/23/2021  WBC 4.0 - 10.5 K/uL 9.0 5.1 5.6  Hemoglobin 12.0 - 15.0 g/dL 14.7 13.9 13.5  Hematocrit 36.0 - 46.0 % 44.4 41.2 40.6  Platelets 150 - 400 K/uL 220 215.0 174     CMP Latest Ref Rng & Units 08/23/2021 08/12/2021 07/22/2021  Glucose 70 - 99 mg/dL 75 120(H) 88  BUN 6 - 23 mg/dL 18 21 17   Creatinine 0.40 - 1.20 mg/dL 0.91 0.76 0.99  Sodium 135 - 145 mEq/L 140 139 141  Potassium 3.5 - 5.1 mEq/L 3.9 3.2(L) 4.0  Chloride 96 - 112 mEq/L 103 101 104  CO2 19 - 32 mEq/L 27 24 29   Calcium 8.4 - 10.5 mg/dL 9.2 9.4 9.7  Total Protein 6.0 - 8.3 g/dL 6.7 8.6(H) 6.9  Total Bilirubin 0.2 - 1.2 mg/dL 0.5 0.9 0.5  Alkaline Phos 39 - 117 U/L 50 54 51  AST 0 - 37 U/L 22 32 19  ALT 0 - 35 U/L 16 19 12       RADIOGRAPHIC STUDIES: I have personally reviewed the radiological images as listed and agreed with the findings in the report. No results found.   ASSESSMENT & PLAN: Samantha Miranda is a  75 y.o. female with    1. 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 Donne Hazel  -given her previous right breast radiation, additional  radiation is not recommended. -RS of 28, risk of 17%, chemo was recommended but pt declined -Given her hysterectomy and osteoporosis tamoxifen was recommended, she ultimately decided to proceed with exemestane to avoid switching from Prozac to another antidepressant but she did that anyway -Tolerating exemestane poorly with hair loss, weight gain, and depression, she will stop today -SCP in 1 month to discuss alternative antiestrogen   2.  Depression  -She started taking Prozac after the death of her sister, on low-dose 10 mg daily since then -She self weaned herself to 5 mg 4-5 weeks ago when she started exemestane, she is depressed but wants to come off completely. She does not want to start any new anti-depressants right now - I recommend she take 5 mg every other day for 1 week then stop, patient gave me permission to discuss with Dr. Etter Sjogren, she understands weaning off antidepressants needs to be monitored -She repeatedly declined mental health/social work referral -Denies SI/HI.  I strongly encouraged her to consider referral to a specialist.  She will think about it -She agrees to contact our office if her mental health declines in the next few weeks before upcoming follow-up -I cc'd my note to her PCP Dr. Etter Sjogren   3. H/o Right Breast DCIS, ER+/PR+ -diagnosed in 2007, s/p surgery and radiation therapy. She did not receive antiestrogen therapy.   4. Bone Health  -She reportedly had osteopenia, the outside record is not available  -Repeat DEXA 09/21/2021 shows osteoporosis in the left femur neck with T score -2.8, I reviewed with her today -She currently takes calcium, vitamin D, and performs weightbearing exercise -We discussed treatment  strategies including continuing AI and beginning bisphosphonate such as Fosamax or IV Zometa -I reviewed switching to tamoxifen with/without bisphosphonate -She wants a month off her medications and time to think, which is reasonable -We will discuss further at [facility] 12/28   5. Genetic Testing -due to her strong family history of cancer, she is eligible for genetic testing.  -I recommended this given she has a daughter. She declines, as she does not want to know.   6. Social Support -her sister had breast cancer, was treated with chemo and radiation, and ultimately still died from it. -she is estranged from her daughter due to her daughter's dug use/addiction.  -she has support at home from her husband. -Declined social work referral today  PLAN -DEXA reviewed, osteoporosis -Continue calcium, vitamin D, and weightbearing exercise -Patient will think about bisphosphonate -Stop exemestane due to poor tolerance -SCP 11/17/2021, will discuss alternative antiestrogen such as tamoxifen -Patient declined social work/mental health referral, will contact her office if her depression worsens  All questions were answered. The patient knows to call the clinic with any problems, questions or concerns. No barriers to learning was detected. I spent 20 minutes counseling the patient face to face. The total time spent in the appointment was 30 minutes and more than 50% was on counseling and review of test results     Alla Feeling, NP 10/12/21

## 2021-10-12 ENCOUNTER — Encounter: Payer: Self-pay | Admitting: Nurse Practitioner

## 2021-10-12 ENCOUNTER — Inpatient Hospital Stay: Payer: Medicare PPO | Attending: Hematology | Admitting: Nurse Practitioner

## 2021-10-12 ENCOUNTER — Other Ambulatory Visit: Payer: Self-pay

## 2021-10-12 VITALS — BP 160/82 | HR 80 | Temp 98.1°F | Resp 18 | Ht 63.5 in | Wt 122.7 lb

## 2021-10-12 DIAGNOSIS — C50111 Malignant neoplasm of central portion of right female breast: Secondary | ICD-10-CM | POA: Insufficient documentation

## 2021-10-12 DIAGNOSIS — F32A Depression, unspecified: Secondary | ICD-10-CM | POA: Diagnosis not present

## 2021-10-12 DIAGNOSIS — Z170 Estrogen receptor positive status [ER+]: Secondary | ICD-10-CM | POA: Insufficient documentation

## 2021-10-12 DIAGNOSIS — M810 Age-related osteoporosis without current pathological fracture: Secondary | ICD-10-CM | POA: Diagnosis not present

## 2021-10-22 ENCOUNTER — Telehealth: Payer: Self-pay | Admitting: Nurse Practitioner

## 2021-10-22 NOTE — Telephone Encounter (Signed)
Rescheduled upcoming appointment due to provider's PAL. Patient is aware of changes. ?

## 2021-11-01 ENCOUNTER — Ambulatory Visit: Payer: Medicare PPO | Admitting: Family Medicine

## 2021-11-01 ENCOUNTER — Encounter: Payer: Self-pay | Admitting: Family Medicine

## 2021-11-01 VITALS — BP 142/80 | HR 82 | Temp 98.5°F | Resp 18 | Ht 63.5 in | Wt 124.0 lb

## 2021-11-01 DIAGNOSIS — R051 Acute cough: Secondary | ICD-10-CM

## 2021-11-01 DIAGNOSIS — J40 Bronchitis, not specified as acute or chronic: Secondary | ICD-10-CM | POA: Diagnosis not present

## 2021-11-01 MED ORDER — AZITHROMYCIN 250 MG PO TABS: ORAL_TABLET | ORAL | 0 refills | Status: DC

## 2021-11-01 MED ORDER — PROMETHAZINE-DM 6.25-15 MG/5ML PO SYRP: 5.0000 mL | ORAL_SOLUTION | Freq: Four times a day (QID) | ORAL | 0 refills | Status: DC | PRN

## 2021-11-01 MED ORDER — PREDNISONE 10 MG PO TABS: ORAL_TABLET | ORAL | 0 refills | Status: DC

## 2021-11-01 NOTE — Progress Notes (Addendum)
Subjective:   By signing my name below, I, Zite Okoli, attest that this documentation has been prepared under the direction and in the presence of Ann Held, DO. 11/01/2021   Patient ID: Samantha Miranda, female    DOB: [birth year], 75 y.o.   MRN: [id number]  Chief Complaint  Patient presents with   Cough    X1 week, productive cough, SOB, and fatigue. Pt reports Negative COVID test couple of days ago.     HPI Patient is in today for an office visit.  She reports having a productive cough for about a week. Sputum is greenish. Endorses chest pain. She has used mucinex but it did not help. Mentions that she has been using Vicks vapor rub on her feet to help her sleep.   No fevers, congestion, sinus pressure.  Past Medical History:  Diagnosis Date   Barrett's esophagus    Basal cell carcinoma of left lower leg    Breast cancer (HCC)    right   COPD (chronic obstructive pulmonary disease) ([place]) 2018   Depression 1989   Gastric ulcer    GERD (gastroesophageal reflux disease)    History of colon polyps    Hyperlipidemia    Osteopenia     Past Surgical History:  Procedure Laterality Date   ABDOMINAL HYSTERECTOMY  11/21/1989   APPENDECTOMY  1969   BREAST LUMPECTOMY  07/22/2006   BREAST LUMPECTOMY WITH RADIOACTIVE SEED AND SENTINEL LYMPH NODE BIOPSY Right 07/02/2021   Procedure: RIGHT BREAST LUMPECTOMY WITH RADIOACTIVE SEED AND RIGHT AXILLARY SENTINEL LYMPH NODE BIOPSY;  Surgeon: Rolm Bookbinder, MD;  Location: [facility];  Service: General;  Laterality: Right;   CATARACT EXTRACTION, BILATERAL     EYE SURGERY  08/2013  10/2013   cataracts   TONSILLECTOMY      Family History  Problem Relation Age of Onset   Heart failure Mother        CHF   Hyperlipidemia Mother    Aortic stenosis Mother    Arthritis Mother    Heart disease Mother    Lung cancer Father 48   Cancer Father    Early death Father    Breast cancer Sister 78   Cancer Sister    Depression Sister     Stroke Maternal Aunt    Varicose Veins Maternal Aunt    Diabetes Maternal Grandmother    Hyperlipidemia Maternal Grandmother    Colon cancer Maternal Grandfather    Cancer Maternal Grandfather        colon cancer   Cancer Paternal Grandfather        unknown type cancer   Drug abuse Daughter    Heart attack Neg Hx    Hypertension Neg Hx    Sudden death Neg Hx    Esophageal cancer Neg Hx    Stomach cancer Neg Hx    Rectal cancer Neg Hx     Social History   Socioeconomic History   Marital status: Married    Spouse name: Not on file   Number of children: 1   Years of education: Not on file   Highest education level: Not on file  Occupational History   Occupation: retired    Fish farm manager: [employer]    Comment: retired  Tobacco Use   Smoking status: Former    Packs/day: 0.50    Years: 40.00    Pack years: 20.00    Types: Cigarettes, E-cigarettes    Quit date: 07/23/2011  Years since quitting: 10.2   Smokeless tobacco: Never   Tobacco comments:    The last 5 yrs. I smoked .5 pack a day. Before that, 1 pk. a day  Vaping Use   Vaping Use: Former   Substances: Nicotine  Substance and Sexual Activity   Alcohol use: No    Comment: occasional wine   Drug use: No   Sexual activity: Not Currently    Partners: Male    Birth control/protection: Post-menopausal  Other Topics Concern   Not on file  Social History Narrative   Exercise-no   Social Determinants of Health   Financial Resource Strain: Low Risk    Difficulty of Paying Living Expenses: Not hard at all  Food Insecurity: No Food Insecurity   Worried About Charity fundraiser in the Last Year: Never true   Arboriculturist in the Last Year: Never true  Transportation Needs: No Transportation Needs   Lack of Transportation (Medical): No   Lack of Transportation (Non-Medical): No  Physical Activity: Sufficiently Active   Days of Exercise per Week: 6 days   Minutes of Exercise per Session: 30 min  Stress:  No Stress Concern Present   Feeling of Stress : Not at all  Social Connections: Moderately Isolated   Frequency of Communication with Friends and Family: More than three times a week   Frequency of Social Gatherings with Friends and Family: More than three times a week   Attends Religious Services: Never   Marine scientist or Organizations: No   Attends Music therapist: Never   Marital Status: Married  Human resources officer Violence: Not At Risk   Fear of Current or Ex-Partner: No   Emotionally Abused: No   Physically Abused: No   Sexually Abused: No    Outpatient Medications Prior to Visit  Medication Sig Dispense Refill   Biotin w/ Vitamins C & E (HAIR SKIN & NAILS GUMMIES PO) Take 2 each by mouth daily.     Calcium-Magnesium-Zinc (CAL-MAG-ZINC PO) Take 2 tablets by mouth daily.     cholecalciferol (VITAMIN D3) 25 MCG (1000 UNIT) tablet Take 1,000 Units by mouth daily.     COVID-19 mRNA bivalent vaccine, Pfizer, injection Inject into the muscle. 0.3 mL 0   ezetimibe (ZETIA) 10 MG tablet TAKE ONE TABLET BY MOUTH ONE TIME DAILY 90 tablet 1   FLUoxetine (PROZAC) 10 MG tablet TAKE ONE TABLET BY MOUTH ONE TIME DAILY 90 tablet 1   fluticasone (FLONASE) 50 MCG/ACT nasal spray Place 2 sprays into both nostrils daily. (Patient taking differently: Place 2 sprays into both nostrils daily as needed for allergies.) 16 g 5   gabapentin (NEURONTIN) 100 MG capsule Take 1 capsule (100 mg total) by mouth at bedtime. May gradually increase to 3 capsules (300 mg total) at night if tolerable 90 capsule 0   hydrocortisone (ANUSOL-HC) 2.5 % rectal cream Place 1 application rectally 2 (two) times daily as needed for hemorrhoids or anal itching. 30 g 1   Misc Natural Products (CHOLESTEROL SUPPORT PO) Take 2 capsules by mouth daily.     Omega-3 Fatty Acids (FISH OIL) 1200 MG CAPS Take 1,200 mg by mouth daily.     ondansetron (ZOFRAN ODT) 4 MG disintegrating tablet Take 1 tablet (4 mg total) by  mouth every 8 (eight) hours as needed for nausea or vomiting. 20 tablet 0   pantoprazole (PROTONIX) 40 MG tablet Take 1 tablet (40 mg total) by mouth 2 (two) times daily.  180 tablet 3   vitamin C (ASCORBIC ACID) 250 MG tablet Take 250 mg by mouth daily.     XIIDRA 5 % SOLN Place 1 drop into both eyes in the morning and at bedtime.  1   zolpidem (AMBIEN) 5 MG tablet Take 1 tablet (5 mg total) by mouth at bedtime as needed for sleep. 30 tablet 1   potassium chloride (KLOR-CON) 10 MEQ tablet Take 1 tablet (10 mEq total) by mouth daily for 5 days. 5 tablet 0   No facility-administered medications prior to visit.    Allergies  Allergen Reactions   Amoxicillin-Pot Clavulanate Nausea And Vomiting   Moxifloxacin Nausea And Vomiting    Review of Systems  Constitutional:  Negative for fever.  HENT:  Negative for congestion, ear pain, hearing loss, sinus pain and sore throat.   Eyes:  Negative for blurred vision and pain.  Respiratory:  Positive for cough and sputum production. Negative for shortness of breath and wheezing.   Cardiovascular:  Negative for chest pain and palpitations.  Gastrointestinal:  Negative for blood in stool, constipation, diarrhea, nausea and vomiting.  Genitourinary:  Negative for dysuria, frequency, hematuria and urgency.  Musculoskeletal:  Negative for back pain, falls and myalgias.  Neurological:  Negative for dizziness, sensory change, loss of consciousness, weakness and headaches.  Endo/Heme/Allergies:  Negative for environmental allergies. Does not bruise/bleed easily.  Psychiatric/Behavioral:  Negative for depression and suicidal ideas. The patient is not nervous/anxious and does not have insomnia.       Objective:    Physical Exam Vitals and nursing note reviewed.  Constitutional:      General: She is not in acute distress.    Appearance: Normal appearance. She is not ill-appearing.  HENT:     Head: Normocephalic and atraumatic.     Right Ear: External ear  normal.     Left Ear: External ear normal.  Eyes:     Extraocular Movements: Extraocular movements intact.     Pupils: Pupils are equal, round, and reactive to light.  Cardiovascular:     Rate and Rhythm: Normal rate and regular rhythm.     Pulses: Normal pulses.     Heart sounds: Normal heart sounds. No murmur heard.   No gallop.  Pulmonary:     Effort: Pulmonary effort is normal. No respiratory distress.     Breath sounds: Decreased breath sounds and wheezing present. No rhonchi or rales.     Comments: (+) wheezing with coughing Abdominal:     General: Bowel sounds are normal. There is no distension.     Palpations: Abdomen is soft. There is no mass.     Tenderness: There is no abdominal tenderness. There is no guarding or rebound.     Hernia: No hernia is present.  Musculoskeletal:     Cervical back: Normal range of motion and neck supple.  Lymphadenopathy:     Cervical: No cervical adenopathy.  Skin:    General: Skin is warm and dry.  Neurological:     Mental Status: She is alert and oriented to person, place, and time.  Psychiatric:        Mood and Affect: Mood normal.        Behavior: Behavior normal.        Thought Content: Thought content normal.        Judgment: Judgment normal.    BP (!) 142/80 (BP Location: Left Arm, Patient Position: Sitting, Cuff Size: Normal)   Pulse 82   Temp 98.5  F (36.9 C) (Oral)   Resp 18   Ht 5' 3.5" (1.613 m)   Wt 124 lb (56.2 kg)   SpO2 96%   BMI 21.62 kg/m  Wt Readings from Last 3 Encounters:  11/01/21 124 lb (56.2 kg)  10/12/21 122 lb 11.2 oz (55.7 kg)  08/23/21 122 lb (55.3 kg)    Diabetic Foot Exam - Simple   No data filed    Lab Results  Component Value Date   WBC 9.0 08/12/2021   HGB 14.7 08/12/2021   HCT 44.4 08/12/2021   PLT 220 08/12/2021   GLUCOSE 75 08/23/2021   CHOL 220 (H) 07/22/2021   TRIG 95.0 07/22/2021   HDL 65.70 07/22/2021   LDLDIRECT 176.2 08/12/2013   LDLCALC 135 (H) 07/22/2021   ALT 16  08/23/2021   AST 22 08/23/2021   NA 140 08/23/2021   K 3.9 08/23/2021   CL 103 08/23/2021   CREATININE 0.91 08/23/2021   BUN 18 08/23/2021   CO2 27 08/23/2021   TSH 1.07 08/04/2017   HGBA1C 5.1 08/04/2017   MICROALBUR 1.1 08/12/2013    Lab Results  Component Value Date   TSH 1.07 08/04/2017   Lab Results  Component Value Date   WBC 9.0 08/12/2021   HGB 14.7 08/12/2021   HCT 44.4 08/12/2021   MCV 91.9 08/12/2021   PLT 220 08/12/2021   Lab Results  Component Value Date   NA 140 08/23/2021   K 3.9 08/23/2021   CO2 27 08/23/2021   GLUCOSE 75 08/23/2021   BUN 18 08/23/2021   CREATININE 0.91 08/23/2021   BILITOT 0.5 08/23/2021   ALKPHOS 50 08/23/2021   AST 22 08/23/2021   ALT 16 08/23/2021   PROT 6.7 08/23/2021   ALBUMIN 4.1 08/23/2021   CALCIUM 9.2 08/23/2021   ANIONGAP 14 08/12/2021   GFR 62.17 08/23/2021   Lab Results  Component Value Date   CHOL 220 (H) 07/22/2021   Lab Results  Component Value Date   HDL 65.70 07/22/2021   Lab Results  Component Value Date   LDLCALC 135 (H) 07/22/2021   Lab Results  Component Value Date   TRIG 95.0 07/22/2021   Lab Results  Component Value Date   CHOLHDL 3 07/22/2021   Lab Results  Component Value Date   HGBA1C 5.1 08/04/2017       Assessment & Plan:   Problem List Items Addressed This Visit   None Visit Diagnoses     Acute cough    -  Primary   Relevant Medications   azithromycin (ZITHROMAX Z-PAK) 250 MG tablet   predniSONE (DELTASONE) 10 MG tablet   promethazine-dextromethorphan (PROMETHAZINE-DM) 6.25-15 MG/5ML syrup   Other Relevant Orders   COVID-19, Flu A+B and RSV (Completed)   DG Chest 2 View   Bronchitis       Relevant Medications   azithromycin (ZITHROMAX Z-PAK) 250 MG tablet   predniSONE (DELTASONE) 10 MG tablet   promethazine-dextromethorphan (PROMETHAZINE-DM) 6.25-15 MG/5ML syrup   Other Relevant Orders   DG Chest 2 View        Meds ordered this encounter  Medications    azithromycin (ZITHROMAX Z-PAK) 250 MG tablet    Sig: As directed    Dispense:  6 each    Refill:  0   predniSONE (DELTASONE) 10 MG tablet    Sig: TAKE 3 TABLETS PO QD FOR 3 DAYS THEN TAKE 2 TABLETS PO QD FOR 3 DAYS THEN TAKE 1 TABLET PO QD FOR 3 DAYS THEN  TAKE 1/2 TAB PO QD FOR 3 DAYS    Dispense:  20 tablet    Refill:  0   promethazine-dextromethorphan (PROMETHAZINE-DM) 6.25-15 MG/5ML syrup    Sig: Take 5 mLs by mouth 4 (four) times daily as needed.    Dispense:  118 mL    Refill:  0    I,Zite Okoli,acting as a scribe for [employer], DO.,have documented all relevant documentation on the behalf of Ann Held, DO,as directed by  Ann Held, DO while in the presence of Ann Held, [place], DO., personally preformed the services described in this documentation.  All medical record entries made by the scribe were at my direction and in my presence.  I have reviewed the chart and discharge instructions (if applicable) and agree that the record reflects my personal performance and is accurate and complete. 11/01/2021

## 2021-11-01 NOTE — Patient Instructions (Signed)
Acute Bronchitis, Adult °Acute bronchitis is sudden inflammation of the main airways (bronchi) that come off the windpipe (trachea) in the lungs. The swelling causes the airways to get smaller and make more mucus than normal. This can make it hard to breathe and can cause coughing or noisy breathing (wheezing). °Acute bronchitis may last several weeks. The cough may last longer. Allergies, asthma, and exposure to smoke may make the condition worse. °What are the causes? °This condition can be caused by germs and by substances that irritate the lungs, including: °Cold and flu viruses. The most common cause of this condition is the virus that causes the common cold. °Bacteria. This is less common. °Breathing in substances that irritate the lungs, including: °Smoke from cigarettes and other forms of tobacco. °Dust and pollen. °Fumes from household cleaning products, gases, or burned fuel. °Indoor or outdoor air pollution. °What increases the risk? °The following factors may make you more likely to develop this condition: °A weak body's defense system, also called the immune system. °A condition that affects your lungs and breathing, such as asthma. °What are the signs or symptoms? °Common symptoms of this condition include: °Coughing. This may bring up clear, yellow, or green mucus from your lungs (sputum). °Wheezing. °Runny or stuffy nose. °Having too much mucus in your lungs (chest congestion). °Shortness of breath. °Aches and pains, including sore throat or chest. °How is this diagnosed? °This condition is usually diagnosed based on: °Your symptoms and medical history. °A physical exam. °You may also have other tests, including tests to rule out other conditions, such as pneumonia. These tests include: °A test of lung function. °Test of a mucus sample to look for the presence of bacteria. °Tests to check the oxygen level in your blood. °Blood tests. °Chest X-ray. °How is this treated? °Most cases of acute bronchitis  clear up over time without treatment. Your health care provider may recommend: °Drinking more fluids to help thin your mucus so it is easier to cough up. °Taking inhaled medicine (inhaler) to improve air flow in and out of your lungs. °Using a vaporizer or a humidifier. These are machines that add water to the air to help you breathe better. °Taking a medicine that thins mucus and clears congestion (expectorant). °Taking a medicine that prevents or stops coughing (cough suppressant). °It is notcommon to take an antibiotic medicine for this condition. °Follow these instructions at home: ° °Take over-the-counter and prescription medicines only as told by your health care provider. °Use an inhaler, vaporizer, or humidifier as told by your health care provider. °Take two teaspoons (10 mL) of honey at bedtime to lessen coughing at night. °Drink enough fluid to keep your urine pale yellow. °Do not use any products that contain nicotine or tobacco. These products include cigarettes, chewing tobacco, and vaping devices, such as e-cigarettes. If you need help quitting, ask your health care provider. °Get plenty of rest. °Return to your normal activities as told by your health care provider. Ask your health care provider what activities are safe for you. °Keep all follow-up visits. This is important. °How is this prevented? °To lower your risk of getting this condition again: °Wash your hands often with soap and water for at least 20 seconds. If soap and water are not available, use hand sanitizer. °Avoid contact with people who have cold symptoms. °Try not to touch your mouth, nose, or eyes with your hands. °Avoid breathing in smoke or chemical fumes. Breathing smoke or chemical fumes will make your condition   worse. °Get the flu shot every year. °Contact a health care provider if: °Your symptoms do not improve after 2 weeks. °You have trouble coughing up the mucus. °Your cough keeps you awake at night. °You have a  fever. °Get help right away if you: °Cough up blood. °Feel pain in your chest. °Have severe shortness of breath. °Faint or keep feeling like you are going to faint. °Have a severe headache. °Have a fever or chills that get worse. °These symptoms may represent a serious problem that is an emergency. Do not wait to see if the symptoms will go away. Get medical help right away. Call your local emergency services (911 in the [place] Do not drive yourself to the hospital. °Summary °Acute bronchitis is inflammation of the main airways (bronchi) that come off the windpipe (trachea) in the lungs. The swelling causes the airways to get smaller and make more mucus than normal. °Drinking more fluids can help thin your mucus so it is easier to cough up. °Take over-the-counter and prescription medicines only as told by your health care provider. °Do not use any products that contain nicotine or tobacco. These products include cigarettes, chewing tobacco, and vaping devices, such as e-cigarettes. If you need help quitting, ask your health care provider. °Contact a health care provider if your symptoms do not improve after 2 weeks. °This information is not intended to replace advice given to you by your health care provider. Make sure you discuss any questions you have with your health care provider. °Document Revised: 03/10/2021 Document Reviewed: 03/10/2021 °Elsevier Patient Education © 2022 [employer] ° °

## 2021-11-03 LAB — COVID-19, FLU A+B AND RSV
Influenza A, NAA: NOT DETECTED
Influenza B, NAA: NOT DETECTED
RSV, NAA: NOT DETECTED
SARS-CoV-2, NAA: NOT DETECTED

## 2021-11-08 ENCOUNTER — Ambulatory Visit: Payer: Medicare PPO | Admitting: Family Medicine

## 2021-11-08 DIAGNOSIS — H903 Sensorineural hearing loss, bilateral: Secondary | ICD-10-CM | POA: Diagnosis not present

## 2021-11-11 ENCOUNTER — Other Ambulatory Visit: Payer: Self-pay | Admitting: Family Medicine

## 2021-11-11 DIAGNOSIS — R051 Acute cough: Secondary | ICD-10-CM

## 2021-11-11 DIAGNOSIS — J40 Bronchitis, not specified as acute or chronic: Secondary | ICD-10-CM

## 2021-11-12 ENCOUNTER — Other Ambulatory Visit: Payer: Self-pay | Admitting: Family Medicine

## 2021-11-12 DIAGNOSIS — J40 Bronchitis, not specified as acute or chronic: Secondary | ICD-10-CM

## 2021-11-12 DIAGNOSIS — R051 Acute cough: Secondary | ICD-10-CM

## 2021-11-12 MED ORDER — AZITHROMYCIN 250 MG PO TABS: ORAL_TABLET | ORAL | 0 refills | Status: DC

## 2021-11-12 MED ORDER — PROMETHAZINE-DM 6.25-15 MG/5ML PO SYRP: 5.0000 mL | ORAL_SOLUTION | Freq: Four times a day (QID) | ORAL | 0 refills | Status: DC | PRN

## 2021-11-12 NOTE — Telephone Encounter (Signed)
Pt requesting refill on promethazine syrup. Last refilled: 153ml and 0rf

## 2021-11-12 NOTE — Telephone Encounter (Signed)
Patient comment: Dr. Etter Sjogren, I thought I was better and I cancelled my follow up appt. with you because I had another appt. with my hearing doctor at the same time. Today I am still coughing. I still have my Prednisone to finish but I may need another round of antibiotics. I don't want to feel bad this weekend. Thank you.

## 2021-11-17 ENCOUNTER — Encounter: Payer: Medicare PPO | Admitting: Nurse Practitioner

## 2021-11-19 ENCOUNTER — Telehealth: Payer: Self-pay | Admitting: Family Medicine

## 2021-11-19 DIAGNOSIS — J40 Bronchitis, not specified as acute or chronic: Secondary | ICD-10-CM

## 2021-11-19 DIAGNOSIS — R051 Acute cough: Secondary | ICD-10-CM

## 2021-11-19 MED ORDER — PROMETHAZINE-DM 6.25-15 MG/5ML PO SYRP: ORAL_SOLUTION | ORAL | 0 refills | Status: DC

## 2021-11-19 NOTE — Telephone Encounter (Signed)
Attempted to call patient on both numbers. Cell VM was full and home number went to a automated system?

## 2021-11-19 NOTE — Telephone Encounter (Signed)
I refilled ths cough syrup. No need for more antibiotics unless she has fever chills or feeling worse (will need to be seen if that is the case)

## 2021-11-19 NOTE — Telephone Encounter (Signed)
DOD:  Pt seen by Lowne on 12/12 and has a visit scheduled with Mickel Baas on 01/03. Pt has completed antibiotic. Okay to refill cough med?

## 2021-11-19 NOTE — Telephone Encounter (Signed)
Spoke with patient. Pt verbalized understanding  °

## 2021-11-19 NOTE — Telephone Encounter (Signed)
Pt called and stated she is feeling better but not completely better. She said the cough syrup helps but is about to run out. She is out of antibiotics and wants to see does she need more to completely clear this up. I scheduled her for an appt on 1/3 with Samantha Miranda but she wants to see if there is anything she can do in the mean time.

## 2021-11-23 ENCOUNTER — Encounter: Payer: Self-pay | Admitting: Family

## 2021-11-23 ENCOUNTER — Other Ambulatory Visit: Payer: Self-pay

## 2021-11-23 ENCOUNTER — Ambulatory Visit: Payer: Medicare PPO | Admitting: Family

## 2021-11-23 ENCOUNTER — Ambulatory Visit (HOSPITAL_BASED_OUTPATIENT_CLINIC_OR_DEPARTMENT_OTHER)
Admission: RE | Admit: 2021-11-23 | Discharge: 2021-11-23 | Disposition: A | Discharge status: Home / Self Care | Payer: Medicare PPO | Source: Ambulatory Visit | Attending: Family | Admitting: Family

## 2021-11-23 VITALS — BP 110/50 | HR 82 | Temp 97.9°F | Ht 62.0 in | Wt 125.0 lb

## 2021-11-23 DIAGNOSIS — R053 Chronic cough: Secondary | ICD-10-CM

## 2021-11-23 DIAGNOSIS — K148 Other diseases of tongue: Secondary | ICD-10-CM | POA: Diagnosis not present

## 2021-11-23 DIAGNOSIS — R059 Cough, unspecified: Secondary | ICD-10-CM | POA: Diagnosis not present

## 2021-11-23 MED ORDER — PREDNISONE 20 MG PO TABS: 20.0000 mg | ORAL_TABLET | Freq: Every day | ORAL | 0 refills | Status: DC

## 2021-11-23 MED ORDER — DOXYCYCLINE HYCLATE 100 MG PO TABS: 100.0000 mg | ORAL_TABLET | Freq: Two times a day (BID) | ORAL | 0 refills | Status: DC

## 2021-11-23 NOTE — Progress Notes (Signed)
Samantha Miranda is a 76 y.o. 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   Influenza vaccine administered 11/02/2015   Otitis media 08/10/2015   Acute bronchitis 04/21/2015   Right ankle pain 02/10/2014   Left shoulder pain 11/22/2013   Right shoulder pain 08/20/2013   Near syncope 08/12/2013   Other malaise and fatigue 08/12/2013   Rectal itching 07/01/2013   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   Gastroesophageal reflux disease 08/12/2012   Former heavy tobacco smoker 08/26/2010   COLONIC POLYPS, HX OF 08/26/2010   POSTMENOPAUSAL STATUS 08/26/2010   POSTMENOPAUSAL BLEEDING 04/13/2009   Hyperlipidemia 07/11/2007   DEPRESSION 07/11/2007   OSTEOPENIA 07/11/2007   BREAST CANCER, HX OF 07/11/2007    Current Outpatient Medications  Medication Sig Dispense Refill   Biotin w/ Vitamins C & E (HAIR SKIN & NAILS GUMMIES PO) Take 2 each by mouth daily.     Calcium-Magnesium-Zinc (CAL-MAG-ZINC PO) Take 2 tablets by mouth daily.     cholecalciferol (VITAMIN D3) 25 MCG (1000 UNIT) tablet Take 1,000 Units by mouth daily.     doxycycline (VIBRA-TABS) 100 MG tablet Take 1 tablet (100 mg total) by mouth 2 (two) times daily. 14 tablet 0   ezetimibe (ZETIA) 10 MG tablet TAKE ONE TABLET BY MOUTH ONE TIME DAILY 90 tablet 1   fluticasone (FLONASE) 50 MCG/ACT nasal spray Place 2 sprays into both nostrils daily. (Patient taking differently: Place 2 sprays into both nostrils daily as needed for allergies.) 16 g 5   hydrocortisone (ANUSOL-HC) 2.5 % rectal cream Place 1 application rectally 2 (two) times daily as needed for hemorrhoids or anal itching. 30 g 1   ondansetron (ZOFRAN  ODT) 4 MG disintegrating tablet Take 1 tablet (4 mg total) by mouth every 8 (eight) hours as needed for nausea or vomiting. 20 tablet 0   pantoprazole (PROTONIX) 40 MG tablet Take 1 tablet (40 mg total) by mouth 2 (two) times daily. 180 tablet 3   predniSONE (DELTASONE) 20 MG tablet Take 1 tablet (20 mg total) by mouth daily with breakfast. 5 tablet 0   promethazine-dextromethorphan (PROMETHAZINE-DM) 6.25-15 MG/5ML syrup TAKE FIVE ML BY MOUTH FOUR TIMES A DAY AS NEEDED 118 mL 0   vitamin C (ASCORBIC ACID) 250 MG tablet Take 250 mg by mouth daily.     XIIDRA 5 % SOLN Place 1 drop into both eyes in the morning and at bedtime.  1   zolpidem (AMBIEN) 5 MG tablet Take 1 tablet (5 mg total) by mouth at bedtime as needed for sleep. 30 tablet 1   COVID-19 mRNA bivalent vaccine, Pfizer, injection Inject into the muscle. (Patient not taking: Reported on 11/23/2021) 0.3 mL 0   FLUoxetine (PROZAC) 10 MG tablet TAKE ONE TABLET BY MOUTH ONE TIME DAILY (Patient not taking: Reported on 11/23/2021) 90 tablet 1   gabapentin (NEURONTIN) 100 MG capsule Take 1 capsule (100 mg total) by mouth at bedtime. May gradually increase to 3 capsules (300 mg total) at night if tolerable (Patient not taking: Reported on 11/23/2021) 90 capsule 0   Misc Natural Products (CHOLESTEROL SUPPORT PO) Take 2 capsules by mouth daily. (Patient not taking: Reported on 11/23/2021)  Omega-3 Fatty Acids (FISH OIL) 1200 MG CAPS Take 1,200 mg by mouth daily. (Patient not taking: Reported on 11/23/2021)     No current facility-administered medications for this visit.    Allergies: Amoxicillin-pot clavulanate and Moxifloxacin  Past Medical History:  Diagnosis Date   Barrett's esophagus    Basal cell carcinoma of left lower leg    Breast cancer (HCC)    right   COPD (chronic obstructive pulmonary disease) ([place]) 2018   Depression 1989   Gastric ulcer    GERD (gastroesophageal reflux disease)    History of colon polyps    Hyperlipidemia     Osteopenia     Past Surgical History:  Procedure Laterality Date   ABDOMINAL HYSTERECTOMY  11/21/1989   APPENDECTOMY  1969   BREAST LUMPECTOMY  07/22/2006   BREAST LUMPECTOMY WITH RADIOACTIVE SEED AND SENTINEL LYMPH NODE BIOPSY Right 07/02/2021   Procedure: RIGHT BREAST LUMPECTOMY WITH RADIOACTIVE SEED AND RIGHT AXILLARY SENTINEL LYMPH NODE BIOPSY;  Surgeon: Rolm Bookbinder, MD;  Location: [facility];  Service: General;  Laterality: Right;   CATARACT EXTRACTION, BILATERAL     EYE SURGERY  08/2013  10/2013   cataracts   TONSILLECTOMY      Family History  Problem Relation Age of Onset   Heart failure Mother        CHF   Hyperlipidemia Mother    Aortic stenosis Mother    Arthritis Mother    Heart disease Mother    Lung cancer Father 61   Cancer Father    Early death Father    Breast cancer Sister 33   Cancer Sister    Depression Sister    Stroke Maternal Aunt    Varicose Veins Maternal Aunt    Diabetes Maternal Grandmother    Hyperlipidemia Maternal Grandmother    Colon cancer Maternal Grandfather    Cancer Maternal Grandfather        colon cancer   Cancer Paternal Grandfather        unknown type cancer   Drug abuse Daughter    Heart attack Neg Hx    Hypertension Neg Hx    Sudden death Neg Hx    Esophageal cancer Neg Hx    Stomach cancer Neg Hx    Rectal cancer Neg Hx     Social History   Tobacco Use   Smoking status: Former    Packs/day: 0.50    Years: 40.00    Pack years: 20.00    Types: Cigarettes, E-cigarettes    Quit date: 07/23/2011    Years since quitting: 10.3   Smokeless tobacco: Never   Tobacco comments:    The last 5 yrs. I smoked .5 pack a day. Before that, 1 pk. a day  Substance Use Topics   Alcohol use: No    Comment: occasional wine    Subjective:  Patient was seen with acute cough/ suspected bronchitis in mid December 2022; was treated with Z-pak and prednisone; notes that she is feeling better but still not 100%;  + productive cough; no fever  or chest pain or shortness of breath;   Also notes that she is worried about "dark color" of tongue which has been present for 2 months; is up to date on dental exams; notes that discoloration was present prior to recent use of antibiotics/ prednisone;     Objective:  Vitals:   11/23/21 1329  BP: (!) 110/50  Pulse: 82  Temp: 97.9 F (36.6 C)  TempSrc: Oral  SpO2:  95%  Weight: 125 lb (56.7 kg)  Height: 5\' 2"  (1.575 m)    General: Well developed, well nourished, in no acute distress  Skin : Warm and dry.  Head: Normocephalic and atraumatic  Eyes: Sclera and conjunctiva clear; pupils round and reactive to light; extraocular movements intact  Ears: External normal; canals clear; tympanic membranes normal  Oropharynx: Pink, supple. No suspicious lesions  Neck: Supple without thyromegaly, adenopathy  Lungs: Respirations unlabored; clear to auscultation bilaterally without wheeze, rales, rhonchi  CVS exam: normal rate and regular rhythm.  Neurologic: Alert and oriented; speech intact; face symmetrical; moves all extremities well; CNII-XII intact without focal deficit   Assessment:  1. Persistent cough for 3 weeks or longer   2. Tongue discoloration     Plan:  Update CXR today; re-treat with Doxycycline 100 mg bid x 7 days and Prednisone 20 mg qd x 5 days; increase fluids, rest and follow up worse, no better.  Encouraged to follow up with her dentist for further evaluation;   This visit occurred during the SARS-CoV-2 public health emergency.  Safety protocols were in place, including screening questions prior to the visit, additional usage of staff PPE, and extensive cleaning of exam room while observing appropriate contact time as indicated for disinfecting solutions.    No follow-ups on file.  Orders Placed This Encounter  Procedures   DG Chest 2 View    Standing Status:   Future    Number of Occurrences:   1    Standing Expiration Date:   11/23/2022    Order Specific Question:    Reason for Exam (SYMPTOM  OR DIAGNOSIS REQUIRED)    Answer:   persistent cough    Order Specific Question:   Preferred imaging location?    Answer:   [facility]    Requested Prescriptions   Signed Prescriptions Disp Refills   doxycycline (VIBRA-TABS) 100 MG tablet 14 tablet 0    Sig: Take 1 tablet (100 mg total) by mouth 2 (two) times daily.   predniSONE (DELTASONE) 20 MG tablet 5 tablet 0    Sig: Take 1 tablet (20 mg total) by mouth daily with breakfast.

## 2021-11-26 ENCOUNTER — Telehealth: Payer: Self-pay | Admitting: *Deleted

## 2021-12-02 ENCOUNTER — Encounter: Payer: Medicare PPO | Admitting: Nurse Practitioner

## 2021-12-06 ENCOUNTER — Ambulatory Visit: Payer: Medicare PPO | Admitting: Podiatry

## 2021-12-14 ENCOUNTER — Other Ambulatory Visit: Payer: Self-pay

## 2021-12-14 ENCOUNTER — Ambulatory Visit: Payer: Medicare PPO | Admitting: Podiatry

## 2021-12-14 ENCOUNTER — Ambulatory Visit (INDEPENDENT_AMBULATORY_CARE_PROVIDER_SITE_OTHER): Payer: Medicare PPO

## 2021-12-14 DIAGNOSIS — M21611 Bunion of right foot: Secondary | ICD-10-CM | POA: Diagnosis not present

## 2021-12-14 DIAGNOSIS — M21612 Bunion of left foot: Secondary | ICD-10-CM

## 2021-12-14 DIAGNOSIS — B351 Tinea unguium: Secondary | ICD-10-CM | POA: Diagnosis not present

## 2021-12-14 DIAGNOSIS — E559 Vitamin D deficiency, unspecified: Secondary | ICD-10-CM | POA: Diagnosis not present

## 2021-12-14 DIAGNOSIS — Z01818 Encounter for other preprocedural examination: Secondary | ICD-10-CM

## 2021-12-14 MED ORDER — EFINACONAZOLE 10 % EX SOLN
1.0000 [drp] | Freq: Every day | CUTANEOUS | 11 refills | Status: DC
Start: 2021-12-14 — End: 2023-05-15

## 2021-12-14 NOTE — Patient Instructions (Addendum)
Pre-Operative Instructions  Congratulations, you have decided to take an important step to improving your quality of life.  You can be assured that the doctors of [facility] will be with you every step of the way.  Plan to be at the surgery center/hospital at least 1 (one) hour prior to your scheduled time unless otherwise directed by the surgical center/hospital staff.  You must have a responsible adult accompany you, remain during the surgery and drive you home.  Make sure you have directions to the surgical center/hospital and know how to get there on time. For hospital based surgery you will need to obtain a history and physical form from your family physician within 1 month prior to the date of surgery- we will give you a form for you primary physician.  We make every effort to accommodate the date you request for surgery.  There are however, times where surgery dates or times have to be moved.  We will contact you as soon as possible if a change in schedule is required.   No Aspirin/Ibuprofen for one week before surgery.  If you are on aspirin, any non-steroidal anti-inflammatory medications (Mobic, Aleve, Ibuprofen) you should stop taking it 7 days prior to your surgery.  You make take Tylenol  For pain prior to surgery.  Medications- If you are taking daily heart and blood pressure medications, seizure, reflux, allergy, asthma, anxiety, pain or diabetes medications, make sure the surgery center/hospital is aware before the day of surgery so they may notify you which medications to take or avoid the day of surgery. No food or drink after midnight the night before surgery unless directed otherwise by surgical center/hospital staff. No alcoholic beverages 24 hours prior to surgery.  No smoking 24 hours prior to or 24 hours after surgery. Wear loose pants or shorts- loose enough to fit over bandages, boots, and casts. No slip on shoes, sneakers are best. Bring your boot with you to the  surgery center/hospital.  Also bring crutches or a walker if your physician has prescribed it for you.  If you do not have this equipment, it will be provided for you after surgery. If you have not been contracted by the surgery center/hospital by the day before your surgery, call to confirm the date and time of your surgery. Leave-time from work may vary depending on the type of surgery you have.  Appropriate arrangements should be made prior to surgery with your employer. Prescriptions will be provided immediately following surgery by your doctor.  Have these filled as soon as possible after surgery and take the medication as directed. Remove nail polish on the operative foot. Wash the night before surgery.  The night before surgery wash the foot and leg well with the antibacterial soap provided and water paying special attention to beneath the toenails and in between the toes.  Rinse thoroughly with water and dry well with a towel.  Perform this wash unless told not to do so by your physician.  Enclosed: 1 Ice pack (please put in freezer the night before surgery)   1 Hibiclens skin cleaner   Pre-op Instructions  If you have any questions regarding the instructions, do not hesitate to call our office at any point during this process.   [place]: [place], [place] [address] [place]: [address] [place], [place] [address] [phone]  Dr. Celesta Gentile, DPM   Efinaconazole Topical Solution What is this medication? EFINACONAZOLE (e FEE na KON a zole)  is an antifungal medicine. It is used to treat certain kinds of fungal infections of the toenail. This medicine may be used for other purposes; ask your health care provider or pharmacist if you have questions. COMMON BRAND NAME(S): JUBLIA What should I tell my care team before I take this medication? They need to know if you have any of these conditions: an unusual or allergic reaction to efinaconazole,  other medicines, foods, dyes or preservatives pregnant or trying to get pregnant breast-feeding How should I use this medication? This medicine is for external use only. Do not take by mouth. Follow the directions on the label. Wash hands before and after use. Apply this medicine using the provided brush to cover the entire toenail. Do not use your medicine more often than directed. Finish the full course prescribed by your doctor or health care professional even if you think your condition is better. Do not stop using except on the advice of your doctor or health care professional. Talk to your pediatrician regarding the use of this medicine in children. While this drug may be prescribed for children as young as 6 years for selected conditions, precautions do apply. Overdosage: If you think you have taken too much of this medicine contact a poison control center or emergency room at once. NOTE: This medicine is only for you. Do not share this medicine with others. What if I miss a dose? If you miss a dose, use it as soon as you can. If it is almost time for your next dose, use only that dose. Do not use double or extra doses. What may interact with this medication? Interactions have not been studied. Do not use any other nail products (i.e., nail polish, pedicures) during treatment with this medicine. This list may not describe all possible interactions. Give your health care provider a list of all the medicines, herbs, non-prescription drugs, or dietary supplements you use. Also tell them if you smoke, drink alcohol, or use illegal drugs. Some items may interact with your medicine. What should I watch for while using this medication? Do not get this medicine in your eyes. If you do, rinse out with plenty of cool tap water. Tell your doctor or health care professional if your symptoms do not start to get better or if they get worse. Wait for at least 10 minutes after bathing before applying this  medication. After bathing, make sure that your feet are very dry. Fungal infections like moist conditions. Do not walk around barefoot. To help prevent reinfection, wear freshly washed cotton, not synthetic clothing. Tell your doctor or health care professional if you develop sores or blisters that do not heal properly. If your nail infection returns after you stop using this medicine, contact your doctor or health care professional. What side effects may I notice from receiving this medication? Side effects that you should report to your doctor or health care professional as soon as possible: allergic reactions like skin rash, itching or hives, swelling of the face, lips, or tongue ingrown toenail Side effects that usually do not require medical attention (report to your doctor or health care professional if they continue or are bothersome): mild skin irritation, burning, or itching This list may not describe all possible side effects. Call your doctor for medical advice about side effects. You may report side effects to FDA at 1-800-FDA-1088. Where should I keep my medication? Keep out of the reach of children. Store at room temperature between 20 and 25 degrees C (  68 and 77 degrees F). Keep this medicine in the original container. Throw away any unused medicine after the expiration date. This medicine is flammable. Avoid exposure to heat, fire, flame, and smoking. NOTE: This sheet is a summary. It may not cover all possible information. If you have questions about this medicine, talk to your doctor, pharmacist, or health care provider.  2022 Elsevier/Gold Standard (2019-03-20 00:00:00)

## 2021-12-15 ENCOUNTER — Telehealth: Payer: Self-pay | Admitting: *Deleted

## 2021-12-15 LAB — BASIC METABOLIC PANEL
BUN: 18 mg/dL (ref 7–25)
CO2: 33 mmol/L — ABNORMAL HIGH (ref 20–32)
Calcium: 9.4 mg/dL (ref 8.6–10.4)
Chloride: 103 mmol/L (ref 98–110)
Creat: 1 mg/dL (ref 0.60–1.00)
Glucose, Bld: 73 mg/dL (ref 65–99)
Potassium: 3.9 mmol/L (ref 3.5–5.3)
Sodium: 140 mmol/L (ref 135–146)

## 2021-12-15 LAB — CBC WITH DIFFERENTIAL/PLATELET
Absolute Monocytes: 442 cells/uL (ref 200–950)
Basophils Absolute: 60 cells/uL (ref 0–200)
Basophils Relative: 0.9 %
Eosinophils Absolute: 208 cells/uL (ref 15–500)
Eosinophils Relative: 3.1 %
HCT: 42.5 % (ref 35.0–45.0)
Hemoglobin: 14.3 g/dL (ref 11.7–15.5)
Lymphs Abs: 1889 cells/uL (ref 850–3900)
MCH: 30.9 pg (ref 27.0–33.0)
MCHC: 33.6 g/dL (ref 32.0–36.0)
MCV: 91.8 fL (ref 80.0–100.0)
MPV: 10.8 fL (ref 7.5–12.5)
Monocytes Relative: 6.6 %
Neutro Abs: 4100 cells/uL (ref 1500–7800)
Neutrophils Relative %: 61.2 %
Platelets: 204 10*3/uL (ref 140–400)
RBC: 4.63 10*6/uL (ref 3.80–5.10)
RDW: 12.3 % (ref 11.0–15.0)
Total Lymphocyte: 28.2 %
WBC: 6.7 10*3/uL (ref 3.8–10.8)

## 2021-12-15 LAB — VITAMIN D 25 HYDROXY (VIT D DEFICIENCY, FRACTURES): Vit D, 25-Hydroxy: 65 ng/mL (ref 30–100)

## 2021-12-15 NOTE — Telephone Encounter (Signed)
Called and gave information to patient,verbalized understanding.

## 2021-12-15 NOTE — Telephone Encounter (Signed)
Patient is calling for a vitamin D code requested by [facility] for insurance purposes. Called [facility] and they said that everything has been finalized and will fax over the results today.there was nothing notated in her chart of this request. Notified patient thru vmessage.

## 2021-12-16 ENCOUNTER — Telehealth: Payer: Self-pay | Admitting: Urology

## 2021-12-16 NOTE — Telephone Encounter (Signed)
DOS - 01/05/22  HALLUX MPJ FUSION RIGHT --- [id number] STEROID INJECTION LEFT --- [id number]  [employer] EFFECTIVE DATE -  11/22/19   PER [facility] WEBSITE FOR CPT CODE 71252 NO PRIOR AUTH IS REQUIRED, FOR CPT CODE 71292 HAS BEEN APPROVED, AUTH # [id number], GOOD FROM 01/05/22 - 04/05/22.  TRACKING # [id number]

## 2021-12-20 ENCOUNTER — Ambulatory Visit: Payer: Medicare PPO | Admitting: Family Medicine

## 2021-12-20 ENCOUNTER — Encounter: Payer: Self-pay | Admitting: Family Medicine

## 2021-12-20 VITALS — BP 118/80 | HR 98 | Temp 97.7°F | Resp 18 | Ht 62.0 in | Wt 122.4 lb

## 2021-12-20 DIAGNOSIS — J069 Acute upper respiratory infection, unspecified: Secondary | ICD-10-CM

## 2021-12-20 DIAGNOSIS — F324 Major depressive disorder, single episode, in partial remission: Secondary | ICD-10-CM

## 2021-12-20 DIAGNOSIS — R053 Chronic cough: Secondary | ICD-10-CM | POA: Diagnosis not present

## 2021-12-20 DIAGNOSIS — I700 Atherosclerosis of aorta: Secondary | ICD-10-CM | POA: Diagnosis not present

## 2021-12-20 DIAGNOSIS — R0609 Other forms of dyspnea: Secondary | ICD-10-CM | POA: Diagnosis not present

## 2021-12-20 DIAGNOSIS — R0602 Shortness of breath: Secondary | ICD-10-CM | POA: Insufficient documentation

## 2021-12-20 DIAGNOSIS — E785 Hyperlipidemia, unspecified: Secondary | ICD-10-CM | POA: Diagnosis not present

## 2021-12-20 DIAGNOSIS — C50111 Malignant neoplasm of central portion of right female breast: Secondary | ICD-10-CM | POA: Diagnosis not present

## 2021-12-20 DIAGNOSIS — J439 Emphysema, unspecified: Secondary | ICD-10-CM | POA: Insufficient documentation

## 2021-12-20 DIAGNOSIS — Z170 Estrogen receptor positive status [ER+]: Secondary | ICD-10-CM | POA: Diagnosis not present

## 2021-12-20 MED ORDER — FLUTICASONE-SALMETEROL 100-50 MCG/ACT IN AEPB: 1.0000 | INHALATION_SPRAY | Freq: Two times a day (BID) | RESPIRATORY_TRACT | 3 refills | Status: DC

## 2021-12-20 MED ORDER — LORATADINE 10 MG PO TABS: 10.0000 mg | ORAL_TABLET | Freq: Every day | ORAL | 11 refills | Status: AC

## 2021-12-20 NOTE — Progress Notes (Signed)
Subjective: 76 year old female presents the office today for concerns of pain to her right foot and discuss surgery.  She was last seen by Dr. Paulla Dolly in 2021.  She has tried shoe modifications, offloading without significant improvement and she wants to discuss surgical invention this time.  She also has pain on the bottom of her left big toe which is been ongoing for some time causing discomfort.  She is notes a small area of swelling is asking if the area can be drained to help with her pain.  Also asking about toenail fungus treatment.  No pain in the toenail.  Objective: AAO x3, NAD DP/PT pulses palpable bilaterally, CRT less than 3 seconds Nails appear to be hypertrophic, dystrophic yellow-brown discoloration of the left hallux toenail.  No pain to the nail no swelling redness or drainage. Severe bunions present right side worse than left with overlapping toes present.  Majority discomfort for the first and second toes are rubbing causing the discomfort.  The hallux is on top of the second digit on the right foot.   Left foot there is a small area, soft tissue mass on the plantar aspect of the first MPJ.  There is no fluid identified incision there is no fluctuation or crepitation.  Likely small bursa.  No pain with calf compression, swelling, warmth, erythema  Assessment: Right foot symptomatic bunion, left foot likely bursa  Plan: -All treatment options discussed with the patient including all alternatives, risks, complications.  -X-rays obtained reviewed.  Bunions worse on the right foot compared to left.  There is a significant increase in intermetatarsal angle with mild arthritis of the first MPJ. -Regards to the right foot we discussed the conservative as well as surgical options.  She is tried shoe modifications, offloading pads without any significant improvement.  She was proceed with surgical intervention.  Given the amount of crossing over the digits discussed with her first MPJ  arthrodesis versus more traditional bunion surgery.  She agrees with first degree arthrodesis.  We will plan on surgery in the right foot as well as care injection for the left foot. -The incision placement as well as the postoperative course was discussed with the patient. I discussed risks of the surgery which include, but not limited to, infection, bleeding, pain, swelling, need for further surgery, delayed or nonhealing, painful or ugly scar, numbness or sensation changes, over/under correction, recurrence, transfer lesions, further deformity, hardware failure, DVT/PE, loss of toe/foot. Patient understands these risks and wishes to proceed with surgery. The surgical consent was reviewed with the patient all 3 pages were signed. No promises or guarantees were given to the outcome of the procedure. All questions were answered to the best of my ability. Before the surgery the patient was encouraged to call the office if there is any further questions. The surgery will be performed at the [facility] on an outpatient basis. -Blood work ordered for preoperative evaluation. Imelda Pillow for nail fungus -Patient encouraged to call the office with any questions, concerns, change in symptoms.   35 minutes were spent with the patient greater than 50% of time was in face-to-face contact discussing surgery options as well as the surgery.  Trula Slade DPM

## 2021-12-20 NOTE — Assessment & Plan Note (Signed)
Encourage heart healthy diet such as MIND or DASH diet, increase exercise, avoid trans fats, simple carbohydrates and processed foods, consider a krill or fish or flaxseed oil cap daily.  °

## 2021-12-20 NOTE — Assessment & Plan Note (Signed)
Xray normal  Ct -- + emphysema advair sent in  Check echo ekg--- RBBB

## 2021-12-20 NOTE — Assessment & Plan Note (Signed)
Cholesterol control important Check labs today

## 2021-12-20 NOTE — Progress Notes (Signed)
Established Patient Office Visit  Subjective:  Patient ID: Samantha Miranda, female    DOB: 1946-11-10  Age: 76 y.o. MRN: [id number]  CC:  Chief Complaint  Patient presents with   Hyperlipidemia   Follow-up    HPI Samantha Miranda presents for f/u cholesterol and c/o cont cough , dry.  This causes chest tightness / heaviness and sob with exertion.       Past Medical History:  Diagnosis Date   Barrett's esophagus    Basal cell carcinoma of left lower leg    Breast cancer (HCC)    right   COPD (chronic obstructive pulmonary disease) ([place]) 2018   Depression 1989   Gastric ulcer    GERD (gastroesophageal reflux disease)    History of colon polyps    Hyperlipidemia    Osteopenia     Past Surgical History:  Procedure Laterality Date   ABDOMINAL HYSTERECTOMY  11/21/1989   APPENDECTOMY  1969   BREAST LUMPECTOMY  07/22/2006   BREAST LUMPECTOMY WITH RADIOACTIVE SEED AND SENTINEL LYMPH NODE BIOPSY Right 07/02/2021   Procedure: RIGHT BREAST LUMPECTOMY WITH RADIOACTIVE SEED AND RIGHT AXILLARY SENTINEL LYMPH NODE BIOPSY;  Surgeon: Rolm Bookbinder, MD;  Location: [facility];  Service: General;  Laterality: Right;   CATARACT EXTRACTION, BILATERAL     EYE SURGERY  08/2013  10/2013   cataracts   TONSILLECTOMY      Family History  Problem Relation Age of Onset   Heart failure Mother        CHF   Hyperlipidemia Mother    Aortic stenosis Mother    Arthritis Mother    Heart disease Mother    Lung cancer Father 70   Cancer Father    Early death Father    Breast cancer Sister 90   Cancer Sister    Depression Sister    Stroke Maternal Aunt    Varicose Veins Maternal Aunt    Diabetes Maternal Grandmother    Hyperlipidemia Maternal Grandmother    Colon cancer Maternal Grandfather    Cancer Maternal Grandfather        colon cancer   Cancer Paternal Grandfather        unknown type cancer   Drug abuse Daughter    Heart attack Neg Hx    Hypertension Neg Hx    Sudden death Neg Hx     Esophageal cancer Neg Hx    Stomach cancer Neg Hx    Rectal cancer Neg Hx     Social History   Socioeconomic History   Marital status: Married    Spouse name: Not on file   Number of children: 1   Years of education: Not on file   Highest education level: Not on file  Occupational History   Occupation: retired    Fish farm manager: [employer]    Comment: retired  Tobacco Use   Smoking status: Former    Packs/day: 0.50    Years: 40.00    Pack years: 20.00    Types: Cigarettes, E-cigarettes    Quit date: 07/23/2011    Years since quitting: 10.4   Smokeless tobacco: Never   Tobacco comments:    The last 5 yrs. I smoked .5 pack a day. Before that, 1 pk. a day  Vaping Use   Vaping Use: Former   Substances: Nicotine  Substance and Sexual Activity   Alcohol use: No    Comment: occasional wine   Drug use: No   Sexual activity: Not  Currently    Partners: Male    Birth control/protection: Post-menopausal  Other Topics Concern   Not on file  Social History Narrative   Exercise-no   Social Determinants of Health   Financial Resource Strain: Low Risk    Difficulty of Paying Living Expenses: Not hard at all  Food Insecurity: No Food Insecurity   Worried About Charity fundraiser in the Last Year: Never true   Arboriculturist in the Last Year: Never true  Transportation Needs: No Transportation Needs   Lack of Transportation (Medical): No   Lack of Transportation (Non-Medical): No  Physical Activity: Sufficiently Active   Days of Exercise per Week: 6 days   Minutes of Exercise per Session: 30 min  Stress: No Stress Concern Present   Feeling of Stress : Not at all  Social Connections: Moderately Isolated   Frequency of Communication with Friends and Family: More than three times a week   Frequency of Social Gatherings with Friends and Family: More than three times a week   Attends Religious Services: Never   Marine scientist or Organizations: No   Attends Arts development officer: Never   Marital Status: Married  Human resources officer Violence: Not At Risk   Fear of Current or Ex-Partner: No   Emotionally Abused: No   Physically Abused: No   Sexually Abused: No    Outpatient Medications Prior to Visit  Medication Sig Dispense Refill   Biotin w/ Vitamins C & E (HAIR SKIN & NAILS GUMMIES PO) Take 2 each by mouth daily.     Calcium-Magnesium-Zinc (CAL-MAG-ZINC PO) Take 2 tablets by mouth daily.     cholecalciferol (VITAMIN D3) 25 MCG (1000 UNIT) tablet Take 1,000 Units by mouth daily.     COVID-19 mRNA bivalent vaccine, Pfizer, injection Inject into the muscle. 0.3 mL 0   Efinaconazole 10 % SOLN Apply 1 drop topically daily. 4 mL 11   ezetimibe (ZETIA) 10 MG tablet TAKE ONE TABLET BY MOUTH ONE TIME DAILY 90 tablet 1   FLUoxetine (PROZAC) 10 MG tablet TAKE ONE TABLET BY MOUTH ONE TIME DAILY 90 tablet 1   fluticasone (FLONASE) 50 MCG/ACT nasal spray Place 2 sprays into both nostrils daily. (Patient taking differently: Place 2 sprays into both nostrils daily as needed for allergies.) 16 g 5   hydrocortisone (ANUSOL-HC) 2.5 % rectal cream Place 1 application rectally 2 (two) times daily as needed for hemorrhoids or anal itching. 30 g 1   Omega-3 Fatty Acids (FISH OIL) 1200 MG CAPS Take 1,200 mg by mouth daily.     ondansetron (ZOFRAN ODT) 4 MG disintegrating tablet Take 1 tablet (4 mg total) by mouth every 8 (eight) hours as needed for nausea or vomiting. 20 tablet 0   pantoprazole (PROTONIX) 40 MG tablet Take 1 tablet (40 mg total) by mouth 2 (two) times daily. 180 tablet 3   promethazine-dextromethorphan (PROMETHAZINE-DM) 6.25-15 MG/5ML syrup TAKE FIVE ML BY MOUTH FOUR TIMES A DAY AS NEEDED 118 mL 0   vitamin C (ASCORBIC ACID) 250 MG tablet Take 250 mg by mouth daily.     XIIDRA 5 % SOLN Place 1 drop into both eyes in the morning and at bedtime.  1   zolpidem (AMBIEN) 5 MG tablet Take 1 tablet (5 mg total) by mouth at bedtime as needed for sleep. 30  tablet 1   doxycycline (VIBRA-TABS) 100 MG tablet Take 1 tablet (100 mg total) by mouth 2 (two) times daily. [place]  tablet 0   gabapentin (NEURONTIN) 100 MG capsule Take 1 capsule (100 mg total) by mouth at bedtime. May gradually increase to 3 capsules (300 mg total) at night if tolerable (Patient not taking: Reported on 11/23/2021) 90 capsule 0   Misc Natural Products (CHOLESTEROL SUPPORT PO) Take 2 capsules by mouth daily. (Patient not taking: Reported on 11/23/2021)     predniSONE (DELTASONE) 20 MG tablet Take 1 tablet (20 mg total) by mouth daily with breakfast. 5 tablet 0   No facility-administered medications prior to visit.    Allergies  Allergen Reactions   Amoxicillin-Pot Clavulanate Nausea And Vomiting   Moxifloxacin Nausea And Vomiting    ROS Review of Systems  Constitutional:  Negative for appetite change, diaphoresis, fatigue and unexpected weight change.  Eyes:  Negative for pain, redness and visual disturbance.  Respiratory:  Negative for cough, chest tightness, shortness of breath and wheezing.   Cardiovascular:  Negative for chest pain, palpitations and leg swelling.  Endocrine: Negative for cold intolerance, heat intolerance, polydipsia, polyphagia and polyuria.  Genitourinary:  Negative for difficulty urinating, dysuria and frequency.  Neurological:  Negative for dizziness, light-headedness, numbness and headaches.     Objective:    Physical Exam Vitals and nursing note reviewed.  Constitutional:      Appearance: She is well-developed.  HENT:     Head: Normocephalic and atraumatic.  Eyes:     Conjunctiva/sclera: Conjunctivae normal.  Neck:     Thyroid: No thyromegaly.     Vascular: No carotid bruit or JVD.  Cardiovascular:     Rate and Rhythm: Normal rate and regular rhythm.     Heart sounds: Normal heart sounds. No murmur heard. Pulmonary:     Effort: Pulmonary effort is normal. No respiratory distress.     Breath sounds: Wheezing present. No rales.  Chest:      Chest wall: No tenderness.  Musculoskeletal:     Cervical back: Normal range of motion and neck supple.  Neurological:     Mental Status: She is alert and oriented to person, place, and time.   BP 118/80 (BP Location: Left Arm, Patient Position: Sitting, Cuff Size: Normal)    Pulse 98    Temp 97.7 F (36.5 C) (Oral)    Resp 18    Ht 5\' 2"  (1.575 m)    Wt 122 lb 6.4 oz (55.5 kg)    SpO2 97%    BMI 22.39 kg/m  Wt Readings from Last 3 Encounters:  12/20/21 122 lb 6.4 oz (55.5 kg)  11/23/21 125 lb (56.7 kg)  11/01/21 124 lb (56.2 kg)     There are no preventive care reminders to display for this patient.  There are no preventive care reminders to display for this patient.  Lab Results  Component Value Date   TSH 1.07 08/04/2017   Lab Results  Component Value Date   WBC 6.7 12/14/2021   HGB 14.3 12/14/2021   HCT 42.5 12/14/2021   MCV 91.8 12/14/2021   PLT 204 12/14/2021   Lab Results  Component Value Date   NA 140 12/14/2021   K 3.9 12/14/2021   CO2 33 (H) 12/14/2021   GLUCOSE 73 12/14/2021   BUN 18 12/14/2021   CREATININE 1.00 12/14/2021   BILITOT 0.5 08/23/2021   ALKPHOS 50 08/23/2021   AST 22 08/23/2021   ALT 16 08/23/2021   PROT 6.7 08/23/2021   ALBUMIN 4.1 08/23/2021   CALCIUM 9.4 12/14/2021   ANIONGAP 14 08/12/2021   GFR 62.17  08/23/2021   Lab Results  Component Value Date   CHOL 220 (H) 07/22/2021   Lab Results  Component Value Date   HDL 65.70 07/22/2021   Lab Results  Component Value Date   LDLCALC 135 (H) 07/22/2021   Lab Results  Component Value Date   TRIG 95.0 07/22/2021   Lab Results  Component Value Date   CHOLHDL 3 07/22/2021   Lab Results  Component Value Date   HGBA1C 5.1 08/04/2017      Assessment & Plan:   Problem List Items Addressed This Visit       Unprioritized   Depression, major, single episode, in partial remission ([place])   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 ([place])   Atherosclerosis of aorta ([place])     Cholesterol control important Check labs today      DOE (dyspnea on exertion)    Xray normal  Ct -- + emphysema advair sent in  Check echo ekg--- RBBB      Relevant Orders   EKG 12-Lead (Completed)   ECHOCARDIOGRAM COMPLETE   Hyperlipidemia - Primary    Encourage heart healthy diet such as MIND or DASH diet, increase exercise, avoid trans fats, simple carbohydrates and processed foods, consider a krill or fish or flaxseed oil cap daily.       Relevant Orders   Lipid panel   Comprehensive metabolic panel   Pulmonary emphysema, unspecified emphysema type (HCC)   Relevant Medications   loratadine (CLARITIN) 10 MG tablet   fluticasone-salmeterol (ADVAIR) 100-50 MCG/ACT AEPB   Other Visit Diagnoses     Viral upper respiratory tract infection       Relevant Medications   loratadine (CLARITIN) 10 MG tablet   Chronic cough       Relevant Medications   fluticasone-salmeterol (ADVAIR) 100-50 MCG/ACT AEPB       Meds ordered this encounter  Medications   loratadine (CLARITIN) 10 MG tablet    Sig: Take 1 tablet (10 mg total) by mouth daily.    Dispense:  30 tablet    Refill:  11   fluticasone-salmeterol (ADVAIR) 100-50 MCG/ACT AEPB    Sig: Inhale 1 puff into the lungs 2 (two) times daily.    Dispense:  1 each    Refill:  3    Follow-up: Return in about 3 months (around 03/20/2022), or if symptoms worsen or fail to improve.    Ann Held, DO

## 2021-12-20 NOTE — Patient Instructions (Signed)
Shortness of Breath, Adult Shortness of breath is when a person has trouble breathing or when a person feels like she or he is having trouble breathing in enough air. Shortness of breath could be a sign of a medical problem. Follow these instructions at home: Pollutants Do not use any products that contain nicotine or tobacco. These products include cigarettes, chewing tobacco, and vaping devices, such as e-cigarettes. This also includes cigars and pipes. If you need help quitting, ask your health care provider. Avoid things that can irritate your airways, including: Smoke. This includes campfire smoke, forest fire smoke, and secondhand smoke from tobacco products. Do not smoke or allow others to smoke in your home. Mold. Dust. Air pollution. Chemical fumes. Things that can give you an allergic reaction (allergens) if you have allergies. Common allergens include pollen from grasses or trees and animal dander. Keep your living space clean and free of mold and dust. General instructions Pay attention to any changes in your symptoms. Take over-the-counter and prescription medicines only as told by your health care provider. This includes oxygen therapy and inhaled medicines. Rest as needed. Return to your normal activities as told by your health care provider. Ask your health care provider what activities are safe for you. Keep all follow-up visits. This is important. Contact a health care provider if: Your condition does not improve as soon as expected. You have a hard time doing your normal activities, even after you rest. You have new symptoms. You cannot walk up stairs or exercise the way that you normally do. Get help right away if: Your shortness of breath gets worse. You have shortness of breath when you are resting. You feel light-headed or you faint. You have a cough that is not controlled with medicines. You cough up blood. You have pain with breathing. You have pain in your  chest, arms, shoulders, or abdomen. You have a fever. These symptoms may be an emergency. Get help right away. Call 911. Do not wait to see if the symptoms will go away. Do not drive yourself to the hospital. Summary Shortness of breath is when a person has trouble breathing enough air. It can be a sign of a medical problem. Avoid things that irritate your lungs, such as smoking, pollution, mold, and dust. Pay attention to changes in your symptoms and contact your health care provider if you have a hard time completing daily activities because of shortness of breath. This information is not intended to replace advice given to you by your health care provider. Make sure you discuss any questions you have with your health care provider. Document Revised: 06/26/2021 Document Reviewed: 06/26/2021 Elsevier Patient Education  2022 Elsevier Inc.  

## 2021-12-21 ENCOUNTER — Encounter: Payer: Self-pay | Admitting: Podiatry

## 2021-12-21 LAB — COMPREHENSIVE METABOLIC PANEL
ALT: 16 U/L (ref 0–35)
AST: 26 U/L (ref 0–37)
Albumin: 4.1 g/dL (ref 3.5–5.2)
Alkaline Phosphatase: 50 U/L (ref 39–117)
BUN: 19 mg/dL (ref 6–23)
CO2: 31 mEq/L (ref 19–32)
Calcium: 9.9 mg/dL (ref 8.4–10.5)
Chloride: 103 mEq/L (ref 96–112)
Creatinine, Ser: 0.91 mg/dL (ref 0.40–1.20)
GFR: 62.03 mL/min (ref >60.00)
Glucose, Bld: 77 mg/dL (ref 70–99)
Potassium: 3.9 mEq/L (ref 3.5–5.1)
Sodium: 140 mEq/L (ref 135–145)
Total Bilirubin: 0.5 mg/dL (ref 0.2–1.2)
Total Protein: 6.7 g/dL (ref 6.0–8.3)

## 2021-12-21 LAB — LIPID PANEL
Cholesterol: 208 mg/dL — ABNORMAL HIGH (ref 0–200)
HDL: 59.6 mg/dL (ref >39.00)
LDL Cholesterol: 125 mg/dL — ABNORMAL HIGH (ref 0–99)
NonHDL: 148.5
Total CHOL/HDL Ratio: 3
Triglycerides: 116 mg/dL (ref 0.0–149.0)
VLDL: 23.2 mg/dL (ref 0.0–40.0)

## 2021-12-22 ENCOUNTER — Other Ambulatory Visit: Payer: Self-pay | Admitting: Family Medicine

## 2021-12-22 DIAGNOSIS — E785 Hyperlipidemia, unspecified: Secondary | ICD-10-CM

## 2022-01-04 ENCOUNTER — Ambulatory Visit: Payer: Medicare PPO | Admitting: Family Medicine

## 2022-01-05 ENCOUNTER — Other Ambulatory Visit: Payer: Self-pay | Admitting: Podiatry

## 2022-01-05 ENCOUNTER — Encounter: Payer: Self-pay | Admitting: Podiatry

## 2022-01-05 DIAGNOSIS — M25571 Pain in right ankle and joints of right foot: Secondary | ICD-10-CM | POA: Diagnosis not present

## 2022-01-05 DIAGNOSIS — M21611 Bunion of right foot: Secondary | ICD-10-CM | POA: Diagnosis not present

## 2022-01-05 DIAGNOSIS — M7752 Other enthesopathy of left foot: Secondary | ICD-10-CM | POA: Diagnosis not present

## 2022-01-05 DIAGNOSIS — M19071 Primary osteoarthritis, right ankle and foot: Secondary | ICD-10-CM | POA: Diagnosis not present

## 2022-01-05 MED ORDER — PROMETHAZINE HCL 25 MG PO TABS: 25.0000 mg | ORAL_TABLET | Freq: Three times a day (TID) | ORAL | 0 refills | Status: DC | PRN

## 2022-01-05 MED ORDER — CLINDAMYCIN HCL 300 MG PO CAPS: 300.0000 mg | ORAL_CAPSULE | Freq: Three times a day (TID) | ORAL | 0 refills | Status: DC

## 2022-01-05 MED ORDER — OXYCODONE-ACETAMINOPHEN 5-325 MG PO TABS
1.0000 | ORAL_TABLET | Freq: Four times a day (QID) | ORAL | 0 refills | Status: DC | PRN
Start: 2022-01-05 — End: 2022-01-10

## 2022-01-05 NOTE — Progress Notes (Signed)
Postop medications sent 

## 2022-01-10 ENCOUNTER — Other Ambulatory Visit: Payer: Self-pay

## 2022-01-10 ENCOUNTER — Ambulatory Visit (INDEPENDENT_AMBULATORY_CARE_PROVIDER_SITE_OTHER): Payer: Medicare PPO

## 2022-01-10 ENCOUNTER — Ambulatory Visit (INDEPENDENT_AMBULATORY_CARE_PROVIDER_SITE_OTHER): Payer: Medicare PPO | Admitting: Podiatry

## 2022-01-10 VITALS — BP 119/69 | HR 86 | Temp 97.6°F | Resp 18

## 2022-01-10 DIAGNOSIS — M21611 Bunion of right foot: Secondary | ICD-10-CM

## 2022-01-10 DIAGNOSIS — Z9889 Other specified postprocedural states: Secondary | ICD-10-CM

## 2022-01-10 MED ORDER — OXYCODONE-ACETAMINOPHEN 5-325 MG PO TABS: 1.0000 | ORAL_TABLET | Freq: Four times a day (QID) | ORAL | 0 refills | Status: DC | PRN

## 2022-01-10 MED ORDER — DOXYCYCLINE HYCLATE 100 MG PO TABS: 100.0000 mg | ORAL_TABLET | Freq: Two times a day (BID) | ORAL | 0 refills | Status: DC

## 2022-01-15 NOTE — Progress Notes (Signed)
Subjective: Samantha Miranda is a 76 y.o. is seen today in office s/p right foot first MPJ arthrodesis preformed on 01/05/2022.  States that she is doing well and her pain level is 5/10.  She states that she did have a fall on Friday.  She had a fever 100.8 for 2 days after surgery but that is since resolved.  She currently denies any fevers or chills.  No calf pain, chest pain, shortness of breath.    Objective: General: No acute distress, AAOx3  DP/PT pulses palpable 2/4, CRT < 3 sec to all digits.  Protective sensation intact. Motor function intact.  Right foot: Incision is well coapted without any evidence of dehiscence.  There is localized edema erythema of the distal portion incision more on the proximal phalanx base.  There is no fluctuation or crepitation.  There is no drainage or pus coming from the incision.  Mild tenderness palpation.  Arthrodesis site appears to be stable. No other areas of tenderness to bilateral lower extremities.  No other open lesions or pre-ulcerative lesions.  No pain with calf compression, swelling, warmth, erythema.   Assessment and Plan:  Status post right first MPJ arthrodesis  -Treatment options discussed including all alternatives, risks, and complications -X-rays obtained reviewed.  Hardware intact any complicating factors. -Given the localized erythema as well as the temperature 2 days postoperative although she currently is afebrile I would continue antibiotics and prescribe doxycycline. -Clean the incision with saline and antibiotic ointment and a bandage applied.  She can keep the dressing clean, dry, intact. -Continue ice and elevate -NWB -Monitor for any clinical signs or symptoms of infection and directed to call the office immediately should any occur or go to the ER.  No follow-ups on file.  Trula Slade DPM

## 2022-01-20 ENCOUNTER — Ambulatory Visit (INDEPENDENT_AMBULATORY_CARE_PROVIDER_SITE_OTHER): Payer: Medicare PPO | Admitting: Podiatry

## 2022-01-20 ENCOUNTER — Ambulatory Visit: Payer: Medicare PPO | Admitting: Family Medicine

## 2022-01-20 ENCOUNTER — Other Ambulatory Visit: Payer: Self-pay

## 2022-01-20 DIAGNOSIS — Z9889 Other specified postprocedural states: Secondary | ICD-10-CM

## 2022-01-20 DIAGNOSIS — M21611 Bunion of right foot: Secondary | ICD-10-CM

## 2022-01-21 ENCOUNTER — Other Ambulatory Visit: Payer: Self-pay | Admitting: Family Medicine

## 2022-01-21 DIAGNOSIS — E785 Hyperlipidemia, unspecified: Secondary | ICD-10-CM

## 2022-01-23 NOTE — Progress Notes (Signed)
Subjective: ?Samantha Miranda is a 76 y.o. is seen today in office s/p right foot first MPJ arthrodesis preformed on 01/05/2022.  States that overall she is doing better and she is having less pain.  She is not taking pain medicine in the last several days and she is still icing and elevating.  She denies any fevers or chills.  No nausea or vomiting.  No chest pain or shortness of breath.  No other concerns.  ? ?Objective: ?General: No acute distress, AAOx3  ?DP/PT pulses palpable 2/4, CRT < 3 sec to all digits.  ?Protective sensation intact. Motor function intact.  ?Right foot: Incision is well coapted without any evidence of dehiscence.  There is decreased edema and the erythema is present previously has resolved.  There is no drainage or pus or any signs of infection.  There is no clinical signs of infection.  The toes in rectus position of the arthrodesis site appears to be stable. ?No other areas of tenderness to bilateral lower extremities.  ?No other open lesions or pre-ulcerative lesions.  ?No pain with calf compression, swelling, warmth, erythema.  ? ?Assessment and Plan:  ?Status post right first MPJ arthrodesis ? ?-Treatment options discussed including all alternatives, risks, and complications ?-Discussed that she can wash the foot with soap and water dry thoroughly and apply antibiotic ointment and a bandage.  This was also applied today.  Continue to ice and elevate to help with any postoperative edema.  Continue nonweightbearing in the cam boot.  If needed she can put pressure on the heel for short amount of time for balance but in the CAM boot.  ?-Monitor for any clinical signs or symptoms of infection and directed to call the office immediately should any occur or go to the ER. ? ?Return in about 2 weeks (around 02/03/2022). ? ?Trula Slade DPM ?

## 2022-01-25 DIAGNOSIS — J309 Allergic rhinitis, unspecified: Secondary | ICD-10-CM | POA: Diagnosis not present

## 2022-01-25 DIAGNOSIS — K219 Gastro-esophageal reflux disease without esophagitis: Secondary | ICD-10-CM | POA: Diagnosis not present

## 2022-01-25 DIAGNOSIS — C50919 Malignant neoplasm of unspecified site of unspecified female breast: Secondary | ICD-10-CM | POA: Diagnosis not present

## 2022-01-25 DIAGNOSIS — M810 Age-related osteoporosis without current pathological fracture: Secondary | ICD-10-CM | POA: Diagnosis not present

## 2022-01-25 DIAGNOSIS — Z7722 Contact with and (suspected) exposure to environmental tobacco smoke (acute) (chronic): Secondary | ICD-10-CM | POA: Diagnosis not present

## 2022-01-25 DIAGNOSIS — J439 Emphysema, unspecified: Secondary | ICD-10-CM | POA: Diagnosis not present

## 2022-01-25 DIAGNOSIS — F324 Major depressive disorder, single episode, in partial remission: Secondary | ICD-10-CM | POA: Diagnosis not present

## 2022-01-25 DIAGNOSIS — E785 Hyperlipidemia, unspecified: Secondary | ICD-10-CM | POA: Diagnosis not present

## 2022-01-25 DIAGNOSIS — G8929 Other chronic pain: Secondary | ICD-10-CM | POA: Diagnosis not present

## 2022-02-02 ENCOUNTER — Telehealth: Payer: Self-pay

## 2022-02-02 NOTE — Telephone Encounter (Signed)
Called pt to reschedule appt on 05/31/22 to a different time that day but unable to leave vm due to phone just kept ringing. ?

## 2022-02-03 ENCOUNTER — Other Ambulatory Visit: Payer: Self-pay

## 2022-02-03 ENCOUNTER — Ambulatory Visit (INDEPENDENT_AMBULATORY_CARE_PROVIDER_SITE_OTHER): Payer: Medicare PPO | Admitting: Podiatry

## 2022-02-03 ENCOUNTER — Ambulatory Visit (INDEPENDENT_AMBULATORY_CARE_PROVIDER_SITE_OTHER): Payer: Medicare PPO

## 2022-02-03 DIAGNOSIS — Z9889 Other specified postprocedural states: Secondary | ICD-10-CM

## 2022-02-03 DIAGNOSIS — M21611 Bunion of right foot: Secondary | ICD-10-CM

## 2022-02-07 DIAGNOSIS — M21611 Bunion of right foot: Secondary | ICD-10-CM | POA: Insufficient documentation

## 2022-02-07 NOTE — Progress Notes (Signed)
Subjective: ?Samantha Miranda is a 76 y.o. is seen today in office s/p right foot first MPJ arthrodesis preformed on 01/05/2022.  States she been changing the bandage every other day.  She is having minimal discomfort.  She denies any drainage.  Maxipime is 4/10.  No fevers or chills that she reports and she has no other concerns today.  No chest pain or shortness of breath. ? ?Objective: ?General: No acute distress, AAOx3  ?DP/PT pulses palpable 2/4, CRT < 3 sec to all digits.  ?Protective sensation intact. Motor function intact.  ?Right foot: Incision is well coapted without any evidence of dehiscence.  Scars forming.  There is mild edema still present but appears to be overall improved.  There is no significant tenderness palpation at surgical site.  The toes are to position.  Arthrodesis site appears to be stable. ?No other areas of tenderness to bilateral lower extremities.  ?No other open lesions or pre-ulcerative lesions.  ?No pain with calf compression, swelling, warmth, erythema.  ? ?Assessment and Plan:  ?Status post right first MPJ arthrodesis ? ?-Treatment options discussed including all alternatives, risks, and complications ?-X-rays obtained reviewed.  Status post first MPJ arthrodesis with hardware intact any complicating factors. ?-Discussed that she can start to transition to partial weightbearing as tolerated.  He is to wear the cam boot at all times, ice and elevation as well as compression of any postoperative edema. ? ?Return in about 2 weeks (around 02/17/2022).  Repeat x-rays next appointment ? ?Trula Slade DPM ?

## 2022-02-09 ENCOUNTER — Encounter: Payer: Self-pay | Admitting: Family Medicine

## 2022-02-09 ENCOUNTER — Telehealth: Payer: Self-pay | Admitting: Hematology

## 2022-02-09 MED ORDER — ZYPITAMAG 2 MG PO TABS: 2.0000 mg | ORAL_TABLET | Freq: Every day | ORAL | 0 refills | Status: DC

## 2022-02-09 NOTE — Telephone Encounter (Signed)
Called pt to r/s per 3/21 inbasket, pt informed that she recently had foot surgery so she requested to push the appt out several weeks, scheduled with pt, pt aware ?

## 2022-02-16 ENCOUNTER — Other Ambulatory Visit: Payer: Medicare PPO

## 2022-02-16 ENCOUNTER — Ambulatory Visit: Payer: Medicare PPO | Admitting: Hematology

## 2022-02-17 ENCOUNTER — Ambulatory Visit (INDEPENDENT_AMBULATORY_CARE_PROVIDER_SITE_OTHER): Payer: Medicare PPO | Admitting: Podiatry

## 2022-02-17 ENCOUNTER — Ambulatory Visit (INDEPENDENT_AMBULATORY_CARE_PROVIDER_SITE_OTHER): Payer: Medicare PPO

## 2022-02-17 DIAGNOSIS — Z9889 Other specified postprocedural states: Secondary | ICD-10-CM

## 2022-02-17 DIAGNOSIS — M21611 Bunion of right foot: Secondary | ICD-10-CM

## 2022-02-22 NOTE — Progress Notes (Signed)
Subjective: ?GISSEL Miranda is a 76 y.o. is seen today in office s/p right foot first MPJ arthrodesis preformed on 01/05/2022.  She said that she has been doing well.  She denies having any pain.  She has little bit of swelling.  She has been walking in the cam boot she is, "for very short amount of time.  She denies any fevers or chills.  She has no new concerns today.   ? ?Objective: ?General: No acute distress, AAOx3  ?DP/PT pulses palpable 2/4, CRT < 3 sec to all digits.  ?Protective sensation intact. Motor function intact.  ?Right foot: Incision is well coapted without any evidence of dehiscence.  Scar is well formed.  Trace edema.  No erythema or warmth.  No obvious signs of infection.  The arthrodesis site is stable.  There is no pain on exam. ?No other open lesions or pre-ulcerative lesions.  ?No pain with calf compression, swelling, warmth, erythema.  ? ?Assessment and Plan:  ?Status post right first MPJ arthrodesis ? ?-Treatment options discussed including all alternatives, risks, and complications ?-X-rays obtained reviewed.  3 views of the right foot were obtained.  Status post first MPJ arthrodesis with hardware intact any complicating factors.  Hardware intact there is increased consolidation noted. ?-At this point she is doing well we discussed transitioning to regular shoe as tolerated we discussed gradual transition.  Continue ice, elevate as well as compression for postoperative edema. ? ?Return in about 6 weeks (around 03/31/2022). Repeat x-rays next appointment ? ?Trula Slade DPM ?

## 2022-03-09 ENCOUNTER — Encounter (HOSPITAL_COMMUNITY): Payer: Self-pay

## 2022-03-16 ENCOUNTER — Ambulatory Visit (HOSPITAL_BASED_OUTPATIENT_CLINIC_OR_DEPARTMENT_OTHER)
Admission: RE | Admit: 2022-03-16 | Discharge: 2022-03-16 | Disposition: A | Discharge status: Home / Self Care | Payer: Medicare PPO | Source: Ambulatory Visit | Attending: Family Medicine | Admitting: Family Medicine

## 2022-03-16 DIAGNOSIS — R0609 Other forms of dyspnea: Secondary | ICD-10-CM | POA: Insufficient documentation

## 2022-03-16 LAB — ECHOCARDIOGRAM COMPLETE
AR max vel: 2.86 cm2
AV Area VTI: 2.72 cm2
AV Area mean vel: 2.59 cm2
AV Mean grad: 4 mmHg
AV Peak grad: 6.7 mmHg
Ao pk vel: 1.29 m/s
Area-P 1/2: 2.41 cm2
S' Lateral: 2.1 cm

## 2022-03-16 NOTE — Progress Notes (Signed)
?  Echocardiogram ?2D Echocardiogram has been performed. ? ?Elmer Ramp ?03/16/2022, 1:54 PM ?

## 2022-03-21 ENCOUNTER — Encounter: Payer: Self-pay | Admitting: Family Medicine

## 2022-03-21 ENCOUNTER — Ambulatory Visit: Payer: Medicare PPO | Admitting: Family Medicine

## 2022-03-21 VITALS — BP 102/70 | HR 92 | Temp 98.7°F | Resp 18 | Ht 62.0 in | Wt 119.4 lb

## 2022-03-21 DIAGNOSIS — J209 Acute bronchitis, unspecified: Secondary | ICD-10-CM | POA: Diagnosis not present

## 2022-03-21 DIAGNOSIS — J40 Bronchitis, not specified as acute or chronic: Secondary | ICD-10-CM

## 2022-03-21 DIAGNOSIS — R051 Acute cough: Secondary | ICD-10-CM | POA: Diagnosis not present

## 2022-03-21 DIAGNOSIS — E785 Hyperlipidemia, unspecified: Secondary | ICD-10-CM

## 2022-03-21 LAB — LIPID PANEL
Cholesterol: 147 mg/dL (ref 0–200)
HDL: 70.5 mg/dL (ref >39.00)
LDL Cholesterol: 61 mg/dL (ref 0–99)
NonHDL: 76.04
Total CHOL/HDL Ratio: 2
Triglycerides: 76 mg/dL (ref 0.0–149.0)
VLDL: 15.2 mg/dL (ref 0.0–40.0)

## 2022-03-21 LAB — COMPREHENSIVE METABOLIC PANEL
ALT: 30 U/L (ref 0–35)
AST: 36 U/L (ref 0–37)
Albumin: 4.3 g/dL (ref 3.5–5.2)
Alkaline Phosphatase: 54 U/L (ref 39–117)
BUN: 15 mg/dL (ref 6–23)
CO2: 27 mEq/L (ref 19–32)
Calcium: 9.1 mg/dL (ref 8.4–10.5)
Chloride: 103 mEq/L (ref 96–112)
Creatinine, Ser: 1.03 mg/dL (ref 0.40–1.20)
GFR: 53.37 mL/min — ABNORMAL LOW (ref >60.00)
Glucose, Bld: 80 mg/dL (ref 70–99)
Potassium: 4.5 mEq/L (ref 3.5–5.1)
Sodium: 138 mEq/L (ref 135–145)
Total Bilirubin: 0.4 mg/dL (ref 0.2–1.2)
Total Protein: 7.1 g/dL (ref 6.0–8.3)

## 2022-03-21 LAB — POC COVID19 BINAXNOW: SARS Coronavirus 2 Ag: NEGATIVE

## 2022-03-21 MED ORDER — AZITHROMYCIN 250 MG PO TABS: ORAL_TABLET | ORAL | 0 refills | Status: DC

## 2022-03-21 MED ORDER — PROMETHAZINE-DM 6.25-15 MG/5ML PO SYRP: ORAL_SOLUTION | ORAL | 0 refills | Status: DC

## 2022-03-21 NOTE — Patient Instructions (Signed)
Acute Bronchitis, Adult ? ?Acute bronchitis is sudden inflammation of the main airways (bronchi) that come off the windpipe (trachea) in the lungs. The swelling causes the airways to get smaller and make more mucus than normal. This can make it hard to breathe and can cause coughing or noisy breathing (wheezing). ?Acute bronchitis may last several weeks. The cough may last longer. Allergies, asthma, and exposure to smoke may make the condition worse. ?What are the causes? ?This condition can be caused by germs and by substances that irritate the lungs, including: ?Cold and flu viruses. The most common cause of this condition is the virus that causes the common cold. ?Bacteria. This is less common. ?Breathing in substances that irritate the lungs, including: ?Smoke from cigarettes and other forms of tobacco. ?Dust and pollen. ?Fumes from household cleaning products, gases, or burned fuel. ?Indoor or outdoor air pollution. ?What increases the risk? ?The following factors may make you more likely to develop this condition: ?A weak body's defense system, also called the immune system. ?A condition that affects your lungs and breathing, such as asthma. ?What are the signs or symptoms? ?Common symptoms of this condition include: ?Coughing. This may bring up clear, yellow, or green mucus from your lungs (sputum). ?Wheezing. ?Runny or stuffy nose. ?Having too much mucus in your lungs (chest congestion). ?Shortness of breath. ?Aches and pains, including sore throat or chest. ?How is this diagnosed? ?This condition is usually diagnosed based on: ?Your symptoms and medical history. ?A physical exam. ?You may also have other tests, including tests to rule out other conditions, such as pneumonia. These tests include: ?A test of lung function. ?Test of a mucus sample to look for the presence of bacteria. ?Tests to check the oxygen level in your blood. ?Blood tests. ?Chest X-ray. ?How is this treated? ?Most cases of acute  bronchitis clear up over time without treatment. Your health care provider may recommend: ?Drinking more fluids to help thin your mucus so it is easier to cough up. ?Taking inhaled medicine (inhaler) to improve air flow in and out of your lungs. ?Using a vaporizer or a humidifier. These are machines that add water to the air to help you breathe better. ?Taking a medicine that thins mucus and clears congestion (expectorant). ?Taking a medicine that prevents or stops coughing (cough suppressant). ?It is notcommon to take an antibiotic medicine for this condition. ?Follow these instructions at home: ? ?Take over-the-counter and prescription medicines only as told by your health care provider. ?Use an inhaler, vaporizer, or humidifier as told by your health care provider. ?Take two teaspoons (10 mL) of honey at bedtime to lessen coughing at night. ?Drink enough fluid to keep your urine pale yellow. ?Do not use any products that contain nicotine or tobacco. These products include cigarettes, chewing tobacco, and vaping devices, such as e-cigarettes. If you need help quitting, ask your health care provider. ?Get plenty of rest. ?Return to your normal activities as told by your health care provider. Ask your health care provider what activities are safe for you. ?Keep all follow-up visits. This is important. ?How is this prevented? ?To lower your risk of getting this condition again: ?Wash your hands often with soap and water for at least 20 seconds. If soap and water are not available, use hand sanitizer. ?Avoid contact with people who have cold symptoms. ?Try not to touch your mouth, nose, or eyes with your hands. ?Avoid breathing in smoke or chemical fumes. Breathing smoke or chemical fumes will make your   condition worse. ?Get the flu shot every year. ?Contact a health care provider if: ?Your symptoms do not improve after 2 weeks. ?You have trouble coughing up the mucus. ?Your cough keeps you awake at night. ?You have a  fever. ?Get help right away if you: ?Cough up blood. ?Feel pain in your chest. ?Have severe shortness of breath. ?Faint or keep feeling like you are going to faint. ?Have a severe headache. ?Have a fever or chills that get worse. ?These symptoms may represent a serious problem that is an emergency. Do not wait to see if the symptoms will go away. Get medical help right away. Call your local emergency services (911 in the [place] Do not drive yourself to the hospital. ?Summary ?Acute bronchitis is inflammation of the main airways (bronchi) that come off the windpipe (trachea) in the lungs. The swelling causes the airways to get smaller and make more mucus than normal. ?Drinking more fluids can help thin your mucus so it is easier to cough up. ?Take over-the-counter and prescription medicines only as told by your health care provider. ?Do not use any products that contain nicotine or tobacco. These products include cigarettes, chewing tobacco, and vaping devices, such as e-cigarettes. If you need help quitting, ask your health care provider. ?Contact a health care provider if your symptoms do not improve after 2 weeks. ?This information is not intended to replace advice given to you by your health care provider. Make sure you discuss any questions you have with your health care provider. ?Document Revised: 03/10/2021 Document Reviewed: 03/10/2021 ?Elsevier Patient Education ? 2023 Elsevier Inc. ? ?

## 2022-03-21 NOTE — Assessment & Plan Note (Signed)
Encourage heart healthy diet such as MIND or DASH diet, increase exercise, avoid trans fats, simple carbohydrates and processed foods, consider a krill or fish or flaxseed oil cap daily.  °

## 2022-03-21 NOTE — Assessment & Plan Note (Signed)
Phenergan dm for cough ?abx per orders ?Call or rto prn  ?

## 2022-03-21 NOTE — Progress Notes (Signed)
? ?Subjective:  ? ?By signing my name below, I, Carylon Perches, attest that this documentation has been prepared under the direction and in the presence of Roma Schanz DO, 03/21/2022  ? ? Patient ID: Samantha Miranda, female    DOB: 1946-08-03, 76 y.o.   MRN: [id number] ? ?Chief Complaint  ?Patient presents with  ? Cough  ?  Pt states having cough and fever and fatigue since Saturday. Pt states no COVID test.    ? ? ?Hyperlipidemia ?Associated symptoms include myalgias.  ?Patient is in today for an office visit. ? ?She complains of symptoms of coughing and body aches that begun on 02/16/2022. On that day, she was gardening and then became exhausted and couldn't breathe, when she went inside her house she begun feeling the onset of symptoms. She developed a fever that day but the symptoms are now resolved. She also had a sore throat but symptoms resolved as well. She continues to have coughing and body aches. She has been taking Mucinex and 25 MG of Phenergan. She denies of any congestion. Her COVID test is reported to be negative. ? ?Past Medical History:  ?Diagnosis Date  ? Barrett's esophagus   ? Basal cell carcinoma of left lower leg   ? Breast cancer ([place])   ? right  ? COPD (chronic obstructive pulmonary disease) ([place]) 2018  ? Depression 1989  ? Gastric ulcer   ? GERD (gastroesophageal reflux disease)   ? History of colon polyps   ? Hyperlipidemia   ? Osteopenia   ? ? ?Past Surgical History:  ?Procedure Laterality Date  ? ABDOMINAL HYSTERECTOMY  11/21/1989  ? APPENDECTOMY  1969  ? BREAST LUMPECTOMY  07/22/2006  ? BREAST LUMPECTOMY WITH RADIOACTIVE SEED AND SENTINEL LYMPH NODE BIOPSY Right 07/02/2021  ? Procedure: RIGHT BREAST LUMPECTOMY WITH RADIOACTIVE SEED AND RIGHT AXILLARY SENTINEL LYMPH NODE BIOPSY;  Surgeon: Rolm Bookbinder, MD;  Location: [facility];  Service: General;  Laterality: Right;  ? CATARACT EXTRACTION, BILATERAL    ? EYE SURGERY  08/2013  10/2013  ? cataracts  ? TONSILLECTOMY    ? ? ?Family  History  ?Problem Relation Age of Onset  ? Heart failure Mother   ?     CHF  ? Hyperlipidemia Mother   ? Aortic stenosis Mother   ? Arthritis Mother   ? Heart disease Mother   ? Lung cancer Father 74  ? Cancer Father   ? Early death Father   ? Breast cancer Sister 57  ? Cancer Sister   ? Depression Sister   ? Stroke Maternal Aunt   ? Varicose Veins Maternal Aunt   ? Diabetes Maternal Grandmother   ? Hyperlipidemia Maternal Grandmother   ? Colon cancer Maternal Grandfather   ? Cancer Maternal Grandfather   ?     colon cancer  ? Cancer Paternal Grandfather   ?     unknown type cancer  ? Drug abuse Daughter   ? Heart attack Neg Hx   ? Hypertension Neg Hx   ? Sudden death Neg Hx   ? Esophageal cancer Neg Hx   ? Stomach cancer Neg Hx   ? Rectal cancer Neg Hx   ? ? ?Social History  ? ?Socioeconomic History  ? Marital status: Married  ?  Spouse name: Not on file  ? Number of children: 1  ? Years of education: Not on file  ? Highest education level: Not on file  ?Occupational History  ? Occupation: retired  ?  Employer: [employer]  ?  Comment: retired  ?Tobacco Use  ? Smoking status: Former  ?  Packs/day: 0.50  ?  Years: 40.00  ?  Pack years: 20.00  ?  Types: Cigarettes, E-cigarettes  ?  Quit date: 07/23/2011  ?  Years since quitting: 10.6  ? Smokeless tobacco: Never  ? Tobacco comments:  ?  The last 5 yrs. I smoked .5 pack a day. Before that, 1 pk. a day  ?Vaping Use  ? Vaping Use: Former  ? Substances: Nicotine  ?Substance and Sexual Activity  ? Alcohol use: No  ?  Comment: occasional wine  ? Drug use: No  ? Sexual activity: Not Currently  ?  Partners: Male  ?  Birth control/protection: Post-menopausal  ?Other Topics Concern  ? Not on file  ?Social History Narrative  ? Exercise-no  ? ?Social Determinants of Health  ? ?Financial Resource Strain: Low Risk   ? Difficulty of Paying Living Expenses: Not hard at all  ?Food Insecurity: No Food Insecurity  ? Worried About Charity fundraiser in the Last Year: Never true   ? Ran Out of Food in the Last Year: Never true  ?Transportation Needs: No Transportation Needs  ? Lack of Transportation (Medical): No  ? Lack of Transportation (Non-Medical): No  ?Physical Activity: Sufficiently Active  ? Days of Exercise per Week: 6 days  ? Minutes of Exercise per Session: 30 min  ?Stress: No Stress Concern Present  ? Feeling of Stress : Not at all  ?Social Connections: Moderately Isolated  ? Frequency of Communication with Friends and Family: More than three times a week  ? Frequency of Social Gatherings with Friends and Family: More than three times a week  ? Attends Religious Services: Never  ? Active Member of Clubs or Organizations: No  ? Attends Archivist Meetings: Never  ? Marital Status: Married  ?Intimate Partner Violence: Not At Risk  ? Fear of Current or Ex-Partner: No  ? Emotionally Abused: No  ? Physically Abused: No  ? Sexually Abused: No  ? ? ?Outpatient Medications Prior to Visit  ?Medication Sig Dispense Refill  ? Biotin w/ Vitamins C & E (HAIR SKIN & NAILS GUMMIES PO) Take 2 each by mouth daily.    ? Calcium-Magnesium-Zinc (CAL-MAG-ZINC PO) Take 2 tablets by mouth daily.    ? cholecalciferol (VITAMIN D3) 25 MCG (1000 UNIT) tablet Take 1,000 Units by mouth daily.    ? Efinaconazole 10 % SOLN Apply 1 drop topically daily. 4 mL 11  ? ezetimibe (ZETIA) 10 MG tablet TAKE ONE TABLET BY MOUTH ONE TIME DAILY 90 tablet 1  ? FLUoxetine (PROZAC) 10 MG tablet TAKE ONE TABLET BY MOUTH ONE TIME DAILY 90 tablet 1  ? fluticasone (FLONASE) 50 MCG/ACT nasal spray Place 2 sprays into both nostrils daily. (Patient taking differently: Place 2 sprays into both nostrils daily as needed for allergies.) 16 g 5  ? fluticasone-salmeterol (ADVAIR) 100-50 MCG/ACT AEPB Inhale 1 puff into the lungs 2 (two) times daily. 1 each 3  ? hydrocortisone (ANUSOL-HC) 2.5 % rectal cream Place 1 application rectally 2 (two) times daily as needed for hemorrhoids or anal itching. 30 g 1  ? loratadine (CLARITIN)  10 MG tablet Take 1 tablet (10 mg total) by mouth daily. 30 tablet 11  ? Omega-3 Fatty Acids (FISH OIL) 1200 MG CAPS Take 1,200 mg by mouth daily.    ? ondansetron (ZOFRAN ODT) 4 MG disintegrating tablet Take 1  tablet (4 mg total) by mouth every 8 (eight) hours as needed for nausea or vomiting. 20 tablet 0  ? pantoprazole (PROTONIX) 40 MG tablet Take 1 tablet (40 mg total) by mouth 2 (two) times daily. 180 tablet 3  ? promethazine (PHENERGAN) 25 MG tablet Take 1 tablet (25 mg total) by mouth every 8 (eight) hours as needed for nausea or vomiting. 20 tablet 0  ? vitamin C (ASCORBIC ACID) 250 MG tablet Take 250 mg by mouth daily.    ? XIIDRA 5 % SOLN Place 1 drop into both eyes in the morning and at bedtime.  1  ? zolpidem (AMBIEN) 5 MG tablet Take 1 tablet (5 mg total) by mouth at bedtime as needed for sleep. 30 tablet 1  ? ZYPITAMAG 2 MG TABS Take 2 mg by mouth daily. 90 tablet 0  ? promethazine-dextromethorphan (PROMETHAZINE-DM) 6.25-15 MG/5ML syrup TAKE FIVE ML BY MOUTH FOUR TIMES A DAY AS NEEDED 118 mL 0  ? COVID-19 mRNA bivalent vaccine, Pfizer, injection Inject into the muscle. (Patient not taking: Reported on 03/21/2022) 0.3 mL 0  ? clindamycin (CLEOCIN) 300 MG capsule Take 1 capsule (300 mg total) by mouth 3 (three) times daily. 9 capsule 0  ? doxycycline (VIBRA-TABS) 100 MG tablet Take 1 tablet (100 mg total) by mouth 2 (two) times daily. 14 tablet 0  ? oxyCODONE-acetaminophen (PERCOCET/ROXICET) 5-325 MG tablet Take 1-2 tablets by mouth every 6 (six) hours as needed for severe pain. 15 tablet 0  ? ?No facility-administered medications prior to visit.  ? ? ?Allergies  ?Allergen Reactions  ? Amoxicillin-Pot Clavulanate Nausea And Vomiting  ? Moxifloxacin Nausea And Vomiting  ? ? ?Review of Systems  ?HENT:  Negative for congestion.   ?Respiratory:  Positive for cough.   ?Musculoskeletal:  Positive for myalgias.  ? ?   ?Objective:  ?  ?Physical Exam ?Constitutional:   ?   General: She is not in acute distress. ?    Appearance: Normal appearance. She is not ill-appearing.  ?HENT:  ?   Head: Normocephalic and atraumatic.  ?   Right Ear: External ear normal.  ?   Left Ear: External ear normal.  ?Eyes:  ?   Extraocul

## 2022-03-24 ENCOUNTER — Other Ambulatory Visit: Payer: Self-pay

## 2022-03-24 DIAGNOSIS — Z170 Estrogen receptor positive status [ER+]: Secondary | ICD-10-CM

## 2022-03-24 DIAGNOSIS — Z171 Estrogen receptor negative status [ER-]: Secondary | ICD-10-CM

## 2022-03-25 ENCOUNTER — Encounter: Payer: Self-pay | Admitting: Family Medicine

## 2022-03-25 ENCOUNTER — Other Ambulatory Visit: Payer: Self-pay

## 2022-03-25 ENCOUNTER — Inpatient Hospital Stay: Payer: Medicare PPO | Attending: Nurse Practitioner

## 2022-03-25 ENCOUNTER — Inpatient Hospital Stay (HOSPITAL_BASED_OUTPATIENT_CLINIC_OR_DEPARTMENT_OTHER): Payer: Medicare PPO | Admitting: Hematology

## 2022-03-25 VITALS — BP 167/81 | HR 85 | Temp 97.6°F | Resp 16 | Wt 119.1 lb

## 2022-03-25 DIAGNOSIS — Z8719 Personal history of other diseases of the digestive system: Secondary | ICD-10-CM | POA: Diagnosis not present

## 2022-03-25 DIAGNOSIS — C50111 Malignant neoplasm of central portion of right female breast: Secondary | ICD-10-CM | POA: Insufficient documentation

## 2022-03-25 DIAGNOSIS — G4700 Insomnia, unspecified: Secondary | ICD-10-CM | POA: Insufficient documentation

## 2022-03-25 DIAGNOSIS — Z171 Estrogen receptor negative status [ER-]: Secondary | ICD-10-CM

## 2022-03-25 DIAGNOSIS — F32A Depression, unspecified: Secondary | ICD-10-CM | POA: Insufficient documentation

## 2022-03-25 DIAGNOSIS — Z79810 Long term (current) use of selective estrogen receptor modulators (SERMs): Secondary | ICD-10-CM | POA: Insufficient documentation

## 2022-03-25 DIAGNOSIS — Z170 Estrogen receptor positive status [ER+]: Secondary | ICD-10-CM | POA: Diagnosis not present

## 2022-03-25 LAB — CMP (CANCER CENTER ONLY)
ALT: 26 U/L (ref 0–44)
AST: 30 U/L (ref 15–41)
Albumin: 4.1 g/dL (ref 3.5–5.0)
Alkaline Phosphatase: 51 U/L (ref 38–126)
Anion gap: 6 (ref 5–15)
BUN: 18 mg/dL (ref 8–23)
CO2: 28 mmol/L (ref 22–32)
Calcium: 9.2 mg/dL (ref 8.9–10.3)
Chloride: 106 mmol/L (ref 98–111)
Creatinine: 0.79 mg/dL (ref 0.44–1.00)
GFR, Estimated: >60 mL/min (ref >60)
Glucose, Bld: 85 mg/dL (ref 70–99)
Potassium: 3.9 mmol/L (ref 3.5–5.1)
Sodium: 140 mmol/L (ref 135–145)
Total Bilirubin: 0.5 mg/dL (ref 0.3–1.2)
Total Protein: 7.4 g/dL (ref 6.5–8.1)

## 2022-03-25 LAB — CBC WITH DIFFERENTIAL (CANCER CENTER ONLY)
Abs Immature Granulocytes: 0.01 10*3/uL (ref 0.00–0.07)
Basophils Absolute: 0 10*3/uL (ref 0.0–0.1)
Basophils Relative: 1 %
Eosinophils Absolute: 0.2 10*3/uL (ref 0.0–0.5)
Eosinophils Relative: 4 %
HCT: 40 % (ref 36.0–46.0)
Hemoglobin: 13.3 g/dL (ref 12.0–15.0)
Immature Granulocytes: 0 %
Lymphocytes Relative: 28 %
Lymphs Abs: 1.5 10*3/uL (ref 0.7–4.0)
MCH: 30.4 pg (ref 26.0–34.0)
MCHC: 33.3 g/dL (ref 30.0–36.0)
MCV: 91.3 fL (ref 80.0–100.0)
Monocytes Absolute: 0.3 10*3/uL (ref 0.1–1.0)
Monocytes Relative: 6 %
Neutro Abs: 3.3 10*3/uL (ref 1.7–7.7)
Neutrophils Relative %: 61 %
Platelet Count: 169 10*3/uL (ref 150–400)
RBC: 4.38 MIL/uL (ref 3.87–5.11)
RDW: 13 % (ref 11.5–15.5)
WBC Count: 5.3 10*3/uL (ref 4.0–10.5)
nRBC: 0 % (ref 0.0–0.2)

## 2022-03-25 MED ORDER — TAMOXIFEN CITRATE 10 MG PO TABS: 10.0000 mg | ORAL_TABLET | Freq: Two times a day (BID) | ORAL | 0 refills | Status: DC

## 2022-03-25 NOTE — Progress Notes (Signed)
?[name]   ?Telephone:(336) [phone] [phone]   ?Clinic Follow up Note  ? ?Patient Care Team: ?Carollee Herter, Alferd Apa, DO as PCP - General ?Shirley Muscat Loreen Freud, MD as Referring Physician (Optometry) ?Izora Ribas (Dermatology) ?Mauro Kaufmann, RN as Oncology Nurse Navigator ?Rockwell Germany, RN as Oncology Nurse Navigator ?Rolm Bookbinder, MD as Consulting Physician (General Surgery) ?Truitt Merle, MD as Consulting Physician (Hematology) ?Kyung Rudd, MD as Consulting Physician (Radiation Oncology) ? ?Date of Service:  03/25/2022 ? ?CHIEF COMPLAINT: f/u of right breast cancer ? ?CURRENT THERAPY:  ?To start tamoxifen ? ?ASSESSMENT & PLAN:  ?Samantha Miranda is a 76 y.o. post-hysterectomy female with  ? ?1. 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 S/p lumpectomy on 07/02/21 by Dr. Donne Hazel showed 1.2 cm IDC and DCIS, margins and lymph nodes negative. ?-given her previous right breast radiation, additional radiation is not recommended. ?-RS of 28, risk of 17%, chemo was recommended but pt declined ?-she tried exemestane but ultimately discontinued after 2 months due to worsening depression, insomnia, and joint pain. ?-we discussed switching to tamoxifen. The potential side effects, which includes but not limited to, hot flash, skin and vaginal dryness, slightly increased risk of cardiovascular disease and cataract, small risk of thrombosis and endometrial cancer (she had history of hysterectomy, no risk for endometrial cancer), were discussed with her in great details. Preventive strategies for thrombosis, such as being physically active, using compression stocks, avoid cigarette smoking, etc., were reviewed with her. She voiced good understanding, and agrees to proceed.  Due to her previous poor tolerance to AI, we will start on low dose at 10 mg daily.  If she tolerates well,  plan to increase to 20 mg from second months. 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  She will watch herself and call us if she develops depression again. ?-she is clinically doing well. Labs reviewed, all WNL. Physical exam was unremarkable. There is no clinical concern for recurrence. ?  ?2.  Depression  ?-She started taking Prozac after the death of her sister, on low-dose 10 mg daily since then ?-She weaned herself off Prozac in late 2022. She does not want to start any new anti-depressants right now.  ?-She will let us know if her depression worsens on tamoxifen. ?  ?3. H/o Right Breast DCIS, ER+/PR+ ?-diagnosed in 2007, s/p surgery and radiation therapy. She did not receive antiestrogen therapy. ?  ?4. Bone Health  ?-Repeat DEXA 09/21/21 showed osteoporosis in the left femur neck with T score -2.8 ?-She currently takes calcium, vitamin D, and performs weightbearing exercise ?-bisphosphonate was previously discussed.  ?-she will try tamoxifen, which can strengthen her bones. ?  ?5. Genetic Testing ?-due to her strong family history of cancer, she is eligible for genetic testing.  ?-I recommended this given she has a daughter. She declines, as she does not want to know. ?  ?6. Social Support ?-her sister had breast cancer, was treated with chemo and radiation, and ultimately still died from it. ?-she is estranged from her daughter due to her daughter's dug use/addiction.  ?-she has support at home from her husband. ? ?  ?PLAN ?-start tamoxifen 15m daily, I called in today.  If he tolerates well, will increase to 20 mg daily from second months.  [place]  ?-phone visit with NP Lacie in  3 months ?-lab and f/u in 6 months ? ? ?No problem-specific Assessment & Plan notes found for this encounter. ? ? ?SUMMARY OF ONCOLOGIC HISTORY: ?Oncology History Overview Note  ?Cancer Staging ?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 ([place]) ?Staging form:  Breast, AJCC 8th Edition ?- Clinical stage from 06/21/2021: Stage IA (cT1b, cN0, cM0, G1, ER+, PR+, HER2-) - Signed by Truitt Merle, MD on 06/23/2021 ?Stage prefix: Initial diagnosis ?Histologic grading system: 3 grade system ?- Pathologic stage from 07/02/2021: Stage IA (pT1c, pN0, cM0, G1, ER+, PR+, HER2-, Oncotype DX score: 28) - Signed by Truitt Merle, MD on 07/21/2021 ?Stage prefix: Initial diagnosis ?Multigene prognostic tests performed: Oncotype DX ?Recurrence score range: Greater than or equal to 11 ?Histologic grading system: 3 grade system ? ? ?  ?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 [facility])  ?06/10/2021 Mammogram  ? Right Diagnostic Mammogram; Right Breast Ultrasound ? ?IMPRESSION:  ?The 1 cm x 1 cm x 1 cm irregular mass in the right breast is suspicious of malignancy. ?  ?06/17/2021 Pathology Results  ? Diagnosis ?Breast, right, needle core biopsy, 3:00, 4 cmfn ? - INVASIVE MAMMARY CARCINOMA, SEE COMMENT ? - MAMMARY CARCINOMA IN SITU. ?The carcinoma appears grade 1. E-cadherin is positive in the invasive and in situ carcinoma consistent with a ductal phenotype. ? ?PROGNOSTIC INDICATORS ?Results: ?IMMUNOHISTOCHEMICAL AND MORPHOMETRIC ANALYSIS PERFORMED MANUALLY ?The tumor cells are EQUIVOCAL for HER2 (2+). Her2 by FISH will be performed and results reported separately. ?Estrogen Receptor: 90%, POSITIVE, STRONG STAINING INTENSITY ?Progesterone Receptor: 5%, POSITIVE, STRONG STAINING INTENSITY ?Proliferation Marker Ki67: 10% ? ?Her2 negative by FISH ?  ?06/21/2021 Cancer Staging  ? Staging form: Breast, AJCC 8th Edition ?- Clinical stage from 06/21/2021: Stage IA (cT1b, cN0, cM0, G1, ER+, PR+, HER2-) - Signed by Truitt Merle, MD on 06/23/2021 ?Stage prefix: Initial diagnosis ?Histologic grading system: 3 grade system ? ?  ?06/23/2021 Initial Diagnosis  ? 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 ([place]) ? ?  ?07/02/2021 Pathology Results  ? FINAL MICROSCOPIC DIAGNOSIS:  ? ?A. BREAST, RIGHT, LUMPECTOMY:  ?- Invasive  and in situ ductal carcinoma, 1.2 cm.  ?- Carcinoma is 0.7 cm from superior margin.  ?- Biopsy site and biopsy clip.  ?- See oncology table.  ? ?B. BREAST, RIGHT ADDITIONAL INFERIOR MARGIN, EXCISION:  ?- Fibrocystic changes.  ?- Final inferior margin negative for carcinoma.  ? ?C. LYMPH NODE, RIGHT AXILLARY, SENTINEL, EXCISION:  ?- One lymph node with capsular nevus and negative for metastatic  ?carcinoma (0/1).  ? ?D. LYMPH NODE, RIGHT AXILLARY, SENTINEL, EXCISION:  ?- One lymph node negative for metastatic carcinoma (0/1).  ? ?E. LYMPH NODE, RIGHT AXILLARY, SENTINEL, EXCISION:  ?- One lymph node negative for metastatic carcinoma (0/1).  ? ?F. LYMPH NODE, RIGHT AXILLARY, SENTINEL, EXCISION:  ?- One lymph node negative for metastatic carcinoma (0/1).  ? ?G. LYMPH NODE, RIGHT AXILLARY, SENTINEL, EXCISION:  ?- One lymph node negative for metastatic carcinoma (0/1).  ? ?H. BREAST, RIGHT ADDITIONAL LATERAL MARGIN, EXCISION:  ?- Fibrocystic changes.  ?- Final lateral margin negative for carcinoma.  ? ?I. BREAST, RIGHT ADDITIONAL POSTERIOR MARGIN, EXCISION:  ?- Benign breast tissue.  ?- Final posterior margin negative for carcinoma.  ?  ?07/02/2021 Cancer Staging  ? Staging form: Breast, AJCC 8th Edition ?- Pathologic stage from 07/02/2021: Stage IA (pT1c, pN0, cM0, G1, ER+, PR+, HER2-, Oncotype DX score: 28) - Signed by Truitt Merle, MD on 07/21/2021 ?Stage prefix: Initial diagnosis ?Multigene prognostic tests  performed: Oncotype DX ?Recurrence score range: Greater than or equal to 11 ?Histologic grading system: 3 grade system ? ?  ? ? ? ?INTERVAL HISTORY:  ?Samantha Miranda is here for a follow up of breast cancer. She was last seen by NP Lacie on 10/12/21. She presents to the clinic alone. ?She reports she is doing much better off the exemestane. She reports she experienced depression, insomnia, and joint pain. ?She notes she just finish antibiotics for URI. ?  ?All other systems were reviewed with the patient and are  negative. ? ?MEDICAL HISTORY:  ?Past Medical History:  ?Diagnosis Date  ? Barrett's esophagus   ? Basal cell carcinoma of left lower leg   ? Breast cancer ([place])   ? right  ? COPD (chronic obstructive pulmonary diseas

## 2022-03-26 ENCOUNTER — Encounter: Payer: Self-pay | Admitting: Hematology

## 2022-04-25 ENCOUNTER — Encounter: Payer: Self-pay | Admitting: Family Medicine

## 2022-04-26 ENCOUNTER — Ambulatory Visit: Payer: Medicare PPO | Admitting: Family Medicine

## 2022-04-26 ENCOUNTER — Encounter: Payer: Self-pay | Admitting: Family Medicine

## 2022-04-26 VITALS — BP 110/80 | HR 78 | Temp 98.0°F | Resp 18 | Ht 62.0 in | Wt 118.4 lb

## 2022-04-26 DIAGNOSIS — M25511 Pain in right shoulder: Secondary | ICD-10-CM | POA: Diagnosis not present

## 2022-04-26 DIAGNOSIS — M25512 Pain in left shoulder: Secondary | ICD-10-CM | POA: Diagnosis not present

## 2022-04-26 NOTE — Patient Instructions (Signed)
Tendinitis  Tendinitis is inflammation of a tendon. A tendon is a strong cord of tissue that connects muscle to bone. Tendinitis can affect any tendon, but it most commonly affects the: Shoulder tendon (biceps tendon or rotator cuff). Ankle tendon (Achilles tendon). Elbow tendons. Tendons in the wrist. What are the causes? This condition may be caused by: Overusing a tendon or muscle. This is the most common cause. Age-related wear and tear. Injury. Inflammatory conditions, such as arthritis. Certain medicines. What increases the risk? You are more likely to develop this condition if you do activities that involve the same movements over and over again (repetitive motions). What are the signs or symptoms? Symptoms of this condition may include: Pain. Tenderness. Mild swelling. Decreased range of motion. How is this diagnosed? This condition is diagnosed with a physical exam. You may also have tests, such as: Ultrasound. This uses sound waves to make an image of the inside of your body in the affected area. MRI. This uses magnetic fields and radio waves to make an image of the inside of your body in the affected area. How is this treated? This condition may be treated by resting, icing, applying pressure (compression), and raising (elevating) the affected area above the level of your heart. This is known as RICE therapy. Treatment may also include: Medicines to help reduce inflammation or to help reduce pain. Exercises or physical therapy to improve movement and strength of the tendon. A brace or splint. Injection of corticosteroid medicine. This may be done in some cases. Surgery. This is rarely needed and only used if all other treatment has failed. Follow these instructions at home: If you have a removable splint or brace: Wear the splint or brace as told by your health care provider. Remove it only as told by your health care provider. Check the skin around the splint or brace  every day. Tell your health care provider about any concerns. Loosen the splint or brace if your fingers or toes tingle, become numb, or turn cold and blue. Keep the splint or brace clean. If the splint or brace is not waterproof: Do not let it get wet. Cover it with a watertight covering when you take a bath or shower, or remove it as told by your health care provider. Managing pain, stiffness, and swelling     If directed, put ice on the affected area. To do this: If you have a removable splint or brace, remove it as told by your health care provider. Put ice in a plastic bag. Place a towel between your skin and the bag. Leave the ice on for 20 minutes, 2-3 times a day. Remove the ice if your skin turns bright red. This is very important. If you cannot feel pain, heat, or cold, you have a greater risk of damage to the area. Move the fingers or toes of the affected limb often, if this applies. This can help to reduce stiffness and swelling. If directed, elevate the affected area above the level of your heart while you are sitting or lying down. If directed, apply heat to the affected area before you exercise. Use the heat source that your health care provider recommends, such as a moist heat pack or a heating pad. Place a towel between your skin and the heat source. Leave the heat on for 20-30 minutes. Remove the heat if your skin turns bright red. This is especially important if you are unable to feel pain, heat, or cold. You have a   greater risk of getting burned. Activity Rest the affected area as told by your health care provider. Ask your health care provider when it is safe to drive if you have a splint or brace on any part of your arm or leg. Return to your normal activities as told by your health care provider. Ask your health care provider what activities are safe for you. Avoid using the affected area while you are experiencing symptoms of tendinitis. Do exercises as told by your  health care provider. General instructions Wear an elastic bandage or compression wrap only as told by your health care provider. Take over-the-counter and prescription medicines only as told by your health care provider. Keep all follow-up visits. This is important. Contact a health care provider if: Your symptoms do not improve. You develop new, unexplained problems, such as numbness in your hands or feet. Summary Tendinitis is inflammation of a tendon. You are more likely to develop this condition if you do activities that involve the same movements over and over again. This condition may be treated by resting, icing, applying pressure (compression), and elevating the area above the level of your heart. This is known as RICE therapy. Avoid using the affected area while you are experiencing symptoms of tendinitis. This information is not intended to replace advice given to you by your health care provider. Make sure you discuss any questions you have with your health care provider. Document Revised: 07/15/2021 Document Reviewed: 07/15/2021 Elsevier Patient Education  2023 Elsevier Inc.  

## 2022-04-26 NOTE — Assessment & Plan Note (Signed)
Refer to ortho Pt has gabepentin at home  She did not want prednisone

## 2022-04-26 NOTE — Progress Notes (Addendum)
Subjective:   By signing my name below, I, Shehryar Baig, attest that this documentation has been prepared under the direction and in the presence of Ann Held, DO  04/26/2022    Patient ID: Samantha Miranda, female    DOB: 1946-05-28, 76 y.o.   MRN: [id number]  Chief Complaint  Patient presents with   Muscle Pain    Pt states having muscle pain in both arms. Pt states unable to lift arms. No falls or injury.    Muscle Pain Pertinent negatives include no chest pain, fever, headaches, rash, shortness of breath or vomiting.  Patient is in today for a office visit.   She complains of pain in both shoulders. Her right shoulder pain is worse than her left. Her pain radiates down her arms. Yesterday her pain was worse and she could not lift her bed sheets off her after waking up. She reports her pain started after attending exercise classes with her husband and doing band resistant exercises. She has pain while sleeping on either side.  She had tendonitis in the past in her right shoulder and reports her pain moved to her left shoulder as well during that time. She reports gabapentin helped her in the past.    Past Medical History:  Diagnosis Date   Barrett's esophagus    Basal cell carcinoma of left lower leg    Breast cancer (HCC)    right   COPD (chronic obstructive pulmonary disease) ([facility]) 2018   Depression 1989   Gastric ulcer    GERD (gastroesophageal reflux disease)    History of colon polyps    Hyperlipidemia    Osteopenia     Past Surgical History:  Procedure Laterality Date   ABDOMINAL HYSTERECTOMY  11/21/1989   APPENDECTOMY  1969   BREAST LUMPECTOMY  07/22/2006   BREAST LUMPECTOMY WITH RADIOACTIVE SEED AND SENTINEL LYMPH NODE BIOPSY Right 07/02/2021   Procedure: RIGHT BREAST LUMPECTOMY WITH RADIOACTIVE SEED AND RIGHT AXILLARY SENTINEL LYMPH NODE BIOPSY;  Surgeon: Rolm Bookbinder, MD;  Location: [facility];  Service: General;  Laterality: Right;   CATARACT  EXTRACTION, BILATERAL     EYE SURGERY  08/2013  10/2013   cataracts   TONSILLECTOMY      Family History  Problem Relation Age of Onset   Heart failure Mother        CHF   Hyperlipidemia Mother    Aortic stenosis Mother    Arthritis Mother    Heart disease Mother    Lung cancer Father 64   Cancer Father    Early death Father    Breast cancer Sister 85   Cancer Sister    Depression Sister    Stroke Maternal Aunt    Varicose Veins Maternal Aunt    Diabetes Maternal Grandmother    Hyperlipidemia Maternal Grandmother    Colon cancer Maternal Grandfather    Cancer Maternal Grandfather        colon cancer   Cancer Paternal Grandfather        unknown type cancer   Drug abuse Daughter    Heart attack Neg Hx    Hypertension Neg Hx    Sudden death Neg Hx    Esophageal cancer Neg Hx    Stomach cancer Neg Hx    Rectal cancer Neg Hx     Social History   Socioeconomic History   Marital status: Married    Spouse name: Not on file   Number of children: 1  Years of education: Not on file   Highest education level: Not on file  Occupational History   Occupation: retired    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 I smoked .5 pack a day. Before that, 1 pk. a day  Vaping Use   Vaping Use: Former   Substances: Nicotine  Substance and Sexual Activity   Alcohol use: No    Comment: occasional wine   Drug use: No   Sexual activity: Not Currently    Partners: Male    Birth control/protection: Post-menopausal  Other Topics Concern   Not on file  Social History Narrative   Exercise-no   Social Determinants of Health   Financial Resource Strain: Low Risk    Difficulty of Paying Living Expenses: Not hard at all   Food Insecurity: No Food Insecurity   Worried About Charity fundraiser in the Last Year: Never true   Arboriculturist in the Last Year: Never true  Transportation Needs: No Transportation Needs   Lack of Transportation (Medical): No   Lack of Transportation (Non-Medical): No  Physical Activity: Sufficiently Active   Days of Exercise per Week: 6 days   Minutes of Exercise per Session: 30 min  Stress: No Stress Concern Present   Feeling of Stress : Not at all  Social Connections: Moderately Isolated   Frequency of Communication with Friends and Family: More than three times a week   Frequency of Social Gatherings with Friends and Family: More than three times a week   Attends Religious Services: Never   Marine scientist or Organizations: No   Attends Music therapist: Never   Marital Status: Married  Human resources officer Violence: Not At Risk   Fear of Current or Ex-Partner: No   Emotionally Abused: No   Physically Abused: No   Sexually Abused: No    Outpatient Medications Prior to Visit  Medication Sig Dispense Refill   Biotin w/ Vitamins C & E (HAIR SKIN & NAILS GUMMIES PO) Take 2 each by mouth daily.     Calcium-Magnesium-Zinc (CAL-MAG-ZINC PO) Take 2 tablets by mouth daily.     cholecalciferol (VITAMIN D3) 25 MCG (1000 UNIT) tablet Take 1,000 Units by mouth daily.     Efinaconazole 10 % SOLN Apply 1 drop topically daily. 4 mL 11   ezetimibe (ZETIA) 10 MG tablet TAKE ONE TABLET BY MOUTH ONE TIME DAILY 90 tablet 1   fluticasone (FLONASE) 50 MCG/ACT nasal spray Place 2 sprays into both nostrils daily. (Patient taking differently: Place 2 sprays into both nostrils daily as needed for allergies.) 16 g 5   fluticasone-salmeterol (ADVAIR) 100-50 MCG/ACT AEPB Inhale 1 puff into the lungs 2 (two) times daily. 1 each 3   hydrocortisone (ANUSOL-HC) 2.5 % rectal cream Place 1 application rectally 2 (two) times daily as needed for hemorrhoids or anal itching. 30 g 1    loratadine (CLARITIN) 10 MG tablet Take 1 tablet (10 mg total) by mouth daily. 30 tablet 11   Omega-3 Fatty Acids (FISH OIL) 1200 MG CAPS Take 1,200 mg by mouth daily.     ondansetron (ZOFRAN ODT)  4 MG disintegrating tablet Take 1 tablet (4 mg total) by mouth every 8 (eight) hours as needed for nausea or vomiting. 20 tablet 0   pantoprazole (PROTONIX) 40 MG tablet Take 1 tablet (40 mg total) by mouth 2 (two) times daily. 180 tablet 3   promethazine (PHENERGAN) 25 MG tablet Take 1 tablet (25 mg total) by mouth every 8 (eight) hours as needed for nausea or vomiting. 20 tablet 0   tamoxifen (NOLVADEX) 10 MG tablet Take 1 tablet (10 mg total) by mouth 2 (two) times daily. 30 tablet 0   vitamin C (ASCORBIC ACID) 250 MG tablet Take 250 mg by mouth daily.     XIIDRA 5 % SOLN Place 1 drop into both eyes in the morning and at bedtime.  1   zolpidem (AMBIEN) 5 MG tablet Take 1 tablet (5 mg total) by mouth at bedtime as needed for sleep. 30 tablet 1   ZYPITAMAG 2 MG TABS Take 2 mg by mouth daily. 90 tablet 0   azithromycin (ZITHROMAX Z-PAK) 250 MG tablet As directed (Patient not taking: Reported on 04/26/2022) 6 each 0   COVID-19 mRNA bivalent vaccine, Pfizer, injection Inject into the muscle. (Patient not taking: Reported on 03/21/2022) 0.3 mL 0   promethazine-dextromethorphan (PROMETHAZINE-DM) 6.25-15 MG/5ML syrup TAKE FIVE ML BY MOUTH FOUR TIMES A DAY AS NEEDED (Patient not taking: Reported on 04/26/2022) 118 mL 0   No facility-administered medications prior to visit.    Allergies  Allergen Reactions   Amoxicillin-Pot Clavulanate Nausea And Vomiting   Moxifloxacin Nausea And Vomiting    Review of Systems  Constitutional:  Negative for fever and malaise/fatigue.  HENT:  Negative for congestion.   Eyes:  Negative for blurred vision.  Respiratory:  Negative for cough and shortness of breath.   Cardiovascular:  Negative for chest pain, palpitations and leg swelling.  Gastrointestinal:  Negative for  vomiting.  Musculoskeletal:  Positive for joint pain (bilateral shoulders (R>L)). Negative for back pain.       (+)pain in bilateral arms  Skin:  Negative for rash.  Neurological:  Negative for loss of consciousness and headaches.      Objective:    Physical Exam Vitals and nursing note reviewed.  Constitutional:      General: She is not in acute distress.    Appearance: Normal appearance. She is not ill-appearing.  HENT:     Head: Normocephalic and atraumatic.     Right Ear: External ear normal.     Left Ear: External ear normal.  Eyes:     Extraocular Movements: Extraocular movements intact.     Pupils: Pupils are equal, round, and reactive to light.  Cardiovascular:     Rate and Rhythm: Normal rate and regular rhythm.     Heart sounds: Normal heart sounds. No murmur heard.   No gallop.  Pulmonary:     Effort: Pulmonary effort is normal. No respiratory distress.     Breath sounds: Normal breath sounds. No wheezing or rales.  Musculoskeletal:     Comments: Less then 90 degree ROM on left shoulder Less than 45 degree ROM on right shoulder  Skin:    General: Skin is warm and dry.  Neurological:     Mental Status: She is alert and oriented to person, place, and time.  Psychiatric:        Judgment: Judgment normal.    BP 110/80 (BP Location: Left Arm, Patient Position: Sitting, Cuff Size: Normal)   Pulse 78   Temp  98 F (36.7 C) (Oral)   Resp 18   Ht '5\' 2"'$  (1.575 m)   Wt 118 lb 6.4 oz (53.7 kg)   SpO2 100%   BMI 21.66 kg/m  Wt Readings from Last 3 Encounters:  04/26/22 118 lb 6.4 oz (53.7 kg)  03/25/22 119 lb 2 oz (54 kg)  03/21/22 119 lb 6.4 oz (54.2 kg)    Diabetic Foot Exam - Simple   No data filed    Lab Results  Component Value Date   WBC 5.3 03/25/2022   HGB 13.3 03/25/2022   HCT 40.0 03/25/2022   PLT 169 03/25/2022   GLUCOSE 85 03/25/2022   CHOL 147 03/21/2022   TRIG 76.0 03/21/2022   HDL 70.50 03/21/2022   LDLDIRECT 176.2 08/12/2013   LDLCALC  61 03/21/2022   ALT 26 03/25/2022   AST 30 03/25/2022   NA 140 03/25/2022   K 3.9 03/25/2022   CL 106 03/25/2022   CREATININE 0.79 03/25/2022   BUN 18 03/25/2022   CO2 28 03/25/2022   TSH 1.07 08/04/2017   HGBA1C 5.1 08/04/2017   MICROALBUR 1.1 08/12/2013    Lab Results  Component Value Date   TSH 1.07 08/04/2017   Lab Results  Component Value Date   WBC 5.3 03/25/2022   HGB 13.3 03/25/2022   HCT 40.0 03/25/2022   MCV 91.3 03/25/2022   PLT 169 03/25/2022   Lab Results  Component Value Date   NA 140 03/25/2022   K 3.9 03/25/2022   CO2 28 03/25/2022   GLUCOSE 85 03/25/2022   BUN 18 03/25/2022   CREATININE 0.79 03/25/2022   BILITOT 0.5 03/25/2022   ALKPHOS 51 03/25/2022   AST 30 03/25/2022   ALT 26 03/25/2022   PROT 7.4 03/25/2022   ALBUMIN 4.1 03/25/2022   CALCIUM 9.2 03/25/2022   ANIONGAP 6 03/25/2022   GFR 53.37 (L) 03/21/2022   Lab Results  Component Value Date   CHOL 147 03/21/2022   Lab Results  Component Value Date   HDL 70.50 03/21/2022   Lab Results  Component Value Date   LDLCALC 61 03/21/2022   Lab Results  Component Value Date   TRIG 76.0 03/21/2022   Lab Results  Component Value Date   CHOLHDL 2 03/21/2022   Lab Results  Component Value Date   HGBA1C 5.1 08/04/2017       Assessment & Plan:   Problem List Items Addressed This Visit       Unprioritized   Acute pain of both shoulders - Primary    Refer to ortho Pt has gabepentin at home  She did not want prednisone        Relevant Orders   Ambulatory referral to Orthopedic Surgery     No orders of the defined types were placed in this encounter.   IAnn Held, DO, personally preformed the services described in this documentation.  All medical record entries made by the scribe were at my direction and in my presence.  I have reviewed the chart and discharge instructions (if applicable) and agree that the record reflects my personal performance and is accurate  and complete. 04/26/2022   I,Shehryar Baig,acting as a scribe for Ann Held, DO.,have documented all relevant documentation on the behalf of Ann Held, DO,as directed by  Ann Held, DO while in the presence of Ann Held, DO.   Ann Held, DO

## 2022-04-29 ENCOUNTER — Encounter: Payer: Self-pay | Admitting: Orthopedic Surgery

## 2022-04-29 ENCOUNTER — Ambulatory Visit: Payer: Self-pay

## 2022-04-29 ENCOUNTER — Ambulatory Visit (INDEPENDENT_AMBULATORY_CARE_PROVIDER_SITE_OTHER): Payer: Medicare PPO

## 2022-04-29 ENCOUNTER — Ambulatory Visit: Payer: Medicare PPO | Admitting: Orthopedic Surgery

## 2022-04-29 DIAGNOSIS — M25511 Pain in right shoulder: Secondary | ICD-10-CM

## 2022-04-29 DIAGNOSIS — M7552 Bursitis of left shoulder: Secondary | ICD-10-CM

## 2022-04-29 DIAGNOSIS — M7551 Bursitis of right shoulder: Secondary | ICD-10-CM

## 2022-04-29 DIAGNOSIS — M25512 Pain in left shoulder: Secondary | ICD-10-CM

## 2022-05-14 MED ORDER — METHYLPREDNISOLONE ACETATE 40 MG/ML IJ SUSP
40.0000 mg | INTRAMUSCULAR | Status: AC | PRN
  Administered 2022-04-29: 40 mg via INTRA_ARTICULAR

## 2022-05-14 MED ORDER — LIDOCAINE HCL 1 % IJ SOLN
5.0000 mL | INTRAMUSCULAR | Status: AC | PRN
  Administered 2022-04-29: 5 mL

## 2022-05-14 MED ORDER — BUPIVACAINE HCL 0.5 % IJ SOLN
9.0000 mL | INTRAMUSCULAR | Status: AC | PRN
  Administered 2022-04-29: 9 mL via INTRA_ARTICULAR

## 2022-05-23 ENCOUNTER — Ambulatory Visit (INDEPENDENT_AMBULATORY_CARE_PROVIDER_SITE_OTHER): Payer: Medicare PPO

## 2022-05-23 ENCOUNTER — Ambulatory Visit: Payer: Medicare PPO | Admitting: Orthopedic Surgery

## 2022-05-23 ENCOUNTER — Ambulatory Visit: Payer: Self-pay

## 2022-05-23 DIAGNOSIS — M79641 Pain in right hand: Secondary | ICD-10-CM

## 2022-05-23 DIAGNOSIS — M79642 Pain in left hand: Secondary | ICD-10-CM

## 2022-05-28 ENCOUNTER — Encounter: Payer: Self-pay | Admitting: Orthopedic Surgery

## 2022-05-28 NOTE — Progress Notes (Signed)
Office Visit Note   Patient: Samantha Miranda           Date of Birth: 1946/05/17           MRN: [id number] Visit Date: 05/23/2022 Requested by: Carollee Herter, [place], [place] [address] [place],  [place] [address] PCP: Carollee Herter, Alferd Apa, DO  Subjective: Chief Complaint  Patient presents with   Right Shoulder - Pain   Left Shoulder - Follow-up   Right Hand - Pain   Left Hand - Pain    HPI: Haruye is a 76 year old patient with bilateral shoulder pain.  She had right shoulder subacromial injection on 04/29/2022.  Had good relief from that.  Does report some left shoulder pain as well.  She also describes bilateral hand pain.  She states "I have lumps on my hands".  Denies any history of injury.  Been ongoing for 1 to 2 weeks.  Denies any numbness and tingling in the hands.  She is right-hand dominant.              ROS: All systems reviewed are negative as they relate to the chief complaint within the history of present illness.  Patient denies  fevers or chills.   Assessment & Plan: Visit Diagnoses:  1. Bilateral hand pain     Plan: Impression is bilateral hand pain with fairly minimal arthritis seen on plain radiographs.  Very mild amount of arthritis at the left [facility]: Lambert Rhonda W joint but functionally she is intact.  She does have some early Heberden's and Bouchard's nodes but no corresponding severe arthritis in the phalangeal region.  I think this is something for her to manage with conservative treatment measures including topicals as well as range of motion exercises.  Left subacromial space injected today based on good response to right shoulder subacromial injection.  Follow-up as needed.  Follow-Up Instructions: Return if symptoms worsen or fail to improve.   Orders:  Orders Placed This Encounter  Procedures   XR Hand Complete Right   XR Hand Complete Left   No orders of the defined types were placed in this encounter.     Procedures: No procedures  performed   Clinical Data: No additional findings.  Objective: Vital Signs: There were no vitals taken for this visit.  Physical Exam:   Constitutional: Patient appears well-developed HEENT:  Head: Normocephalic Eyes:EOM are normal Neck: Normal range of motion Cardiovascular: Normal rate Pulmonary/chest: Effort normal Neurologic: Patient is alert Skin: Skin is warm Psychiatric: Patient has normal mood and affect   Ortho Exam: Ortho exam demonstrates negative Finkelstein's and negative grind test for [facility] arthritis both hands.  She is able to achieve full composite flexion and extension of all digits.  Flexor and extensor tendons are fully functional in both hands.  Radial pulse intact.  No paresthesias on the dorsal or palmar aspect of either hand.  No masses lymphadenopathy or skin changes noted in the hand region.  Left shoulder demonstrates excellent passive range of motion with good rotator cuff strength infraspinatus supraspinatus subscap muscle testing.  No masses lymphadenopathy or skin changes noted in that shoulder girdle region.  Impingement signs are positive AC joint tenderness not present. Specialty Comments:  No specialty comments available.  Imaging: No results found.   PMFS History: Patient Active Problem List   Diagnosis Date Noted   Acute pain of both shoulders 04/26/2022   Bunion, right 02/07/2022   DOE (dyspnea on exertion) 12/20/2021   Pulmonary emphysema,  unspecified emphysema type ([facility]) 12/20/2021   Atherosclerosis of aorta ([facility]) 12/20/2021   Gastroenteritis 08/23/2021   Preventative health care 07/22/2021   Insomnia 07/22/2021   History of peptic ulcer 07/22/2021   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 ([facility]) 06/23/2021   Depression, major, single episode, in partial remission ([facility]) 06/11/2019   Influenza vaccine administered 11/02/2015   Otitis media 08/10/2015   Acute bronchitis 04/21/2015   Right ankle pain 02/10/2014   Left  shoulder pain 11/22/2013   Right shoulder pain 08/20/2013   Near syncope 08/12/2013   Other malaise and fatigue 08/12/2013   Rectal itching 07/01/2013   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   Gastroesophageal reflux disease 08/12/2012   Former heavy tobacco smoker 08/26/2010   COLONIC POLYPS, HX OF 08/26/2010   POSTMENOPAUSAL STATUS 08/26/2010   POSTMENOPAUSAL BLEEDING 04/13/2009   Hyperlipidemia 07/11/2007   DEPRESSION 07/11/2007   OSTEOPENIA 07/11/2007   BREAST CANCER, HX OF 07/11/2007   Past Medical History:  Diagnosis Date   Barrett's esophagus    Basal cell carcinoma of left lower leg    Breast cancer ([facility])    right   COPD (chronic obstructive pulmonary disease) ([facility]) 2018   Depression 1989   Gastric ulcer    GERD (gastroesophageal reflux disease)    History of colon polyps    Hyperlipidemia    Osteopenia     Family History  Problem Relation Age of Onset   Heart failure Mother        CHF   Hyperlipidemia Mother    Aortic stenosis Mother    Arthritis Mother    Heart disease Mother    Lung cancer Father 49   Cancer Father    Early death Father    Breast cancer Sister 30   Cancer Sister    Depression Sister    Stroke Maternal Aunt    Varicose Veins Maternal Aunt    Diabetes Maternal Grandmother    Hyperlipidemia Maternal Grandmother    Colon cancer Maternal Grandfather    Cancer Maternal Grandfather        colon cancer   Cancer Paternal Grandfather        unknown type cancer   Drug abuse Daughter    Heart attack Neg Hx    Hypertension Neg Hx    Sudden death Neg Hx    Esophageal cancer Neg Hx    Stomach cancer Neg Hx    Rectal cancer Neg Hx     Past Surgical History:  Procedure Laterality Date   ABDOMINAL HYSTERECTOMY  11/21/1989   APPENDECTOMY  1969   BREAST LUMPECTOMY  07/22/2006   BREAST LUMPECTOMY WITH RADIOACTIVE SEED AND SENTINEL LYMPH NODE BIOPSY Right 07/02/2021   Procedure: RIGHT BREAST LUMPECTOMY WITH RADIOACTIVE SEED AND RIGHT AXILLARY SENTINEL  LYMPH NODE BIOPSY;  Surgeon: Rolm Bookbinder, MD;  Location: [facility];  Service: General;  Laterality: Right;   CATARACT EXTRACTION, BILATERAL     EYE SURGERY  08/2013  10/2013   cataracts   TONSILLECTOMY     Social History   Occupational History   Occupation: retired    Fish farm manager: [employer]    Comment: retired  Tobacco Use   Smoking status: Former    Packs/day: 0.50    Years: 40.00    Total pack years: 20.00    Types: Cigarettes, E-cigarettes    Quit date: 07/23/2011    Years since quitting: 10.8   Smokeless tobacco: Never   Tobacco comments:    The last  5 yrs. I smoked .5 pack a day. Before that, 1 pk. a day  Vaping Use   Vaping Use: Former   Substances: Nicotine  Substance and Sexual Activity   Alcohol use: No    Comment: occasional wine   Drug use: No   Sexual activity: Not Currently    Partners: Male    Birth control/protection: Post-menopausal

## 2022-05-30 ENCOUNTER — Ambulatory Visit: Payer: Medicare PPO | Admitting: Orthopedic Surgery

## 2022-05-31 ENCOUNTER — Ambulatory Visit: Payer: Medicare PPO

## 2022-06-13 ENCOUNTER — Other Ambulatory Visit: Payer: Self-pay | Admitting: Family Medicine

## 2022-06-13 DIAGNOSIS — R053 Chronic cough: Secondary | ICD-10-CM

## 2022-06-20 ENCOUNTER — Encounter: Payer: Self-pay | Admitting: Family Medicine

## 2022-06-20 ENCOUNTER — Telehealth (INDEPENDENT_AMBULATORY_CARE_PROVIDER_SITE_OTHER): Payer: Medicare PPO | Admitting: Family Medicine

## 2022-06-20 DIAGNOSIS — U071 COVID-19: Secondary | ICD-10-CM | POA: Diagnosis not present

## 2022-06-20 MED ORDER — NIRMATRELVIR/RITONAVIR (PAXLOVID)TABLET: 3.0000 | ORAL_TABLET | Freq: Two times a day (BID) | ORAL | 0 refills | Status: AC

## 2022-06-20 MED ORDER — PROMETHAZINE-DM 6.25-15 MG/5ML PO SYRP: 5.0000 mL | ORAL_SOLUTION | Freq: Four times a day (QID) | ORAL | 0 refills | Status: DC | PRN

## 2022-06-20 NOTE — Progress Notes (Signed)
Virtual telephone visit    Virtual Visit via Telephone Note   This visit type was conducted due to national recommendations for restrictions regarding the COVID-19 Pandemic (e.g. social distancing) in an effort to limit this patient's exposure and mitigate transmission in our community. Due to her co-morbid illnesses, this patient is at least at moderate risk for complications without adequate follow up. This format is felt to be most appropriate for this patient at this time. The patient did not have access to video technology or had technical difficulties with video requiring transitioning to audio format only (telephone). Physical exam was limited to content and character of the telephone converstion. [name] was able to get the patient set up on a telephone visit.   Patient location: home Patient and provider in visit Provider location: Office  I discussed the limitations of evaluation and management by telemedicine and the availability of in person appointments. The patient expressed understanding and agreed to proceed.   Visit Date: 06/20/2022  Today's healthcare provider: Ann Held, DO     Subjective:    Patient ID: Samantha Miranda, female    DOB: [birth year], 76 y.o.   MRN: [id number]  Chief Complaint  Patient presents with   Covid Positive    Pt states testing positive for COVID yesterday and tested positive today as well.     HPI Patient is in today for +covid----- her symptoms started yesterday and she tested positive.    + body aches and sore throat and fever 100.6    she is taking Ibuprofen.  + coughing-- -dry  No difficulty breathing.       Past Medical History:  Diagnosis Date   Barrett's esophagus    Basal cell carcinoma of left lower leg    Breast cancer (HCC)    right   COPD (chronic obstructive pulmonary disease) ([place]) 2018   Depression 1989   Gastric ulcer    GERD (gastroesophageal reflux disease)    History of colon polyps     Hyperlipidemia    Osteopenia     Past Surgical History:  Procedure Laterality Date   ABDOMINAL HYSTERECTOMY  11/21/1989   APPENDECTOMY  1969   BREAST LUMPECTOMY  07/22/2006   BREAST LUMPECTOMY WITH RADIOACTIVE SEED AND SENTINEL LYMPH NODE BIOPSY Right 07/02/2021   Procedure: RIGHT BREAST LUMPECTOMY WITH RADIOACTIVE SEED AND RIGHT AXILLARY SENTINEL LYMPH NODE BIOPSY;  Surgeon: Rolm Bookbinder, MD;  Location: [facility];  Service: General;  Laterality: Right;   CATARACT EXTRACTION, BILATERAL     EYE SURGERY  08/2013  10/2013   cataracts   TONSILLECTOMY      Family History  Problem Relation Age of Onset   Heart failure Mother        CHF   Hyperlipidemia Mother    Aortic stenosis Mother    Arthritis Mother    Heart disease Mother    Lung cancer Father 54   Cancer Father    Early death Father    Breast cancer Sister 27   Cancer Sister    Depression Sister    Stroke Maternal Aunt    Varicose Veins Maternal Aunt    Diabetes Maternal Grandmother    Hyperlipidemia Maternal Grandmother    Colon cancer Maternal Grandfather    Cancer Maternal Grandfather        colon cancer   Cancer Paternal Grandfather        unknown type cancer   Drug abuse Daughter    Heart  attack Neg Hx    Hypertension Neg Hx    Sudden death Neg Hx    Esophageal cancer Neg Hx    Stomach cancer Neg Hx    Rectal cancer Neg Hx     Social History   Socioeconomic History   Marital status: Married    Spouse name: Not on file   Number of children: 1   Years of education: Not on file   Highest education level: Not on file  Occupational History   Occupation: retired    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 I smoked .5 pack a day.  Before that, 1 pk. a day  Vaping Use   Vaping Use: Former   Substances: Nicotine  Substance and Sexual Activity   Alcohol use: No    Comment: occasional wine   Drug use: No   Sexual activity: Not Currently    Partners: Male    Birth control/protection: Post-menopausal  Other Topics Concern   Not on file  Social History Narrative   Exercise-no   Social Determinants of Health   Financial Resource Strain: Low Risk  (05/25/2021)   Overall Financial Resource Strain (CARDIA)    Difficulty of Paying Living Expenses: Not hard at all  Food Insecurity: No Food Insecurity (05/25/2021)   Hunger Vital Sign    Worried About Running Out of Food in the Last Year: Never true    Ran Out of Food in the Last Year: Never true  Transportation Needs: No Transportation Needs (05/25/2021)   PRAPARE - Hydrologist (Medical): No    Lack of Transportation (Non-Medical): No  Physical Activity: Sufficiently Active (05/25/2021)   Exercise Vital Sign    Days of Exercise per Week: 6 days    Minutes of Exercise per Session: 30 min  Stress: No Stress Concern Present (05/25/2021)   [place]    Feeling of Stress : Not at all  Social Connections: Moderately Isolated (05/25/2021)   Social Connection and Isolation Panel [NHANES]    Frequency of Communication with Friends and Family: More than three times a week    Frequency of Social Gatherings with Friends and Family: More than three times a week    Attends Religious Services: Never    Marine scientist or Organizations: No    Attends Archivist Meetings: Never    Marital Status: Married  Human resources officer Violence: Not At Risk (05/25/2021)   Humiliation, Afraid, Rape, and Kick questionnaire    Fear of Current or Ex-Partner: No    Emotionally Abused: No    Physically Abused: No    Sexually Abused: No    Outpatient Medications Prior to Visit  Medication Sig  Dispense Refill   Biotin w/ Vitamins C & E (HAIR SKIN & NAILS GUMMIES PO) Take 2 each by mouth daily.     Calcium-Magnesium-Zinc (CAL-MAG-ZINC PO) Take 2 tablets by mouth daily.     cholecalciferol (VITAMIN D3) 25 MCG (1000 UNIT) tablet Take 1,000 Units by mouth daily.     Efinaconazole 10 % SOLN Apply 1 drop  topically daily. 4 mL 11   ezetimibe (ZETIA) 10 MG tablet TAKE ONE TABLET BY MOUTH ONE TIME DAILY 90 tablet 1   fluticasone (FLONASE) 50 MCG/ACT nasal spray Place 2 sprays into both nostrils daily. (Patient taking differently: Place 2 sprays into both nostrils daily as needed for allergies.) 16 g 5   fluticasone-salmeterol (ADVAIR) 100-50 MCG/ACT AEPB INHALE ONE PUFF INTO THE LUNGS BY MOUTH TWICE A DAY 60 each 5   hydrocortisone (ANUSOL-HC) 2.5 % rectal cream Place 1 application rectally 2 (two) times daily as needed for hemorrhoids or anal itching. 30 g 1   loratadine (CLARITIN) 10 MG tablet Take 1 tablet (10 mg total) by mouth daily. 30 tablet 11   Omega-3 Fatty Acids (FISH OIL) 1200 MG CAPS Take 1,200 mg by mouth daily.     ondansetron (ZOFRAN ODT) 4 MG disintegrating tablet Take 1 tablet (4 mg total) by mouth every 8 (eight) hours as needed for nausea or vomiting. 20 tablet 0   pantoprazole (PROTONIX) 40 MG tablet Take 1 tablet (40 mg total) by mouth 2 (two) times daily. 180 tablet 3   promethazine (PHENERGAN) 25 MG tablet Take 1 tablet (25 mg total) by mouth every 8 (eight) hours as needed for nausea or vomiting. 20 tablet 0   tamoxifen (NOLVADEX) 10 MG tablet Take 1 tablet (10 mg total) by mouth 2 (two) times daily. 30 tablet 0   vitamin C (ASCORBIC ACID) 250 MG tablet Take 250 mg by mouth daily.     XIIDRA 5 % SOLN Place 1 drop into both eyes in the morning and at bedtime.  1   zolpidem (AMBIEN) 5 MG tablet Take 1 tablet (5 mg total) by mouth at bedtime as needed for sleep. 30 tablet 1   ZYPITAMAG 2 MG TABS Take 2 mg by mouth daily. 90 tablet 0   No facility-administered medications  prior to visit.    Allergies  Allergen Reactions   Amoxicillin-Pot Clavulanate Nausea And Vomiting   Moxifloxacin Nausea And Vomiting    Review of Systems  Constitutional:  Positive for chills and fever. Negative for malaise/fatigue.  HENT:  Positive for sore throat. Negative for congestion.   Eyes:  Negative for blurred vision.  Respiratory:  Positive for cough. Negative for sputum production and shortness of breath.   Cardiovascular:  Negative for chest pain, palpitations and leg swelling.  Gastrointestinal:  Negative for abdominal pain, blood in stool and nausea.  Genitourinary:  Negative for dysuria and frequency.  Musculoskeletal:  Positive for myalgias. Negative for falls.  Skin:  Negative for rash.  Neurological:  Negative for dizziness, loss of consciousness and headaches.  Endo/Heme/Allergies:  Negative for environmental allergies.  Psychiatric/Behavioral:  Negative for depression. The patient is not nervous/anxious.        Objective:    Physical Exam Vitals and nursing note reviewed.  Constitutional:      General: She is not in acute distress.    Appearance: She is normal weight. She is not ill-appearing, toxic-appearing or diaphoretic.  Pulmonary:     Effort: Pulmonary effort is normal.  Psychiatric:        Mood and Affect: Mood normal.        Behavior: Behavior normal.        Thought Content: Thought content normal.     There were no vitals taken for this visit. Wt Readings from Last 3 Encounters:  04/26/22 118 lb 6.4 oz (53.7 kg)  03/25/22 119 lb 2 oz (54 kg)  03/21/22 119 lb 6.4 oz (54.2 kg)    Diabetic Foot Exam - Simple   No data filed    Lab Results  Component Value Date   WBC 5.3 03/25/2022   HGB 13.3 03/25/2022   HCT 40.0 03/25/2022   PLT 169 03/25/2022   GLUCOSE 85 03/25/2022   CHOL 147 03/21/2022   TRIG 76.0 03/21/2022   HDL 70.50 03/21/2022   LDLDIRECT 176.2 08/12/2013   LDLCALC 61 03/21/2022   ALT 26 03/25/2022   AST 30  03/25/2022   NA 140 03/25/2022   K 3.9 03/25/2022   CL 106 03/25/2022   CREATININE 0.79 03/25/2022   BUN 18 03/25/2022   CO2 28 03/25/2022   TSH 1.07 08/04/2017   HGBA1C 5.1 08/04/2017   MICROALBUR 1.1 08/12/2013    Lab Results  Component Value Date   TSH 1.07 08/04/2017   Lab Results  Component Value Date   WBC 5.3 03/25/2022   HGB 13.3 03/25/2022   HCT 40.0 03/25/2022   MCV 91.3 03/25/2022   PLT 169 03/25/2022   Lab Results  Component Value Date   NA 140 03/25/2022   K 3.9 03/25/2022   CO2 28 03/25/2022   GLUCOSE 85 03/25/2022   BUN 18 03/25/2022   CREATININE 0.79 03/25/2022   BILITOT 0.5 03/25/2022   ALKPHOS 51 03/25/2022   AST 30 03/25/2022   ALT 26 03/25/2022   PROT 7.4 03/25/2022   ALBUMIN 4.1 03/25/2022   CALCIUM 9.2 03/25/2022   ANIONGAP 6 03/25/2022   GFR 53.37 (L) 03/21/2022   Lab Results  Component Value Date   CHOL 147 03/21/2022   Lab Results  Component Value Date   HDL 70.50 03/21/2022   Lab Results  Component Value Date   LDLCALC 61 03/21/2022   Lab Results  Component Value Date   TRIG 76.0 03/21/2022   Lab Results  Component Value Date   CHOLHDL 2 03/21/2022   Lab Results  Component Value Date   HGBA1C 5.1 08/04/2017       Assessment & Plan:   Problem List Items Addressed This Visit       Unprioritized   COVID-19 - Primary    Anti viral sent to pharmacy  Cough med Quarantine 5 days and then may leave house with mask for 5 more days  If any cp or sob  Go to er       Relevant Medications   nirmatrelvir/ritonavir EUA (PAXLOVID) 20 x 150 MG & 10 x '100MG'$  TABS   promethazine-dextromethorphan (PROMETHAZINE-DM) 6.25-15 MG/5ML syrup    I am having [name] start on nirmatrelvir/ritonavir EUA and promethazine-dextromethorphan. I am also having her maintain her cholecalciferol, Xiidra, fluticasone, vitamin C, Calcium-Magnesium-Zinc (CAL-MAG-ZINC PO), Biotin w/ Vitamins C & E (HAIR SKIN & NAILS GUMMIES PO), Fish Oil,  zolpidem, pantoprazole, ondansetron, hydrocortisone, Efinaconazole, loratadine, promethazine, ezetimibe, Zypitamag, tamoxifen, and fluticasone-salmeterol.  Meds ordered this encounter  Medications   nirmatrelvir/ritonavir EUA (PAXLOVID) 20 x 150 MG & 10 x '100MG'$  TABS    Sig: Take 3 tablets by mouth 2 (two) times daily for 5 days. (Take nirmatrelvir 150 mg two tablets twice daily for 5 days and ritonavir 100 mg one tablet twice daily for 5 days) Patient GFR is >60    Dispense:  30 tablet    Refill:  0   promethazine-dextromethorphan (PROMETHAZINE-DM) 6.25-15 MG/5ML syrup    Sig: Take 5 mLs by mouth 4 (four) times daily as needed.    Dispense:  118 mL  Refill:  0     I discussed the assessment and treatment plan with the patient. The patient was provided an opportunity to ask questions and all were answered. The patient agreed with the plan and demonstrated an understanding of the instructions.   The patient was advised to call back or seek an in-person evaluation if the symptoms worsen or if the condition fails to improve as anticipated.     Ann Held, DO [facility] [phone] (phone) [phone] (fax)  [place]

## 2022-06-20 NOTE — Progress Notes (Signed)
MyChart Video Visit    Virtual Visit via Video Note   This visit type was conducted due to national recommendations for restrictions regarding the COVID-19 Pandemic (e.g. social distancing) in an effort to limit this patient's exposure and mitigate transmission in our community. This patient is at least at moderate risk for complications without adequate follow up. This format is felt to be most appropriate for this patient at this time. Physical exam was limited by quality of the video and audio technology used for the visit. Samantha Miranda was able to get the patient set up on a video visit.  Patient location: Home Patient and provider in visit Provider location: Office  I discussed the limitations of evaluation and management by telemedicine and the availability of in person appointments. The patient expressed understanding and agreed to proceed.  Visit Date: 06/20/2022  Today's healthcare provider: Ann Held, DO     Subjective:    Patient ID: Samantha Miranda, female    DOB: Jul 01, 1946, 76 y.o.   MRN: [id number]  No chief complaint on file.   HPI Patient is in today for a virtual office visit   Past Medical History:  Diagnosis Date   Barrett's esophagus    Basal cell carcinoma of left lower leg    Breast cancer ([place])    right   COPD (chronic obstructive pulmonary disease) ([place]) 2018   Depression 1989   Gastric ulcer    GERD (gastroesophageal reflux disease)    History of colon polyps    Hyperlipidemia    Osteopenia     Past Surgical History:  Procedure Laterality Date   ABDOMINAL HYSTERECTOMY  11/21/1989   APPENDECTOMY  1969   BREAST LUMPECTOMY  07/22/2006   BREAST LUMPECTOMY WITH RADIOACTIVE SEED AND SENTINEL LYMPH NODE BIOPSY Right 07/02/2021   Procedure: RIGHT BREAST LUMPECTOMY WITH RADIOACTIVE SEED AND RIGHT AXILLARY SENTINEL LYMPH NODE BIOPSY;  Surgeon: Rolm Bookbinder, MD;  Location: [facility];  Service: General;  Laterality: Right;    CATARACT EXTRACTION, BILATERAL     EYE SURGERY  08/2013  10/2013   cataracts   TONSILLECTOMY      Family History  Problem Relation Age of Onset   Heart failure Mother        CHF   Hyperlipidemia Mother    Aortic stenosis Mother    Arthritis Mother    Heart disease Mother    Lung cancer Father 47   Cancer Father    Early death Father    Breast cancer Sister 40   Cancer Sister    Depression Sister    Stroke Maternal Aunt    Varicose Veins Maternal Aunt    Diabetes Maternal Grandmother    Hyperlipidemia Maternal Grandmother    Colon cancer Maternal Grandfather    Cancer Maternal Grandfather        colon cancer   Cancer Paternal Grandfather        unknown type cancer   Drug abuse Daughter    Heart attack Neg Hx    Hypertension Neg Hx    Sudden death Neg Hx    Esophageal cancer Neg Hx    Stomach cancer Neg Hx    Rectal cancer Neg Hx     Social History   Socioeconomic History   Marital status: Married    Spouse name: Not on file   Number of children: 1   Years of education: Not on file   Highest education level: Not on file  Occupational  History   Occupation: retired    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 I smoked .5 pack a day. Before that, 1 pk. a day  Vaping Use   Vaping Use: Former   Substances: Nicotine  Substance and Sexual Activity   Alcohol use: No    Comment: occasional wine   Drug use: No   Sexual activity: Not Currently    Partners: Male    Birth control/protection: Post-menopausal  Other Topics Concern   Not on file  Social History Narrative   Exercise-no   Social Determinants of Health   Financial Resource Strain: Low Risk  (05/25/2021)   Overall Financial Resource  Strain (CARDIA)    Difficulty of Paying Living Expenses: Not hard at all  Food Insecurity: No Food Insecurity (05/25/2021)   Hunger Vital Sign    Worried About Running Out of Food in the Last Year: Never true    Ran Out of Food in the Last Year: Never true  Transportation Needs: No Transportation Needs (05/25/2021)   PRAPARE - Hydrologist (Medical): No    Lack of Transportation (Non-Medical): No  Physical Activity: Sufficiently Active (05/25/2021)   Exercise Vital Sign    Days of Exercise per Week: 6 days    Minutes of Exercise per Session: 30 min  Stress: No Stress Concern Present (05/25/2021)   [place]    Feeling of Stress : Not at all  Social Connections: Moderately Isolated (05/25/2021)   Social Connection and Isolation Panel [NHANES]    Frequency of Communication with Friends and Family: More than three times a week    Frequency of Social Gatherings with Friends and Family: More than three times a week    Attends Religious Services: Never    Marine scientist or Organizations: No    Attends Archivist Meetings: Never    Marital Status: Married  Human resources officer Violence: Not At Risk (05/25/2021)   Humiliation, Afraid, Rape, and Kick questionnaire    Fear of Current or Ex-Partner: No    Emotionally Abused: No    Physically Abused: No    Sexually Abused: No    Outpatient Medications Prior to Visit  Medication Sig Dispense Refill   Biotin w/ Vitamins C & E (HAIR SKIN & NAILS GUMMIES PO) Take 2 each by mouth daily.     Calcium-Magnesium-Zinc (CAL-MAG-ZINC PO) Take 2 tablets by mouth daily.     cholecalciferol (VITAMIN D3) 25 MCG (1000 UNIT) tablet Take 1,000 Units by mouth daily.     Efinaconazole 10 % SOLN Apply 1 drop topically daily. 4 mL 11   ezetimibe (ZETIA) 10 MG tablet TAKE ONE TABLET BY MOUTH ONE TIME DAILY 90 tablet 1   fluticasone (FLONASE) 50 MCG/ACT nasal spray  Place 2 sprays into both nostrils daily. (Patient taking differently: Place 2 sprays into both nostrils daily as needed for allergies.) 16 g 5   fluticasone-salmeterol (ADVAIR) 100-50 MCG/ACT AEPB INHALE ONE PUFF INTO THE LUNGS BY MOUTH TWICE A DAY 60 each 5   hydrocortisone (ANUSOL-HC) 2.5 % rectal cream  Place 1 application rectally 2 (two) times daily as needed for hemorrhoids or anal itching. 30 g 1   loratadine (CLARITIN) 10 MG tablet Take 1 tablet (10 mg total) by mouth daily. 30 tablet 11   Omega-3 Fatty Acids (FISH OIL) 1200 MG CAPS Take 1,200 mg by mouth daily.     ondansetron (ZOFRAN ODT) 4 MG disintegrating tablet Take 1 tablet (4 mg total) by mouth every 8 (eight) hours as needed for nausea or vomiting. 20 tablet 0   pantoprazole (PROTONIX) 40 MG tablet Take 1 tablet (40 mg total) by mouth 2 (two) times daily. 180 tablet 3   promethazine (PHENERGAN) 25 MG tablet Take 1 tablet (25 mg total) by mouth every 8 (eight) hours as needed for nausea or vomiting. 20 tablet 0   tamoxifen (NOLVADEX) 10 MG tablet Take 1 tablet (10 mg total) by mouth 2 (two) times daily. 30 tablet 0   vitamin C (ASCORBIC ACID) 250 MG tablet Take 250 mg by mouth daily.     XIIDRA 5 % SOLN Place 1 drop into both eyes in the morning and at bedtime.  1   zolpidem (AMBIEN) 5 MG tablet Take 1 tablet (5 mg total) by mouth at bedtime as needed for sleep. 30 tablet 1   ZYPITAMAG 2 MG TABS Take 2 mg by mouth daily. 90 tablet 0   No facility-administered medications prior to visit.    Allergies  Allergen Reactions   Amoxicillin-Pot Clavulanate Nausea And Vomiting   Moxifloxacin Nausea And Vomiting    ROS     Objective:    Physical Exam  There were no vitals taken for this visit. Wt Readings from Last 3 Encounters:  04/26/22 118 lb 6.4 oz (53.7 kg)  03/25/22 119 lb 2 oz (54 kg)  03/21/22 119 lb 6.4 oz (54.2 kg)    Diabetic Foot Exam - Simple   No data filed    Lab Results  Component Value Date   WBC 5.3  03/25/2022   HGB 13.3 03/25/2022   HCT 40.0 03/25/2022   PLT 169 03/25/2022   GLUCOSE 85 03/25/2022   CHOL 147 03/21/2022   TRIG 76.0 03/21/2022   HDL 70.50 03/21/2022   LDLDIRECT 176.2 08/12/2013   LDLCALC 61 03/21/2022   ALT 26 03/25/2022   AST 30 03/25/2022   NA 140 03/25/2022   K 3.9 03/25/2022   CL 106 03/25/2022   CREATININE 0.79 03/25/2022   BUN 18 03/25/2022   CO2 28 03/25/2022   TSH 1.07 08/04/2017   HGBA1C 5.1 08/04/2017   MICROALBUR 1.1 08/12/2013    Lab Results  Component Value Date   TSH 1.07 08/04/2017   Lab Results  Component Value Date   WBC 5.3 03/25/2022   HGB 13.3 03/25/2022   HCT 40.0 03/25/2022   MCV 91.3 03/25/2022   PLT 169 03/25/2022   Lab Results  Component Value Date   NA 140 03/25/2022   K 3.9 03/25/2022   CO2 28 03/25/2022   GLUCOSE 85 03/25/2022   BUN 18 03/25/2022   CREATININE 0.79 03/25/2022   BILITOT 0.5 03/25/2022   ALKPHOS 51 03/25/2022   AST 30 03/25/2022   ALT 26 03/25/2022   PROT 7.4 03/25/2022   ALBUMIN 4.1 03/25/2022   CALCIUM 9.2 03/25/2022   ANIONGAP 6 03/25/2022   GFR 53.37 (L) 03/21/2022   Lab Results  Component Value Date   CHOL 147 03/21/2022   Lab Results  Component Value Date   HDL 70.50 03/21/2022  Lab Results  Component Value Date   LDLCALC 61 03/21/2022   Lab Results  Component Value Date   TRIG 76.0 03/21/2022   Lab Results  Component Value Date   CHOLHDL 2 03/21/2022   Lab Results  Component Value Date   HGBA1C 5.1 08/04/2017       Assessment & Plan:   Problem List Items Addressed This Visit   None    No orders of the defined types were placed in this encounter.   I discussed the assessment and treatment plan with the patient. The patient was provided an opportunity to ask questions and all were answered. The patient agreed with the plan and demonstrated an understanding of the instructions.   The patient was advised to call back or seek an in-person evaluation if the  symptoms worsen or if the condition fails to improve as anticipated.  I provided 20 minutes of face-to-face time during this encounter.   I,Amber Collins,acting as a Education administrator for [employer], DO.,have documented all relevant documentation on the behalf of Ann Held, DO,as directed by  Ann Held, DO while in the presence of Ann Held, DO.   Ann Held, DO [place] at [facility] [phone] (phone) [phone] (fax)  [place]

## 2022-06-20 NOTE — Assessment & Plan Note (Signed)
Anti viral sent to pharmacy  Cough med Quarantine 5 days and then may leave house with mask for 5 more days  If any cp or sob  Go to er

## 2022-06-21 ENCOUNTER — Encounter: Payer: Self-pay | Admitting: Family Medicine

## 2022-06-22 ENCOUNTER — Other Ambulatory Visit: Payer: Self-pay | Admitting: Family Medicine

## 2022-06-22 DIAGNOSIS — R110 Nausea: Secondary | ICD-10-CM

## 2022-06-22 MED ORDER — ONDANSETRON HCL 4 MG PO TABS: 4.0000 mg | ORAL_TABLET | Freq: Three times a day (TID) | ORAL | 0 refills | Status: DC | PRN

## 2022-06-23 DIAGNOSIS — Z20822 Contact with and (suspected) exposure to covid-19: Secondary | ICD-10-CM | POA: Diagnosis not present

## 2022-06-24 ENCOUNTER — Telehealth: Payer: Self-pay | Admitting: Nurse Practitioner

## 2022-06-24 DIAGNOSIS — Z20822 Contact with and (suspected) exposure to covid-19: Secondary | ICD-10-CM | POA: Diagnosis not present

## 2022-06-24 NOTE — Telephone Encounter (Signed)
Per 8/5 phone line pt called to cancel appointment cancel per pt

## 2022-06-27 ENCOUNTER — Inpatient Hospital Stay: Payer: Medicare PPO | Admitting: Nurse Practitioner

## 2022-06-27 DIAGNOSIS — Z20822 Contact with and (suspected) exposure to covid-19: Secondary | ICD-10-CM | POA: Diagnosis not present

## 2022-06-28 DIAGNOSIS — Z20822 Contact with and (suspected) exposure to covid-19: Secondary | ICD-10-CM | POA: Diagnosis not present

## 2022-07-01 DIAGNOSIS — Z20822 Contact with and (suspected) exposure to covid-19: Secondary | ICD-10-CM | POA: Diagnosis not present

## 2022-07-02 DIAGNOSIS — Z20822 Contact with and (suspected) exposure to covid-19: Secondary | ICD-10-CM | POA: Diagnosis not present

## 2022-07-05 DIAGNOSIS — Z20822 Contact with and (suspected) exposure to covid-19: Secondary | ICD-10-CM | POA: Diagnosis not present

## 2022-07-06 DIAGNOSIS — Z20822 Contact with and (suspected) exposure to covid-19: Secondary | ICD-10-CM | POA: Diagnosis not present

## 2022-07-07 DIAGNOSIS — R922 Inconclusive mammogram: Secondary | ICD-10-CM | POA: Diagnosis not present

## 2022-07-07 DIAGNOSIS — Z853 Personal history of malignant neoplasm of breast: Secondary | ICD-10-CM | POA: Diagnosis not present

## 2022-07-07 LAB — HM MAMMOGRAPHY

## 2022-07-09 DIAGNOSIS — Z20822 Contact with and (suspected) exposure to covid-19: Secondary | ICD-10-CM | POA: Diagnosis not present

## 2022-07-10 DIAGNOSIS — Z20822 Contact with and (suspected) exposure to covid-19: Secondary | ICD-10-CM | POA: Diagnosis not present

## 2022-07-12 DIAGNOSIS — Z08 Encounter for follow-up examination after completed treatment for malignant neoplasm: Secondary | ICD-10-CM | POA: Diagnosis not present

## 2022-07-12 DIAGNOSIS — L814 Other melanin hyperpigmentation: Secondary | ICD-10-CM | POA: Diagnosis not present

## 2022-07-12 DIAGNOSIS — L821 Other seborrheic keratosis: Secondary | ICD-10-CM | POA: Diagnosis not present

## 2022-07-12 DIAGNOSIS — D1801 Hemangioma of skin and subcutaneous tissue: Secondary | ICD-10-CM | POA: Diagnosis not present

## 2022-07-12 DIAGNOSIS — Z85828 Personal history of other malignant neoplasm of skin: Secondary | ICD-10-CM | POA: Diagnosis not present

## 2022-07-21 ENCOUNTER — Other Ambulatory Visit: Payer: Self-pay | Admitting: Family Medicine

## 2022-07-21 DIAGNOSIS — K219 Gastro-esophageal reflux disease without esophagitis: Secondary | ICD-10-CM

## 2022-08-02 ENCOUNTER — Ambulatory Visit (INDEPENDENT_AMBULATORY_CARE_PROVIDER_SITE_OTHER): Payer: Medicare PPO | Admitting: Family Medicine

## 2022-08-02 ENCOUNTER — Ambulatory Visit (INDEPENDENT_AMBULATORY_CARE_PROVIDER_SITE_OTHER): Payer: Medicare PPO

## 2022-08-02 ENCOUNTER — Encounter: Payer: Self-pay | Admitting: Family Medicine

## 2022-08-02 VITALS — BP 122/70 | HR 79 | Temp 98.1°F | Resp 18 | Ht 62.0 in | Wt 119.0 lb

## 2022-08-02 DIAGNOSIS — Z0000 Encounter for general adult medical examination without abnormal findings: Secondary | ICD-10-CM

## 2022-08-02 DIAGNOSIS — E785 Hyperlipidemia, unspecified: Secondary | ICD-10-CM

## 2022-08-02 DIAGNOSIS — E559 Vitamin D deficiency, unspecified: Secondary | ICD-10-CM

## 2022-08-02 DIAGNOSIS — Z23 Encounter for immunization: Secondary | ICD-10-CM | POA: Diagnosis not present

## 2022-08-02 LAB — COMPREHENSIVE METABOLIC PANEL
ALT: 23 U/L (ref 0–35)
AST: 30 U/L (ref 0–37)
Albumin: 3.7 g/dL (ref 3.5–5.2)
Alkaline Phosphatase: 57 U/L (ref 39–117)
BUN: 23 mg/dL (ref 6–23)
CO2: 26 mEq/L (ref 19–32)
Calcium: 9.3 mg/dL (ref 8.4–10.5)
Chloride: 103 mEq/L (ref 96–112)
Creatinine, Ser: 0.86 mg/dL (ref 0.40–1.20)
GFR: 66.09 mL/min (ref >60.00)
Glucose, Bld: 75 mg/dL (ref 70–99)
Potassium: 4.2 mEq/L (ref 3.5–5.1)
Sodium: 139 mEq/L (ref 135–145)
Total Bilirubin: 0.5 mg/dL (ref 0.2–1.2)
Total Protein: 6.7 g/dL (ref 6.0–8.3)

## 2022-08-02 LAB — CBC WITH DIFFERENTIAL/PLATELET
Basophils Absolute: 0.1 10*3/uL (ref 0.0–0.1)
Basophils Relative: 0.9 % (ref 0.0–3.0)
Eosinophils Absolute: 0.2 10*3/uL (ref 0.0–0.7)
Eosinophils Relative: 2.9 % (ref 0.0–5.0)
HCT: 37.9 % (ref 36.0–46.0)
Hemoglobin: 12.6 g/dL (ref 12.0–15.0)
Lymphocytes Relative: 22.5 % (ref 12.0–46.0)
Lymphs Abs: 1.5 10*3/uL (ref 0.7–4.0)
MCHC: 33.2 g/dL (ref 30.0–36.0)
MCV: 91.2 fl (ref 78.0–100.0)
Monocytes Absolute: 0.5 10*3/uL (ref 0.1–1.0)
Monocytes Relative: 7 % (ref 3.0–12.0)
Neutro Abs: 4.5 10*3/uL (ref 1.4–7.7)
Neutrophils Relative %: 66.7 % (ref 43.0–77.0)
Platelets: 231 10*3/uL (ref 150.0–400.0)
RBC: 4.15 Mil/uL (ref 3.87–5.11)
RDW: 14.4 % (ref 11.5–15.5)
WBC: 6.8 10*3/uL (ref 4.0–10.5)

## 2022-08-02 LAB — LIPID PANEL
Cholesterol: 196 mg/dL (ref 0–200)
HDL: 64.3 mg/dL (ref >39.00)
LDL Cholesterol: 115 mg/dL — ABNORMAL HIGH (ref 0–99)
NonHDL: 131.9
Total CHOL/HDL Ratio: 3
Triglycerides: 83 mg/dL (ref 0.0–149.0)
VLDL: 16.6 mg/dL (ref 0.0–40.0)

## 2022-08-02 LAB — VITAMIN D 25 HYDROXY (VIT D DEFICIENCY, FRACTURES): VITD: 87.21 ng/mL (ref 30.00–100.00)

## 2022-08-02 NOTE — Progress Notes (Signed)
Virtual Visit via Telephone Note  I connected with  Samantha Miranda on 08/02/22 at  1:00 PM EDT by telephone and verified that I am speaking with the correct person using two identifiers.  Medicare Annual Wellness visit completed telephonically due to Covid-19 pandemic.   Persons participating in this call: This Health Coach and this patient.   Location: Patient: home Provider: office   I discussed the limitations, risks, security and privacy concerns of performing an evaluation and management service by telephone and the availability of in person appointments. The patient expressed understanding and agreed to proceed.  Unable to perform video visit due to video visit attempted and failed and/or patient does not have video capability.   Some vital signs may be absent or patient reported.   Willette Brace, LPN   Subjective:   Samantha Miranda is a 76 y.o. female who presents for Medicare Annual (Subsequent) preventive examination.  Review of Systems     Cardiac Risk Factors include: advanced age (>53mn, >>94women);dyslipidemia     Objective:    There were no vitals filed for this visit. There is no height or weight on file to calculate BMI.     08/02/2022    1:09 PM 08/12/2021    5:44 PM 07/02/2021    7:19 AM 06/29/2021   10:21 AM 06/23/2021    5:18 PM 05/25/2021   11:46 AM 10/29/2019    1:48 PM  Advanced Directives  Does Patient Have a Medical Advance Directive? Yes No Yes Yes No No No  Type of AParamedicof [place]ChaparritoLiving will  [place] will [place] will     Copy of [place] Chart? No - copy requested  No - copy requested No - copy requested     Would patient like information on creating a medical advance directive?  No - Patient declined No - Patient declined  No - Patient declined Yes (MAU/Ambulatory/Procedural Areas - Information given) No - Patient declined    Current  Medications (verified) Outpatient Encounter Medications as of 08/02/2022  Medication Sig   Biotin w/ Vitamins C & E (HAIR SKIN & NAILS GUMMIES PO) Take 2 each by mouth daily.   Calcium-Magnesium-Zinc (CAL-MAG-ZINC PO) Take 2 tablets by mouth daily.   cholecalciferol (VITAMIN D3) 25 MCG (1000 UNIT) tablet Take 1,000 Units by mouth daily.   Efinaconazole 10 % SOLN Apply 1 drop topically daily.   ezetimibe (ZETIA) 10 MG tablet TAKE ONE TABLET BY MOUTH ONE TIME DAILY   fluticasone (FLONASE) 50 MCG/ACT nasal spray Place 2 sprays into both nostrils daily. (Patient taking differently: Place 2 sprays into both nostrils daily as needed for allergies.)   fluticasone-salmeterol (ADVAIR) 100-50 MCG/ACT AEPB INHALE ONE PUFF INTO THE LUNGS BY MOUTH TWICE A DAY   hydrocortisone (ANUSOL-HC) 2.5 % rectal cream Place 1 application rectally 2 (two) times daily as needed for hemorrhoids or anal itching.   loratadine (CLARITIN) 10 MG tablet Take 1 tablet (10 mg total) by mouth daily.   Omega-3 Fatty Acids (FISH OIL) 1200 MG CAPS Take 1,200 mg by mouth daily.   ondansetron (ZOFRAN ODT) 4 MG disintegrating tablet Take 1 tablet (4 mg total) by mouth every 8 (eight) hours as needed for nausea or vomiting.   pantoprazole (PROTONIX) 40 MG tablet Take 1 tablet (40 mg total) by mouth daily before breakfast.   promethazine (PHENERGAN) 25 MG tablet Take 1 tablet (25 mg total) by mouth  every 8 (eight) hours as needed for nausea or vomiting.   promethazine-dextromethorphan (PROMETHAZINE-DM) 6.25-15 MG/5ML syrup Take 5 mLs by mouth 4 (four) times daily as needed.   vitamin C (ASCORBIC ACID) 250 MG tablet Take 250 mg by mouth daily.   XIIDRA 5 % SOLN Place 1 drop into both eyes in the morning and at bedtime.   zolpidem (AMBIEN) 5 MG tablet Take 1 tablet (5 mg total) by mouth at bedtime as needed for sleep.   ondansetron (ZOFRAN) 4 MG tablet Take 1 tablet (4 mg total) by mouth every 8 (eight) hours as needed for nausea or vomiting.    [DISCONTINUED] tamoxifen (NOLVADEX) 10 MG tablet Take 1 tablet (10 mg total) by mouth 2 (two) times daily.   [DISCONTINUED] ZYPITAMAG 2 MG TABS Take 2 mg by mouth daily.   No facility-administered encounter medications on file as of 08/02/2022.    Allergies (verified) Amoxicillin-pot clavulanate and Moxifloxacin   History: Past Medical History:  Diagnosis Date   Barrett's esophagus    Basal cell carcinoma of left lower leg    Breast cancer (HCC)    right   COPD (chronic obstructive pulmonary disease) ([place]) 2018   Depression 1989   Gastric ulcer    GERD (gastroesophageal reflux disease)    History of colon polyps    Hyperlipidemia    Osteopenia    Past Surgical History:  Procedure Laterality Date   ABDOMINAL HYSTERECTOMY  11/21/1989   APPENDECTOMY  1969   BREAST LUMPECTOMY  07/22/2006   BREAST LUMPECTOMY WITH RADIOACTIVE SEED AND SENTINEL LYMPH NODE BIOPSY Right 07/02/2021   Procedure: RIGHT BREAST LUMPECTOMY WITH RADIOACTIVE SEED AND RIGHT AXILLARY SENTINEL LYMPH NODE BIOPSY;  Surgeon: Rolm Bookbinder, MD;  Location: [place];  Service: General;  Laterality: Right;   CATARACT EXTRACTION, BILATERAL     EYE SURGERY  08/2013  10/2013   cataracts   TONSILLECTOMY     Family History  Problem Relation Age of Onset   Heart failure Mother        CHF   Hyperlipidemia Mother    Aortic stenosis Mother    Arthritis Mother    Heart disease Mother    Lung cancer Father 61   Cancer Father    Early death Father    Breast cancer Sister 40   Cancer Sister    Depression Sister    Stroke Maternal Aunt    Varicose Veins Maternal Aunt    Diabetes Maternal Grandmother    Hyperlipidemia Maternal Grandmother    Colon cancer Maternal Grandfather    Cancer Maternal Grandfather        colon cancer   Cancer Paternal Grandfather        unknown type cancer   Drug abuse Daughter    Heart attack Neg Hx    Hypertension Neg Hx    Sudden death Neg Hx    Esophageal cancer Neg Hx    Stomach  cancer Neg Hx    Rectal cancer Neg Hx    Social History   Socioeconomic History   Marital status: Married    Spouse name: Not on file   Number of children: 1   Years of education: Not on file   Highest education level: Not on file  Occupational History   Occupation: retired    Fish farm manager: [employer]    Comment: retired  Tobacco Use   Smoking status: Former    Packs/day: 0.50    Years: 40.00    Total  pack years: 20.00    Types: Cigarettes, E-cigarettes    Quit date: 07/23/2011    Years since quitting: 11.0   Smokeless tobacco: Never   Tobacco comments:    The last 5 yrs. I smoked .5 pack a day. Before that, 1 pk. a day  Vaping Use   Vaping Use: Former   Substances: Nicotine  Substance and Sexual Activity   Alcohol use: No    Comment: occasional wine   Drug use: No   Sexual activity: Not Currently    Partners: Male    Birth control/protection: Post-menopausal  Other Topics Concern   Not on file  Social History Narrative   Exercise-virtual class, MWF   Social Determinants of Health   Financial Resource Strain: Low Risk  (08/02/2022)   Overall Financial Resource Strain (CARDIA)    Difficulty of Paying Living Expenses: Not very hard  Food Insecurity: No Food Insecurity (08/02/2022)   Hunger Vital Sign    Worried About Running Out of Food in the Last Year: Never true    Ran Out of Food in the Last Year: Never true  Transportation Needs: No Transportation Needs (08/02/2022)   PRAPARE - Hydrologist (Medical): No    Lack of Transportation (Non-Medical): No  Physical Activity: Sufficiently Active (08/02/2022)   Exercise Vital Sign    Days of Exercise per Week: 3 days    Minutes of Exercise per Session: 90 min  Stress: No Stress Concern Present (08/02/2022)   [place]    Feeling of Stress : Not at all  Social Connections: Moderately Isolated (08/02/2022)   Social  Connection and Isolation Panel [NHANES]    Frequency of Communication with Friends and Family: More than three times a week    Frequency of Social Gatherings with Friends and Family: More than three times a week    Attends Religious Services: Never    Marine scientist or Organizations: No    Attends Archivist Meetings: Never    Marital Status: Married    Tobacco Counseling Counseling given: Not Answered Tobacco comments: The last 5 yrs. I smoked .5 pack a day. Before that, 1 pk. a day   Clinical Intake:  Pre-visit preparation completed: Yes  Pain : No/denies pain     BMI - recorded: 21.77 Nutritional Status: BMI of 19-24  Normal Nutritional Risks: None Diabetes: No  How often do you need to have someone help you when you read instructions, pamphlets, or other written materials from your doctor or pharmacy?: 1 - Never  Diabetic?no  Interpreter Needed?: No  Information entered by :: Charlott Rakes, LPN   Activities of Daily Living    08/02/2022    1:11 PM 07/26/2022   11:07 AM  In your present state of health, do you have any difficulty performing the following activities:  Hearing? 0 0  Vision? 0 0  Difficulty concentrating or making decisions? 0 0  Walking or climbing stairs? 0 1  Dressing or bathing? 0 0  Doing errands, shopping? 0 0  Preparing Food and eating ? N Y  Using the Toilet? N N  In the past six months, have you accidently leaked urine? N N  Do you have problems with loss of bowel control? N N  Managing your Medications? N N  Managing your Finances? N N  Housekeeping or managing your Housekeeping? N Y    Patient Care Team: Carollee Herter, Kendrick Fries  R, DO as PCP - General Shirley Muscat Loreen Freud, MD as Referring Physician (Optometry) Izora Ribas (Dermatology) Mauro Kaufmann, RN as Oncology Nurse Navigator Rockwell Germany, RN as Oncology Nurse Navigator Rolm Bookbinder, MD as Consulting Physician (General Surgery) Truitt Merle, MD as  Consulting Physician (Hematology) Kyung Rudd, MD as Consulting Physician (Radiation Oncology)  Indicate any recent Medical Services you may have received from other than [facility] providers in the past year (date may be approximate).     Assessment:   This is a routine wellness examination for Samantha Miranda.  Hearing/Vision screen Hearing Screening - Comments:: Pt wears hearing aids  Vision Screening - Comments:: Pt follows up with Dr Truman Hayward for annual eye exams   Dietary issues and exercise activities discussed: Current Exercise Habits: Structured exercise class, Type of exercise: Other - see comments;stretching;strength training/weights, Time (Minutes): > 60, Frequency (Times/Week): 3, Weekly Exercise (Minutes/Week): 0   Goals Addressed             This Visit's Progress    Patient Stated       Maintain health        Depression Screen    08/02/2022    1:07 PM 08/02/2022    1:06 PM 11/23/2021    1:33 PM 05/25/2021   11:49 AM 01/18/2021   11:16 AM 07/20/2020    2:35 PM 10/29/2019    1:53 PM  PHQ 2/9 Scores  PHQ - 2 Score 0 1 0 0 0 0 0    Fall Risk    08/02/2022    1:10 PM 07/26/2022   11:07 AM 11/23/2021    1:33 PM 05/25/2021   11:48 AM 01/18/2021   11:16 AM  Fall Risk   Falls in the past year? 0 0 0 0 0  Number falls in past yr: 0  0 0 0  Injury with Fall? 0  0 0 0  Risk for fall due to : Impaired vision  No Fall Risks    Follow up Falls prevention discussed  Falls evaluation completed Falls prevention discussed Falls evaluation completed    FALL RISK PREVENTION PERTAINING TO THE HOME:  Any stairs in or around the home? Yes  If so, are there any without handrails? No  Home free of loose throw rugs in walkways, pet beds, electrical cords, etc? Yes  Adequate lighting in your home to reduce risk of falls? Yes   ASSISTIVE DEVICES UTILIZED TO PREVENT FALLS:  Life alert? No  Use of a cane, walker or w/c? No  Grab bars in the bathroom? Yes  Shower chair or bench in shower? Yes   Elevated toilet seat or a handicapped toilet? No   TIMED UP AND GO:  Was the test performed? No .   Cognitive Function:    12/19/2017   11:30 AM  MMSE - Mini Mental State Exam  Orientation to time 5  Orientation to Place 5  Registration 3  Attention/ Calculation 5  Recall 3  Language- name 2 objects 2  Language- repeat 1  Language- follow 3 step command 3  Language- read & follow direction 1  Write a sentence 1  Copy design 1  Total score 30        08/02/2022    1:11 PM  6CIT Screen  What Year? 0 points  What month? 0 points  What time? 0 points  Count back from 20 0 points  Months in reverse 0 points  Repeat phrase 0 points  Total Score 0 points  Immunizations Immunization History  Administered Date(s) Administered   Fluad Quad(high Dose 65+) 10/29/2019, 07/22/2021, 08/02/2022   Influenza Split 09/22/2011   Influenza, High Dose Seasonal PF 10/10/2014, 11/02/2015, 08/26/2017, 09/17/2018   Influenza-Unspecified 08/26/2017, 09/17/2018, 09/16/2020   PFIZER(Purple Top)SARS-COV-2 Vaccination 01/25/2020, 02/15/2020, 09/16/2020   Pfizer Covid-19 Vaccine Bivalent Booster 53yr & up 08/23/2021   Pneumococcal Conjugate-13 04/21/2015   Pneumococcal Polysaccharide-23 03/03/2014   Td 08/26/2010, 05/05/2021   Zoster Recombinat (Shingrix) 06/11/2017, 08/26/2017   Zoster, Live 04/02/2009    TDAP status: Up to date  Flu Vaccine status: Up to date  Pneumococcal vaccine status: Up to date  Covid-19 vaccine status: Completed vaccines  Qualifies for Shingles Vaccine? Yes   Zostavax completed Yes   Shingrix Completed?: Yes  Screening Tests Health Maintenance  Topic Date Due   COVID-19 Vaccine (5 - Pfizer risk series) 10/18/2021   MAMMOGRAM  07/08/2023   TETANUS/TDAP  05/06/2031   Pneumonia Vaccine 75 Years old  Completed   INFLUENZA VACCINE  Completed   DEXA SCAN  Completed   Hepatitis C Screening  Completed   Zoster Vaccines- Shingrix  Completed   HPV  VACCINES  Aged Out   COLONOSCOPY (Pts [phone] coverage will need to be confirmed)  Discontinued    Health Maintenance  Health Maintenance Due  Topic Date Due   COVID-19 Vaccine (5 - Pfizer risk series) 10/18/2021    Colorectal cancer screening: No longer required.   Mammogram status: Completed 07/07/22. Repeat every year  Bone Density status: Completed 09/21/21. Results reflect: Bone density results: OSTEOPENIA. Repeat every 2 years.  Additional Screening:  Hepatitis C Screening: Completed 11/02/15  Vision Screening: Recommended annual ophthalmology exams for early detection of glaucoma and other disorders of the eye. Is the patient up to date with their annual eye exam?  No  Who is the provider or what is the name of the office in which the patient attends annual eye exams? Dr LeTruman HaywardIf pt is not established with a provider, would they like to be referred to a provider to establish care? No .   Dental Screening: Recommended annual dental exams for proper oral hygiene  Community Resource Referral / Chronic Care Management: CRR required this visit?  No   CCM required this visit?  No      Plan:     I have personally reviewed and noted the following in the patient's chart:   Medical and social history Use of alcohol, tobacco or illicit drugs  Current medications and supplements including opioid prescriptions. Patient is not currently taking opioid prescriptions. Functional ability and status Nutritional status Physical activity Advanced directives List of other physicians Hospitalizations, surgeries, and ER visits in previous 12 months Vitals Screenings to include cognitive, depression, and falls Referrals and appointments  In addition, I have reviewed and discussed with patient certain preventive protocols, quality metrics, and best practice recommendations. A written personalized care plan for preventive services as well as general preventive health  recommendations were provided to patient.     TiWillette BraceLPN   07/28/71/0947 Nurse Notes: none

## 2022-08-02 NOTE — Patient Instructions (Signed)
Preventive Care 65 Years and Older, Female Preventive care refers to lifestyle choices and visits with your health care provider that can promote health and wellness. Preventive care visits are also called wellness exams. What can I expect for my preventive care visit? Counseling Your health care provider may ask you questions about your: Medical history, including: Past medical problems. Family medical history. Pregnancy and menstrual history. History of falls. Current health, including: Memory and ability to understand (cognition). Emotional well-being. Home life and relationship well-being. Sexual activity and sexual health. Lifestyle, including: Alcohol, nicotine or tobacco, and drug use. Access to firearms. Diet, exercise, and sleep habits. Work and work environment. Sunscreen use. Safety issues such as seatbelt and bike helmet use. Physical exam Your health care provider will check your: Height and weight. These may be used to calculate your BMI (body mass index). BMI is a measurement that tells if you are at a healthy weight. Waist circumference. This measures the distance around your waistline. This measurement also tells if you are at a healthy weight and may help predict your risk of certain diseases, such as type 2 diabetes and high blood pressure. Heart rate and blood pressure. Body temperature. Skin for abnormal spots. What immunizations do I need?  Vaccines are usually given at various ages, according to a schedule. Your health care provider will recommend vaccines for you based on your age, medical history, and lifestyle or other factors, such as travel or where you work. What tests do I need? Screening Your health care provider may recommend screening tests for certain conditions. This may include: Lipid and cholesterol levels. Hepatitis C test. Hepatitis B test. HIV (human immunodeficiency virus) test. STI (sexually transmitted infection) testing, if you are at  risk. Lung cancer screening. Colorectal cancer screening. Diabetes screening. This is done by checking your blood sugar (glucose) after you have not eaten for a while (fasting). Mammogram. Talk with your health care provider about how often you should have regular mammograms. BRCA-related cancer screening. This may be done if you have a family history of breast, ovarian, tubal, or peritoneal cancers. Bone density scan. This is done to screen for osteoporosis. Talk with your health care provider about your test results, treatment options, and if necessary, the need for more tests. Follow these instructions at home: Eating and drinking  Eat a diet that includes fresh fruits and vegetables, whole grains, lean protein, and low-fat dairy products. Limit your intake of foods with high amounts of sugar, saturated fats, and salt. Take vitamin and mineral supplements as recommended by your health care provider. Do not drink alcohol if your health care provider tells you not to drink. If you drink alcohol: Limit how much you have to 0-1 drink a day. Know how much alcohol is in your drink. In the [place] one drink equals one 12 oz bottle of beer (355 mL), one 5 oz glass of wine (148 mL), or one 1 oz glass of hard liquor (44 mL). Lifestyle Brush your teeth every morning and night with fluoride toothpaste. Floss one time each day. Exercise for at least 30 minutes 5 or more days each week. Do not use any products that contain nicotine or tobacco. These products include cigarettes, chewing tobacco, and vaping devices, such as e-cigarettes. If you need help quitting, ask your health care provider. Do not use drugs. If you are sexually active, practice safe sex. Use a condom or other form of protection in order to prevent STIs. Take aspirin only as told by   your health care provider. Make sure that you understand how much to take and what form to take. Work with your health care provider to find out whether it  is safe and beneficial for you to take aspirin daily. Ask your health care provider if you need to take a cholesterol-lowering medicine (statin). Find healthy ways to manage stress, such as: Meditation, yoga, or listening to music. Journaling. Talking to a trusted person. Spending time with friends and family. Minimize exposure to UV radiation to reduce your risk of skin cancer. Safety Always wear your seat belt while driving or riding in a vehicle. Do not drive: If you have been drinking alcohol. Do not ride with someone who has been drinking. When you are tired or distracted. While texting. If you have been using any mind-altering substances or drugs. Wear a helmet and other protective equipment during sports activities. If you have firearms in your house, make sure you follow all gun safety procedures. What's next? Visit your health care provider once a year for an annual wellness visit. Ask your health care provider how often you should have your eyes and teeth checked. Stay up to date on all vaccines. This information is not intended to replace advice given to you by your health care provider. Make sure you discuss any questions you have with your health care provider. Document Revised: 05/05/2021 Document Reviewed: 05/05/2021 Elsevier Patient Education  2023 Elsevier Inc.  

## 2022-08-02 NOTE — Assessment & Plan Note (Signed)
ghm utd Check labs  See avs  

## 2022-08-02 NOTE — Progress Notes (Signed)
Subjective:   By signing my name below, I, Samantha Miranda, attest that this documentation has been prepared under the direction and in the presence of Ann Held, DO  08/02/2022    Patient ID: Samantha Miranda, female    DOB: [birth year], 76 y.o.   MRN: [id number]  Chief Complaint  Patient presents with   Annual Exam    Pt states fasting     HPI Patient is in today for a comprehensive physical exam.   She denies having any fever, new moles, congestion, sinus pain, sore throat, chest pain, palpitations, cough, shortness of breath, wheezing, nausea, vomiting, diarrhea, constipation, dysuria, frequency, abdominal pain, hematuria, new muscle pain, new joint pain, headaches. She has no changes to her family medical history. She is interested in receiving the flu vaccine this year. She no longer smokes cigarettes. She participates in regular exercise by doing a move class 3x weekly for 1.25 hours. She is due or dental and vision care.    Past Medical History:  Diagnosis Date   Barrett's esophagus    Basal cell carcinoma of left lower leg    Breast cancer (HCC)    right   COPD (chronic obstructive pulmonary disease) ([place]) 2018   Depression 1989   Gastric ulcer    GERD (gastroesophageal reflux disease)    History of colon polyps    Hyperlipidemia    Osteopenia     Past Surgical History:  Procedure Laterality Date   ABDOMINAL HYSTERECTOMY  11/21/1989   APPENDECTOMY  1969   BREAST LUMPECTOMY  07/22/2006   BREAST LUMPECTOMY WITH RADIOACTIVE SEED AND SENTINEL LYMPH NODE BIOPSY Right 07/02/2021   Procedure: RIGHT BREAST LUMPECTOMY WITH RADIOACTIVE SEED AND RIGHT AXILLARY SENTINEL LYMPH NODE BIOPSY;  Surgeon: Rolm Bookbinder, MD;  Location: [facility];  Service: General;  Laterality: Right;   CATARACT EXTRACTION, BILATERAL     EYE SURGERY  08/2013  10/2013   cataracts   TONSILLECTOMY      Family History  Problem Relation Age of Onset   Heart failure Mother        CHF    Hyperlipidemia Mother    Aortic stenosis Mother    Arthritis Mother    Heart disease Mother    Lung cancer Father 55   Cancer Father    Early death Father    Breast cancer Sister 44   Cancer Sister    Depression Sister    Stroke Maternal Aunt    Varicose Veins Maternal Aunt    Diabetes Maternal Grandmother    Hyperlipidemia Maternal Grandmother    Colon cancer Maternal Grandfather    Cancer Maternal Grandfather        colon cancer   Cancer Paternal Grandfather        unknown type cancer   Drug abuse Daughter    Heart attack Neg Hx    Hypertension Neg Hx    Sudden death Neg Hx    Esophageal cancer Neg Hx    Stomach cancer Neg Hx    Rectal cancer Neg Hx     Social History   Socioeconomic History   Marital status: Married    Spouse name: Not on file   Number of children: 1   Years of education: Not on file   Highest education level: Not on file  Occupational History   Occupation: retired    Fish farm manager: [employer]    Comment: retired  Tobacco Use   Smoking status: Former  Packs/day: 0.50    Years: 40.00    Total pack years: 20.00    Types: Cigarettes, E-cigarettes    Quit date: 07/23/2011    Years since quitting: 11.0   Smokeless tobacco: Never   Tobacco comments:    The last 5 yrs. I smoked .5 pack a day. Before that, 1 pk. a day  Vaping Use   Vaping Use: Former   Substances: Nicotine  Substance and Sexual Activity   Alcohol use: No    Comment: occasional wine   Drug use: No   Sexual activity: Not Currently    Partners: Male    Birth control/protection: Post-menopausal  Other Topics Concern   Not on file  Social History Narrative   Exercise-virtual class, MWF   Social Determinants of Health   Financial Resource Strain: Low Risk  (08/02/2022)   Overall Financial Resource Strain (CARDIA)    Difficulty of Paying Living Expenses: Not very hard  Food Insecurity: No Food Insecurity (08/02/2022)   Hunger Vital Sign    Worried About Running Out of  Food in the Last Year: Never true    Ran Out of Food in the Last Year: Never true  Transportation Needs: No Transportation Needs (08/02/2022)   PRAPARE - Hydrologist (Medical): No    Lack of Transportation (Non-Medical): No  Physical Activity: Sufficiently Active (08/02/2022)   Exercise Vital Sign    Days of Exercise per Week: 3 days    Minutes of Exercise per Session: 90 min  Stress: No Stress Concern Present (08/02/2022)   [place]    Feeling of Stress : Not at all  Social Connections: Moderately Isolated (08/02/2022)   Social Connection and Isolation Panel [NHANES]    Frequency of Communication with Friends and Family: More than three times a week    Frequency of Social Gatherings with Friends and Family: More than three times a week    Attends Religious Services: Never    Marine scientist or Organizations: No    Attends Archivist Meetings: Never    Marital Status: Married  Human resources officer Violence: Not At Risk (08/02/2022)   Humiliation, Afraid, Rape, and Kick questionnaire    Fear of Current or Ex-Partner: No    Emotionally Abused: No    Physically Abused: No    Sexually Abused: No    Outpatient Medications Prior to Visit  Medication Sig Dispense Refill   Biotin w/ Vitamins C & E (HAIR SKIN & NAILS GUMMIES PO) Take 2 each by mouth daily.     Calcium-Magnesium-Zinc (CAL-MAG-ZINC PO) Take 2 tablets by mouth daily.     cholecalciferol (VITAMIN D3) 25 MCG (1000 UNIT) tablet Take 1,000 Units by mouth daily.     Efinaconazole 10 % SOLN Apply 1 drop topically daily. 4 mL 11   ezetimibe (ZETIA) 10 MG tablet TAKE ONE TABLET BY MOUTH ONE TIME DAILY 90 tablet 1   fluticasone (FLONASE) 50 MCG/ACT nasal spray Place 2 sprays into both nostrils daily. (Patient taking differently: Place 2 sprays into both nostrils daily as needed for allergies.) 16 g 5   fluticasone-salmeterol  (ADVAIR) 100-50 MCG/ACT AEPB INHALE ONE PUFF INTO THE LUNGS BY MOUTH TWICE A DAY 60 each 5   hydrocortisone (ANUSOL-HC) 2.5 % rectal cream Place 1 application rectally 2 (two) times daily as needed for hemorrhoids or anal itching. 30 g 1   loratadine (CLARITIN) 10 MG tablet Take 1 tablet (  10 mg total) by mouth daily. 30 tablet 11   Omega-3 Fatty Acids (FISH OIL) 1200 MG CAPS Take 1,200 mg by mouth daily.     ondansetron (ZOFRAN ODT) 4 MG disintegrating tablet Take 1 tablet (4 mg total) by mouth every 8 (eight) hours as needed for nausea or vomiting. 20 tablet 0   ondansetron (ZOFRAN) 4 MG tablet Take 1 tablet (4 mg total) by mouth every 8 (eight) hours as needed for nausea or vomiting. 20 tablet 0   pantoprazole (PROTONIX) 40 MG tablet Take 1 tablet (40 mg total) by mouth daily before breakfast. 90 tablet 1   promethazine (PHENERGAN) 25 MG tablet Take 1 tablet (25 mg total) by mouth every 8 (eight) hours as needed for nausea or vomiting. 20 tablet 0   promethazine-dextromethorphan (PROMETHAZINE-DM) 6.25-15 MG/5ML syrup Take 5 mLs by mouth 4 (four) times daily as needed. 118 mL 0   vitamin C (ASCORBIC ACID) 250 MG tablet Take 250 mg by mouth daily.     XIIDRA 5 % SOLN Place 1 drop into both eyes in the morning and at bedtime.  1   zolpidem (AMBIEN) 5 MG tablet Take 1 tablet (5 mg total) by mouth at bedtime as needed for sleep. 30 tablet 1   ZYPITAMAG 2 MG TABS Take 2 mg by mouth daily. 90 tablet 0   tamoxifen (NOLVADEX) 10 MG tablet Take 1 tablet (10 mg total) by mouth 2 (two) times daily. 30 tablet 0   No facility-administered medications prior to visit.    Allergies  Allergen Reactions   Amoxicillin-Pot Clavulanate Nausea And Vomiting   Moxifloxacin Nausea And Vomiting    Review of Systems  Constitutional:  Negative for fever and malaise/fatigue.  HENT:  Negative for congestion, sinus pain and sore throat.   Eyes:  Negative for blurred vision.  Respiratory:  Negative for cough, shortness  of breath and wheezing.   Cardiovascular:  Negative for chest pain, palpitations and leg swelling.  Gastrointestinal:  Negative for abdominal pain, blood in stool, constipation, diarrhea, nausea and vomiting.  Genitourinary:  Negative for dysuria, frequency and hematuria.  Musculoskeletal:  Negative for falls.       (-)new muscle pain (-)new joint pain  Skin:  Negative for rash.       (-)New moles  Neurological:  Negative for dizziness, loss of consciousness and headaches.  Endo/Heme/Allergies:  Negative for environmental allergies.  Psychiatric/Behavioral:  Negative for depression. The patient is not nervous/anxious.        Objective:    Physical Exam Vitals and nursing note reviewed.  Constitutional:      General: She is not in acute distress.    Appearance: Normal appearance. She is well-developed. She is not ill-appearing.  HENT:     Head: Normocephalic and atraumatic.     Right Ear: Tympanic membrane, ear canal and external ear normal.     Left Ear: Tympanic membrane, ear canal and external ear normal.  Eyes:     Extraocular Movements: Extraocular movements intact.     Conjunctiva/sclera: Conjunctivae normal.     Pupils: Pupils are equal, round, and reactive to light.  Neck:     Thyroid: No thyromegaly.     Vascular: No carotid bruit or JVD.  Cardiovascular:     Rate and Rhythm: Normal rate and regular rhythm.     Heart sounds: Normal heart sounds. No murmur heard.    No gallop.  Pulmonary:     Effort: Pulmonary effort is normal. No respiratory  distress.     Breath sounds: Normal breath sounds. No wheezing or rales.  Chest:     Chest wall: No tenderness.  Abdominal:     General: Bowel sounds are normal. There is no distension.     Palpations: Abdomen is soft.     Tenderness: There is no abdominal tenderness. There is no guarding.  Musculoskeletal:     Cervical back: Normal range of motion and neck supple.  Skin:    General: Skin is warm and dry.  Neurological:      Mental Status: She is alert and oriented to person, place, and time.  Psychiatric:        Judgment: Judgment normal.     BP 122/70 (BP Location: Left Arm, Patient Position: Sitting, Cuff Size: Normal)   Pulse 79   Temp 98.1 F (36.7 C) (Oral)   Resp 18   Ht '5\' 2"'$  (1.575 m)   Wt 119 lb (54 kg)   SpO2 96%   BMI 21.77 kg/m  Wt Readings from Last 3 Encounters:  08/02/22 119 lb (54 kg)  04/26/22 118 lb 6.4 oz (53.7 kg)  03/25/22 119 lb 2 oz (54 kg)    Diabetic Foot Exam - Simple   No data filed    Lab Results  Component Value Date   WBC 5.3 03/25/2022   HGB 13.3 03/25/2022   HCT 40.0 03/25/2022   PLT 169 03/25/2022   GLUCOSE 85 03/25/2022   CHOL 147 03/21/2022   TRIG 76.0 03/21/2022   HDL 70.50 03/21/2022   LDLDIRECT 176.2 08/12/2013   LDLCALC 61 03/21/2022   ALT 26 03/25/2022   AST 30 03/25/2022   NA 140 03/25/2022   K 3.9 03/25/2022   CL 106 03/25/2022   CREATININE 0.79 03/25/2022   BUN 18 03/25/2022   CO2 28 03/25/2022   TSH 1.07 08/04/2017   HGBA1C 5.1 08/04/2017   MICROALBUR 1.1 08/12/2013    Lab Results  Component Value Date   TSH 1.07 08/04/2017   Lab Results  Component Value Date   WBC 5.3 03/25/2022   HGB 13.3 03/25/2022   HCT 40.0 03/25/2022   MCV 91.3 03/25/2022   PLT 169 03/25/2022   Lab Results  Component Value Date   NA 140 03/25/2022   K 3.9 03/25/2022   CO2 28 03/25/2022   GLUCOSE 85 03/25/2022   BUN 18 03/25/2022   CREATININE 0.79 03/25/2022   BILITOT 0.5 03/25/2022   ALKPHOS 51 03/25/2022   AST 30 03/25/2022   ALT 26 03/25/2022   PROT 7.4 03/25/2022   ALBUMIN 4.1 03/25/2022   CALCIUM 9.2 03/25/2022   ANIONGAP 6 03/25/2022   GFR 53.37 (L) 03/21/2022   Lab Results  Component Value Date   CHOL 147 03/21/2022   Lab Results  Component Value Date   HDL 70.50 03/21/2022   Lab Results  Component Value Date   LDLCALC 61 03/21/2022   Lab Results  Component Value Date   TRIG 76.0 03/21/2022   Lab Results   Component Value Date   CHOLHDL 2 03/21/2022   Lab Results  Component Value Date   HGBA1C 5.1 08/04/2017   Mammogram- Last completed 07/07/2022. Results showed no evidence of malignancy. Repeat in 1 year.  Dexa- Last completed 09/21/2021. Results showed she is osteoporotic. Repeat in 2 years.  Colonoscopy- Last completed 11/25/2019. Results showed: - One diminutive polyp in the ascending colon, removed with a cold biopsy forceps. Resected and retrieved. - The examination was otherwise normal on  direct and retroflexion views. No repeat colonoscopy.      Assessment & Plan:   Problem List Items Addressed This Visit       Unprioritized   Preventative health care - Primary    ghm utd Check labs  See avs       Hyperlipidemia    Encourage heart healthy diet such as MIND or DASH diet, increase exercise, avoid trans fats, simple carbohydrates and processed foods, consider a krill or fish or flaxseed oil cap daily.       Relevant Orders   CBC with Differential/Platelet   Comprehensive metabolic panel   Lipid panel   VITAMIN D 25 Hydroxy (Vit-D Deficiency, Fractures)   Other Visit Diagnoses     Vitamin D deficiency       Relevant Orders   VITAMIN D 25 Hydroxy (Vit-D Deficiency, Fractures)   Need for influenza vaccination       Relevant Orders   Flu Vaccine QUAD High Dose(Fluad) (Completed)        No orders of the defined types were placed in this encounter.   IAnn Held, DO, personally preformed the services described in this documentation.  All medical record entries made by the scribe were at my direction and in my presence.  I have reviewed the chart and discharge instructions (if applicable) and agree that the record reflects my personal performance and is accurate and complete. 08/02/2022   I,Samantha Miranda,acting as a Education administrator for [employer], DO.,have documented all relevant documentation on the behalf of Ann Held, DO,as directed by   Ann Held, DO while in the presence of Ann Held, DO.   Ann Held, DO

## 2022-08-02 NOTE — Assessment & Plan Note (Signed)
Encourage heart healthy diet such as MIND or DASH diet, increase exercise, avoid trans fats, simple carbohydrates and processed foods, consider a krill or fish or flaxseed oil cap daily.  °

## 2022-08-02 NOTE — Patient Instructions (Signed)
Samantha Miranda , Thank you for taking time to come for your Medicare Wellness Visit. I appreciate your ongoing commitment to your health goals. Please review the following plan we discussed and let me know if I can assist you in the future.   Screening recommendations/referrals: Colonoscopy: no longer required done  Mammogram: done 07/07/22 repeat every year  Bone Density: done 09/21/21 repeat every 2 years  Recommended yearly ophthalmology/optometry visit for glaucoma screening and checkup Recommended yearly dental visit for hygiene and checkup  Vaccinations: Influenza vaccine: done 08/02/22 repeat every year  Pneumococcal vaccine: Up to date Tdap vaccine: done 05/05/21 repeat every 10 years  Shingles vaccine: completed 7/22, 08/26/17    Covid-19:completed 3/6, 3/27, 09/16/20 & 08/23/21  Advanced directives: Please bring a copy of your health care power of attorney and living will to the office at your convenience.  Conditions/risks identified: maintain health   Next appointment: Follow up in one year for your annual wellness visit    Preventive Care 65 Years and Older, Female Preventive care refers to lifestyle choices and visits with your health care provider that can promote health and wellness. What does preventive care include? A yearly physical exam. This is also called an annual well check. Dental exams once or twice a year. Routine eye exams. Ask your health care provider how often you should have your eyes checked. Personal lifestyle choices, including: Daily care of your teeth and gums. Regular physical activity. Eating a healthy diet. Avoiding tobacco and drug use. Limiting alcohol use. Practicing safe sex. Taking low-dose aspirin every day. Taking vitamin and mineral supplements as recommended by your health care provider. What happens during an annual well check? The services and screenings done by your health care provider during your annual well check will depend on your  age, overall health, lifestyle risk factors, and family history of disease. Counseling  Your health care provider may ask you questions about your: Alcohol use. Tobacco use. Drug use. Emotional well-being. Home and relationship well-being. Sexual activity. Eating habits. History of falls. Memory and ability to understand (cognition). Work and work Statistician. Reproductive health. Screening  You may have the following tests or measurements: Height, weight, and BMI. Blood pressure. Lipid and cholesterol levels. These may be checked every 5 years, or more frequently if you are over 25 years old. Skin check. Lung cancer screening. You may have this screening every year starting at age 36 if you have a 30-pack-year history of smoking and currently smoke or have quit within the past 15 years. Fecal occult blood test (FOBT) of the stool. You may have this test every year starting at age 23. Flexible sigmoidoscopy or colonoscopy. You may have a sigmoidoscopy every 5 years or a colonoscopy every 10 years starting at age 77. Hepatitis C blood test. Hepatitis B blood test. Sexually transmitted disease (STD) testing. Diabetes screening. This is done by checking your blood sugar (glucose) after you have not eaten for a while (fasting). You may have this done every 1-3 years. Bone density scan. This is done to screen for osteoporosis. You may have this done starting at age 83. Mammogram. This may be done every 1-2 years. Talk to your health care provider about how often you should have regular mammograms. Talk with your health care provider about your test results, treatment options, and if necessary, the need for more tests. Vaccines  Your health care provider may recommend certain vaccines, such as: Influenza vaccine. This is recommended every year. Tetanus, diphtheria, and acellular  pertussis (Tdap, Td) vaccine. You may need a Td booster every 10 years. Zoster vaccine. You may need this after  age 68. Pneumococcal 13-valent conjugate (PCV13) vaccine. One dose is recommended after age 7. Pneumococcal polysaccharide (PPSV23) vaccine. One dose is recommended after age 81. Talk to your health care provider about which screenings and vaccines you need and how often you need them. This information is not intended to replace advice given to you by your health care provider. Make sure you discuss any questions you have with your health care provider. Document Released: 12/04/2015 Document Revised: 07/27/2016 Document Reviewed: 09/08/2015 Elsevier Interactive Patient Education  2017 [place] Prevention in the Home Falls can cause injuries. They can happen to people of all ages. There are many things you can do to make your home safe and to help prevent falls. What can I do on the outside of my home? Regularly fix the edges of walkways and driveways and fix any cracks. Remove anything that might make you trip as you walk through a door, such as a raised step or threshold. Trim any bushes or trees on the path to your home. Use bright outdoor lighting. Clear any walking paths of anything that might make someone trip, such as rocks or tools. Regularly check to see if handrails are loose or broken. Make sure that both sides of any steps have handrails. Any raised decks and porches should have guardrails on the edges. Have any leaves, snow, or ice cleared regularly. Use sand or salt on walking paths during winter. Clean up any spills in your garage right away. This includes oil or grease spills. What can I do in the bathroom? Use night lights. Install grab bars by the toilet and in the tub and shower. Do not use towel bars as grab bars. Use non-skid mats or decals in the tub or shower. If you need to sit down in the shower, use a plastic, non-slip stool. Keep the floor dry. Clean up any water that spills on the floor as soon as it happens. Remove soap buildup in the tub or shower  regularly. Attach bath mats securely with double-sided non-slip rug tape. Do not have throw rugs and other things on the floor that can make you trip. What can I do in the bedroom? Use night lights. Make sure that you have a light by your bed that is easy to reach. Do not use any sheets or blankets that are too big for your bed. They should not hang down onto the floor. Have a firm chair that has side arms. You can use this for support while you get dressed. Do not have throw rugs and other things on the floor that can make you trip. What can I do in the kitchen? Clean up any spills right away. Avoid walking on wet floors. Keep items that you use a lot in easy-to-reach places. If you need to reach something above you, use a strong step stool that has a grab bar. Keep electrical cords out of the way. Do not use floor polish or wax that makes floors slippery. If you must use wax, use non-skid floor wax. Do not have throw rugs and other things on the floor that can make you trip. What can I do with my stairs? Do not leave any items on the stairs. Make sure that there are handrails on both sides of the stairs and use them. Fix handrails that are broken or loose. Make sure that handrails  are as long as the stairways. Check any carpeting to make sure that it is firmly attached to the stairs. Fix any carpet that is loose or worn. Avoid having throw rugs at the top or bottom of the stairs. If you do have throw rugs, attach them to the floor with carpet tape. Make sure that you have a light switch at the top of the stairs and the bottom of the stairs. If you do not have them, ask someone to add them for you. What else can I do to help prevent falls? Wear shoes that: Do not have high heels. Have rubber bottoms. Are comfortable and fit you well. Are closed at the toe. Do not wear sandals. If you use a stepladder: Make sure that it is fully opened. Do not climb a closed stepladder. Make sure that  both sides of the stepladder are locked into place. Ask someone to hold it for you, if possible. Clearly mark and make sure that you can see: Any grab bars or handrails. First and last steps. Where the edge of each step is. Use tools that help you move around (mobility aids) if they are needed. These include: Canes. Walkers. Scooters. Crutches. Turn on the lights when you go into a dark area. Replace any light bulbs as soon as they burn out. Set up your furniture so you have a clear path. Avoid moving your furniture around. If any of your floors are uneven, fix them. If there are any pets around you, be aware of where they are. Review your medicines with your doctor. Some medicines can make you feel dizzy. This can increase your chance of falling. Ask your doctor what other things that you can do to help prevent falls. This information is not intended to replace advice given to you by your health care provider. Make sure you discuss any questions you have with your health care provider. Document Released: 09/03/2009 Document Revised: 04/14/2016 Document Reviewed: 12/12/2014 Elsevier Interactive Patient Education  2017 [employer].

## 2022-08-07 ENCOUNTER — Other Ambulatory Visit: Payer: Self-pay | Admitting: Family Medicine

## 2022-08-07 DIAGNOSIS — E785 Hyperlipidemia, unspecified: Secondary | ICD-10-CM

## 2022-08-14 ENCOUNTER — Other Ambulatory Visit: Payer: Self-pay | Admitting: Nurse Practitioner

## 2022-08-16 ENCOUNTER — Other Ambulatory Visit: Payer: Self-pay | Admitting: Family Medicine

## 2022-08-16 ENCOUNTER — Encounter: Payer: Self-pay | Admitting: Family Medicine

## 2022-08-16 DIAGNOSIS — M1990 Unspecified osteoarthritis, unspecified site: Secondary | ICD-10-CM

## 2022-08-23 DIAGNOSIS — M25561 Pain in right knee: Secondary | ICD-10-CM | POA: Diagnosis not present

## 2022-08-23 DIAGNOSIS — M1990 Unspecified osteoarthritis, unspecified site: Secondary | ICD-10-CM | POA: Diagnosis not present

## 2022-08-23 DIAGNOSIS — M2550 Pain in unspecified joint: Secondary | ICD-10-CM | POA: Diagnosis not present

## 2022-08-23 DIAGNOSIS — M79641 Pain in right hand: Secondary | ICD-10-CM | POA: Diagnosis not present

## 2022-08-23 DIAGNOSIS — Z79899 Other long term (current) drug therapy: Secondary | ICD-10-CM | POA: Diagnosis not present

## 2022-08-23 DIAGNOSIS — M79643 Pain in unspecified hand: Secondary | ICD-10-CM | POA: Diagnosis not present

## 2022-08-23 DIAGNOSIS — M79671 Pain in right foot: Secondary | ICD-10-CM | POA: Diagnosis not present

## 2022-08-23 DIAGNOSIS — M79642 Pain in left hand: Secondary | ICD-10-CM | POA: Diagnosis not present

## 2022-08-23 DIAGNOSIS — M7989 Other specified soft tissue disorders: Secondary | ICD-10-CM | POA: Diagnosis not present

## 2022-08-23 DIAGNOSIS — M25562 Pain in left knee: Secondary | ICD-10-CM | POA: Diagnosis not present

## 2022-08-23 DIAGNOSIS — M79672 Pain in left foot: Secondary | ICD-10-CM | POA: Diagnosis not present

## 2022-09-07 DIAGNOSIS — Z79899 Other long term (current) drug therapy: Secondary | ICD-10-CM | POA: Diagnosis not present

## 2022-09-07 DIAGNOSIS — M1990 Unspecified osteoarthritis, unspecified site: Secondary | ICD-10-CM | POA: Diagnosis not present

## 2022-09-07 DIAGNOSIS — M0579 Rheumatoid arthritis with rheumatoid factor of multiple sites without organ or systems involvement: Secondary | ICD-10-CM | POA: Diagnosis not present

## 2022-09-07 DIAGNOSIS — M79643 Pain in unspecified hand: Secondary | ICD-10-CM | POA: Diagnosis not present

## 2022-09-07 DIAGNOSIS — M7989 Other specified soft tissue disorders: Secondary | ICD-10-CM | POA: Diagnosis not present

## 2022-09-08 ENCOUNTER — Other Ambulatory Visit: Payer: Self-pay | Admitting: Family Medicine

## 2022-09-08 DIAGNOSIS — G4700 Insomnia, unspecified: Secondary | ICD-10-CM

## 2022-09-08 NOTE — Telephone Encounter (Signed)
Requesting: Ambien '5mg'$   Contract: 02/05/13 UDS: 08/12/13 Last Visit: 08/02/22 Next Visit: None Last Refill: 07/22/21 #30 and 1RF  Please Advise

## 2022-09-26 ENCOUNTER — Inpatient Hospital Stay: Payer: Medicare PPO | Attending: Hematology | Admitting: Hematology

## 2022-09-26 ENCOUNTER — Encounter: Payer: Self-pay | Admitting: Hematology

## 2022-09-26 ENCOUNTER — Inpatient Hospital Stay: Payer: Medicare PPO

## 2022-09-26 VITALS — BP 155/78 | HR 80 | Temp 97.6°F | Resp 16 | Ht 62.0 in | Wt 122.1 lb

## 2022-09-26 DIAGNOSIS — C50811 Malignant neoplasm of overlapping sites of right female breast: Secondary | ICD-10-CM | POA: Insufficient documentation

## 2022-09-26 DIAGNOSIS — C50111 Malignant neoplasm of central portion of right female breast: Secondary | ICD-10-CM | POA: Diagnosis not present

## 2022-09-26 DIAGNOSIS — Z170 Estrogen receptor positive status [ER+]: Secondary | ICD-10-CM | POA: Insufficient documentation

## 2022-09-26 DIAGNOSIS — Z79899 Other long term (current) drug therapy: Secondary | ICD-10-CM | POA: Diagnosis not present

## 2022-09-26 DIAGNOSIS — M069 Rheumatoid arthritis, unspecified: Secondary | ICD-10-CM | POA: Insufficient documentation

## 2022-09-26 DIAGNOSIS — Z171 Estrogen receptor negative status [ER-]: Secondary | ICD-10-CM | POA: Diagnosis not present

## 2022-09-26 LAB — CBC WITH DIFFERENTIAL (CANCER CENTER ONLY)
Abs Immature Granulocytes: 0.02 10*3/uL (ref 0.00–0.07)
Basophils Absolute: 0.1 10*3/uL (ref 0.0–0.1)
Basophils Relative: 1 %
Eosinophils Absolute: 0 10*3/uL (ref 0.0–0.5)
Eosinophils Relative: 1 %
HCT: 38.8 % (ref 36.0–46.0)
Hemoglobin: 13.2 g/dL (ref 12.0–15.0)
Immature Granulocytes: 0 %
Lymphocytes Relative: 18 %
Lymphs Abs: 1.1 10*3/uL (ref 0.7–4.0)
MCH: 31 pg (ref 26.0–34.0)
MCHC: 34 g/dL (ref 30.0–36.0)
MCV: 91.1 fL (ref 80.0–100.0)
Monocytes Absolute: 0.2 10*3/uL (ref 0.1–1.0)
Monocytes Relative: 4 %
Neutro Abs: 4.7 10*3/uL (ref 1.7–7.7)
Neutrophils Relative %: 76 %
Platelet Count: 230 10*3/uL (ref 150–400)
RBC: 4.26 MIL/uL (ref 3.87–5.11)
RDW: 13.8 % (ref 11.5–15.5)
WBC Count: 6.2 10*3/uL (ref 4.0–10.5)
nRBC: 0 % (ref 0.0–0.2)

## 2022-09-26 LAB — CMP (CANCER CENTER ONLY)
ALT: 22 U/L (ref 0–44)
AST: 26 U/L (ref 15–41)
Albumin: 4.2 g/dL (ref 3.5–5.0)
Alkaline Phosphatase: 41 U/L (ref 38–126)
Anion gap: 7 (ref 5–15)
BUN: 19 mg/dL (ref 8–23)
CO2: 26 mmol/L (ref 22–32)
Calcium: 9.3 mg/dL (ref 8.9–10.3)
Chloride: 106 mmol/L (ref 98–111)
Creatinine: 1.01 mg/dL — ABNORMAL HIGH (ref 0.44–1.00)
GFR, Estimated: 58 mL/min — ABNORMAL LOW (ref >60)
Glucose, Bld: 105 mg/dL — ABNORMAL HIGH (ref 70–99)
Potassium: 3.9 mmol/L (ref 3.5–5.1)
Sodium: 139 mmol/L (ref 135–145)
Total Bilirubin: 0.5 mg/dL (ref 0.3–1.2)
Total Protein: 7.1 g/dL (ref 6.5–8.1)

## 2022-09-26 NOTE — Progress Notes (Signed)
[facility]   [phone] [phone]) [phone]   Clinic Follow up Note   Patient Care Team: Carollee Herter, Alferd Apa, DO as PCP - General Shirley Muscat Loreen Freud, MD as Referring Physician (Optometry) Izora Ribas (Dermatology) Rolm Bookbinder, MD as Consulting Physician (General Surgery) Truitt Merle, MD as Consulting Physician (Hematology) Lahoma Rocker, MD as Consulting Physician (Rheumatology)  Date of Service:  09/26/2022  CHIEF COMPLAINT: f/u of right breast cancer  CURRENT THERAPY:  Surveillance  ASSESSMENT & PLAN:  Samantha Miranda is a 76 y.o. post-hysterectomy female with   1. 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 S/p lumpectomy on 07/02/21 by Dr. Donne Hazel showed 1.2 cm IDC and DCIS, margins and lymph nodes negative. -given her previous right breast radiation, additional radiation is not recommended. -RS of 28, risk of 17%, chemo was recommended but pt declined -most recent mammogram 07/07/22 was benign. -she did not tolerate antiestrogens-- exemestane caused worsening depression, insomnia, and joint pain; tamoxifen caused unbearable hot flashes. We reviewed that while her cancer was early stage, her risk of recurrence is 17% in 9 years. However, given her poor tolerance, I do not think she can tolerate any other antiestrogens. -she is clinically doing well. Labs reviewed, all WNL. Physical exam was unremarkable. There is no clinical concern for recurrence. -f/u in 6 months   2.  Rheumatoid Arthritis -presented with worsening arthralgias, referred to Dr. Kathlene November in rheumatology in 08/2022. Labs showed positive rheumatoid factor. -she is now on methotrexate with prednisone.   3. H/o Right Breast DCIS, ER+/PR+ -diagnosed in 2007, s/p surgery and radiation therapy. She did not receive antiestrogen therapy.   4. Bone Health  -Repeat DEXA  09/21/21 showed osteoporosis in the left femur neck with T score -2.8 -She currently takes calcium, vitamin D, and performs weightbearing exercise -bisphosphonate was previously discussed.      PLAN -Continue breast cancer surveillance  -lab and survivorship with NP Mendel Ryder in 6 months   No problem-specific Assessment & Plan notes found for this encounter.   SUMMARY OF ONCOLOGIC HISTORY: Oncology History Overview Note  Cancer Staging 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 [facility]) Staging form: Breast, AJCC 8th Edition - Clinical stage from 06/21/2021: Stage IA (cT1b, cN0, cM0, G1, ER+, PR+, HER2-) - Signed by Truitt Merle, MD on 06/23/2021 Stage prefix: Initial diagnosis Histologic grading system: 3 grade system - Pathologic stage from 07/02/2021: Stage IA (pT1c, pN0, cM0, G1, ER+, PR+, HER2-, Oncotype DX score: 28) - Signed by Truitt Merle, MD on 07/21/2021 Stage prefix: Initial diagnosis Multigene prognostic tests performed: Oncotype DX Recurrence score range: Greater than or equal to 11 Histologic grading system: 3 grade system     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 ([place])  06/10/2021 Mammogram   Right Diagnostic Mammogram; Right Breast Ultrasound  IMPRESSION:  The 1 cm x 1 cm x 1 cm irregular mass in the right breast is suspicious of malignancy.   06/17/2021 Pathology Results   Diagnosis Breast, right, needle core biopsy, 3:00, 4 cmfn  - INVASIVE MAMMARY CARCINOMA, SEE COMMENT  - MAMMARY CARCINOMA IN SITU. The carcinoma appears grade 1. E-cadherin is positive in the invasive and in situ carcinoma consistent with a ductal phenotype.  PROGNOSTIC INDICATORS Results: IMMUNOHISTOCHEMICAL AND MORPHOMETRIC ANALYSIS PERFORMED MANUALLY The tumor cells are EQUIVOCAL for HER2 (2+). Her2 by FISH will be performed and results reported separately. Estrogen  Receptor: 90%, POSITIVE, STRONG STAINING INTENSITY Progesterone Receptor: 5%, POSITIVE, STRONG STAINING  INTENSITY Proliferation Marker Ki67: 10%  Her2 negative by [facility]   06/21/2021 Cancer Staging   Staging form: Breast, AJCC 8th Edition - Clinical stage from 06/21/2021: Stage IA (cT1b, cN0, cM0, G1, ER+, PR+, HER2-) - Signed by Truitt Merle, MD on 06/23/2021 Stage prefix: Initial diagnosis Histologic grading system: 3 grade system   06/23/2021 Initial Diagnosis   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 ([place])   07/02/2021 Pathology Results   FINAL MICROSCOPIC DIAGNOSIS:   A. BREAST, RIGHT, LUMPECTOMY:  - Invasive and in situ ductal carcinoma, 1.2 cm.  - Carcinoma is 0.7 cm from superior margin.  - Biopsy site and biopsy clip.  - See oncology table.   B. BREAST, RIGHT ADDITIONAL INFERIOR MARGIN, EXCISION:  - Fibrocystic changes.  - Final inferior margin negative for carcinoma.   C. LYMPH NODE, RIGHT AXILLARY, SENTINEL, EXCISION:  - One lymph node with capsular nevus and negative for metastatic  carcinoma (0/1).   D. LYMPH NODE, RIGHT AXILLARY, SENTINEL, EXCISION:  - One lymph node negative for metastatic carcinoma (0/1).   E. LYMPH NODE, RIGHT AXILLARY, SENTINEL, EXCISION:  - One lymph node negative for metastatic carcinoma (0/1).   F. LYMPH NODE, RIGHT AXILLARY, SENTINEL, EXCISION:  - One lymph node negative for metastatic carcinoma (0/1).   G. LYMPH NODE, RIGHT AXILLARY, SENTINEL, EXCISION:  - One lymph node negative for metastatic carcinoma (0/1).   H. BREAST, RIGHT ADDITIONAL LATERAL MARGIN, EXCISION:  - Fibrocystic changes.  - Final lateral margin negative for carcinoma.   I. BREAST, RIGHT ADDITIONAL POSTERIOR MARGIN, EXCISION:  - Benign breast tissue.  - Final posterior margin negative for carcinoma.    07/02/2021 Cancer Staging   Staging form: Breast, AJCC 8th Edition - Pathologic stage from 07/02/2021: Stage IA (pT1c, pN0, cM0, G1, ER+, PR+, HER2-, Oncotype DX score: 28) - Signed by Truitt Merle, MD on 07/21/2021 Stage prefix: Initial diagnosis Multigene prognostic  tests performed: Oncotype DX Recurrence score range: Greater than or equal to 11 Histologic grading system: 3 grade system      INTERVAL HISTORY:  Samantha Miranda is here for a follow up of breast cancer. She was last seen by me on 03/25/22. She presents to the clinic alone. She reports she is doing well overall. She tells me she was recently diagnosed with rheumatoid arthritis and was put on methotrexate.   All other systems were reviewed with the patient and are negative.  MEDICAL HISTORY:  Past Medical History:  Diagnosis Date   Barrett's esophagus    Basal cell carcinoma of left lower leg    Breast cancer ([place])    right   COPD (chronic obstructive pulmonary disease) ([place]) 2018   Depression 1989   Gastric ulcer    GERD (gastroesophageal reflux disease)    History of colon polyps    Hyperlipidemia    Osteopenia     SURGICAL HISTORY: Past Surgical History:  Procedure Laterality Date   ABDOMINAL HYSTERECTOMY  11/21/1989   APPENDECTOMY  1969   BREAST LUMPECTOMY  07/22/2006   BREAST LUMPECTOMY WITH RADIOACTIVE SEED AND SENTINEL LYMPH NODE BIOPSY Right 07/02/2021   Procedure: RIGHT BREAST LUMPECTOMY WITH RADIOACTIVE SEED AND RIGHT AXILLARY SENTINEL LYMPH NODE BIOPSY;  Surgeon: Rolm Bookbinder, MD;  Location: [facility];  Service: General;  Laterality: Right;   CATARACT EXTRACTION, BILATERAL     EYE SURGERY  08/2013  10/2013   cataracts   TONSILLECTOMY  I have reviewed the social history and family history with the patient and they are unchanged from previous note.  ALLERGIES:  is allergic to amoxicillin-pot clavulanate and moxifloxacin.  MEDICATIONS:  Current Outpatient Medications  Medication Sig Dispense Refill   Biotin w/ Vitamins C & E (HAIR SKIN & NAILS GUMMIES PO) Take 2 each by mouth daily.     Calcium-Magnesium-Zinc (CAL-MAG-ZINC PO) Take 2 tablets by mouth daily.     cholecalciferol (VITAMIN D3) 25 MCG (1000 UNIT) tablet Take 1,000 Units by mouth daily.      Efinaconazole 10 % SOLN Apply 1 drop topically daily. 4 mL 11   ezetimibe (ZETIA) 10 MG tablet Take 1 tablet (10 mg total) by mouth daily. 90 tablet 1   fluticasone (FLONASE) 50 MCG/ACT nasal spray Place 2 sprays into both nostrils daily. (Patient taking differently: Place 2 sprays into both nostrils daily as needed for allergies.) 16 g 5   fluticasone-salmeterol (ADVAIR) 100-50 MCG/ACT AEPB INHALE ONE PUFF INTO THE LUNGS BY MOUTH TWICE A DAY 60 each 5   gabapentin (NEURONTIN) 100 MG capsule TAKE ONE CAPSULE BY MOUTH AT BEDTIME, MAY GRADUALLY INCREASE TO 3 CAPSULES AT NIGHT IF TOLERATED 90 capsule 0   hydrocortisone (ANUSOL-HC) 2.5 % rectal cream Place 1 application rectally 2 (two) times daily as needed for hemorrhoids or anal itching. 30 g 1   loratadine (CLARITIN) 10 MG tablet Take 1 tablet (10 mg total) by mouth daily. 30 tablet 11   Omega-3 Fatty Acids (FISH OIL) 1200 MG CAPS Take 1,200 mg by mouth daily.     ondansetron (ZOFRAN ODT) 4 MG disintegrating tablet Take 1 tablet (4 mg total) by mouth every 8 (eight) hours as needed for nausea or vomiting. 20 tablet 0   ondansetron (ZOFRAN) 4 MG tablet Take 1 tablet (4 mg total) by mouth every 8 (eight) hours as needed for nausea or vomiting. 20 tablet 0   pantoprazole (PROTONIX) 40 MG tablet Take 1 tablet (40 mg total) by mouth daily before breakfast. 90 tablet 1   promethazine (PHENERGAN) 25 MG tablet Take 1 tablet (25 mg total) by mouth every 8 (eight) hours as needed for nausea or vomiting. 20 tablet 0   promethazine-dextromethorphan (PROMETHAZINE-DM) 6.25-15 MG/5ML syrup Take 5 mLs by mouth 4 (four) times daily as needed. 118 mL 0   vitamin C (ASCORBIC ACID) 250 MG tablet Take 250 mg by mouth daily.     XIIDRA 5 % SOLN Place 1 drop into both eyes in the morning and at bedtime.  1   zolpidem (AMBIEN) 5 MG tablet TAKE ONE TABLET BY MOUTH AT BEDTIME AS NEEDED FOR SLEEP 30 tablet 1   No current facility-administered medications for this visit.     PHYSICAL EXAMINATION: ECOG PERFORMANCE STATUS: 1 - Symptomatic but completely ambulatory  Vitals:   09/26/22 1312  BP: (!) 155/78  Pulse: 80  Resp: 16  Temp: 97.6 F (36.4 C)  SpO2: 97%   Wt Readings from Last 3 Encounters:  09/26/22 122 lb 1.6 oz (55.4 kg)  08/02/22 119 lb (54 kg)  04/26/22 118 lb 6.4 oz (53.7 kg)     GENERAL:alert, no distress and comfortable SKIN: skin color, texture, turgor are normal, no rashes or significant lesions EYES: normal, Conjunctiva are pink and non-injected, sclera clear  NECK: supple, thyroid normal size, non-tender, without nodularity LYMPH:  no palpable lymphadenopathy in the cervical, axillary LUNGS: clear to auscultation and percussion with normal breathing effort HEART: regular rate & rhythm and no  murmurs and no lower extremity edema ABDOMEN:abdomen soft, non-tender and normal bowel sounds Musculoskeletal:no cyanosis of digits and no clubbing  NEURO: alert & oriented x 3 with fluent speech, no focal motor/sensory deficits BREAST: No palpable mass, nodules or adenopathy bilaterally. Breast exam benign.   LABORATORY DATA:  I have reviewed the data as listed    Latest Ref Rng & Units 09/26/2022   12:27 PM 08/02/2022   12:14 PM 03/25/2022   12:46 PM  CBC  WBC 4.0 - 10.5 K/uL 6.2  6.8  5.3   Hemoglobin 12.0 - 15.0 g/dL 13.2  12.6  13.3   Hematocrit 36.0 - 46.0 % 38.8  37.9  40.0   Platelets 150 - 400 K/uL 230  231.0  169         Latest Ref Rng & Units 09/26/2022   12:27 PM 08/02/2022   12:14 PM 03/25/2022   12:46 PM  CMP  Glucose 70 - 99 mg/dL 105  75  85   BUN 8 - 23 mg/dL _0 Creatinine 0.44 - 1.00 mg/dL 1.01  0.86  0.79   Sodium 135 - 145 mmol/L 139  139  140   Potassium 3.5 - 5.1 mmol/L 3.9  4.2  3.9   Chloride 98 - 111 mmol/L 106  103  106   CO2 22 - 32 mmol/L _1 Calcium 8.9 - 10.3 mg/dL 9.3  9.3  9.2   Total Protein 6.5 - 8.1 g/dL 7.1  6.7  7.4   Total Bilirubin 0.3 - 1.2 mg/dL 0.5  0.5  0.5    Alkaline Phos 38 - 126 U/L 41  57  51   AST 15 - 41 U/L _2 ALT 0 - 44 U/L _3 RADIOGRAPHIC STUDIES: I have personally reviewed the radiological images as listed and agreed with the findings in the report. No results found.    No orders of the defined types were placed in this encounter.  All questions were answered. The patient knows to call the clinic with any problems, questions or concerns. No barriers to learning was detected. The total time spent in the appointment was 30 minutes.     Truitt Merle, MD 09/26/2022   I, Wilburn Mylar, am acting as scribe for Truitt Merle, MD.   I have reviewed the above documentation for accuracy and completeness, and I agree with the above.

## 2022-09-29 ENCOUNTER — Other Ambulatory Visit: Payer: Self-pay

## 2022-09-29 DIAGNOSIS — Z122 Encounter for screening for malignant neoplasm of respiratory organs: Secondary | ICD-10-CM

## 2022-09-29 DIAGNOSIS — Z87891 Personal history of nicotine dependence: Secondary | ICD-10-CM

## 2022-10-12 DIAGNOSIS — M1990 Unspecified osteoarthritis, unspecified site: Secondary | ICD-10-CM | POA: Diagnosis not present

## 2022-10-12 DIAGNOSIS — M79643 Pain in unspecified hand: Secondary | ICD-10-CM | POA: Diagnosis not present

## 2022-10-12 DIAGNOSIS — M0579 Rheumatoid arthritis with rheumatoid factor of multiple sites without organ or systems involvement: Secondary | ICD-10-CM | POA: Diagnosis not present

## 2022-10-12 DIAGNOSIS — M7989 Other specified soft tissue disorders: Secondary | ICD-10-CM | POA: Diagnosis not present

## 2022-10-12 DIAGNOSIS — Z79899 Other long term (current) drug therapy: Secondary | ICD-10-CM | POA: Diagnosis not present

## 2022-11-01 ENCOUNTER — Ambulatory Visit
Admission: RE | Admit: 2022-11-01 | Discharge: 2022-11-01 | Disposition: A | Discharge status: Home / Self Care | Payer: Medicare PPO | Source: Ambulatory Visit | Attending: Family Medicine | Admitting: Family Medicine

## 2022-11-01 DIAGNOSIS — Z87891 Personal history of nicotine dependence: Secondary | ICD-10-CM | POA: Diagnosis not present

## 2022-11-01 DIAGNOSIS — Z122 Encounter for screening for malignant neoplasm of respiratory organs: Secondary | ICD-10-CM

## 2022-11-03 ENCOUNTER — Other Ambulatory Visit: Payer: Self-pay | Admitting: Acute Care

## 2022-11-03 DIAGNOSIS — Z122 Encounter for screening for malignant neoplasm of respiratory organs: Secondary | ICD-10-CM

## 2022-11-03 DIAGNOSIS — Z87891 Personal history of nicotine dependence: Secondary | ICD-10-CM

## 2022-11-23 ENCOUNTER — Other Ambulatory Visit: Payer: Self-pay | Admitting: Nurse Practitioner

## 2022-11-23 ENCOUNTER — Telehealth: Payer: Self-pay

## 2022-11-23 NOTE — Telephone Encounter (Signed)
Faxed back signed rx refill denial as per Dr. Burr Medico patent no longer needs med

## 2022-11-25 ENCOUNTER — Encounter: Payer: Self-pay | Admitting: Hematology

## 2022-11-25 MED ORDER — GABAPENTIN 100 MG PO CAPS: 100.0000 mg | ORAL_CAPSULE | Freq: Three times a day (TID) | ORAL | 0 refills | Status: DC

## 2022-12-12 DIAGNOSIS — M1990 Unspecified osteoarthritis, unspecified site: Secondary | ICD-10-CM | POA: Diagnosis not present

## 2022-12-12 DIAGNOSIS — M79643 Pain in unspecified hand: Secondary | ICD-10-CM | POA: Diagnosis not present

## 2022-12-12 DIAGNOSIS — M0579 Rheumatoid arthritis with rheumatoid factor of multiple sites without organ or systems involvement: Secondary | ICD-10-CM | POA: Diagnosis not present

## 2022-12-12 DIAGNOSIS — Z79899 Other long term (current) drug therapy: Secondary | ICD-10-CM | POA: Diagnosis not present

## 2022-12-12 DIAGNOSIS — R748 Abnormal levels of other serum enzymes: Secondary | ICD-10-CM | POA: Diagnosis not present

## 2022-12-12 DIAGNOSIS — M7989 Other specified soft tissue disorders: Secondary | ICD-10-CM | POA: Diagnosis not present

## 2023-01-23 DIAGNOSIS — M7989 Other specified soft tissue disorders: Secondary | ICD-10-CM | POA: Diagnosis not present

## 2023-01-23 DIAGNOSIS — M79643 Pain in unspecified hand: Secondary | ICD-10-CM | POA: Diagnosis not present

## 2023-01-23 DIAGNOSIS — Z79899 Other long term (current) drug therapy: Secondary | ICD-10-CM | POA: Diagnosis not present

## 2023-01-23 DIAGNOSIS — M0579 Rheumatoid arthritis with rheumatoid factor of multiple sites without organ or systems involvement: Secondary | ICD-10-CM | POA: Diagnosis not present

## 2023-01-23 DIAGNOSIS — R748 Abnormal levels of other serum enzymes: Secondary | ICD-10-CM | POA: Diagnosis not present

## 2023-01-23 DIAGNOSIS — M1990 Unspecified osteoarthritis, unspecified site: Secondary | ICD-10-CM | POA: Diagnosis not present

## 2023-01-24 ENCOUNTER — Other Ambulatory Visit: Payer: Self-pay | Admitting: Family Medicine

## 2023-01-24 DIAGNOSIS — R053 Chronic cough: Secondary | ICD-10-CM

## 2023-01-31 ENCOUNTER — Other Ambulatory Visit (INDEPENDENT_AMBULATORY_CARE_PROVIDER_SITE_OTHER): Payer: Medicare PPO

## 2023-01-31 ENCOUNTER — Ambulatory Visit: Payer: Medicare PPO | Admitting: Family Medicine

## 2023-01-31 ENCOUNTER — Other Ambulatory Visit: Payer: Self-pay

## 2023-01-31 DIAGNOSIS — E785 Hyperlipidemia, unspecified: Secondary | ICD-10-CM | POA: Diagnosis not present

## 2023-02-01 ENCOUNTER — Other Ambulatory Visit: Payer: Self-pay | Admitting: Family Medicine

## 2023-02-01 ENCOUNTER — Encounter: Payer: Self-pay | Admitting: Family Medicine

## 2023-02-01 DIAGNOSIS — R110 Nausea: Secondary | ICD-10-CM

## 2023-02-01 DIAGNOSIS — K219 Gastro-esophageal reflux disease without esophagitis: Secondary | ICD-10-CM

## 2023-02-01 LAB — CBC WITH DIFFERENTIAL/PLATELET
Basophils Absolute: 0.1 10*3/uL (ref 0.0–0.1)
Basophils Relative: 1.5 % (ref 0.0–3.0)
Eosinophils Absolute: 0.1 10*3/uL (ref 0.0–0.7)
Eosinophils Relative: 1.2 % (ref 0.0–5.0)
HCT: 38.8 % (ref 36.0–46.0)
Hemoglobin: 12.9 g/dL (ref 12.0–15.0)
Lymphocytes Relative: 22.8 % (ref 12.0–46.0)
Lymphs Abs: 1.4 10*3/uL (ref 0.7–4.0)
MCHC: 33.3 g/dL (ref 30.0–36.0)
MCV: 92.3 fl (ref 78.0–100.0)
Monocytes Absolute: 0.4 10*3/uL (ref 0.1–1.0)
Monocytes Relative: 7.2 % (ref 3.0–12.0)
Neutro Abs: 4 10*3/uL (ref 1.4–7.7)
Neutrophils Relative %: 67.3 % (ref 43.0–77.0)
Platelets: 212 10*3/uL (ref 150.0–400.0)
RBC: 4.21 Mil/uL (ref 3.87–5.11)
RDW: 13.2 % (ref 11.5–15.5)
WBC: 5.9 10*3/uL (ref 4.0–10.5)

## 2023-02-01 LAB — COMPREHENSIVE METABOLIC PANEL
ALT: 35 U/L (ref 0–35)
AST: 33 U/L (ref 0–37)
Albumin: 3.9 g/dL (ref 3.5–5.2)
Alkaline Phosphatase: 59 U/L (ref 39–117)
BUN: 16 mg/dL (ref 6–23)
CO2: 27 mEq/L (ref 19–32)
Calcium: 9.2 mg/dL (ref 8.4–10.5)
Chloride: 99 mEq/L (ref 96–112)
Creatinine, Ser: 0.86 mg/dL (ref 0.40–1.20)
GFR: 65.86 mL/min (ref >60.00)
Glucose, Bld: 108 mg/dL — ABNORMAL HIGH (ref 70–99)
Potassium: 3.8 mEq/L (ref 3.5–5.1)
Sodium: 135 mEq/L (ref 135–145)
Total Bilirubin: 0.4 mg/dL (ref 0.2–1.2)
Total Protein: 6.4 g/dL (ref 6.0–8.3)

## 2023-02-01 LAB — LIPID PANEL
Cholesterol: 241 mg/dL — ABNORMAL HIGH (ref 0–200)
HDL: 82.6 mg/dL (ref >39.00)
LDL Cholesterol: 144 mg/dL — ABNORMAL HIGH (ref 0–99)
NonHDL: 157.91
Total CHOL/HDL Ratio: 3
Triglycerides: 68 mg/dL (ref 0.0–149.0)
VLDL: 13.6 mg/dL (ref 0.0–40.0)

## 2023-02-01 MED ORDER — ONDANSETRON HCL 4 MG PO TABS: 4.0000 mg | ORAL_TABLET | Freq: Three times a day (TID) | ORAL | 0 refills | Status: DC | PRN

## 2023-02-02 ENCOUNTER — Other Ambulatory Visit: Payer: Self-pay | Admitting: Family Medicine

## 2023-02-02 DIAGNOSIS — E785 Hyperlipidemia, unspecified: Secondary | ICD-10-CM

## 2023-02-03 ENCOUNTER — Encounter: Payer: Self-pay | Admitting: Family Medicine

## 2023-02-17 ENCOUNTER — Other Ambulatory Visit: Payer: Self-pay | Admitting: Family Medicine

## 2023-02-17 DIAGNOSIS — E785 Hyperlipidemia, unspecified: Secondary | ICD-10-CM

## 2023-03-16 DIAGNOSIS — R748 Abnormal levels of other serum enzymes: Secondary | ICD-10-CM | POA: Diagnosis not present

## 2023-03-16 DIAGNOSIS — M7989 Other specified soft tissue disorders: Secondary | ICD-10-CM | POA: Diagnosis not present

## 2023-03-16 DIAGNOSIS — M79643 Pain in unspecified hand: Secondary | ICD-10-CM | POA: Diagnosis not present

## 2023-03-16 DIAGNOSIS — Z79899 Other long term (current) drug therapy: Secondary | ICD-10-CM | POA: Diagnosis not present

## 2023-03-16 DIAGNOSIS — M0579 Rheumatoid arthritis with rheumatoid factor of multiple sites without organ or systems involvement: Secondary | ICD-10-CM | POA: Diagnosis not present

## 2023-03-16 DIAGNOSIS — M1990 Unspecified osteoarthritis, unspecified site: Secondary | ICD-10-CM | POA: Diagnosis not present

## 2023-03-27 ENCOUNTER — Inpatient Hospital Stay: Payer: Medicare PPO | Attending: Adult Health

## 2023-03-27 ENCOUNTER — Inpatient Hospital Stay (HOSPITAL_BASED_OUTPATIENT_CLINIC_OR_DEPARTMENT_OTHER): Payer: Medicare PPO | Admitting: Adult Health

## 2023-03-27 ENCOUNTER — Other Ambulatory Visit: Payer: Self-pay

## 2023-03-27 ENCOUNTER — Encounter: Payer: Self-pay | Admitting: Adult Health

## 2023-03-27 VITALS — BP 152/70 | HR 83 | Temp 98.0°F | Resp 18 | Ht 62.0 in | Wt 123.8 lb

## 2023-03-27 DIAGNOSIS — Z171 Estrogen receptor negative status [ER-]: Secondary | ICD-10-CM | POA: Diagnosis not present

## 2023-03-27 DIAGNOSIS — Z170 Estrogen receptor positive status [ER+]: Secondary | ICD-10-CM

## 2023-03-27 DIAGNOSIS — C50211 Malignant neoplasm of upper-inner quadrant of right female breast: Secondary | ICD-10-CM | POA: Diagnosis not present

## 2023-03-27 DIAGNOSIS — C50111 Malignant neoplasm of central portion of right female breast: Secondary | ICD-10-CM | POA: Diagnosis not present

## 2023-03-27 DIAGNOSIS — C50811 Malignant neoplasm of overlapping sites of right female breast: Secondary | ICD-10-CM | POA: Insufficient documentation

## 2023-03-27 LAB — CBC WITH DIFFERENTIAL (CANCER CENTER ONLY)
Abs Immature Granulocytes: 0.02 10*3/uL (ref 0.00–0.07)
Basophils Absolute: 0.1 10*3/uL (ref 0.0–0.1)
Basophils Relative: 1 %
Eosinophils Absolute: 0.2 10*3/uL (ref 0.0–0.5)
Eosinophils Relative: 3 %
HCT: 35.9 % — ABNORMAL LOW (ref 36.0–46.0)
Hemoglobin: 12.4 g/dL (ref 12.0–15.0)
Immature Granulocytes: 0 %
Lymphocytes Relative: 25 %
Lymphs Abs: 1.5 10*3/uL (ref 0.7–4.0)
MCH: 31.8 pg (ref 26.0–34.0)
MCHC: 34.5 g/dL (ref 30.0–36.0)
MCV: 92.1 fL (ref 80.0–100.0)
Monocytes Absolute: 0.7 10*3/uL (ref 0.1–1.0)
Monocytes Relative: 11 %
Neutro Abs: 3.6 10*3/uL (ref 1.7–7.7)
Neutrophils Relative %: 60 %
Platelet Count: 203 10*3/uL (ref 150–400)
RBC: 3.9 MIL/uL (ref 3.87–5.11)
RDW: 12.7 % (ref 11.5–15.5)
WBC Count: 6 10*3/uL (ref 4.0–10.5)
nRBC: 0 % (ref 0.0–0.2)

## 2023-03-27 LAB — CMP (CANCER CENTER ONLY)
ALT: 21 U/L (ref 0–44)
AST: 32 U/L (ref 15–41)
Albumin: 4.1 g/dL (ref 3.5–5.0)
Alkaline Phosphatase: 53 U/L (ref 38–126)
Anion gap: 7 (ref 5–15)
BUN: 16 mg/dL (ref 8–23)
CO2: 25 mmol/L (ref 22–32)
Calcium: 9.3 mg/dL (ref 8.9–10.3)
Chloride: 102 mmol/L (ref 98–111)
Creatinine: 0.96 mg/dL (ref 0.44–1.00)
GFR, Estimated: >60 mL/min (ref >60)
Glucose, Bld: 83 mg/dL (ref 70–99)
Potassium: 3.9 mmol/L (ref 3.5–5.1)
Sodium: 134 mmol/L — ABNORMAL LOW (ref 135–145)
Total Bilirubin: 0.4 mg/dL (ref 0.3–1.2)
Total Protein: 6.9 g/dL (ref 6.5–8.1)

## 2023-03-27 NOTE — Progress Notes (Signed)
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 [facility]) Staging form: Breast, AJCC 8th Edition - Clinical stage from 06/21/2021: Stage IA (cT1b, cN0, cM0, G1, ER+, PR+, HER2-) - Signed by Malachy Mood, MD on 06/23/2021 Stage prefix: Initial diagnosis Histologic grading system: 3 grade system - Pathologic stage from 07/02/2021: Stage IA (pT1c, pN0, cM0, G1, ER+, PR+, HER2-, Oncotype DX score: 28) - Signed by Malachy Mood, MD on 07/21/2021 Stage prefix: Initial diagnosis Multigene prognostic tests performed: Oncotype DX Recurrence score range: Greater than or equal to 11 Histologic grading system: 3 grade system     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   06/17/2021 Pathology Results   Diagnosis Breast, right, needle core biopsy, 3:00, 4 cmfn  - INVASIVE MAMMARY CARCINOMA, SEE COMMENT  - MAMMARY CARCINOMA IN SITU. The carcinoma appears grade 1. E-cadherin is positive in the invasive and in situ carcinoma consistent with a ductal phenotype.  PROGNOSTIC INDICATORS Results: IMMUNOHISTOCHEMICAL AND MORPHOMETRIC ANALYSIS PERFORMED MANUALLY The tumor cells are EQUIVOCAL for HER2 (2+). Her2 by FISH will be performed and results reported separately. 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 BREAST, RIGHT, LUMPECTOMY:  - Invasive and in situ ductal carcinoma, 1.2 cm.  - Carcinoma is 0.7 cm from superior margin.  - Biopsy site and biopsy clip.  - See oncology table.   B. BREAST, RIGHT ADDITIONAL INFERIOR MARGIN, EXCISION:  - Fibrocystic changes.  - Final inferior margin negative for carcinoma.   C. LYMPH NODE, RIGHT AXILLARY, SENTINEL, EXCISION:  - One lymph node with capsular nevus and negative for metastatic  carcinoma (0/1).   D. LYMPH NODE, RIGHT AXILLARY, SENTINEL, EXCISION:  - One lymph node negative for metastatic carcinoma (0/1).   E. LYMPH NODE, RIGHT AXILLARY, SENTINEL, EXCISION:  - One lymph node negative for metastatic carcinoma (0/1).   F. LYMPH NODE, RIGHT AXILLARY, SENTINEL, EXCISION:  - One lymph node negative for metastatic carcinoma (0/1).   G. LYMPH NODE, RIGHT AXILLARY, SENTINEL, EXCISION:  - One lymph node negative for metastatic carcinoma (0/1).   H. BREAST, RIGHT ADDITIONAL LATERAL MARGIN, EXCISION:  - Fibrocystic changes.  - Final lateral margin negative for carcinoma.   I. BREAST, RIGHT ADDITIONAL POSTERIOR MARGIN, EXCISION:  - Benign breast tissue.  - Final posterior margin negative for carcinoma.    07/02/2021 Cancer Staging   Staging form: Breast, AJCC 8th Edition - Pathologic stage from 07/02/2021: Stage IA (pT1c, pN0, cM0, G1, ER+, PR+, HER2-, Oncotype DX score: 28) - Signed by Malachy Mood, MD on 07/21/2021 Stage prefix: Initial diagnosis Multigene prognostic tests performed: Oncotype DX Recurrence score range: Greater than or equal to 11 Histologic grading system: 3 grade system   03/2022 -  Anti-estrogen oral therapy   10 mg Tamoxifen      INTERVAL HISTORY:  Ms. Goetting to review her survivorship care plan detailing her treatment course for breast cancer, as well as monitoring long-term side effects of that  treatment, education regarding health maintenance, screening, and overall wellness and health promotion.     Overall, Ms. Hugie reports feeling quite well.  She was unable to tolerate any of the antiestrogen pills.  She is struggling because she was recently diagnosed with rheumatoid arthritis and this has been challenging for her.  Otherwise she is feeling well she is just concerned about her risk of breast cancer recurrence since she has not been able to undergo any of the treatments.  REVIEW OF SYSTEMS:  Review of Systems  Constitutional:  Positive for fatigue. Negative for appetite change, chills, fever and unexpected weight change.  HENT:   Negative for hearing loss, lump/mass, mouth sores and trouble swallowing.   Eyes:  Negative for eye problems and icterus.  Respiratory:  Negative for chest tightness, cough and shortness of breath.   Cardiovascular:  Negative for chest pain, leg swelling and palpitations.  Gastrointestinal:  Negative for abdominal distention, abdominal pain, constipation, diarrhea, nausea and vomiting.  Endocrine: Negative for hot flashes.  Genitourinary:  Negative for difficulty urinating.   Musculoskeletal:  Positive for arthralgias.  Skin:  Negative for itching and rash.  Neurological:  Negative for dizziness, extremity weakness, headaches and numbness.  Hematological:  Negative for adenopathy. Does not bruise/bleed easily.  Psychiatric/Behavioral:  Negative for depression. The patient is not nervous/anxious.    Breast: Denies any new nodularity, masses, tenderness, nipple changes, or nipple discharge.        PAST MEDICAL/SURGICAL HISTORY:  Past Medical History:  Diagnosis Date   Barrett's esophagus    Basal cell carcinoma of left lower leg    Breast cancer (HCC)    right   COPD (chronic obstructive pulmonary disease) (HCC) 2018   Depression 1989   Gastric ulcer    GERD (gastroesophageal reflux disease)    History of colon polyps    Hyperlipidemia     Osteopenia    Past Surgical History:  Procedure Laterality Date   ABDOMINAL HYSTERECTOMY  11/21/1989   APPENDECTOMY  1969   BREAST LUMPECTOMY  07/22/2006   BREAST LUMPECTOMY WITH RADIOACTIVE SEED AND SENTINEL LYMPH NODE BIOPSY Right 07/02/2021   Procedure: RIGHT BREAST LUMPECTOMY WITH RADIOACTIVE SEED AND RIGHT AXILLARY SENTINEL LYMPH NODE BIOPSY;  Surgeon: Emelia Loron, MD;  Location: [facility] OR;  Service: General;  Laterality: Right;   CATARACT EXTRACTION, BILATERAL     EYE SURGERY  08/2013  10/2013   cataracts   TONSILLECTOMY       ALLERGIES:  Allergies  Allergen Reactions   Statins Other (See Comments)    Myalgia    Amoxicillin-Pot Clavulanate Nausea And Vomiting   Moxifloxacin Nausea And Vomiting     CURRENT MEDICATIONS:  Outpatient Encounter Medications as of 03/27/2023  Medication Sig   Biotin w/ Vitamins C & E (HAIR SKIN & NAILS GUMMIES PO) Take 2 each by mouth daily.   Calcium-Magnesium-Zinc (CAL-MAG-ZINC PO) Take 2 tablets by mouth daily.   cholecalciferol (VITAMIN D3) 25 MCG (1000 UNIT) tablet Take 1,000 Units by mouth daily.   Efinaconazole 10 % SOLN Apply 1 drop topically daily.   ezetimibe (ZETIA) 10 MG tablet TAKE ONE TABLET BY MOUTH ONE TIME DAILY   fluticasone (FLONASE) 50 MCG/ACT nasal spray Place 2 sprays into both nostrils daily. (Patient taking  differently: Place 2 sprays into both nostrils daily as needed for allergies.)   fluticasone-salmeterol (ADVAIR) 100-50 MCG/ACT AEPB INHALE ONE PUFF BY MOUTH TWICE A DAY   gabapentin (NEURONTIN) 100 MG capsule Take 1 capsule (100 mg total) by mouth 3 (three) times daily.   hydrocortisone (ANUSOL-HC) 2.5 % rectal cream Place 1 application rectally 2 (two) times daily as needed for hemorrhoids or anal itching.   loratadine (CLARITIN) 10 MG tablet Take 1 tablet (10 mg total) by mouth daily.   Omega-3 Fatty Acids (FISH OIL) 1200 MG CAPS Take 1,200 mg by mouth daily.   ondansetron (ZOFRAN ODT) 4 MG disintegrating  tablet Take 1 tablet (4 mg total) by mouth every 8 (eight) hours as needed for nausea or vomiting.   ondansetron (ZOFRAN) 4 MG tablet Take 1 tablet (4 mg total) by mouth every 8 (eight) hours as needed for nausea or vomiting.   pantoprazole (PROTONIX) 40 MG tablet TAKE ONE TABLET BY MOUTH ONE TIME DAILY BEFORE BREAKFAST   promethazine (PHENERGAN) 25 MG tablet Take 1 tablet (25 mg total) by mouth every 8 (eight) hours as needed for nausea or vomiting.   promethazine-dextromethorphan (PROMETHAZINE-DM) 6.25-15 MG/5ML syrup Take 5 mLs by mouth 4 (four) times daily as needed.   vitamin C (ASCORBIC ACID) 250 MG tablet Take 250 mg by mouth daily.   XIIDRA 5 % SOLN Place 1 drop into both eyes in the morning and at bedtime.   zolpidem (AMBIEN) 5 MG tablet TAKE ONE TABLET BY MOUTH AT BEDTIME AS NEEDED FOR SLEEP   No facility-administered encounter medications on file as of 03/27/2023.     ONCOLOGIC FAMILY HISTORY:  Family History  Problem Relation Age of Onset   Heart failure Mother        CHF   Hyperlipidemia Mother    Aortic stenosis Mother    Arthritis Mother    Heart disease Mother    Lung cancer Father 30   Cancer Father    Early death Father    Breast cancer Sister 61   Cancer Sister    Depression Sister    Stroke Maternal Aunt    Varicose Veins Maternal Aunt    Diabetes Maternal Grandmother    Hyperlipidemia Maternal Grandmother    Colon cancer Maternal Grandfather    Cancer Maternal Grandfather        colon cancer   Cancer Paternal Grandfather        unknown type cancer   Drug abuse Daughter    Heart attack Neg Hx    Hypertension Neg Hx    Sudden death Neg Hx    Esophageal cancer Neg Hx    Stomach cancer Neg Hx    Rectal cancer Neg Hx     SOCIAL HISTORY:  Social History   Socioeconomic History   Marital status: Married    Spouse name: Not on file   Number of children: 1   Years of education: Not on file   Highest education level: Not on file  Occupational History    Occupation: retired    Associate Professor: [employer]    Comment: retired  Tobacco Use   Smoking status: Former    Packs/day: 0.50    Years: 40.00    Additional pack years: 0.00    Total pack years: 20.00    Types: Cigarettes, E-cigarettes    Quit date: 07/23/2011    Years since quitting: 11.6   Smokeless tobacco: Never   Tobacco comments:    The  last 5 yrs. I smoked .5 pack a day. Before that, 1 pk. a day  Vaping Use   Vaping Use: Former   Substances: Nicotine  Substance and Sexual Activity   Alcohol use: No    Comment: occasional wine   Drug use: No   Sexual activity: Not Currently    Partners: Male    Birth control/protection: Post-menopausal  Other Topics Concern   Not on file  Social History Narrative   Exercise-virtual class, MWF   Social Determinants of Health   Financial Resource Strain: Low Risk  (08/02/2022)   Overall Financial Resource Strain (CARDIA)    Difficulty of Paying Living Expenses: Not very hard  Food Insecurity: No Food Insecurity (08/02/2022)   Hunger Vital Sign    Worried About Running Out of Food in the Last Year: Never true    Ran Out of Food in the Last Year: Never true  Transportation Needs: No Transportation Needs (08/02/2022)   PRAPARE - Administrator, Civil Service (Medical): No    Lack of Transportation (Non-Medical): No  Physical Activity: Sufficiently Active (08/02/2022)   Exercise Vital Sign    Days of Exercise per Week: 3 days    Minutes of Exercise per Session: 90 min  Stress: No Stress Concern Present (08/02/2022)   [employer] of Occupational Health - Occupational Stress Questionnaire    Feeling of Stress : Not at all  Social Connections: Moderately Isolated (08/02/2022)   Social Connection and Isolation Panel [NHANES]    Frequency of Communication with Friends and Family: More than three times a week    Frequency of Social Gatherings with Friends and Family: More than three times a week    Attends Religious  Services: Never    Database administrator or Organizations: No    Attends Banker Meetings: Never    Marital Status: Married  Catering manager Violence: Not At Risk (08/02/2022)   Humiliation, Afraid, Rape, and Kick questionnaire    Fear of Current or Ex-Partner: No    Emotionally Abused: No    Physically Abused: No    Sexually Abused: No     OBSERVATIONS/OBJECTIVE:  BP (!) 152/70 (BP Location: Right Arm, Patient Position: Sitting)   Pulse 83   Temp 98 F (36.7 C) (Tympanic)   Resp 18   Ht 5\' 2"  (1.575 m)   Wt 123 lb 12.8 oz (56.2 kg)   SpO2 100%   BMI 22.64 kg/m  GENERAL: Patient is a well appearing female in no acute distress HEENT:  Sclerae anicteric.  Oropharynx clear and moist. No ulcerations or evidence of oropharyngeal candidiasis. Neck is supple.  NODES:  No cervical, supraclavicular, or axillary lymphadenopathy palpated.  BREAST EXAM: Right breast status postlumpectomy and radiation no sign of local recurrence left breast is benign. LUNGS:  Clear to auscultation bilaterally.  No wheezes or rhonchi. HEART:  Regular rate and rhythm. No murmur appreciated. ABDOMEN:  Soft, nontender.  Positive, normoactive bowel sounds. No organomegaly palpated. MSK:  No focal spinal tenderness to palpation. Full range of motion bilaterally in the upper extremities. EXTREMITIES:  No peripheral edema.   SKIN:  Clear with no obvious rashes or skin changes. No nail dyscrasia. NEURO:  Nonfocal. Well oriented.  Appropriate affect.  LABORATORY DATA:  None for this visit.  DIAGNOSTIC IMAGING:  None for this visit.      ASSESSMENT AND PLAN:  Ms.. Roso is a pleasant 77 y.o. 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  She presents to the Survivorship Clinic for our initial meeting and routine follow-up post-completion of treatment for  breast cancer.    1. Stage 1A recurrent right breast cancer:  Ms. Chao is continuing to recover from definitive treatment for breast cancer. She will follow-up with her medical oncologist, Dr. Mosetta Putt in 6 months with history and physical exam per surveillance protocol.  . Her mammogram is [facility]/20[facility] at [facility]; orders placed today.   Today, a comprehensive survivorship care plan and treatment summary was reviewed with the patient today detailing her breast cancer diagnosis, treatment course, potential late/long-term effects of treatment, appropriate follow-up care with recommendations for the future, and patient education resources.  A copy of this summary, along with a letter will be sent to the patient's primary care provider via mail/fax/In Basket message after today's visit.    2. Bone health: She plans on following up with rheumatology about her bone health.  Her most recent bone density testing demonstrated osteoporosis with a T-score -2.8 in the left femur.  Since she is no longer going to receive any medications that could potentially decrease her bone density I recommended she follow-up with rheumatology or her primary care provider about future bone density testing or consideration of bisphosphonate therapy if so desired.  She was given education on specific activities to promote bone health.  3. Cancer screening:  Due to Ms. Hameister history and her age, she should receive screening for skin cancers, colon cancer, and lung cancer.  The information and recommendations are listed on the patient's comprehensive care plan/treatment summary and were reviewed in detail with the patient.    4. Health maintenance and wellness promotion: Ms. Losano was encouraged to consume 5-7 servings of fruits and vegetables per day. We reviewed the "Nutrition Rainbow" handout.  She was also encouraged to engage in moderate to vigorous exercise for 30 minutes per day most days of the week. She was instructed to limit her  alcohol consumption and continue to abstain from tobacco use.     5. Support services/counseling: It is not uncommon for this period of the patient's cancer care trajectory to be one of many emotions and stressors. She was given information regarding our available services and encouraged to contact me with any questions or for help enrolling in any of our support group/programs.    Follow up instructions:    -Return to cancer center in 6 months for follow-up -Mammogram due in 06/2023 -She is welcome to return back to the Survivorship Clinic at any time; no additional follow-up needed at this time.  -Consider referral back to survivorship as a long-term survivor for continued surveillance  The patient was provided an opportunity to ask questions and all were answered. The patient agreed with the plan and demonstrated an understanding of the instructions.   Total encounter time:30 minutes*in face-to-face visit time, chart review, lab review, care coordination, order entry, and documentation of the encounter time.    Lillard Anes, NP 03/27/23 2:07 PM Medical Oncology and Hematology [facility] [address] [place], [place] [address] Tel. [phone]    Fax. [phone]  *Total Encounter Time as defined by the Centers for Medicare and Medicaid Services includes, in addition to the face-to-face time of a patient visit (documented in the note above) non-face-to-face time: obtaining and reviewing outside history, ordering and reviewing medications, tests or procedures, care coordination (communications with other health  care professionals or caregivers) and documentation in the medical record.

## 2023-03-28 ENCOUNTER — Encounter: Payer: Self-pay | Admitting: Adult Health

## 2023-04-11 ENCOUNTER — Other Ambulatory Visit: Payer: Self-pay | Admitting: Family Medicine

## 2023-04-11 DIAGNOSIS — R110 Nausea: Secondary | ICD-10-CM

## 2023-05-15 ENCOUNTER — Ambulatory Visit (INDEPENDENT_AMBULATORY_CARE_PROVIDER_SITE_OTHER): Payer: Medicare PPO | Admitting: Family Medicine

## 2023-05-15 ENCOUNTER — Encounter: Payer: Self-pay | Admitting: Family Medicine

## 2023-05-15 VITALS — BP 122/80 | HR 88 | Temp 98.1°F | Resp 18 | Ht 62.0 in | Wt 122.0 lb

## 2023-05-15 DIAGNOSIS — K649 Unspecified hemorrhoids: Secondary | ICD-10-CM | POA: Insufficient documentation

## 2023-05-15 DIAGNOSIS — M0579 Rheumatoid arthritis with rheumatoid factor of multiple sites without organ or systems involvement: Secondary | ICD-10-CM | POA: Insufficient documentation

## 2023-05-15 DIAGNOSIS — R221 Localized swelling, mass and lump, neck: Secondary | ICD-10-CM | POA: Diagnosis not present

## 2023-05-15 MED ORDER — HYDROXYCHLOROQUINE SULFATE 200 MG PO TABS: 200.0000 mg | ORAL_TABLET | Freq: Every day | ORAL | 5 refills | Status: AC

## 2023-05-15 MED ORDER — HYDROCORTISONE (PERIANAL) 2.5 % EX CREA: 1.0000 | TOPICAL_CREAM | Freq: Two times a day (BID) | CUTANEOUS | 1 refills | Status: DC | PRN

## 2023-05-15 NOTE — Assessment & Plan Note (Signed)
?   Thyroid Check US neck and labs  F/u after thyroid

## 2023-05-15 NOTE — Assessment & Plan Note (Signed)
Per rheum Pt concerned about nodules developing in neck

## 2023-05-15 NOTE — Progress Notes (Signed)
Established Patient Office Visit  Subjective   Patient ID: Samantha Miranda, female    DOB: 03-Oct-1946  Age: 77 y.o. MRN: [id number]  Chief Complaint  Patient presents with   Hoarse    X2-3 weeks, pt states feels a fullness in her throat. No pain with swallowing.     HPI Discussed the use of AI scribe software for clinical note transcription with the patient, who gave verbal consent to proceed.  History of Present Illness   The patient, with a history of rheumatoid arthritis (RA), presents with a 2-3 week history of voice changes and a sensation of throat fullness. She describes her voice as 'raspy' and notes that it 'comes and goes,' with episodes of voice loss by the end of the day. She denies pain, but describes a sensation of fullness in the throat and occasional shortness of breath. She denies dysphagia and congestion. She expresses concern about the development of nodules in the throat secondary to RA.  The patient's RA is currently managed by Dr. Kathlen Mody with Plaquenil, which has been well-tolerated with minimal side effects, aside from itching. She reports significant improvement in her RA symptoms compared to the previous year, when she experienced severe mobility limitations. She has tried multiple other medications for RA, including methotrexate, Leflunomide, and sulfasalazine, but these were not tolerated due to side effects and impact on kidney function.      Patient Active Problem List   Diagnosis Date Noted   Rheumatoid arthritis involving multiple sites with positive rheumatoid factor (HCC) 05/15/2023   Hemorrhoids 05/15/2023   Neck nodule 05/15/2023   COVID-19 06/20/2022   Acute pain of both shoulders 04/26/2022   Bunion, right 02/07/2022   DOE (dyspnea on exertion) 12/20/2021   Pulmonary emphysema, unspecified emphysema type (HCC) 12/20/2021   Atherosclerosis of aorta (HCC) 12/20/2021   Gastroenteritis 08/23/2021   Preventative health care 07/22/2021   Insomnia  07/22/2021   History of peptic ulcer 07/22/2021   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 (HCC) 06/23/2021   Depression, major, single episode, 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   Influenza vaccine administered 11/02/2015   Otitis media 08/10/2015   Acute bronchitis 04/21/2015   Right ankle pain 02/10/2014   Left shoulder pain 11/22/2013   Right shoulder pain 08/20/2013   Near syncope 08/12/2013   Other malaise and fatigue 08/12/2013   Rectal itching 07/01/2013   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   Gastroesophageal reflux disease 08/12/2012   Former heavy tobacco smoker 08/26/2010   COLONIC POLYPS, HX OF 08/26/2010   POSTMENOPAUSAL STATUS 08/26/2010   POSTMENOPAUSAL BLEEDING 04/13/2009   Hyperlipidemia 07/11/2007   DEPRESSION 07/11/2007   OSTEOPENIA 07/11/2007   BREAST CANCER, HX OF 07/11/2007   Past Medical History:  Diagnosis Date   Barrett's esophagus    Basal cell carcinoma of left lower leg    Breast cancer (HCC)    right   COPD (chronic obstructive pulmonary disease) (HCC) 2018   Depression 1989   Gastric ulcer    GERD (gastroesophageal reflux disease)    History of colon polyps    Hyperlipidemia    Osteopenia    Past Surgical History:  Procedure Laterality Date   ABDOMINAL HYSTERECTOMY  11/21/1989   APPENDECTOMY  1969   BREAST LUMPECTOMY  07/22/2006   BREAST LUMPECTOMY WITH RADIOACTIVE SEED AND SENTINEL LYMPH NODE BIOPSY Right 07/02/2021   Procedure: RIGHT BREAST LUMPECTOMY WITH RADIOACTIVE SEED AND RIGHT AXILLARY SENTINEL LYMPH NODE BIOPSY;  Surgeon: Emelia Loron, MD;  Location: [facility]  OR;  Service: General;  Laterality: Right;   CATARACT EXTRACTION, BILATERAL     EYE SURGERY  08/2013  10/2013   cataracts   TONSILLECTOMY     Social History   Tobacco Use   Smoking status: Former    Packs/day: 0.50    Years: 40.00    Additional pack years: 0.00    Total pack years: 20.00    Types: Cigarettes, E-cigarettes    Quit date: 07/23/2011    Years since  quitting: 11.8   Smokeless tobacco: Never   Tobacco comments:    The last 5 yrs. I smoked .5 pack a day. Before that, 1 pk. a day  Vaping Use   Vaping Use: Former   Substances: Nicotine  Substance Use Topics   Alcohol use: No    Comment: occasional wine   Drug use: No   Social History   Socioeconomic History   Marital status: Married    Spouse name: Not on file   Number of children: 1   Years of education: Not on file   Highest education level: 12th grade  Occupational History   Occupation: retired    Associate Professor: [employer]    Comment: retired  Tobacco Use   Smoking status: Former    Packs/day: 0.50    Years: 40.00    Additional pack years: 0.00    Total pack years: 20.00    Types: Cigarettes, E-cigarettes    Quit date: 07/23/2011    Years since quitting: 11.8   Smokeless tobacco: Never   Tobacco comments:    The last 5 yrs. I smoked .5 pack a day. Before that, 1 pk. a day  Vaping Use   Vaping Use: Former   Substances: Nicotine  Substance and Sexual Activity   Alcohol use: No    Comment: occasional wine   Drug use: No   Sexual activity: Not Currently    Partners: Male    Birth control/protection: Post-menopausal  Other Topics Concern   Not on file  Social History Narrative   Exercise-virtual class, MWF   Social Determinants of Health   Financial Resource Strain: Low Risk  (05/12/2023)   Overall Financial Resource Strain (CARDIA)    Difficulty of Paying Living Expenses: Not hard at all  Food Insecurity: No Food Insecurity (05/12/2023)   Hunger Vital Sign    Worried About Running Out of Food in the Last Year: Never true    Ran Out of Food in the Last Year: Never true  Transportation Needs: No Transportation Needs (05/12/2023)   PRAPARE - Administrator, Civil Service (Medical): No    Lack of Transportation (Non-Medical): No  Physical Activity: Sufficiently Active (05/12/2023)   Exercise Vital Sign    Days of Exercise per Week: 3 days     Minutes of Exercise per Session: 60 min  Stress: No Stress Concern Present (05/12/2023)   [employer] of Occupational Health - Occupational Stress Questionnaire    Feeling of Stress : Only a little  Social Connections: Moderately Isolated (05/12/2023)   Social Connection and Isolation Panel [NHANES]    Frequency of Communication with Friends and Family: Twice a week    Frequency of Social Gatherings with Friends and Family: Once a week    Attends Religious Services: Never    Database administrator or Organizations: No    Attends Banker Meetings: Never    Marital Status: Married  Catering manager Violence: Not At Risk (08/02/2022)  Humiliation, Afraid, Rape, and Kick questionnaire    Fear of Current or Ex-Partner: No    Emotionally Abused: No    Physically Abused: No    Sexually Abused: No   Family Status  Relation Name Status   Mother Dot Donia Pounds Deceased at age 34   Father Rayna Sexton Long Deceased   Sister Bennye Alm Deceased at age 25       breast ca   Mat Aunt Colin Benton (Not Specified)   MGM Britt Bottom (Not Specified)   MGF Dian Queen (Not Specified)   PGF Grace Bushy (Not Specified)   Daughter Jarrett Soho (Not Specified)   Neg Hx  (Not Specified)   Family History  Problem Relation Age of Onset   Heart failure Mother        CHF   Hyperlipidemia Mother    Aortic stenosis Mother    Arthritis Mother    Heart disease Mother    Lung cancer Father 69   Cancer Father    Early death Father    Breast cancer Sister 2   Cancer Sister    Depression Sister    Stroke Maternal Aunt    Varicose Veins Maternal Aunt    Diabetes Maternal Grandmother    Hyperlipidemia Maternal Grandmother    Colon cancer Maternal Grandfather    Cancer Maternal Grandfather        colon cancer   Cancer Paternal Grandfather        unknown type cancer   Drug abuse Daughter    Heart attack Neg Hx    Hypertension Neg Hx    Sudden death Neg Hx    Esophageal cancer Neg Hx     Stomach cancer Neg Hx    Rectal cancer Neg Hx    Allergies  Allergen Reactions   Statins Other (See Comments)    Myalgia    Amoxicillin-Pot Clavulanate Nausea And Vomiting   Moxifloxacin Nausea And Vomiting      Review of Systems  Constitutional:  Negative for fever and malaise/fatigue.  HENT:  Negative for congestion.   Eyes:  Negative for blurred vision.  Respiratory:  Negative for shortness of breath.   Cardiovascular:  Negative for chest pain, palpitations and leg swelling.  Gastrointestinal:  Negative for abdominal pain, blood in stool and nausea.  Genitourinary:  Negative for dysuria and frequency.  Musculoskeletal:  Negative for falls.  Skin:  Negative for rash.  Neurological:  Negative for dizziness, loss of consciousness and headaches.  Endo/Heme/Allergies:  Negative for environmental allergies.  Psychiatric/Behavioral:  Negative for depression. The patient is not nervous/anxious.       Objective:     BP 122/80 (BP Location: Left Arm, Patient Position: Sitting, Cuff Size: Normal)   Pulse 88   Temp 98.1 F (36.7 C) (Oral)   Resp 18   Ht 5\' 2"  (1.575 m)   Wt 122 lb (55.3 kg)   SpO2 97%   BMI 22.31 kg/m  BP Readings from Last 3 Encounters:  05/15/23 122/80  03/27/23 (!) 152/70  09/26/22 (!) 155/78   Wt Readings from Last 3 Encounters:  05/15/23 122 lb (55.3 kg)  03/27/23 123 lb 12.8 oz (56.2 kg)  09/26/22 122 lb 1.6 oz (55.4 kg)   SpO2 Readings from Last 3 Encounters:  05/15/23 97%  03/27/23 100%  09/26/22 97%      Physical Exam Vitals and nursing note reviewed.  Constitutional:      Appearance: She is well-developed.  HENT:  Head: Normocephalic and atraumatic.  Eyes:     Conjunctiva/sclera: Conjunctivae normal.  Neck:     Thyroid: Thyromegaly present. No thyroid mass or thyroid tenderness.     Vascular: No carotid bruit or JVD.  Cardiovascular:     Rate and Rhythm: Normal rate and regular rhythm.     Heart sounds: Normal heart  sounds. No murmur heard. Pulmonary:     Effort: Pulmonary effort is normal. No respiratory distress.     Breath sounds: Normal breath sounds. No wheezing or rales.  Chest:     Chest wall: No tenderness.  Musculoskeletal:     Cervical back: Normal range of motion and neck supple.  Lymphadenopathy:     Cervical: No cervical adenopathy.  Neurological:     Mental Status: She is alert and oriented to person, place, and time.      No results found for any visits on 05/15/23.  Last CBC Lab Results  Component Value Date   WBC 6.0 03/27/2023   HGB 12.4 03/27/2023   HCT 35.9 (L) 03/27/2023   MCV 92.1 03/27/2023   MCH 31.8 03/27/2023   RDW 12.7 03/27/2023   PLT 203 03/27/2023   Last metabolic panel Lab Results  Component Value Date   GLUCOSE 83 03/27/2023   NA 134 (L) 03/27/2023   K 3.9 03/27/2023   CL 102 03/27/2023   CO2 25 03/27/2023   BUN 16 03/27/2023   CREATININE 0.96 03/27/2023   GFRNONAA >60 03/27/2023   CALCIUM 9.3 03/27/2023   PROT 6.9 03/27/2023   ALBUMIN 4.1 03/27/2023   BILITOT 0.4 03/27/2023   ALKPHOS 53 03/27/2023   AST 32 03/27/2023   ALT 21 03/27/2023   ANIONGAP 7 03/27/2023   Last lipids Lab Results  Component Value Date   CHOL 241 (H) 01/31/2023   HDL 82.60 01/31/2023   LDLCALC 144 (H) 01/31/2023   LDLDIRECT 176.2 08/12/2013   TRIG 68.0 01/31/2023   CHOLHDL 3 01/31/2023   Last hemoglobin A1c Lab Results  Component Value Date   HGBA1C 5.1 08/04/2017   Last thyroid functions Lab Results  Component Value Date   TSH 1.07 08/04/2017   Last vitamin D Lab Results  Component Value Date   VD25OH 87.21 08/02/2022   Last vitamin B12 and Folate No results found for: "VITAMINB12", "FOLATE"    The 10-year ASCVD risk score (Arnett DK, et al., 2019) is: 16.9%    Assessment & Plan:   Problem List Items Addressed This Visit       Unprioritized   Rheumatoid arthritis involving multiple sites with positive rheumatoid factor (HCC) - Primary     Per rheum Pt concerned about nodules developing in neck        Relevant Medications   hydroxychloroquine (PLAQUENIL) 200 MG tablet   Other Relevant Orders   US SOFT TISSUE HEAD & NECK (NON-THYROID)   Thyroid Panel With TSH   Neck nodule    ? Thyroid Check US neck and labs  F/u after thyroid      Hemorrhoids   Relevant Medications   hydrocortisone (ANUSOL-HC) 2.5 % rectal cream  Assessment and Plan    Voice Changes: Patient reports intermittent voice changes and sensation of fullness in throat for the past 2-3 weeks. No dysphagia or congestion. Concerned about possible nodules related to rheumatoid arthritis. -Order neck ultrasound to evaluate for any structural abnormalities. -Check thyroid function tests to rule out thyroid-related issues.  Rheumatoid Arthritis: Currently managed by Dr. Kathlen Mody. Patient reports improvement with Plaquenil  200mg  daily after intolerance to Methotrexate and Leflunomide. -Continue current management with Plaquenil 200mg  daily. -Follow-up with Dr. Kathlen Mody in July.        No follow-ups on file.    Donato Schultz, DO

## 2023-05-16 ENCOUNTER — Other Ambulatory Visit: Payer: Self-pay | Admitting: Family Medicine

## 2023-05-16 ENCOUNTER — Ambulatory Visit (HOSPITAL_BASED_OUTPATIENT_CLINIC_OR_DEPARTMENT_OTHER)
Admission: RE | Admit: 2023-05-16 | Discharge: 2023-05-16 | Disposition: A | Discharge status: Home / Self Care | Payer: Medicare PPO | Source: Ambulatory Visit | Attending: Family Medicine | Admitting: Family Medicine

## 2023-05-16 DIAGNOSIS — R221 Localized swelling, mass and lump, neck: Secondary | ICD-10-CM | POA: Diagnosis not present

## 2023-05-16 DIAGNOSIS — K649 Unspecified hemorrhoids: Secondary | ICD-10-CM

## 2023-05-16 DIAGNOSIS — E042 Nontoxic multinodular goiter: Secondary | ICD-10-CM | POA: Diagnosis not present

## 2023-05-16 DIAGNOSIS — M0579 Rheumatoid arthritis with rheumatoid factor of multiple sites without organ or systems involvement: Secondary | ICD-10-CM | POA: Diagnosis not present

## 2023-05-16 LAB — THYROID PANEL WITH TSH
Free Thyroxine Index: 2.1 (ref 1.4–3.8)
T3 Uptake: 27 % (ref 22–35)
T4, Total: 7.6 ug/dL (ref 5.1–11.9)
TSH: 1.31 mIU/L (ref 0.40–4.50)

## 2023-05-19 ENCOUNTER — Other Ambulatory Visit: Payer: Self-pay | Admitting: Family Medicine

## 2023-05-19 DIAGNOSIS — R221 Localized swelling, mass and lump, neck: Secondary | ICD-10-CM

## 2023-05-19 DIAGNOSIS — E041 Nontoxic single thyroid nodule: Secondary | ICD-10-CM

## 2023-05-22 ENCOUNTER — Encounter: Payer: Self-pay | Admitting: Family Medicine

## 2023-05-22 DIAGNOSIS — E041 Nontoxic single thyroid nodule: Secondary | ICD-10-CM

## 2023-05-28 ENCOUNTER — Encounter: Payer: Self-pay | Admitting: Hematology

## 2023-06-16 ENCOUNTER — Ambulatory Visit
Admission: RE | Admit: 2023-06-16 | Discharge: 2023-06-16 | Disposition: A | Discharge status: Home / Self Care | Payer: Medicare PPO | Source: Ambulatory Visit | Attending: Family Medicine | Admitting: Family Medicine

## 2023-06-16 ENCOUNTER — Other Ambulatory Visit (HOSPITAL_COMMUNITY)
Admission: RE | Admit: 2023-06-16 | Discharge: 2023-06-16 | Disposition: A | Discharge status: Home / Self Care | Payer: Medicare PPO | Source: Ambulatory Visit | Attending: Family Medicine | Admitting: Family Medicine

## 2023-06-16 DIAGNOSIS — E041 Nontoxic single thyroid nodule: Secondary | ICD-10-CM

## 2023-06-16 DIAGNOSIS — D34 Benign neoplasm of thyroid gland: Secondary | ICD-10-CM | POA: Insufficient documentation

## 2023-06-21 DIAGNOSIS — Z79899 Other long term (current) drug therapy: Secondary | ICD-10-CM | POA: Diagnosis not present

## 2023-06-21 DIAGNOSIS — M79643 Pain in unspecified hand: Secondary | ICD-10-CM | POA: Diagnosis not present

## 2023-06-21 DIAGNOSIS — H04129 Dry eye syndrome of unspecified lacrimal gland: Secondary | ICD-10-CM | POA: Diagnosis not present

## 2023-06-21 DIAGNOSIS — M0579 Rheumatoid arthritis with rheumatoid factor of multiple sites without organ or systems involvement: Secondary | ICD-10-CM | POA: Diagnosis not present

## 2023-06-21 DIAGNOSIS — M7989 Other specified soft tissue disorders: Secondary | ICD-10-CM | POA: Diagnosis not present

## 2023-06-21 DIAGNOSIS — M1990 Unspecified osteoarthritis, unspecified site: Secondary | ICD-10-CM | POA: Diagnosis not present

## 2023-07-05 ENCOUNTER — Other Ambulatory Visit: Payer: Medicare PPO

## 2023-07-05 ENCOUNTER — Institutional Professional Consult (permissible substitution) (INDEPENDENT_AMBULATORY_CARE_PROVIDER_SITE_OTHER): Payer: Medicare PPO | Admitting: Otolaryngology

## 2023-07-11 DIAGNOSIS — Z883 Allergy status to other anti-infective agents status: Secondary | ICD-10-CM | POA: Diagnosis not present

## 2023-07-11 DIAGNOSIS — Z853 Personal history of malignant neoplasm of breast: Secondary | ICD-10-CM | POA: Diagnosis not present

## 2023-07-11 LAB — HM MAMMOGRAPHY

## 2023-07-12 ENCOUNTER — Encounter: Payer: Self-pay | Admitting: Family Medicine

## 2023-07-13 DIAGNOSIS — Z85828 Personal history of other malignant neoplasm of skin: Secondary | ICD-10-CM | POA: Diagnosis not present

## 2023-07-13 DIAGNOSIS — D225 Melanocytic nevi of trunk: Secondary | ICD-10-CM | POA: Diagnosis not present

## 2023-07-13 DIAGNOSIS — L821 Other seborrheic keratosis: Secondary | ICD-10-CM | POA: Diagnosis not present

## 2023-07-13 DIAGNOSIS — Z08 Encounter for follow-up examination after completed treatment for malignant neoplasm: Secondary | ICD-10-CM | POA: Diagnosis not present

## 2023-07-13 DIAGNOSIS — L814 Other melanin hyperpigmentation: Secondary | ICD-10-CM | POA: Diagnosis not present

## 2023-07-20 ENCOUNTER — Other Ambulatory Visit: Payer: Self-pay

## 2023-07-21 ENCOUNTER — Other Ambulatory Visit: Payer: Self-pay | Admitting: Hematology

## 2023-07-21 MED ORDER — GABAPENTIN 100 MG PO CAPS: 100.0000 mg | ORAL_CAPSULE | Freq: Three times a day (TID) | ORAL | 0 refills | Status: DC

## 2023-07-28 DIAGNOSIS — L723 Sebaceous cyst: Secondary | ICD-10-CM | POA: Diagnosis not present

## 2023-07-28 DIAGNOSIS — D4819 Other specified neoplasm of uncertain behavior of connective and other soft tissue: Secondary | ICD-10-CM | POA: Diagnosis not present

## 2023-08-29 ENCOUNTER — Other Ambulatory Visit: Payer: Self-pay | Admitting: Family Medicine

## 2023-08-29 DIAGNOSIS — K219 Gastro-esophageal reflux disease without esophagitis: Secondary | ICD-10-CM

## 2023-09-14 ENCOUNTER — Ambulatory Visit (INDEPENDENT_AMBULATORY_CARE_PROVIDER_SITE_OTHER): Payer: Medicare PPO | Admitting: *Deleted

## 2023-09-14 DIAGNOSIS — Z0000 Encounter for general adult medical examination without abnormal findings: Secondary | ICD-10-CM | POA: Diagnosis not present

## 2023-09-14 NOTE — Patient Instructions (Signed)
Samantha Miranda , Thank you for taking time to come for your Medicare Wellness Visit. I appreciate your ongoing commitment to your health goals. Please review the following plan we discussed and let me know if I can assist you in the future.     This is a list of the screening recommended for you and due dates:  Health Maintenance  Topic Date Due   COVID-19 Vaccine (5 - 2023-24 season) 07/23/2023   Flu Shot  02/19/2024*   Screening for Lung Cancer  11/02/2023   Mammogram  07/10/2024   Medicare Annual Wellness Visit  09/13/2024   DTaP/Tdap/Td vaccine (3 - Tdap) 05/06/2031   Pneumonia Vaccine  Completed   DEXA scan (bone density measurement)  Completed   Hepatitis C Screening  Completed   Zoster (Shingles) Vaccine  Completed   HPV Vaccine  Aged Out   Colon Cancer Screening  Discontinued  *Topic was postponed. The date shown is not the original due date.    Next appointment: Follow up in one year for your annual wellness visit.   Preventive Care 13 Years and Older, Female Preventive care refers to lifestyle choices and visits with your health care provider that can promote health and wellness. What does preventive care include? A yearly physical exam. This is also called an annual well check. Dental exams once or twice a year. Routine eye exams. Ask your health care provider how often you should have your eyes checked. Personal lifestyle choices, including: Daily care of your teeth and gums. Regular physical activity. Eating a healthy diet. Avoiding tobacco and drug use. Limiting alcohol use. Practicing safe sex. Taking low-dose aspirin every day. Taking vitamin and mineral supplements as recommended by your health care provider. What happens during an annual well check? The services and screenings done by your health care provider during your annual well check will depend on your age, overall health, lifestyle risk factors, and family history of disease. Counseling  Your health  care provider may ask you questions about your: Alcohol use. Tobacco use. Drug use. Emotional well-being. Home and relationship well-being. Sexual activity. Eating habits. History of falls. Memory and ability to understand (cognition). Work and work Astronomer. Reproductive health. Screening  You may have the following tests or measurements: Height, weight, and BMI. Blood pressure. Lipid and cholesterol levels. These may be checked every 5 years, or more frequently if you are over 31 years old. Skin check. Lung cancer screening. You may have this screening every year starting at age 81 if you have a 30-pack-year history of smoking and currently smoke or have quit within the past 15 years. Fecal occult blood test (FOBT) of the stool. You may have this test every year starting at age 29. Flexible sigmoidoscopy or colonoscopy. You may have a sigmoidoscopy every 5 years or a colonoscopy every 10 years starting at age 55. Hepatitis C blood test. Hepatitis B blood test. Sexually transmitted disease (STD) testing. Diabetes screening. This is done by checking your blood sugar (glucose) after you have not eaten for a while (fasting). You may have this done every 1-3 years. Bone density scan. This is done to screen for osteoporosis. You may have this done starting at age 40. Mammogram. This may be done every 1-2 years. Talk to your health care provider about how often you should have regular mammograms. Talk with your health care provider about your test results, treatment options, and if necessary, the need for more tests. Vaccines  Your health care provider may recommend  certain vaccines, such as: Influenza vaccine. This is recommended every year. Tetanus, diphtheria, and acellular pertussis (Tdap, Td) vaccine. You may need a Td booster every 10 years. Zoster vaccine. You may need this after age 51. Pneumococcal 13-valent conjugate (PCV13) vaccine. One dose is recommended after age  14. Pneumococcal polysaccharide (PPSV23) vaccine. One dose is recommended after age 49. Talk to your health care provider about which screenings and vaccines you need and how often you need them. This information is not intended to replace advice given to you by your health care provider. Make sure you discuss any questions you have with your health care provider. Document Released: 12/04/2015 Document Revised: 07/27/2016 Document Reviewed: 09/08/2015 Elsevier Interactive Patient Education  2017 [employer].  Fall Prevention in the Home Falls can cause injuries. They can happen to people of all ages. There are many things you can do to make your home safe and to help prevent falls. What can I do on the outside of my home? Regularly fix the edges of walkways and driveways and fix any cracks. Remove anything that might make you trip as you walk through a door, such as a raised step or threshold. Trim any bushes or trees on the path to your home. Use bright outdoor lighting. Clear any walking paths of anything that might make someone trip, such as rocks or tools. Regularly check to see if handrails are loose or broken. Make sure that both sides of any steps have handrails. Any raised decks and porches should have guardrails on the edges. Have any leaves, snow, or ice cleared regularly. Use sand or salt on walking paths during winter. Clean up any spills in your garage right away. This includes oil or grease spills. What can I do in the bathroom? Use night lights. Install grab bars by the toilet and in the tub and shower. Do not use towel bars as grab bars. Use non-skid mats or decals in the tub or shower. If you need to sit down in the shower, use a plastic, non-slip stool. Keep the floor dry. Clean up any water that spills on the floor as soon as it happens. Remove soap buildup in the tub or shower regularly. Attach bath mats securely with double-sided non-slip rug tape. Do not have throw  rugs and other things on the floor that can make you trip. What can I do in the bedroom? Use night lights. Make sure that you have a light by your bed that is easy to reach. Do not use any sheets or blankets that are too big for your bed. They should not hang down onto the floor. Have a firm chair that has side arms. You can use this for support while you get dressed. Do not have throw rugs and other things on the floor that can make you trip. What can I do in the kitchen? Clean up any spills right away. Avoid walking on wet floors. Keep items that you use a lot in easy-to-reach places. If you need to reach something above you, use a strong step stool that has a grab bar. Keep electrical cords out of the way. Do not use floor polish or wax that makes floors slippery. If you must use wax, use non-skid floor wax. Do not have throw rugs and other things on the floor that can make you trip. What can I do with my stairs? Do not leave any items on the stairs. Make sure that there are handrails on both sides of the  stairs and use them. Fix handrails that are broken or loose. Make sure that handrails are as long as the stairways. Check any carpeting to make sure that it is firmly attached to the stairs. Fix any carpet that is loose or worn. Avoid having throw rugs at the top or bottom of the stairs. If you do have throw rugs, attach them to the floor with carpet tape. Make sure that you have a light switch at the top of the stairs and the bottom of the stairs. If you do not have them, ask someone to add them for you. What else can I do to help prevent falls? Wear shoes that: Do not have high heels. Have rubber bottoms. Are comfortable and fit you well. Are closed at the toe. Do not wear sandals. If you use a stepladder: Make sure that it is fully opened. Do not climb a closed stepladder. Make sure that both sides of the stepladder are locked into place. Ask someone to hold it for you, if  possible. Clearly mark and make sure that you can see: Any grab bars or handrails. First and last steps. Where the edge of each step is. Use tools that help you move around (mobility aids) if they are needed. These include: Canes. Walkers. Scooters. Crutches. Turn on the lights when you go into a dark area. Replace any light bulbs as soon as they burn out. Set up your furniture so you have a clear path. Avoid moving your furniture around. If any of your floors are uneven, fix them. If there are any pets around you, be aware of where they are. Review your medicines with your doctor. Some medicines can make you feel dizzy. This can increase your chance of falling. Ask your doctor what other things that you can do to help prevent falls. This information is not intended to replace advice given to you by your health care provider. Make sure you discuss any questions you have with your health care provider. Document Released: 09/03/2009 Document Revised: 04/14/2016 Document Reviewed: 12/12/2014 Elsevier Interactive Patient Education  2017 [employer].

## 2023-09-14 NOTE — Progress Notes (Signed)
Subjective:   Samantha Miranda is a 77 y.o. female who presents for Medicare Annual (Subsequent) preventive examination.  Visit Complete: Virtual I connected with  Samantha Miranda on 09/14/23 by a audio enabled telemedicine application and verified that I am speaking with the correct person using two identifiers.  Patient Location: Home  Provider Location: Office/Clinic  I discussed the limitations of evaluation and management by telemedicine. The patient expressed understanding and agreed to proceed.  Vital Signs: Because this visit was a virtual/telehealth visit, some criteria may be missing or patient reported. Any vitals not documented were not able to be obtained and vitals that have been documented are patient reported.  Patient Medicare AWV questionnaire was completed by the patient on 09/11/23; I have confirmed that all information answered by patient is correct and no changes since this date.  Cardiac Risk Factors include: advanced age (>56men, >45 women);dyslipidemia     Objective:    There were no vitals filed for this visit. There is no height or weight on file to calculate BMI.     09/14/2023    3:46 PM 08/02/2022    1:09 PM 08/12/2021    5:44 PM 07/02/2021    7:19 AM 06/29/2021   10:21 AM 06/23/2021    5:18 PM 05/25/2021   11:46 AM  Advanced Directives  Does Patient Have a Medical Advance Directive? Yes Yes No Yes Yes No No  Type of Estate agent of [place];Living will Healthcare Power of [place];Living will  Healthcare Power of [place];Living will Healthcare Power of [place];Living will    Copy of Healthcare Power of Attorney in Chart? No - copy requested No - copy requested  No - copy requested No - copy requested    Would patient like information on creating a medical advance directive?   No - Patient declined No - Patient declined  No - Patient declined Yes (MAU/Ambulatory/Procedural Areas - Information given)    Current Medications  (verified) Outpatient Encounter Medications as of 09/14/2023  Medication Sig   Biotin w/ Vitamins C & E (HAIR SKIN & NAILS GUMMIES PO) Take 2 each by mouth daily.   Calcium-Magnesium-Zinc (CAL-MAG-ZINC PO) Take 2 tablets by mouth daily.   cholecalciferol (VITAMIN D3) 25 MCG (1000 UNIT) tablet Take 1,000 Units by mouth daily.   ezetimibe (ZETIA) 10 MG tablet TAKE ONE TABLET BY MOUTH ONE TIME DAILY   fluticasone (FLONASE) 50 MCG/ACT nasal spray Place 2 sprays into both nostrils daily. (Patient taking differently: Place 2 sprays into both nostrils daily as needed for allergies.)   fluticasone-salmeterol (ADVAIR) 100-50 MCG/ACT AEPB INHALE ONE PUFF BY MOUTH TWICE A DAY   folic acid (FOLVITE) 1 MG tablet    gabapentin (NEURONTIN) 100 MG capsule Take 1 capsule (100 mg total) by mouth 3 (three) times daily.   hydrocortisone (ANUSOL-HC) 2.5 % rectal cream Place 1 Application rectally 2 (two) times daily as needed for hemorrhoids or anal itching.   hydroxychloroquine (PLAQUENIL) 200 MG tablet Take 1 tablet (200 mg total) by mouth daily.   loratadine (CLARITIN) 10 MG tablet Take 1 tablet (10 mg total) by mouth daily.   Omega-3 Fatty Acids (FISH OIL) 1200 MG CAPS Take 1,200 mg by mouth daily.   ondansetron (ZOFRAN ODT) 4 MG disintegrating tablet Take 1 tablet (4 mg total) by mouth every 8 (eight) hours as needed for nausea or vomiting.   ondansetron (ZOFRAN) 4 MG tablet TAKE ONE TABLET BY MOUTH EVERY 8 HOURS AS NEEDED FOR NAUSEA OR  VOMITING   pantoprazole (PROTONIX) 40 MG tablet Take 1 tablet (40 mg total) by mouth daily before breakfast.   predniSONE (DELTASONE) 5 MG tablet Take 5 mg by mouth daily with breakfast. As needed fro RA flares   promethazine (PHENERGAN) 25 MG tablet Take 1 tablet (25 mg total) by mouth every 8 (eight) hours as needed for nausea or vomiting. (Patient not taking: Reported on 03/27/2023)   promethazine-dextromethorphan (PROMETHAZINE-DM) 6.25-15 MG/5ML syrup Take 5 mLs by mouth 4  (four) times daily as needed.   vitamin C (ASCORBIC ACID) 250 MG tablet Take 250 mg by mouth daily.   XIIDRA 5 % SOLN Place 1 drop into both eyes in the morning and at bedtime.   zolpidem (AMBIEN) 5 MG tablet TAKE ONE TABLET BY MOUTH AT BEDTIME AS NEEDED FOR SLEEP   No facility-administered encounter medications on file as of 09/14/2023.    Allergies (verified) Statins, Amoxicillin-pot clavulanate, and Moxifloxacin   History: Past Medical History:  Diagnosis Date   Barrett's esophagus    Basal cell carcinoma of left lower leg    Breast cancer (HCC)    right   COPD (chronic obstructive pulmonary disease) (HCC) 2018   Depression 1989   Gastric ulcer    GERD (gastroesophageal reflux disease)    History of colon polyps    Hyperlipidemia    Osteopenia    Past Surgical History:  Procedure Laterality Date   ABDOMINAL HYSTERECTOMY  11/21/1989   APPENDECTOMY  1969   BREAST LUMPECTOMY  07/22/2006   BREAST LUMPECTOMY WITH RADIOACTIVE SEED AND SENTINEL LYMPH NODE BIOPSY Right 07/02/2021   Procedure: RIGHT BREAST LUMPECTOMY WITH RADIOACTIVE SEED AND RIGHT AXILLARY SENTINEL LYMPH NODE BIOPSY;  Surgeon: Emelia Loron, MD;  Location: [facility] OR;  Service: General;  Laterality: Right;   CATARACT EXTRACTION, BILATERAL     EYE SURGERY  08/2013  10/2013   cataracts   TONSILLECTOMY     Family History  Problem Relation Age of Onset   Heart failure Mother        CHF   Hyperlipidemia Mother    Aortic stenosis Mother    Arthritis Mother    Heart disease Mother    Lung cancer Father 74   Cancer Father    Early death Father    Breast cancer Sister 9   Cancer Sister 10   Depression Sister    Stroke Maternal Aunt    Varicose Veins Maternal Aunt    Diabetes Maternal Grandmother    Hyperlipidemia Maternal Grandmother    Colon cancer Maternal Grandfather    Cancer Maternal Grandfather        colon cancer   Cancer Paternal Grandfather        unknown type cancer   Drug abuse Daughter     Heart attack Neg Hx    Hypertension Neg Hx    Sudden death Neg Hx    Esophageal cancer Neg Hx    Stomach cancer Neg Hx    Rectal cancer Neg Hx    Social History   Socioeconomic History   Marital status: Married    Spouse name: Not on file   Number of children: 1   Years of education: Not on file   Highest education level: 12th grade  Occupational History   Occupation: retired    Associate Professor: [employer]    Comment: retired  Tobacco Use   Smoking status: Former    Current packs/day: 0.00    Average packs/day: 0.5 packs/day for 40.0  years (20.0 ttl pk-yrs)    Types: Cigarettes, E-cigarettes    Start date: 07/23/1971    Quit date: 07/23/2011    Years since quitting: 12.1   Smokeless tobacco: Never   Tobacco comments:    The last 5 yrs. I smoked .5 pack a day. Before that, 1 pk. a day  Vaping Use   Vaping status: Former   Substances: Nicotine  Substance and Sexual Activity   Alcohol use: No    Comment: occasional wine   Drug use: No   Sexual activity: Not Currently    Partners: Male    Birth control/protection: Post-menopausal  Other Topics Concern   Not on file  Social History Narrative   Exercise-virtual class, MWF   Social Determinants of Health   Financial Resource Strain: Low Risk  (09/11/2023)   Overall Financial Resource Strain (CARDIA)    Difficulty of Paying Living Expenses: Not hard at all  Food Insecurity: No Food Insecurity (09/11/2023)   Hunger Vital Sign    Worried About Running Out of Food in the Last Year: Never true    Ran Out of Food in the Last Year: Never true  Transportation Needs: No Transportation Needs (09/11/2023)   PRAPARE - Administrator, Civil Service (Medical): No    Lack of Transportation (Non-Medical): No  Physical Activity: Sufficiently Active (09/11/2023)   Exercise Vital Sign    Days of Exercise per Week: 3 days    Minutes of Exercise per Session: 50 min  Stress: No Stress Concern Present (09/11/2023)    [employer] of Occupational Health - Occupational Stress Questionnaire    Feeling of Stress : Only a little  Social Connections: Moderately Isolated (09/11/2023)   Social Connection and Isolation Panel [NHANES]    Frequency of Communication with Friends and Family: Twice a week    Frequency of Social Gatherings with Friends and Family: Once a week    Attends Religious Services: Never    Database administrator or Organizations: No    Attends Banker Meetings: Never    Marital Status: Married    Tobacco Counseling Counseling given: Not Answered Tobacco comments: The last 5 yrs. I smoked .5 pack a day. Before that, 1 pk. a day   Clinical Intake:  Pre-visit preparation completed: Yes  Pain : No/denies pain  Nutritional Risks: None Diabetes: No  How often do you need to have someone help you when you read instructions, pamphlets, or other written materials from your doctor or pharmacy?: 1 - Never  Interpreter Needed?: No  Information entered by :: Donne Anon, CMA   Activities of Daily Living    09/11/2023    9:58 AM  In your present state of health, do you have any difficulty performing the following activities:  Hearing? 0  Vision? 0  Difficulty concentrating or making decisions? 0  Walking or climbing stairs? 0  Dressing or bathing? 0  Doing errands, shopping? 0  Preparing Food and eating ? N  Using the Toilet? N  In the past six months, have you accidently leaked urine? N  Do you have problems with loss of bowel control? N  Managing your Medications? N  Managing your Finances? N  Housekeeping or managing your Housekeeping? N    Patient Care Team: Zola Button, Grayling Congress, DO as PCP - General [place] Seamen Dustin Folks, MD as Referring Physician (Optometry) Ellen Henri (Dermatology) Emelia Loron, MD as Consulting Physician (General Surgery) Malachy Mood, MD as Consulting Physician (Hematology)  Casimer Lanius, MD as Consulting Physician  (Rheumatology)  Indicate any recent Medical Services you may have received from other than [facility] providers in the past year (date may be approximate).     Assessment:   This is a routine wellness examination for [place].  Hearing/Vision screen No results found.   Goals Addressed   None    Depression Screen    09/14/2023    3:49 PM 08/02/2022    1:07 PM 08/02/2022    1:06 PM 11/23/2021    1:33 PM 05/25/2021   11:49 AM 01/18/2021   11:16 AM 07/20/2020    2:35 PM  PHQ 2/9 Scores  PHQ - 2 Score 0 0 1 0 0 0 0    Fall Risk    09/11/2023    9:58 AM 08/02/2022    1:10 PM 07/26/2022   11:07 AM 11/23/2021    1:33 PM 05/25/2021   11:48 AM  Fall Risk   Falls in the past year? 0 0 0 0 0  Number falls in past yr: 0 0  0 0  Injury with Fall? 0 0  0 0  Risk for fall due to : No Fall Risks Impaired vision  No Fall Risks   Follow up Falls evaluation completed Falls prevention discussed  Falls evaluation completed Falls prevention discussed    MEDICARE RISK AT HOME: Medicare Risk at Home Any stairs in or around the home?: Yes If so, are there any without handrails?: No Home free of loose throw rugs in walkways, pet beds, electrical cords, etc?: Yes Adequate lighting in your home to reduce risk of falls?: Yes Life alert?: No Use of a cane, walker or w/c?: No Grab bars in the bathroom?: Yes Shower chair or bench in shower?: Yes Elevated toilet seat or a handicapped toilet?: No  TIMED UP AND GO:  Was the test performed?  No    Cognitive Function:    12/19/2017   11:30 AM  MMSE - Mini Mental State Exam  Orientation to time 5  Orientation to Place 5  Registration 3  Attention/ Calculation 5  Recall 3  Language- name 2 objects 2  Language- repeat 1  Language- follow 3 step command 3  Language- read & follow direction 1  Write a sentence 1  Copy design 1  Total score 30        09/14/2023    3:51 PM 08/02/2022    1:11 PM  6CIT Screen  What Year? 0 points 0 points  What month? 0  points 0 points  What time? 0 points 0 points  Count back from 20 0 points 0 points  Months in reverse 0 points 0 points  Repeat phrase 0 points 0 points  Total Score 0 points 0 points    Immunizations Immunization History  Administered Date(s) Administered   Fluad Quad(high Dose 65+) 10/29/2019, 07/22/2021, 08/02/2022   Influenza Split 09/22/2011   Influenza, High Dose Seasonal PF 10/10/2014, 11/02/2015, 08/26/2017, 09/17/2018   Influenza-Unspecified 08/26/2017, 09/17/2018, 09/16/2020   PFIZER(Purple Top)SARS-COV-2 Vaccination 01/25/2020, 02/15/2020, 09/16/2020   Pfizer Covid-19 Vaccine Bivalent Booster 23yrs & up 08/23/2021   Pneumococcal Conjugate-13 04/21/2015   Pneumococcal Polysaccharide-23 03/03/2014   Td 08/26/2010, 05/05/2021   Zoster Recombinant(Shingrix) 06/11/2017, 08/26/2017   Zoster, Live 04/02/2009    TDAP status: Up to date  Flu Vaccine status: Due, Education has been provided regarding the importance of this vaccine. Advised may receive this vaccine at local pharmacy or Health Dept. Aware to provide a copy  of the vaccination record if obtained from local pharmacy or Health Dept. Verbalized acceptance and understanding.  Pneumococcal vaccine status: Up to date  Covid-19 vaccine status: Information provided on how to obtain vaccines.   Qualifies for Shingles Vaccine? Yes   Zostavax completed Yes   Shingrix Completed?: Yes  Screening Tests Health Maintenance  Topic Date Due   COVID-19 Vaccine (5 - 2023-24 season) 07/23/2023   Medicare Annual Wellness (AWV)  08/03/2023   INFLUENZA VACCINE  02/19/2024 (Originally 06/22/2023)   Lung Cancer Screening  11/02/2023   MAMMOGRAM  07/10/2024   DTaP/Tdap/Td (3 - Tdap) 05/06/2031   Pneumonia Vaccine 35+ Years old  Completed   DEXA SCAN  Completed   Hepatitis C Screening  Completed   Zoster Vaccines- Shingrix  Completed   HPV VACCINES  Aged Out   Colonoscopy  Discontinued    Health Maintenance  Health Maintenance  Due  Topic Date Due   COVID-19 Vaccine (5 - 2023-24 season) 07/23/2023   Medicare Annual Wellness (AWV)  08/03/2023    Colorectal cancer screening: No longer required.   Mammogram status: Completed 07/11/23. Repeat every year  Bone Density status: Completed 09/21/21. Results reflect: Bone density results: OSTEOPOROSIS. Repeat every 2 years.  Lung Cancer Screening: (Low Dose CT Chest recommended if Age 97-80 years, 20 pack-year currently smoking OR have quit w/in 15years.) does not qualify.   Additional Screening:  Hepatitis C Screening: does qualify; Completed 11/02/15  Vision Screening: Recommended annual ophthalmology exams for early detection of glaucoma and other disorders of the eye. Is the patient up to date with their annual eye exam?  Yes  Who is the provider or what is the name of the office in which the patient attends annual eye exams? Dr. Nedra Hai [facility] If pt is not established with a provider, would they like to be referred to a provider to establish care? No .   Dental Screening: Recommended annual dental exams for proper oral hygiene  Diabetic Foot Exam: N/a  Community Resource Referral / Chronic Care Management: CRR required this visit?  No   CCM required this visit?  No     Plan:     I have personally reviewed and noted the following in the patient's chart:   Medical and social history Use of alcohol, tobacco or illicit drugs  Current medications and supplements including opioid prescriptions. Patient is not currently taking opioid prescriptions. Functional ability and status Nutritional status Physical activity Advanced directives List of other physicians Hospitalizations, surgeries, and ER visits in previous 12 months Vitals Screenings to include cognitive, depression, and falls Referrals and appointments  In addition, I have reviewed and discussed with patient certain preventive protocols, quality metrics, and best practice recommendations. A  written personalized care plan for preventive services as well as general preventive health recommendations were provided to patient.     Donne Anon, CMA   09/14/2023   After Visit Summary: (MyChart) Due to this being a telephonic visit, the after visit summary with patients personalized plan was offered to patient via MyChart   Nurse Notes: None

## 2023-09-22 ENCOUNTER — Other Ambulatory Visit: Payer: Self-pay | Admitting: Family Medicine

## 2023-09-22 DIAGNOSIS — E785 Hyperlipidemia, unspecified: Secondary | ICD-10-CM

## 2023-09-28 ENCOUNTER — Encounter: Payer: Self-pay | Admitting: Adult Health

## 2023-09-28 ENCOUNTER — Inpatient Hospital Stay: Payer: Medicare PPO | Admitting: Adult Health

## 2023-09-28 ENCOUNTER — Telehealth: Payer: Self-pay

## 2023-09-28 ENCOUNTER — Inpatient Hospital Stay: Payer: Medicare PPO | Attending: Adult Health

## 2023-09-28 VITALS — BP 154/60 | HR 76 | Temp 97.9°F | Resp 18 | Ht 62.0 in | Wt 123.7 lb

## 2023-09-28 DIAGNOSIS — Z170 Estrogen receptor positive status [ER+]: Secondary | ICD-10-CM | POA: Insufficient documentation

## 2023-09-28 DIAGNOSIS — C50111 Malignant neoplasm of central portion of right female breast: Secondary | ICD-10-CM | POA: Insufficient documentation

## 2023-09-28 DIAGNOSIS — L299 Pruritus, unspecified: Secondary | ICD-10-CM | POA: Insufficient documentation

## 2023-09-28 DIAGNOSIS — Z171 Estrogen receptor negative status [ER-]: Secondary | ICD-10-CM

## 2023-09-28 LAB — CBC WITH DIFFERENTIAL (CANCER CENTER ONLY)
Abs Immature Granulocytes: 0.01 10*3/uL (ref 0.00–0.07)
Basophils Absolute: 0.1 10*3/uL (ref 0.0–0.1)
Basophils Relative: 1 %
Eosinophils Absolute: 0.1 10*3/uL (ref 0.0–0.5)
Eosinophils Relative: 3 %
HCT: 37.7 % (ref 36.0–46.0)
Hemoglobin: 13 g/dL (ref 12.0–15.0)
Immature Granulocytes: 0 %
Lymphocytes Relative: 29 %
Lymphs Abs: 1.4 10*3/uL (ref 0.7–4.0)
MCH: 31.3 pg (ref 26.0–34.0)
MCHC: 34.5 g/dL (ref 30.0–36.0)
MCV: 90.6 fL (ref 80.0–100.0)
Monocytes Absolute: 0.4 10*3/uL (ref 0.1–1.0)
Monocytes Relative: 9 %
Neutro Abs: 2.9 10*3/uL (ref 1.7–7.7)
Neutrophils Relative %: 58 %
Platelet Count: 171 10*3/uL (ref 150–400)
RBC: 4.16 MIL/uL (ref 3.87–5.11)
RDW: 12.9 % (ref 11.5–15.5)
WBC Count: 4.9 10*3/uL (ref 4.0–10.5)
nRBC: 0 % (ref 0.0–0.2)

## 2023-09-28 LAB — CMP (CANCER CENTER ONLY)
ALT: 12 U/L (ref 0–44)
AST: 22 U/L (ref 15–41)
Albumin: 4 g/dL (ref 3.5–5.0)
Alkaline Phosphatase: 49 U/L (ref 38–126)
Anion gap: 6 (ref 5–15)
BUN: 17 mg/dL (ref 8–23)
CO2: 29 mmol/L (ref 22–32)
Calcium: 9.5 mg/dL (ref 8.9–10.3)
Chloride: 104 mmol/L (ref 98–111)
Creatinine: 1.05 mg/dL — ABNORMAL HIGH (ref 0.44–1.00)
GFR, Estimated: 55 mL/min — ABNORMAL LOW (ref >60)
Glucose, Bld: 82 mg/dL (ref 70–99)
Potassium: 4 mmol/L (ref 3.5–5.1)
Sodium: 139 mmol/L (ref 135–145)
Total Bilirubin: 0.4 mg/dL (ref <1.2)
Total Protein: 7 g/dL (ref 6.5–8.1)

## 2023-09-28 MED ORDER — TRIAMCINOLONE ACETONIDE 0.025 % EX OINT: 1.0000 | TOPICAL_OINTMENT | Freq: Two times a day (BID) | CUTANEOUS | 0 refills | Status: DC

## 2023-09-28 NOTE — Progress Notes (Signed)
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 [facility]) Staging form: Breast, AJCC 8th Edition - Clinical stage from 06/21/2021: Stage IA (cT1b, cN0, cM0, G1, ER+, PR+, HER2-) - Signed by Malachy Mood, MD on 06/23/2021 Stage prefix: Initial diagnosis Histologic grading system: 3 grade system - Pathologic stage from 07/02/2021: Stage IA (pT1c, pN0, cM0, G1, ER+, PR+, HER2-, Oncotype DX score: 28) - Signed by Malachy Mood, MD on 07/21/2021 Stage prefix: Initial diagnosis Multigene prognostic tests performed: Oncotype DX Recurrence score range: Greater than or equal to 11 Histologic grading system: 3 grade system   SUMMARY OF ONCOLOGIC HISTORY: Oncology History Overview Note  Cancer Staging 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 [facility]) Staging form: Breast, AJCC 8th Edition - Clinical stage from 06/21/2021: Stage IA (cT1b, cN0, cM0, G1, ER+, PR+, HER2-) - Signed by Malachy Mood, MD on 06/23/2021 Stage prefix: Initial diagnosis Histologic grading system: 3 grade system - Pathologic stage from 07/02/2021: Stage IA (pT1c, pN0, cM0, G1, ER+, PR+, HER2-, Oncotype DX score: 28) - Signed by Malachy Mood, MD on 07/21/2021 Stage prefix: Initial diagnosis Multigene prognostic tests performed: Oncotype DX Recurrence score range: Greater than or equal to 11 Histologic grading system: 3 grade system     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   06/17/2021 Pathology Results   Diagnosis Breast, right, needle core biopsy, 3:00, 4 cmfn  - INVASIVE MAMMARY CARCINOMA, SEE COMMENT  - MAMMARY CARCINOMA IN SITU. The carcinoma appears grade 1. E-cadherin is positive in the  invasive and in situ carcinoma consistent with a ductal phenotype.  PROGNOSTIC INDICATORS Results: IMMUNOHISTOCHEMICAL AND MORPHOMETRIC ANALYSIS PERFORMED MANUALLY The tumor cells are EQUIVOCAL for HER2 (2+). Her2 by FISH will be performed and results reported separately. 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 BREAST, RIGHT, LUMPECTOMY:  - Invasive and in situ ductal carcinoma, 1.2 cm.  - Carcinoma is 0.7 cm from superior margin.  - Biopsy site and biopsy clip.  - See oncology table.   B. BREAST, RIGHT ADDITIONAL INFERIOR MARGIN, EXCISION:  - Fibrocystic changes.  - Final inferior margin negative for carcinoma.   C. LYMPH NODE, RIGHT AXILLARY, SENTINEL, EXCISION:  - One lymph node with capsular nevus and negative for metastatic  carcinoma (0/1).   D. LYMPH NODE, RIGHT AXILLARY, SENTINEL, EXCISION:  - One lymph node negative for metastatic carcinoma (0/1).   E. LYMPH NODE, RIGHT AXILLARY, SENTINEL, EXCISION:  - One lymph node negative for metastatic carcinoma (0/1).   F. LYMPH NODE, RIGHT AXILLARY, SENTINEL, EXCISION:  - One lymph node negative for metastatic carcinoma (0/1).  G. LYMPH NODE, RIGHT AXILLARY, SENTINEL, EXCISION:  - One lymph node negative for metastatic carcinoma (0/1).   H. BREAST, RIGHT ADDITIONAL LATERAL MARGIN, EXCISION:  - Fibrocystic changes.  - Final lateral margin negative for carcinoma.   I. BREAST,  RIGHT ADDITIONAL POSTERIOR MARGIN, EXCISION:  - Benign breast tissue.  - Final posterior margin negative for carcinoma.    07/02/2021 Cancer Staging   Staging form: Breast, AJCC 8th Edition - Pathologic stage from 07/02/2021: Stage IA (pT1c, pN0, cM0, G1, ER+, PR+, HER2-, Oncotype DX score: 28) - Signed by Malachy Mood, MD on 07/21/2021 Stage prefix: Initial diagnosis Multigene prognostic tests performed: Oncotype DX Recurrence score range: Greater than or equal to 11 Histologic grading system: 3 grade system   03/2022 -  Anti-estrogen oral therapy   Unable to tolerate any of the aromatase inhibitors or low-dose tamoxifen.     CURRENT THERAPY: observation  INTERVAL HISTORY: Samantha Miranda 77 y.o. female returns for follow-up of her history of breast cancer.  She also has rheumatoid arthritis (RA). She presents with a new onset of intense itching and intermittent pain under the right nipple for the past two weeks. The patient describes the sensation as if it is originating from inside the nipple. There is no observed redness or swelling. The patient's most recent mammogram, conducted on August 20th, was negative for malignancy and showed breast density B. The patient denies any recent changes in lotions, creams, or bras that could potentially cause an allergic reaction.  The patient also reports taking Tylenol for arthritis pain related to RA, which she believes may be contributing to a slight increase in her kidney function. The patient is also on Plaquenil for RA management. The patient admits to a low water intake and is advised to increase hydration.  The patient is actively participating in a thrice-weekly video exercise class, [employer], with her spouse. The class includes cardio, strength, balance, and stretching exercises, which the patient reports as beneficial for her RA symptoms.   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 (HCC) 06/23/2021   Depression, major, single episode, 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   Influenza vaccine administered 11/02/2015   Otitis media 08/10/2015   Acute bronchitis 04/21/2015   Right ankle pain 02/10/2014   Left shoulder pain 11/22/2013   Right shoulder pain 08/20/2013   Near syncope 08/12/2013   Other malaise and fatigue 08/12/2013   Rectal itching 07/01/2013   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  MEDICAL HISTORY: Past Medical History:  Diagnosis Date   Barrett's esophagus    Basal cell carcinoma of left lower leg  Breast cancer (HCC)    right   COPD (chronic obstructive pulmonary disease) (HCC) 2018   Depression 1989   Gastric ulcer    GERD (gastroesophageal reflux disease)    History of colon polyps    Hyperlipidemia    Osteopenia     SURGICAL HISTORY: Past Surgical History:  Procedure Laterality Date   ABDOMINAL HYSTERECTOMY  11/21/1989   APPENDECTOMY  1969   BREAST LUMPECTOMY  07/22/2006   BREAST LUMPECTOMY WITH RADIOACTIVE SEED AND SENTINEL LYMPH NODE BIOPSY Right  07/02/2021   Procedure: RIGHT BREAST LUMPECTOMY WITH RADIOACTIVE SEED AND RIGHT AXILLARY SENTINEL LYMPH NODE BIOPSY;  Surgeon: Emelia Loron, MD;  Location: [facility] OR;  Service: General;  Laterality: Right;   CATARACT EXTRACTION, BILATERAL     EYE SURGERY  08/2013  10/2013   cataracts   TONSILLECTOMY      SOCIAL HISTORY: Social History   Socioeconomic History   Marital status: Married    Spouse name: Not on file   Number of children: 1   Years of education: Not on file   Highest education level: 12th grade  Occupational History   Occupation: retired    Associate Professor: [employer]    Comment: retired  Tobacco Use   Smoking status: Former    Current packs/day: 0.00    Average packs/day: 0.5 packs/day for 40.0 years (20.0 ttl pk-yrs)    Types: Cigarettes, E-cigarettes    Start date: 07/23/1971    Quit date: 07/23/2011    Years since quitting: 12.1   Smokeless tobacco: Never   Tobacco comments:    The last 5 yrs. I smoked .5 pack a day. Before that, 1 pk. a day  Vaping Use   Vaping status: Former   Substances: Nicotine  Substance and Sexual Activity   Alcohol use: No    Comment: occasional wine   Drug use: No   Sexual activity: Not Currently    Partners: Male    Birth control/protection: Post-menopausal  Other Topics Concern   Not on file  Social History Narrative   Exercise-virtual class, MWF   Social Determinants of Health   Financial Resource Strain: Low Risk  (09/11/2023)   Overall Financial Resource Strain (CARDIA)    Difficulty of Paying Living Expenses: Not hard at all  Food Insecurity: No Food Insecurity (09/11/2023)   Hunger Vital Sign    Worried About Running Out of Food in the Last Year: Never true    Ran Out of Food in the Last Year: Never true  Transportation Needs: No Transportation Needs (09/11/2023)   PRAPARE - Administrator, Civil Service (Medical): No    Lack of Transportation (Non-Medical): No  Physical Activity: Sufficiently  Active (09/11/2023)   Exercise Vital Sign    Days of Exercise per Week: 3 days    Minutes of Exercise per Session: 50 min  Stress: No Stress Concern Present (09/11/2023)   [employer] of Occupational Health - Occupational Stress Questionnaire    Feeling of Stress : Only a little  Social Connections: Moderately Isolated (09/11/2023)   Social Connection and Isolation Panel [NHANES]    Frequency of Communication with Friends and Family: Twice a week    Frequency of Social Gatherings with Friends and Family: Once a week    Attends Religious Services: Never    Database administrator or Organizations: No    Attends Banker Meetings: Never    Marital Status: Married  Catering manager Violence: Not At Risk (  09/14/2023)   Humiliation, Afraid, Rape, and Kick questionnaire    Fear of Current or Ex-Partner: No    Emotionally Abused: No    Physically Abused: No    Sexually Abused: No    FAMILY HISTORY: Family History  Problem Relation Age of Onset   Heart failure Mother        CHF   Hyperlipidemia Mother    Aortic stenosis Mother    Arthritis Mother    Heart disease Mother    Lung cancer Father 56   Cancer Father    Early death Father    Breast cancer Sister 65   Cancer Sister 91   Depression Sister    Stroke Maternal Aunt    Varicose Veins Maternal Aunt    Diabetes Maternal Grandmother    Hyperlipidemia Maternal Grandmother    Colon cancer Maternal Grandfather    Cancer Maternal Grandfather        colon cancer   Cancer Paternal Grandfather        unknown type cancer   Drug abuse Daughter    Heart attack Neg Hx    Hypertension Neg Hx    Sudden death Neg Hx    Esophageal cancer Neg Hx    Stomach cancer Neg Hx    Rectal cancer Neg Hx     Review of Systems  Constitutional:  Negative for appetite change, chills, fatigue, fever and unexpected weight change.  HENT:   Negative for hearing loss, lump/mass and trouble swallowing.   Eyes:  Negative for eye  problems and icterus.  Respiratory:  Negative for chest tightness, cough and shortness of breath.   Cardiovascular:  Negative for chest pain, leg swelling and palpitations.  Gastrointestinal:  Negative for abdominal distention, abdominal pain, constipation, diarrhea, nausea and vomiting.  Endocrine: Negative for hot flashes.  Genitourinary:  Negative for difficulty urinating.   Musculoskeletal:  Positive for arthralgias.  Skin:  Positive for itching. Negative for rash.  Neurological:  Negative for dizziness, extremity weakness, headaches and numbness.  Hematological:  Negative for adenopathy. Does not bruise/bleed easily.  Psychiatric/Behavioral:  Negative for depression. The patient is not nervous/anxious.       PHYSICAL EXAMINATION    Vitals:   09/28/23 1414  BP: (!) 154/60  Pulse: 76  Resp: 18  Temp: 97.9 F (36.6 C)  SpO2: 99%    Physical Exam Constitutional:      General: She is not in acute distress.    Appearance: Normal appearance. She is not toxic-appearing.  HENT:     Head: Normocephalic and atraumatic.     Mouth/Throat:     Mouth: Mucous membranes are moist.     Pharynx: Oropharynx is clear. No oropharyngeal exudate or posterior oropharyngeal erythema.  Eyes:     General: No scleral icterus. Cardiovascular:     Rate and Rhythm: Normal rate and regular rhythm.     Pulses: Normal pulses.     Heart sounds: Normal heart sounds.  Pulmonary:     Effort: Pulmonary effort is normal.     Breath sounds: Normal breath sounds.  Chest:     Comments: Right breast status postlumpectomy, no sign of local recurrence + subareolar focal tenderness, left breast is benign Abdominal:     General: Abdomen is flat. Bowel sounds are normal. There is no distension.     Palpations: Abdomen is soft.     Tenderness: There is no abdominal tenderness.  Musculoskeletal:        General: No swelling.  Cervical back: Neck supple.  Lymphadenopathy:     Cervical: No cervical  adenopathy.     Upper Body:     Right upper body: No axillary adenopathy.     Left upper body: No axillary adenopathy.  Skin:    General: Skin is warm and dry.     Findings: No rash.  Neurological:     General: No focal deficit present.     Mental Status: She is alert.  Psychiatric:        Mood and Affect: Mood normal.        Behavior: Behavior normal.     LABORATORY DATA:  CBC    Component Value Date/Time   WBC 4.9 09/28/2023 1235   WBC 5.9 01/31/2023 1440   RBC 4.16 09/28/2023 1235   HGB 13.0 09/28/2023 1235   HGB 12.8 10/03/2006 1235   HCT 37.7 09/28/2023 1235   HCT 37.1 10/03/2006 1235   PLT 171 09/28/2023 1235   PLT 235 10/03/2006 1235   MCV 90.6 09/28/2023 1235   MCV 92.9 10/03/2006 1235   MCH 31.3 09/28/2023 1235   MCHC 34.5 09/28/2023 1235   RDW 12.9 09/28/2023 1235   RDW 13.2 10/03/2006 1235   LYMPHSABS 1.4 09/28/2023 1235   LYMPHSABS 2.3 10/03/2006 1235   MONOABS 0.4 09/28/2023 1235   MONOABS 0.4 10/03/2006 1235   EOSABS 0.1 09/28/2023 1235   EOSABS 0.3 10/03/2006 1235   BASOSABS 0.1 09/28/2023 1235   BASOSABS 0.1 10/03/2006 1235    CMP     Component Value Date/Time   NA 139 09/28/2023 1235   K 4.0 09/28/2023 1235   CL 104 09/28/2023 1235   CO2 29 09/28/2023 1235   GLUCOSE 82 09/28/2023 1235   BUN 17 09/28/2023 1235   CREATININE 1.05 (H) 09/28/2023 1235   CREATININE 1.00 12/14/2021 1527   CALCIUM 9.5 09/28/2023 1235   PROT 7.0 09/28/2023 1235   ALBUMIN 4.0 09/28/2023 1235   AST 22 09/28/2023 1235   ALT 12 09/28/2023 1235   ALKPHOS 49 09/28/2023 1235   BILITOT 0.4 09/28/2023 1235   GFRNONAA 55 (L) 09/28/2023 1235   GFRAA >60 03/19/2016 1028      ASSESSMENT and THERAPY PLAN:   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  History of breast cancer Will obtain repeat right breast mammogram as noted below.  Continue with annual mammograms, healthy diet and exercise.  Breast Itching New onset itching and focal tenderness under the right nipple for two weeks. No redness or swelling, no palpable abnormality on exam. Her most recent mammogram on August 20th was negative for malignancy. -Order a mammogram and ultrasound to rule out any underlying pathology causing the itching. -Prescribe topical ointment for symptomatic relief.  Mildly Elevated Kidney Function There is a slight increase in kidney function. The patient reports low water intake. -Increase water intake. -Notify Dr. Aryl of the lab results   Rheumatoid Arthritis Managed with Plaquenil for about a year per patient report -Continue Plaquenil and f/u with Dr. Deanne Coffer her rheumatologist.  General Health Maintenance Regular exercise regimen with [facility] three times a week. -Continue regular exercise regimen.  Follow-up Schedule labs and follow-up appointment in six months unless patient needs to be seen sooner.   All questions were  answered. The patient knows to call the clinic with any problems, questions or concerns. We can certainly see the patient much sooner if necessary.  Total encounter time:20 minutes*in face-to-face visit time, chart review, lab review, care coordination, order entry, and documentation of the encounter time.  Lillard Anes, NP 09/28/23 2:51 PM Medical Oncology and Hematology [facility] [address] [place], [place] [address] Tel. [phone]    Fax. [phone]  *Total Encounter Time as defined by the Centers for Medicare and Medicaid Services includes, in addition to the face-to-face time of a patient visit (documented in the note above) non-face-to-face time: obtaining and reviewing outside history, ordering and reviewing medications, tests or procedures, care  coordination (communications with other health care professionals or caregivers) and documentation in the medical record.

## 2023-09-28 NOTE — Telephone Encounter (Signed)
Orders for dx MM and [place] faxed to [facility] MM per NP [phone]. Fax confirmation received,.

## 2023-09-28 NOTE — Assessment & Plan Note (Signed)
Pansey is a 77 year old woman with stage IA ER/PR positive breast cancer diagnosed in August 2022 status post lumpectomy, Oncotype 28, declined chemotherapy, not a candidate for radiation, and unable to tolerate antiestrogen therapy.  History of breast cancer Will obtain repeat right breast mammogram as noted below.  Continue with annual mammograms, healthy diet and exercise.  Breast Itching New onset itching and focal tenderness under the right nipple for two weeks. No redness or swelling, no palpable abnormality on exam. Her most recent mammogram on August 20th was negative for malignancy. -Order a mammogram and ultrasound to rule out any underlying pathology causing the itching. -Prescribe topical ointment for symptomatic relief.  Mildly Elevated Kidney Function There is a slight increase in kidney function. The patient reports low water intake. -Increase water intake. -Notify Dr. Aryl of the lab results   Rheumatoid Arthritis Managed with Plaquenil for about a year per patient report -Continue Plaquenil and f/u with Dr. Deanne Coffer her rheumatologist.  General Health Maintenance Regular exercise regimen with a [facility] three times a week. -Continue regular exercise regimen.  Follow-up Schedule labs and follow-up appointment in six months unless patient needs to be seen sooner.

## 2023-09-29 ENCOUNTER — Other Ambulatory Visit: Payer: Self-pay | Admitting: Nurse Practitioner

## 2023-10-03 DIAGNOSIS — K649 Unspecified hemorrhoids: Secondary | ICD-10-CM | POA: Diagnosis not present

## 2023-10-03 DIAGNOSIS — G4700 Insomnia, unspecified: Secondary | ICD-10-CM | POA: Diagnosis not present

## 2023-10-03 DIAGNOSIS — J302 Other seasonal allergic rhinitis: Secondary | ICD-10-CM | POA: Diagnosis not present

## 2023-10-03 DIAGNOSIS — K219 Gastro-esophageal reflux disease without esophagitis: Secondary | ICD-10-CM | POA: Diagnosis not present

## 2023-10-03 DIAGNOSIS — M792 Neuralgia and neuritis, unspecified: Secondary | ICD-10-CM | POA: Diagnosis not present

## 2023-10-03 DIAGNOSIS — M1990 Unspecified osteoarthritis, unspecified site: Secondary | ICD-10-CM | POA: Diagnosis not present

## 2023-10-03 DIAGNOSIS — M810 Age-related osteoporosis without current pathological fracture: Secondary | ICD-10-CM | POA: Diagnosis not present

## 2023-10-03 DIAGNOSIS — I2510 Atherosclerotic heart disease of native coronary artery without angina pectoris: Secondary | ICD-10-CM | POA: Diagnosis not present

## 2023-10-03 DIAGNOSIS — E785 Hyperlipidemia, unspecified: Secondary | ICD-10-CM | POA: Diagnosis not present

## 2023-10-17 ENCOUNTER — Telehealth: Payer: Self-pay

## 2023-10-17 DIAGNOSIS — C50111 Malignant neoplasm of central portion of right female breast: Secondary | ICD-10-CM | POA: Diagnosis not present

## 2023-10-17 DIAGNOSIS — L299 Pruritus, unspecified: Secondary | ICD-10-CM | POA: Diagnosis not present

## 2023-10-17 DIAGNOSIS — N6489 Other specified disorders of breast: Secondary | ICD-10-CM | POA: Diagnosis not present

## 2023-10-17 DIAGNOSIS — R599 Enlarged lymph nodes, unspecified: Secondary | ICD-10-CM | POA: Diagnosis not present

## 2023-10-17 NOTE — Telephone Encounter (Signed)
Called pt to inform her about biopsy 10/23/23 at 1:00. Pt verbalized understanding.

## 2023-10-18 ENCOUNTER — Encounter: Payer: Self-pay | Admitting: Adult Health

## 2023-10-18 ENCOUNTER — Ambulatory Visit
Admission: RE | Admit: 2023-10-18 | Discharge: 2023-10-18 | Disposition: A | Discharge status: Home / Self Care | Payer: Medicare PPO | Source: Ambulatory Visit | Attending: Internal Medicine | Admitting: Internal Medicine

## 2023-10-18 VITALS — BP 160/79 | HR 84 | Temp 97.4°F | Resp 16

## 2023-10-18 DIAGNOSIS — G44209 Tension-type headache, unspecified, not intractable: Secondary | ICD-10-CM | POA: Diagnosis not present

## 2023-10-18 DIAGNOSIS — R030 Elevated blood-pressure reading, without diagnosis of hypertension: Secondary | ICD-10-CM

## 2023-10-18 MED ORDER — METHOCARBAMOL 500 MG PO TABS: 500.0000 mg | ORAL_TABLET | Freq: Two times a day (BID) | ORAL | 0 refills | Status: DC

## 2023-10-18 MED ORDER — ACETAMINOPHEN 325 MG PO TABS: 650.0000 mg | ORAL_TABLET | Freq: Four times a day (QID) | ORAL | 0 refills | Status: AC | PRN

## 2023-10-18 NOTE — ED Triage Notes (Signed)
Pt c/o HA to back of head head x 5 days-taking tylenol with minimal relief-has not sought medical care for c/o until today-NAD-steady gait

## 2023-10-18 NOTE — ED Provider Notes (Signed)
[facility] - URGENT [facility]ARE CENTER  Note:  This document was prepared using Conservation officer, historic buildings and may include unintentional dictation errors.  MRN: [id number] DOB: 15-Dec-1945  Subjective:   Samantha Miranda is a 77 y.o. female presenting for 5-day history of intermittent moderate to severe neck pain that radiates up to the back of her head.  No weakness, confusion, numbness or tingling, vision changes.  Does not take blood pressure medicine.  Reports that her blood pressure checks at home have been normal, specifically less than 120 systolic.  Has used Tylenol and hydrocodone with very temporary relief.   No current facility-administered medications for this encounter.  Current Outpatient Medications:    Biotin w/ Vitamins C & E (HAIR SKIN & NAILS GUMMIES PO), Take 2 each by mouth daily., Disp: , Rfl:    Calcium-Magnesium-Zinc (CAL-MAG-ZINC PO), Take 2 tablets by mouth daily., Disp: , Rfl:    cholecalciferol (VITAMIN D3) 25 MCG (1000 UNIT) tablet, Take 1,000 Units by mouth daily., Disp: , Rfl:    ezetimibe (ZETIA) 10 MG tablet, TAKE ONE TABLET BY MOUTH ONE TIME DAILY, Disp: 90 tablet, Rfl: 1   fluticasone (FLONASE) 50 MCG/ACT nasal spray, Place 2 sprays into both nostrils daily. (Patient taking differently: Place 2 sprays into both nostrils daily as needed for allergies.), Disp: 16 g, Rfl: 5   fluticasone-salmeterol (ADVAIR) 100-50 MCG/ACT AEPB, INHALE ONE PUFF BY MOUTH TWICE A DAY, Disp: 60 each, Rfl: 5   folic acid (FOLVITE) 1 MG tablet, , Disp: , Rfl:    gabapentin (NEURONTIN) 100 MG capsule, TAKE ONE CAPSULE BY MOUTH THREE TIMES A DAY, Disp: 90 capsule, Rfl: 0   hydrocortisone (ANUSOL-HC) 2.5 % rectal cream, Place 1 Application rectally 2 (two) times daily as needed for hemorrhoids or anal itching., Disp: 30 g, Rfl: 1   hydroxychloroquine (PLAQUENIL) 200 MG tablet, Take 1 tablet (200 mg total) by mouth daily., Disp: 30 tablet, Rfl: 5   loratadine (CLARITIN) 10 MG tablet,  Take 1 tablet (10 mg total) by mouth daily., Disp: 30 tablet, Rfl: 11   Omega-3 Fatty Acids (FISH OIL) 1200 MG CAPS, Take 1,200 mg by mouth daily., Disp: , Rfl:    ondansetron (ZOFRAN ODT) 4 MG disintegrating tablet, Take 1 tablet (4 mg total) by mouth every 8 (eight) hours as needed for nausea or vomiting., Disp: 20 tablet, Rfl: 0   ondansetron (ZOFRAN) 4 MG tablet, TAKE ONE TABLET BY MOUTH EVERY 8 HOURS AS NEEDED FOR NAUSEA OR VOMITING, Disp: 20 tablet, Rfl: 0   pantoprazole (PROTONIX) 40 MG tablet, Take 1 tablet (40 mg total) by mouth daily before breakfast., Disp: 90 tablet, Rfl: 1   predniSONE (DELTASONE) 5 MG tablet, Take 5 mg by mouth daily with breakfast. As needed fro RA flares, Disp: , Rfl:    promethazine (PHENERGAN) 25 MG tablet, Take 1 tablet (25 mg total) by mouth every 8 (eight) hours as needed for nausea or vomiting. (Patient not taking: Reported on 03/27/2023), Disp: 20 tablet, Rfl: 0   promethazine-dextromethorphan (PROMETHAZINE-DM) 6.25-15 MG/5ML syrup, Take 5 mLs by mouth 4 (four) times daily as needed., Disp: 118 mL, Rfl: 0   triamcinolone (KENALOG) 0.025 % ointment, Apply 1 Application topically 2 (two) times daily., Disp: 80 g, Rfl: 0   vitamin C (ASCORBIC ACID) 250 MG tablet, Take 250 mg by mouth daily., Disp: , Rfl:    XIIDRA 5 % SOLN, Place 1 drop into both eyes in the morning and at bedtime., Disp: , Rfl:  1   zolpidem (AMBIEN) 5 MG tablet, TAKE ONE TABLET BY MOUTH AT BEDTIME AS NEEDED FOR SLEEP, Disp: 30 tablet, Rfl: 1   Allergies  Allergen Reactions   Statins Other (See Comments)    Myalgia    Amoxicillin-Pot Clavulanate Nausea And Vomiting   Moxifloxacin Nausea And Vomiting    Past Medical History:  Diagnosis Date   Barrett's esophagus    Basal cell carcinoma of left lower leg    Breast cancer (HCC)    right   COPD (chronic obstructive pulmonary disease) (HCC) 2018   Depression 1989   Gastric ulcer    GERD (gastroesophageal reflux disease)    History of  colon polyps    Hyperlipidemia    Osteopenia      Past Surgical History:  Procedure Laterality Date   ABDOMINAL HYSTERECTOMY  11/21/1989   APPENDECTOMY  1969   BREAST LUMPECTOMY  07/22/2006   BREAST LUMPECTOMY WITH RADIOACTIVE SEED AND SENTINEL LYMPH NODE BIOPSY Right 07/02/2021   Procedure: RIGHT BREAST LUMPECTOMY WITH RADIOACTIVE SEED AND RIGHT AXILLARY SENTINEL LYMPH NODE BIOPSY;  Surgeon: Emelia Loron, MD;  Location: [facility] OR;  Service: General;  Laterality: Right;   CATARACT EXTRACTION, BILATERAL     EYE SURGERY  08/2013  10/2013   cataracts   TONSILLECTOMY      Family History  Problem Relation Age of Onset   Heart failure Mother        CHF   Hyperlipidemia Mother    Aortic stenosis Mother    Arthritis Mother    Heart disease Mother    Lung cancer Father 44   Cancer Father    Early death Father    Breast cancer Sister 11   Cancer Sister 85   Depression Sister    Stroke Maternal Aunt    Varicose Veins Maternal Aunt    Diabetes Maternal Grandmother    Hyperlipidemia Maternal Grandmother    Colon cancer Maternal Grandfather    Cancer Maternal Grandfather        colon cancer   Cancer Paternal Grandfather        unknown type cancer   Drug abuse Daughter    Heart attack Neg Hx    Hypertension Neg Hx    Sudden death Neg Hx    Esophageal cancer Neg Hx    Stomach cancer Neg Hx    Rectal cancer Neg Hx     Social History   Tobacco Use   Smoking status: Former    Current packs/day: 0.00    Average packs/day: 0.5 packs/day for 40.0 years (20.0 ttl pk-yrs)    Types: Cigarettes, E-cigarettes    Start date: 07/23/1971    Quit date: 07/23/2011    Years since quitting: 12.2   Smokeless tobacco: Never   Tobacco comments:    The last 5 yrs. I smoked .5 pack a day. Before that, 1 pk. a day  Vaping Use   Vaping status: Former   Substances: Nicotine  Substance Use Topics   Alcohol use: Yes    Comment: occasional wine   Drug use: No    ROS   Objective:    Vitals: BP (!) 164/94 (BP Location: Left Arm)   Pulse 84   Temp (!) 97.4 F (36.3 C) (Oral)   Resp 16   SpO2 95%   BP Readings from Last 3 Encounters:  10/18/23 (!) 164/94  09/28/23 (!) 154/60  05/15/23 122/80   Physical Exam Constitutional:      General: She is  not in acute distress.    Appearance: Normal appearance. She is well-developed and normal weight. She is not ill-appearing, toxic-appearing or diaphoretic.  HENT:     Head: Normocephalic and atraumatic.     Right Ear: Tympanic membrane, ear canal and external ear normal. No drainage or tenderness. No middle ear effusion. There is no impacted cerumen. Tympanic membrane is not erythematous or bulging.     Left Ear: Tympanic membrane, ear canal and external ear normal. No drainage or tenderness.  No middle ear effusion. There is no impacted cerumen. Tympanic membrane is not erythematous or bulging.     Nose: Nose normal. No congestion or rhinorrhea.     Mouth/Throat:     Mouth: Mucous membranes are moist. No oral lesions.     Pharynx: No pharyngeal swelling, oropharyngeal exudate, posterior oropharyngeal erythema or uvula swelling.     Tonsils: No tonsillar exudate or tonsillar abscesses.  Eyes:     General: No visual field deficit or scleral icterus.       Right eye: No discharge.        Left eye: No discharge.     Extraocular Movements: Extraocular movements intact.     Right eye: Normal extraocular motion.     Left eye: Normal extraocular motion.     Conjunctiva/sclera: Conjunctivae normal.     Pupils: Pupils are equal, round, and reactive to light.  Neck:     Meningeal: Brudzinski's sign and Kernig's sign absent.   Cardiovascular:     Rate and Rhythm: Normal rate.  Pulmonary:     Effort: Pulmonary effort is normal.  Musculoskeletal:     Cervical back: Normal range of motion and neck supple. No swelling, edema, deformity, erythema, signs of trauma, lacerations, rigidity, spasms, torticollis, tenderness, bony  tenderness or crepitus. Pain with movement and muscular tenderness (over area outlined) present. No spinous process tenderness. Normal range of motion.  Lymphadenopathy:     Cervical: No cervical adenopathy.  Skin:    General: Skin is warm and dry.  Neurological:     General: No focal deficit present.     Mental Status: She is alert and oriented to person, place, and time.     Cranial Nerves: No cranial nerve deficit, dysarthria or facial asymmetry.     Motor: No weakness, abnormal muscle tone or pronator drift.     Coordination: Romberg sign negative. Coordination normal. Finger-Nose-Finger Test and Heel to [facility] Test normal. Rapid alternating movements normal.     Gait: Gait and tandem walk normal.     Deep Tendon Reflexes: Reflexes normal.  Psychiatric:        Mood and Affect: Mood normal.        Behavior: Behavior normal.        Thought Content: Thought content normal.        Judgment: Judgment normal.     Assessment and Plan :   PDMP not reviewed this encounter.  1. Tension headache   2. Elevated blood pressure reading without diagnosis of hypertension    Had an extensive discussion with patient about the possibility of an acute encephalopathy.  Neurologic exam is completely normal.  Patient has reproducible movement pain, muscular tenderness of the cervical region.  Recommended conservative management with Tylenol, rest, Robaxin.  Emphasized need to maintain strict ER precautions.  Counseled patient on potential for adverse effects with medications prescribed/recommended today, ER and return-to-clinic precautions discussed, patient verbalized understanding.    Wallis Bamberg, PA-C 10/18/23 1235

## 2023-10-23 ENCOUNTER — Encounter: Payer: Self-pay | Admitting: Family Medicine

## 2023-10-23 ENCOUNTER — Other Ambulatory Visit: Payer: Self-pay | Admitting: Radiology

## 2023-10-23 DIAGNOSIS — D360 Benign neoplasm of lymph nodes: Secondary | ICD-10-CM | POA: Diagnosis not present

## 2023-10-23 DIAGNOSIS — R590 Localized enlarged lymph nodes: Secondary | ICD-10-CM | POA: Diagnosis not present

## 2023-10-24 LAB — SURGICAL PATHOLOGY

## 2023-10-30 ENCOUNTER — Encounter: Payer: Self-pay | Admitting: Adult Health

## 2023-11-03 ENCOUNTER — Ambulatory Visit
Admission: RE | Admit: 2023-11-03 | Discharge: 2023-11-03 | Disposition: A | Discharge status: Home / Self Care | Payer: Medicare PPO | Source: Ambulatory Visit | Attending: Acute Care | Admitting: Acute Care

## 2023-11-03 DIAGNOSIS — Z122 Encounter for screening for malignant neoplasm of respiratory organs: Secondary | ICD-10-CM

## 2023-11-03 DIAGNOSIS — Z87891 Personal history of nicotine dependence: Secondary | ICD-10-CM | POA: Diagnosis not present

## 2023-11-06 ENCOUNTER — Other Ambulatory Visit: Payer: Self-pay

## 2023-11-06 ENCOUNTER — Emergency Department (HOSPITAL_COMMUNITY)
Admission: EM | Admit: 2023-11-06 | Discharge: 2023-11-06 | Disposition: A | Discharge status: Home / Self Care | Payer: Medicare PPO | Attending: Emergency Medicine | Admitting: Emergency Medicine

## 2023-11-06 ENCOUNTER — Inpatient Hospital Stay: Payer: Medicare PPO | Attending: Adult Health | Admitting: Adult Health

## 2023-11-06 ENCOUNTER — Emergency Department (HOSPITAL_COMMUNITY): Payer: Medicare PPO

## 2023-11-06 ENCOUNTER — Encounter (HOSPITAL_COMMUNITY): Payer: Self-pay | Admitting: *Deleted

## 2023-11-06 VITALS — BP 146/38 | HR 42 | Temp 97.6°F | Resp 16 | Wt 125.2 lb

## 2023-11-06 DIAGNOSIS — R009 Unspecified abnormalities of heart beat: Secondary | ICD-10-CM | POA: Diagnosis not present

## 2023-11-06 DIAGNOSIS — Z171 Estrogen receptor negative status [ER-]: Secondary | ICD-10-CM

## 2023-11-06 DIAGNOSIS — R0602 Shortness of breath: Secondary | ICD-10-CM | POA: Insufficient documentation

## 2023-11-06 DIAGNOSIS — R9389 Abnormal findings on diagnostic imaging of other specified body structures: Secondary | ICD-10-CM | POA: Diagnosis not present

## 2023-11-06 DIAGNOSIS — C50111 Malignant neoplasm of central portion of right female breast: Secondary | ICD-10-CM | POA: Diagnosis not present

## 2023-11-06 DIAGNOSIS — R001 Bradycardia, unspecified: Secondary | ICD-10-CM

## 2023-11-06 LAB — CBC WITH DIFFERENTIAL/PLATELET
Abs Immature Granulocytes: 0.01 10*3/uL (ref 0.00–0.07)
Basophils Absolute: 0.1 10*3/uL (ref 0.0–0.1)
Basophils Relative: 1 %
Eosinophils Absolute: 0.2 10*3/uL (ref 0.0–0.5)
Eosinophils Relative: 4 %
HCT: 40.4 % (ref 36.0–46.0)
Hemoglobin: 13.7 g/dL (ref 12.0–15.0)
Immature Granulocytes: 0 %
Lymphocytes Relative: 36 %
Lymphs Abs: 2.2 10*3/uL (ref 0.7–4.0)
MCH: 31.1 pg (ref 26.0–34.0)
MCHC: 33.9 g/dL (ref 30.0–36.0)
MCV: 91.6 fL (ref 80.0–100.0)
Monocytes Absolute: 0.6 10*3/uL (ref 0.1–1.0)
Monocytes Relative: 10 %
Neutro Abs: 3 10*3/uL (ref 1.7–7.7)
Neutrophils Relative %: 49 %
Platelets: 232 10*3/uL (ref 150–400)
RBC: 4.41 MIL/uL (ref 3.87–5.11)
RDW: 13.2 % (ref 11.5–15.5)
WBC: 6 10*3/uL (ref 4.0–10.5)
nRBC: 0 % (ref 0.0–0.2)

## 2023-11-06 LAB — COMPREHENSIVE METABOLIC PANEL
ALT: 16 U/L (ref 0–44)
AST: 28 U/L (ref 15–41)
Albumin: 4 g/dL (ref 3.5–5.0)
Alkaline Phosphatase: 51 U/L (ref 38–126)
Anion gap: 8 (ref 5–15)
BUN: 12 mg/dL (ref 8–23)
CO2: 24 mmol/L (ref 22–32)
Calcium: 9.1 mg/dL (ref 8.9–10.3)
Chloride: 103 mmol/L (ref 98–111)
Creatinine, Ser: 0.93 mg/dL (ref 0.44–1.00)
GFR, Estimated: >60 mL/min (ref >60)
Glucose, Bld: 94 mg/dL (ref 70–99)
Potassium: 3.7 mmol/L (ref 3.5–5.1)
Sodium: 135 mmol/L (ref 135–145)
Total Bilirubin: 0.5 mg/dL (ref <1.2)
Total Protein: 7.3 g/dL (ref 6.5–8.1)

## 2023-11-06 LAB — TROPONIN I (HIGH SENSITIVITY): Troponin I (High Sensitivity): 11 ng/L (ref <18)

## 2023-11-06 NOTE — Assessment & Plan Note (Signed)
Samantha Miranda is a 77 year old woman with stage IA ER/PR positive breast cancer diagnosed in August 2022 status post lumpectomy, Oncotype 28, declined chemotherapy, not a candidate for radiation, and unable to tolerate antiestrogen therapy.  Breast Cancer Survivor No evidence of malignancy on mammogram from August 2024. No changes or concerns on physical exam. -Continue routine surveillance as per guidelines.  Dyspnea and Lightheadedness New onset, intermittent, associated with exercise. Blood pressure readings variable at home, with some elevated readings. -EKG demonstrates AV block.  Considering this and her symptoms will send to emergency room for urgent evaluation and management  General Health Maintenance Lung cancer screening CT from December 2023 was negative. -Repeat lung cancer screening CT in December 2024 completed and final read is pending  RTC in 6 months for breast cancer surveillance

## 2023-11-06 NOTE — Progress Notes (Signed)
Taken to ER at [facility] via w/c for shortness of breath and abnormal EKG at 1 pm after office visit at [facility]. Report given to charge nurse.

## 2023-11-06 NOTE — ED Provider Notes (Signed)
[place] EMERGENCY DEPARTMENT AT [facility] Provider Note   CSN: [id number] Arrival date & time: 11/06/23  1300     History  Chief Complaint  Patient presents with   Shortness of Breath   Irregular Heart Beat    Samantha Miranda is a 77 y.o. female.  77 year old female who presents from cancer center due to slow heart rate.  Patient has a heart rate of 39.  States that symptoms began yesterday.  She has no chest pain or chest pressure.  Does not take any rate control agents.  Has had no syncope or near syncope.  States she has been short of breath for about a week but that it is unchanged.  No recent history of volume loss.  Was sent here for further evaluation       Home Medications Prior to Admission medications   Medication Sig Start Date End Date Taking? Authorizing Provider  acetaminophen (TYLENOL) 325 MG tablet Take 2 tablets (650 mg total) by mouth every 6 (six) hours as needed for moderate pain (pain score 4-6). 10/18/23   Wallis Bamberg, PA-C  Biotin w/ Vitamins C & E (HAIR SKIN & NAILS GUMMIES PO) Take 2 each by mouth daily.    [provider]  Calcium-Magnesium-Zinc (CAL-MAG-ZINC PO) Take 2 tablets by mouth daily.    [provider]  cholecalciferol (VITAMIN D3) 25 MCG (1000 UNIT) tablet Take 1,000 Units by mouth daily.    [provider]  ezetimibe (ZETIA) 10 MG tablet TAKE ONE TABLET BY MOUTH ONE TIME DAILY 09/25/23   Zola Button, Grayling Congress, DO  fluticasone (FLONASE) 50 MCG/ACT nasal spray Place 2 sprays into both nostrils daily. Patient taking differently: Place 2 sprays into both nostrils daily as needed for allergies. 04/29/19   Seabron Spates R, DO  fluticasone-salmeterol (ADVAIR) 100-50 MCG/ACT AEPB INHALE ONE PUFF BY MOUTH TWICE A DAY 01/24/23   Zola Button, Grayling Congress, DO  folic acid (FOLVITE) 1 MG tablet  09/07/22   [provider]  gabapentin (NEURONTIN) 100 MG capsule TAKE ONE CAPSULE BY MOUTH THREE TIMES A DAY  09/29/23   Pollyann Samples, NP  hydrocortisone (ANUSOL-HC) 2.5 % rectal cream Place 1 Application rectally 2 (two) times daily as needed for hemorrhoids or anal itching. 05/15/23   Zola Button, Grayling Congress, DO  hydroxychloroquine (PLAQUENIL) 200 MG tablet Take 1 tablet (200 mg total) by mouth daily. 05/15/23   Donato Schultz, DO  loratadine (CLARITIN) 10 MG tablet Take 1 tablet (10 mg total) by mouth daily. 12/20/21   Donato Schultz, DO  methocarbamol (ROBAXIN) 500 MG tablet Take 1 tablet (500 mg total) by mouth 2 (two) times daily. 10/18/23   Wallis Bamberg, PA-C  Omega-3 Fatty Acids (FISH OIL) 1200 MG CAPS Take 1,200 mg by mouth daily.    [provider]  ondansetron (ZOFRAN ODT) 4 MG disintegrating tablet Take 1 tablet (4 mg total) by mouth every 8 (eight) hours as needed for nausea or vomiting. 08/12/21   Hyman Hopes, Margaux, PA-C  ondansetron (ZOFRAN) 4 MG tablet TAKE ONE TABLET BY MOUTH EVERY 8 HOURS AS NEEDED FOR NAUSEA OR VOMITING 04/11/23   Zola Button, Grayling Congress, DO  pantoprazole (PROTONIX) 40 MG tablet Take 1 tablet (40 mg total) by mouth daily before breakfast. 08/29/23   Zola Button, Grayling Congress, DO  predniSONE (DELTASONE) 5 MG tablet Take 5 mg by mouth daily with breakfast. As needed fro RA flares    [provider]  promethazine (PHENERGAN) 25 MG tablet Take 1 tablet (25 mg total) by mouth every 8 (eight) hours as needed for nausea or vomiting. Patient not taking: Reported on 03/27/2023 01/05/22   Vivi Barrack, DPM  promethazine-dextromethorphan (PROMETHAZINE-DM) 6.25-15 MG/5ML syrup Take 5 mLs by mouth 4 (four) times daily as needed. 06/20/22   Seabron Spates R, DO  triamcinolone (KENALOG) 0.025 % ointment Apply 1 Application topically 2 (two) times daily. 09/28/23   Loa Socks, NP  vitamin C (ASCORBIC ACID) 250 MG tablet Take 250 mg by mouth daily.    [provider]  XIIDRA 5 % SOLN Place 1 drop into both eyes in the morning and at bedtime.  05/08/17   [provider]  zolpidem (AMBIEN) 5 MG tablet TAKE ONE TABLET BY MOUTH AT BEDTIME AS NEEDED FOR SLEEP 09/08/22   Donato Schultz, DO      Allergies    Statins, Amoxicillin-pot clavulanate, and Moxifloxacin    Review of Systems   Review of Systems  All other systems reviewed and are negative.   Physical Exam Updated Vital Signs BP (!) 163/53   Pulse (!) 39   Resp 15   Ht 1.6 m (5\' 3" )   Wt 56.7 kg   SpO2 99%   BMI 22.14 kg/m  Physical Exam Vitals and nursing note reviewed.  Constitutional:      General: She is not in acute distress.    Appearance: Normal appearance. She is well-developed. She is not toxic-appearing.  HENT:     Head: Normocephalic and atraumatic.  Eyes:     General: Lids are normal.     Conjunctiva/sclera: Conjunctivae normal.     Pupils: Pupils are equal, round, and reactive to light.  Neck:     Thyroid: No thyroid mass.     Trachea: No tracheal deviation.  Cardiovascular:     Rate and Rhythm: Bradycardia present. Rhythm irregular.     Heart sounds: Normal heart sounds. No murmur heard.    No gallop.  Pulmonary:     Effort: Pulmonary effort is normal. No respiratory distress.     Breath sounds: Normal breath sounds. No stridor. No decreased breath sounds, wheezing, rhonchi or rales.  Abdominal:     General: There is no distension.     Palpations: Abdomen is soft.     Tenderness: There is no abdominal tenderness. There is no rebound.  Musculoskeletal:        General: No tenderness. Normal range of motion.     Cervical back: Normal range of motion and neck supple.  Skin:    General: Skin is warm and dry.     Findings: No abrasion or rash.  Neurological:     Mental Status: She is alert and oriented to person, place, and time. Mental status is at baseline.     GCS: GCS eye subscore is 4. GCS verbal subscore is 5. GCS motor subscore is 6.     Cranial Nerves: No cranial nerve deficit.     Sensory: No sensory deficit.      Motor: Motor function is intact.  Psychiatric:        Attention and Perception: Attention normal.        Speech: Speech normal.        Behavior: Behavior normal.     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  Procedures Procedures    Medications Ordered in ED Medications - No data to display  ED Course/ Medical Decision Making/ A&P                                 Medical Decision Making Amount and/or Complexity of Data Reviewed Labs: ordered. Radiology: ordered.   Patient's EKG per my interpretation shows heart block with a rate of 40.  This was discussed with Dr. Elease Hashimoto from cardiology who reviewed the EKG personally.  He discussed it with one of the EP cardiologist.  They feel that the patient can safely follow-up in their clinic due to the patient having no symptoms such as syncope or being lightheaded.  Patient's electrolytes are negative for any obvious source of this.  Chest x-ray without significant findings.  Patient states that her shortness of breath has been stable for over a week.  Low suspicion for PE.  Does have history of COPD and suspect that this is the etiology.  She has no wheezing at this time.  Troponin negative as well 2.  Plan will be for patient to follow-up in the clinic  CRITICAL CARE Performed by: Toy Baker Total critical care time: 50 minutes Critical care time was exclusive of separately billable procedures and  treating other patients. Critical care was necessary to treat or prevent imminent or life-threatening deterioration. Critical care was time spent personally by me on the following activities: development of treatment plan with patient and/or surrogate as well as nursing, discussions with consultants, evaluation of patient's response to treatment, examination of patient, obtaining history from patient or surrogate, ordering and performing treatments and interventions, ordering and review of laboratory studies, ordering and review of radiographic studies, pulse oximetry and re-evaluation of patient's condition.         Final Clinical Impression(s) / ED Diagnoses Final diagnoses:  None    Rx / DC Orders ED Discharge Orders     None         Lorre Nick, MD 11/06/23 1444

## 2023-11-06 NOTE — ED Triage Notes (Signed)
Brought from Cancer Center due to shob and irr HR as she was being seen for a Breast Cancer F/U appointment. Reports a couple of weeks of SHOB, Today HR 40 with noted irregular rate. No noted cardiovascular problems prior.

## 2023-11-06 NOTE — ED Notes (Signed)
Assisted pt to bedside commode, pt heart rate went up 80-90, pt relaxing and on cell phone

## 2023-11-06 NOTE — Progress Notes (Signed)
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 [facility]) Staging form: Breast, AJCC 8th Edition - Clinical stage from 06/21/2021: Stage IA (cT1b, cN0, cM0, G1, ER+, PR+, HER2-) - Signed by Malachy Mood, MD on 06/23/2021 Stage prefix: Initial diagnosis Histologic grading system: 3 grade system - Pathologic stage from 07/02/2021: Stage IA (pT1c, pN0, cM0, G1, ER+, PR+, HER2-, Oncotype DX score: 28) - Signed by Malachy Mood, MD on 07/21/2021 Stage prefix: Initial diagnosis Multigene prognostic tests performed: Oncotype DX Recurrence score range: Greater than or equal to 11 Histologic grading system: 3 grade system   SUMMARY OF ONCOLOGIC HISTORY: Oncology History Overview Note  Cancer Staging 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 [facility]) Staging form: Breast, AJCC 8th Edition - Clinical stage from 06/21/2021: Stage IA (cT1b, cN0, cM0, G1, ER+, PR+, HER2-) - Signed by Malachy Mood, MD on 06/23/2021 Stage prefix: Initial diagnosis Histologic grading system: 3 grade system - Pathologic stage from 07/02/2021: Stage IA (pT1c, pN0, cM0, G1, ER+, PR+, HER2-, Oncotype DX score: 28) - Signed by Malachy Mood, MD on 07/21/2021 Stage prefix: Initial diagnosis Multigene prognostic tests performed: Oncotype DX Recurrence score range: Greater than or equal to 11 Histologic grading system: 3 grade system     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   06/17/2021 Pathology Results   Diagnosis Breast, right, needle core biopsy, 3:00, 4 cmfn  - INVASIVE MAMMARY CARCINOMA, SEE COMMENT  - MAMMARY CARCINOMA IN SITU. The carcinoma appears grade 1. E-cadherin is positive in the  invasive and in situ carcinoma consistent with a ductal phenotype.  PROGNOSTIC INDICATORS Results: IMMUNOHISTOCHEMICAL AND MORPHOMETRIC ANALYSIS PERFORMED MANUALLY The tumor cells are EQUIVOCAL for HER2 (2+). Her2 by FISH will be performed and results reported separately. 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 BREAST, RIGHT, LUMPECTOMY:  - Invasive and in situ ductal carcinoma, 1.2 cm.  - Carcinoma is 0.7 cm from superior margin.  - Biopsy site and biopsy clip.  - See oncology table.   B. BREAST, RIGHT ADDITIONAL INFERIOR MARGIN, EXCISION:  - Fibrocystic changes.  - Final inferior margin negative for carcinoma.   C. LYMPH NODE, RIGHT AXILLARY, SENTINEL, EXCISION:  - One lymph node with capsular nevus and negative for metastatic  carcinoma (0/1).   D. LYMPH NODE, RIGHT AXILLARY, SENTINEL, EXCISION:  - One lymph node negative for metastatic carcinoma (0/1).   E. LYMPH NODE, RIGHT AXILLARY, SENTINEL, EXCISION:  - One lymph node negative for metastatic carcinoma (0/1).   F. LYMPH NODE, RIGHT AXILLARY, SENTINEL, EXCISION:  - One lymph node negative for metastatic carcinoma (0/1).  G. LYMPH NODE, RIGHT AXILLARY, SENTINEL, EXCISION:  - One lymph node negative for metastatic carcinoma (0/1).   H. BREAST, RIGHT ADDITIONAL LATERAL MARGIN, EXCISION:  - Fibrocystic changes.  - Final lateral margin negative for carcinoma.   I. BREAST,  RIGHT ADDITIONAL POSTERIOR MARGIN, EXCISION:  - Benign breast tissue.  - Final posterior margin negative for carcinoma.    07/02/2021 Cancer Staging   Staging form: Breast, AJCC 8th Edition - Pathologic stage from 07/02/2021: Stage IA (pT1c, pN0, cM0, G1, ER+, PR+, HER2-, Oncotype DX score: 28) - Signed by Malachy Mood, MD on 07/21/2021 Stage prefix: Initial diagnosis Multigene prognostic tests performed: Oncotype DX Recurrence score range: Greater than or equal to 11 Histologic grading system: 3 grade system   03/2022 -  Anti-estrogen oral therapy   Unable to tolerate any of the aromatase inhibitors or low-dose tamoxifen.     CURRENT THERAPY: observation  INTERVAL HISTORY:  Discussed the use of AI scribe software for clinical note transcription with the patient, who gave verbal consent to proceed.  Samantha Miranda 77 y.o. female  a breast cancer survivor, presents with recent episodes of weakness, shortness of breath, and lightheadedness. These symptoms have been particularly noticeable during her exercise class, with one instance almost causing her to discontinue the session. The patient reports feeling 'out of breath' and 'weak' after these classes, with intermittent episodes of similar symptoms occurring since the initial episode.  The patient has also noticed fluctuations in her blood pressure, which she monitors at home. Readings have varied widely, with some as low as 120/114 and others as high as 154/40, particularly after exercise. This is a new development, as the patient has no prior history of hypertension.  The patient has not sought care from any other healthcare providers recently. She has been adhering to her prescribed breast cancer follow-up regimen, with a mammogram in August showing no evidence of malignancy and a lung cancer screening in December, 2023 also returning negative results.  The patient also reports a recent issue with breast itching, which has since resolved with  the application of a prescribed cream. No other changes or concerns regarding her breasts have been noted.   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 (HCC) 06/23/2021   Depression, major, single episode, 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   Influenza vaccine administered 11/02/2015   Otitis media 08/10/2015   Acute bronchitis 04/21/2015   Right ankle pain 02/10/2014   Left shoulder pain 11/22/2013   Right shoulder pain 08/20/2013   Near syncope 08/12/2013   Other malaise and fatigue 08/12/2013   Rectal itching 07/01/2013   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   Gastroesophageal reflux disease 08/12/2012   Former heavy tobacco smoker 08/26/2010   History of colonic polyps 08/26/2010   POSTMENOPAUSAL STATUS 08/26/2010   POSTMENOPAUSAL BLEEDING 04/13/2009   Hyperlipidemia 07/11/2007   DEPRESSION 07/11/2007   OSTEOPENIA 07/11/2007   BREAST CANCER, HX OF 07/11/2007    is allergic  to statins, amoxicillin-pot clavulanate, and moxifloxacin.  MEDICAL HISTORY: Past Medical History:  Diagnosis Date   Barrett's esophagus    Basal cell carcinoma of left lower leg    Breast cancer (HCC)    right   COPD (chronic obstructive pulmonary disease) (HCC) 2018   Depression 1989   Gastric ulcer    GERD (gastroesophageal reflux disease)    History of colon polyps    Hyperlipidemia    Osteopenia     SURGICAL HISTORY: Past  Surgical History:  Procedure Laterality Date   ABDOMINAL HYSTERECTOMY  11/21/1989   APPENDECTOMY  1969   BREAST LUMPECTOMY  07/22/2006   BREAST LUMPECTOMY WITH RADIOACTIVE SEED AND SENTINEL LYMPH NODE BIOPSY Right 07/02/2021   Procedure: RIGHT BREAST LUMPECTOMY WITH RADIOACTIVE SEED AND RIGHT AXILLARY SENTINEL LYMPH NODE BIOPSY;  Surgeon: Emelia Loron, MD;  Location: [facility] OR;  Service: General;  Laterality: Right;   CATARACT EXTRACTION, BILATERAL     EYE SURGERY  08/2013  10/2013   cataracts   TONSILLECTOMY      SOCIAL HISTORY: Social History   Socioeconomic History   Marital status: Married    Spouse name: Not on file   Number of children: 1   Years of education: Not on file   Highest education level: 12th grade  Occupational History   Occupation: retired    Associate Professor: [employer]    Comment: retired  Tobacco Use   Smoking status: Former    Current packs/day: 0.00    Average packs/day: 0.5 packs/day for 40.0 years (20.0 ttl pk-yrs)    Types: Cigarettes, E-cigarettes    Start date: 07/23/1971    Quit date: 07/23/2011    Years since quitting: 12.2   Smokeless tobacco: Never   Tobacco comments:    The last 5 yrs. I smoked .5 pack a day. Before that, 1 pk. a day  Vaping Use   Vaping status: Former   Substances: Nicotine  Substance and Sexual Activity   Alcohol use: Yes    Comment: occasional wine   Drug use: No   Sexual activity: Not Currently    Partners: Male    Birth control/protection: Post-menopausal  Other Topics Concern   Not on file  Social History Narrative   Exercise-virtual class, MWF   Social Drivers of Corporate investment banker Strain: Low Risk  (09/11/2023)   Overall Financial Resource Strain (CARDIA)    Difficulty of Paying Living Expenses: Not hard at all  Food Insecurity: No Food Insecurity (09/11/2023)   Hunger Vital Sign    Worried About Running Out of Food in the Last Year: Never true    Ran Out of Food in the Last Year: Never  true  Transportation Needs: No Transportation Needs (09/11/2023)   PRAPARE - Administrator, Civil Service (Medical): No    Lack of Transportation (Non-Medical): No  Physical Activity: Sufficiently Active (09/11/2023)   Exercise Vital Sign    Days of Exercise per Week: 3 days    Minutes of Exercise per Session: 50 min  Stress: No Stress Concern Present (09/11/2023)   [employer] of Occupational Health - Occupational Stress Questionnaire    Feeling of Stress : Only a little  Social Connections: Moderately Isolated (09/11/2023)   Social Connection and Isolation Panel [NHANES]    Frequency of Communication with Friends and Family: Twice a week    Frequency of Social Gatherings with Friends and Family: Once a week    Attends Religious Services: Never  Active Member of Clubs or Organizations: No    Attends Banker Meetings: Never    Marital Status: Married  Catering manager Violence: Not At Risk (09/14/2023)   Humiliation, Afraid, Rape, and Kick questionnaire    Fear of Current or Ex-Partner: No    Emotionally Abused: No    Physically Abused: No    Sexually Abused: No    FAMILY HISTORY: Family History  Problem Relation Age of Onset   Heart failure Mother        CHF   Hyperlipidemia Mother    Aortic stenosis Mother    Arthritis Mother    Heart disease Mother    Lung cancer Father 57   Cancer Father    Early death Father    Breast cancer Sister 30   Cancer Sister 71   Depression Sister    Stroke Maternal Aunt    Varicose Veins Maternal Aunt    Diabetes Maternal Grandmother    Hyperlipidemia Maternal Grandmother    Colon cancer Maternal Grandfather    Cancer Maternal Grandfather        colon cancer   Cancer Paternal Grandfather        unknown type cancer   Drug abuse Daughter    Heart attack Neg Hx    Hypertension Neg Hx    Sudden death Neg Hx    Esophageal cancer Neg Hx    Stomach cancer Neg Hx    Rectal cancer Neg Hx     Review of  Systems  Constitutional:  Positive for fatigue. Negative for appetite change, chills, fever and unexpected weight change.  HENT:   Negative for hearing loss, lump/mass and trouble swallowing.   Eyes:  Negative for eye problems and icterus.  Respiratory:  Positive for chest tightness and shortness of breath. Negative for cough.   Cardiovascular:  Positive for chest pain and palpitations. Negative for leg swelling.  Gastrointestinal:  Negative for abdominal distention, abdominal pain, constipation, diarrhea, nausea and vomiting.  Endocrine: Negative for hot flashes.  Genitourinary:  Negative for difficulty urinating.   Musculoskeletal:  Negative for arthralgias.  Skin:  Negative for itching and rash.  Neurological:  Positive for headaches and light-headedness. Negative for dizziness, extremity weakness and numbness.  Hematological:  Negative for adenopathy. Does not bruise/bleed easily.  Psychiatric/Behavioral:  Negative for depression. The patient is not nervous/anxious.       PHYSICAL EXAMINATION    Vitals:   11/06/23 1212 11/06/23 1213  BP: (!) 162/44 (!) 146/38  Pulse: (!) 44 (!) 42  Resp: 16   Temp: 97.6 F (36.4 C)   SpO2: 99%     Physical Exam Constitutional:      General: She is not in acute distress.    Appearance: Normal appearance. She is not toxic-appearing.  HENT:     Head: Normocephalic and atraumatic.     Mouth/Throat:     Mouth: Mucous membranes are moist.     Pharynx: Oropharynx is clear. No oropharyngeal exudate or posterior oropharyngeal erythema.  Eyes:     General: No scleral icterus. Cardiovascular:     Rate and Rhythm: Bradycardia present. Rhythm irregular.  Pulmonary:     Effort: Pulmonary effort is normal.     Breath sounds: Normal breath sounds.  Chest:     Comments: Right breast status post lobectomy no sign of local recurrence left breast is benign. Abdominal:     General: Abdomen is flat. Bowel sounds are normal. There is no distension.  Palpations: Abdomen is soft.     Tenderness: There is no abdominal tenderness.  Musculoskeletal:        General: No swelling.     Cervical back: Neck supple.  Lymphadenopathy:     Cervical: No cervical adenopathy.     Upper Body:     Right upper body: No axillary adenopathy.     Left upper body: No axillary adenopathy.  Skin:    General: Skin is warm and dry.     Findings: No rash.  Neurological:     General: No focal deficit present.     Mental Status: She is alert.  Psychiatric:        Mood and Affect: Mood normal.        Behavior: Behavior normal.     LABORATORY DATA:  CBC    Component Value Date/Time   WBC 4.9 09/28/2023 1235   WBC 5.9 01/31/2023 1440   RBC 4.16 09/28/2023 1235   HGB 13.0 09/28/2023 1235   HGB 12.8 10/03/2006 1235   HCT 37.7 09/28/2023 1235   HCT 37.1 10/03/2006 1235   PLT 171 09/28/2023 1235   PLT 235 10/03/2006 1235   MCV 90.6 09/28/2023 1235   MCV 92.9 10/03/2006 1235   MCH 31.3 09/28/2023 1235   MCHC 34.5 09/28/2023 1235   RDW 12.9 09/28/2023 1235   RDW 13.2 10/03/2006 1235   LYMPHSABS 1.4 09/28/2023 1235   LYMPHSABS 2.3 10/03/2006 1235   MONOABS 0.4 09/28/2023 1235   MONOABS 0.4 10/03/2006 1235   EOSABS 0.1 09/28/2023 1235   EOSABS 0.3 10/03/2006 1235   BASOSABS 0.1 09/28/2023 1235   BASOSABS 0.1 10/03/2006 1235    CMP     Component Value Date/Time   NA 139 09/28/2023 1235   K 4.0 09/28/2023 1235   CL 104 09/28/2023 1235   CO2 29 09/28/2023 1235   GLUCOSE 82 09/28/2023 1235   BUN 17 09/28/2023 1235   CREATININE 1.05 (H) 09/28/2023 1235   CREATININE 1.00 12/14/2021 1527   CALCIUM 9.5 09/28/2023 1235   PROT 7.0 09/28/2023 1235   ALBUMIN 4.0 09/28/2023 1235   AST 22 09/28/2023 1235   ALT 12 09/28/2023 1235   ALKPHOS 49 09/28/2023 1235   BILITOT 0.4 09/28/2023 1235   GFRNONAA 55 (L) 09/28/2023 1235   GFRAA >60 03/19/2016 1028          ASSESSMENT and THERAPY PLAN:   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  Breast Cancer Survivor No evidence of malignancy on mammogram from August 2024. No changes or concerns on physical exam. -Continue routine surveillance as per guidelines.  Dyspnea and Lightheadedness New onset, intermittent, associated with exercise. Blood pressure readings variable at home, with some elevated readings. -EKG demonstrates AV block.  Considering this and her symptoms will send to emergency room for urgent evaluation and management  General Health Maintenance Lung cancer screening CT from December 2023 was negative. -Repeat lung cancer screening CT in December 2024 completed and final read is pending  RTC in 6 months for breast cancer surveillance  The above reviewed with Dr. Pamelia Hoit who is in agreement with assessment and plan.   All questions were answered. The patient knows to call the clinic with any problems, questions or concerns. We can certainly see  the patient much sooner if necessary.  Total encounter time:30 minutes*in face-to-face visit time, chart review, lab review, care coordination, order entry, and documentation of the encounter time.    Lillard Anes, NP 11/06/23 1:16 PM Medical Oncology and Hematology [facility] [address] [place], [place] [address] Tel. [phone]    Fax. [phone]  *Total Encounter Time as defined by the Centers for Medicare and Medicaid Services includes, in addition to the face-to-face time of a patient visit (documented in the note above) non-face-to-face time: obtaining and reviewing outside history, ordering and reviewing medications, tests or procedures, care coordination (communications with other health care professionals or caregivers) and documentation in the  medical record.

## 2023-11-07 ENCOUNTER — Encounter: Payer: Self-pay | Admitting: Adult Health

## 2023-11-17 ENCOUNTER — Encounter: Payer: Self-pay | Admitting: Cardiology

## 2023-11-17 ENCOUNTER — Ambulatory Visit: Payer: Medicare PPO | Attending: Cardiology | Admitting: Cardiology

## 2023-11-17 ENCOUNTER — Encounter: Payer: Self-pay | Admitting: *Deleted

## 2023-11-17 ENCOUNTER — Telehealth: Payer: Self-pay

## 2023-11-17 VITALS — BP 150/80 | HR 76 | Ht 63.0 in | Wt 121.6 lb

## 2023-11-17 DIAGNOSIS — I2510 Atherosclerotic heart disease of native coronary artery without angina pectoris: Secondary | ICD-10-CM | POA: Diagnosis not present

## 2023-11-17 DIAGNOSIS — Z122 Encounter for screening for malignant neoplasm of respiratory organs: Secondary | ICD-10-CM

## 2023-11-17 DIAGNOSIS — I442 Atrioventricular block, complete: Secondary | ICD-10-CM | POA: Diagnosis not present

## 2023-11-17 DIAGNOSIS — Z87891 Personal history of nicotine dependence: Secondary | ICD-10-CM

## 2023-11-17 NOTE — Progress Notes (Signed)
  Electrophysiology Office Note:   Date:  11/17/2023  ID:  Samantha Miranda, DOB 1946/03/17, MRN [id number]  Primary Cardiologist: None Primary Heart Failure: None Electrophysiologist: Islay Polanco Jorja Loa, MD      History of Present Illness:   Samantha Miranda is a 77 y.o. female with h/o right-sided breast cancer, COPD, hyperlipidemia seen today for  for Electrophysiology evaluation of Samantha Miranda at the request of complete heart block.   . She presented to the emergency room from the cancer center due to slow heart rates.  She was found to be in complete heart block.  She has days that she feels well and other days that she feels quite poorly.  She felt poorly when she went to the emergency room.  She feels that on the days that she cannot do her daily activities and has quite a bit less energy.  Otherwise, she does exercise quite a bit with her husband.  Review of systems complete and found to be negative unless listed in HPI.   EP Information / Studies Reviewed:    EKG is not ordered today. EKG from 11/04/23 reviewed which showed sinus rhythm, complete heart block        Risk Assessment/Calculations:            Physical Exam:   VS:  BP (!) 150/80 (BP Location: Left Arm, Patient Position: Sitting, Cuff Size: Normal)   Pulse 76   Ht 5\' 3"  (1.6 m)   Wt 121 lb 9.6 oz (55.2 kg)   SpO2 97%   BMI 21.54 kg/m    Wt Readings from Last 3 Encounters:  11/17/23 121 lb 9.6 oz (55.2 kg)  11/06/23 125 lb (56.7 kg)  11/06/23 125 lb 3.2 oz (56.8 kg)     GEN: Well nourished, well developed in no acute distress NECK: No JVD; No carotid bruits CARDIAC: Regular rate and rhythm, no murmurs, rubs, gallops RESPIRATORY:  Clear to auscultation without rales, wheezing or rhonchi  ABDOMEN: Soft, non-tender, non-distended EXTREMITIES:  No edema; No deformity   ASSESSMENT AND PLAN:    1.  Complete heart block: No obvious cause.  Complete heart block is intermittent and she does have days that  she feels well.  She would benefit from pacemaker implant.  Risk and benefits have been discussed.  This include bleeding, tamponade, infection, pneumothorax, lead dislodgment, MI, death, stroke, worsening renal function.  She understands these risks and is agreed to the procedure.  2.  Coronary artery calcifications: Found on surveillance CT for breast cancer.  Evaluation post pacemaker implant.  Follow up with Dr. Elberta Fortis as usual post procedure  Signed, Sorcha Rotunno Jorja Loa, MD

## 2023-11-17 NOTE — Telephone Encounter (Signed)
Called and spoke with patient. Reviewed CT results. 6 month CT scan ordered to reevaluate new small nodules. Patient has no questions.

## 2023-11-17 NOTE — Patient Instructions (Signed)
Medication Instructions:  Your physician recommends that you continue on your current medications as directed. Please refer to the Current Medication list given to you today.     * If you need a refill on your cardiac medications before your next appointment, please call your pharmacy. *   Labwork: None ordered  * Will notify you of abnormal results, otherwise continue current treatment plan.*   Testing/Procedures: Your physician has recommended that you have a pacemaker inserted. A pacemaker is a small device that is placed under the skin of your chest or abdomen to help control abnormal heart rhythms. This device uses electrical pulses to prompt the heart to beat at a normal rate. Pacemakers are used to treat heart rhythms that are too slow. Wire (leads) are attached to the pacemaker that goes into the chambers of you heart. This is done in the hospital and usually requires and overnight stay. Please follow your instruction letter.   Follow-Up: You will be scheduled for a 2 week wound check and 3 month physician check.   Thank you for choosing [facility]!!   Dory Horn, RN [phone]   Other Instructions   Pacemaker Implantation, Adult Pacemaker implantation is a procedure to place a pacemaker inside your chest. A pacemaker is a small computer that sends electrical signals to the heart and helps your heart beat normally. A pacemaker also stores information about your heart rhythms. You may need pacemaker implantation if you: Have a slow heartbeat (bradycardia). Faint (syncope). Have shortness of breath (dyspnea) due to heart problems.  The pacemaker attaches to your heart through a wire, called a lead. Sometimes just one lead is needed. Other times, there will be two leads. There are two types of pacemakers: Transvenous pacemaker. This type is placed under the skin or muscle of your chest. The lead goes through a vein in the chest area to reach the inside of the  heart. Epicardial pacemaker. This type is placed under the skin or muscle of your chest or belly. The lead goes through your chest to the outside of the heart.  Tell a health care provider about: Any allergies you have. All medicines you are taking, including vitamins, herbs, eye drops, creams, and over-the-counter medicines. Any problems you or family members have had with anesthetic medicines. Any blood or bone disorders you have. Any surgeries you have had. Any medical conditions you have. Whether you are pregnant or may be pregnant. What are the risks? Generally, this is a safe procedure. However, problems may occur, including: Infection. Bleeding. Failure of the pacemaker or the lead. Collapse of a lung or bleeding into a lung. Blood clot inside a blood vessel with a lead. Damage to the heart. Infection inside the heart (endocarditis). Allergic reactions to medicines.  What happens before the procedure? Staying hydrated Follow instructions from your health care provider about hydration, which may include: Up to 2 hours before the procedure - you may continue to drink clear liquids, such as water, clear fruit juice, black coffee, and plain tea.  Eating and drinking restrictions Follow instructions from your health care provider about eating and drinking, which may include: 8 hours before the procedure - stop eating heavy meals or foods such as meat, fried foods, or fatty foods. 6 hours before the procedure - stop eating light meals or foods, such as toast or cereal. 6 hours before the procedure - stop drinking milk or drinks that contain milk. 2 hours before the procedure - stop drinking clear liquids.  Medicines Ask your health care provider about: Changing or stopping your regular medicines. This is especially important if you are taking diabetes medicines or blood thinners. Taking medicines such as aspirin and ibuprofen. These medicines can thin your blood. Do not take  these medicines before your procedure if your health care provider instructs you not to. You may be given antibiotic medicine to help prevent infection. General instructions You will have a heart evaluation. This may include an electrocardiogram (ECG), chest X-ray, and heart imaging (echocardiogram,  or echo) tests. You will have blood tests. Do not use any products that contain nicotine or tobacco, such as cigarettes and e-cigarettes. If you need help quitting, ask your health care provider. Plan to have someone take you home from the hospital or clinic. If you will be going home right after the procedure, plan to have someone with you for 24 hours. Ask your health care provider how your surgical site will be marked or identified. What happens during the procedure? To reduce your risk of infection: Your health care team will wash or sanitize their hands. Your skin will be washed with soap. Hair may be removed from the surgical area. An IV tube will be inserted into one of your veins. You will be given one or more of the following: A medicine to help you relax (sedative). A medicine to numb the area (local anesthetic). A medicine to make you fall asleep (general anesthetic). If you are getting a transvenous pacemaker: An incision will be made in your upper chest. A pocket will be made for the pacemaker. It may be placed under the skin or between layers of muscle. The lead will be inserted into a blood vessel that returns to the heart. While X-rays are taken by an imaging machine (fluoroscopy), the lead will be advanced through the vein to the inside of your heart. The other end of the lead will be tunneled under the skin and attached to the pacemaker. If you are getting an epicardial pacemaker: An incision will be made near your ribs or breastbone (sternum) for the lead. The lead will be attached to the outside of your heart. Another incision will be made in your chest or upper belly to  create a pocket for the pacemaker. The free end of the lead will be tunneled under the skin and attached to the pacemaker. The transvenous or epicardial pacemaker will be tested. Imaging studies may be done to check the lead position. The incisions will be closed with stitches (sutures), adhesive strips, or skin glue. Bandages (dressing) will be placed over the incisions. The procedure may vary among health care providers and hospitals. What happens after the procedure? Your blood pressure, heart rate, breathing rate, and blood oxygen level will be monitored until the medicines you were given have worn off. You will be given antibiotics and pain medicine. ECG and chest x-rays will be done. You will wear a continuous type of ECG (Holter monitor) to check your heart rhythm. Your health care provider will program the pacemaker. Do not drive for 24 hours if you received a sedative. This information is not intended to replace advice given to you by your health care provider. Make sure you discuss any questions you have with your health care provider. Document Released: 10/28/2002 Document Revised: 05/27/2016 Document Reviewed: 04/20/2016 Elsevier Interactive Patient Education  2018 [employer].     Pacemaker Implantation, Adult, Care After This sheet gives you information about how to care for yourself after your procedure.  Your health care provider may also give you more specific instructions. If you have problems or questions, contact your health care provider. What can I expect after the procedure? After the procedure, it is common to have: Mild pain. Slight bruising. Some swelling over the incision. A slight bump over the skin where the device was placed. Sometimes, it is possible to feel the device under the skin. This is normal.  Follow these instructions at home: Medicines Take over-the-counter and prescription medicines only as told by your health care provider. If you were  prescribed an antibiotic medicine, take it as told by your health care provider. Do not stop taking the antibiotic even if you start to feel better. Wound care Do not remove the bandage on your chest until directed to do so by your health care provider. After your bandage is removed, you may see pieces of tape called skin adhesive strips over the area where the cut was made (incision site). Let them fall off on their own. Check the incision site every day to make sure it is not infected, bleeding, or starting to pull apart. Do not use lotions or ointments near the incision site unless directed to do so. Keep the incision area clean and dry for 2-3 days after the procedure or as directed by your health care provider. It takes several weeks for the incision site to completely heal. Do not take baths, swim, or use a hot tub for 7-10 days or as otherwise directed by your health care provider. Activity Do not drive or use heavy machinery while taking prescription pain medicine. Do not drive for 24 hours if you were given a medicine to help you relax (sedative). Check with your health care provider before you start to drive or play sports. Avoid sudden jerking, pulling, or chopping movements that pull your upper arm far away from your body. Avoid these movements for at least 6 weeks or as long as told by your health care provider. Do not lift your upper arm above your shoulders for at least 6 weeks or as long as told by your health care provider. This means no tennis, golf, or swimming. You may go back to work when your health care provider says it is okay. Pacemaker care You may be shown how to transfer data from your pacemaker through the phone to your health care provider. Always let all health care providers know about your pacemaker before you have any medical procedures or tests. Wear a medical ID bracelet or necklace stating that you have a pacemaker. Carry a pacemaker ID card with you at all  times. Your pacemaker battery will last for 5-15 years. Routine checks by your health care provider will let the health care provider know when the battery is starting to run down. The pacemaker will need to be replaced when the battery starts to run down. Do not use amateur Proofreader. Other electrical devices are safe to use, including power tools, lawn mowers, and speakers. If you are unsure of whether something is safe to use, ask your health care provider. When using your cell phone, hold it to the ear opposite the pacemaker. Do not leave your cell phone in a pocket over the pacemaker. Avoid places or objects that have a strong electric or magnetic field, including: Scientist, physiological. When at the airport, let officials know that you have a pacemaker. Power plants. Large electrical generators. Radiofrequency transmission towers, such as cell phone and radio  towers. General instructions Weigh yourself every day. If you suddenly gain weight, fluid may be building up in your body. Keep all follow-up visits as told by your health care provider. This is important. Contact a health care provider if: You gain weight suddenly. Your legs or feet swell. It feels like your heart is fluttering or skipping beats (heart palpitations). You have chills or a fever. You have more redness, swelling, or pain around your incisions. You have more fluid or blood coming from your incisions. Your incisions feel warm to the touch. You have pus or a bad smell coming from your incisions. Get help right away if: You have chest pain. You have trouble breathing or are short of breath. You become extremely tired. You are light-headed or you faint. This information is not intended to replace advice given to you by your health care provider. Make sure you discuss any questions you have with your health care provider. Document Released: 05/27/2005 Document Revised: 08/19/2016  Document Reviewed: 08/19/2016 Elsevier Interactive Patient Education  2018 [employer].    Supplemental Discharge Instructions for  Pacemaker/Defibrillator Patients  ACTIVITY No heavy lifting or vigorous activity with your left/right arm for 6 to 8 weeks.  Do not raise your left/right arm above your head for one week.  Gradually raise your affected arm as drawn below.           __  NO DRIVING for     ; you may begin driving on     .  WOUND CARE Keep the wound area clean and dry.  Do not get this area wet for one week. No showers for one week; you may shower on     . The tape/steri-strips on your wound will fall off; do not pull them off.  No bandage is needed on the site.  DO  NOT apply any creams, oils, or ointments to the wound area. If you notice any drainage or discharge from the wound, any swelling or bruising at the site, or you develop a fever > 101? F after you are discharged home, call the office at once.  SPECIAL INSTRUCTIONS You are still able to use cellular telephones; use the ear opposite the side where you have your pacemaker/defibrillator.  Avoid carrying your cellular phone near your device. When traveling through airports, show security personnel your identification card to avoid being screened in the metal detectors.  Ask the security personnel to use the hand wand. Avoid arc welding equipment, MRI testing (magnetic resonance imaging), TENS units (transcutaneous nerve stimulators).  Call the office for questions about other devices. Avoid electrical appliances that are in poor condition or are not properly grounded. Microwave ovens are safe to be near or to operate.  ADDITIONAL INFORMATION FOR DEFIBRILLATOR PATIENTS SHOULD YOUR DEVICE GO OFF: If your device goes off ONCE and you feel fine afterward, notify the device clinic nurses. If your device goes off ONCE and you do not feel well afterward, call 911. If your device goes off TWICE, call 911. If your device  goes off THREE TIMES IN ONE DAY, call 911.  DO NOT DRIVE YOURSELF OR A FAMILY MEMBER WITH A DEFIBRILLATOR TO THE HOSPITAL--CALL 911.

## 2023-11-29 DIAGNOSIS — M1990 Unspecified osteoarthritis, unspecified site: Secondary | ICD-10-CM | POA: Diagnosis not present

## 2023-11-29 DIAGNOSIS — M0579 Rheumatoid arthritis with rheumatoid factor of multiple sites without organ or systems involvement: Secondary | ICD-10-CM | POA: Diagnosis not present

## 2023-11-29 DIAGNOSIS — M7989 Other specified soft tissue disorders: Secondary | ICD-10-CM | POA: Diagnosis not present

## 2023-11-29 DIAGNOSIS — Z79899 Other long term (current) drug therapy: Secondary | ICD-10-CM | POA: Diagnosis not present

## 2023-11-29 DIAGNOSIS — M79643 Pain in unspecified hand: Secondary | ICD-10-CM | POA: Diagnosis not present

## 2023-11-29 DIAGNOSIS — H04129 Dry eye syndrome of unspecified lacrimal gland: Secondary | ICD-10-CM | POA: Diagnosis not present

## 2023-11-30 ENCOUNTER — Telehealth: Payer: Self-pay | Admitting: Cardiology

## 2023-11-30 NOTE — Telephone Encounter (Signed)
 Dr. Stefano Gaul nurse called in stating Dr. Stefano Gaul would like a call from Dr. Elberta Fortis to discuss upcoming pacemaker procedure and medication.

## 2023-11-30 NOTE — Telephone Encounter (Signed)
 Called Dr. Debria Garret office twice today. Left message both times with direct numbers to call back to discuss. Have not heard back

## 2023-12-04 ENCOUNTER — Telehealth: Payer: Self-pay

## 2023-12-04 ENCOUNTER — Telehealth: Payer: Self-pay | Admitting: Cardiology

## 2023-12-04 NOTE — Telephone Encounter (Signed)
 Spoke with pt. Pts Bp readings have been high (209/75 this morning) as well as low HR (44) along with awful headache for the past 3 days, weakness and general unwell feeling. Told pt to go to ED/call 911 to be seen as I did not want her to want until her procedure tomorrow. Pt stated understanding and that she would go.

## 2023-12-04 NOTE — Telephone Encounter (Signed)
 Called pt to give her new time for PPM - Implant procedure with Dr. Inocencio on 1/14. She will arrive at 1030 for a 1230 procedure time.  She was very happy about this earlier time. She is aware to be NPO after midnight other than she can take her morning meds with a small amount of water.

## 2023-12-04 NOTE — Telephone Encounter (Signed)
 Pt c/o BP issue: STAT if pt c/o blurred vision, one-sided weakness or slurred speech  1. What are your last 5 BP readings?   209/75  HR 44 207/74  HR 44 205/78  HR 44  2. Are you having any other symptoms (ex. Dizziness, headache, blurred vision, passed out)?   Patient stated she has had a terrible headache for the last 3 days.  Weakness and feels off-balance  3. What is your BP issue?   Patient is concerned she is having a procedure tomorrow and has been having high BP readings.

## 2023-12-05 ENCOUNTER — Other Ambulatory Visit: Payer: Self-pay

## 2023-12-05 ENCOUNTER — Ambulatory Visit (HOSPITAL_COMMUNITY)
Admission: RE | Admit: 2023-12-05 | Discharge: 2023-12-05 | Disposition: A | Discharge status: Home / Self Care | Payer: Medicare PPO | Attending: Cardiology | Admitting: Cardiology

## 2023-12-05 ENCOUNTER — Ambulatory Visit (HOSPITAL_COMMUNITY): Payer: Medicare PPO

## 2023-12-05 ENCOUNTER — Encounter (HOSPITAL_COMMUNITY)
Admission: RE | Disposition: A | Discharge status: Home / Self Care | Payer: Self-pay | Source: Home / Self Care | Attending: Cardiology

## 2023-12-05 DIAGNOSIS — I442 Atrioventricular block, complete: Secondary | ICD-10-CM | POA: Insufficient documentation

## 2023-12-05 DIAGNOSIS — I2510 Atherosclerotic heart disease of native coronary artery without angina pectoris: Secondary | ICD-10-CM | POA: Diagnosis not present

## 2023-12-05 DIAGNOSIS — J90 Pleural effusion, not elsewhere classified: Secondary | ICD-10-CM | POA: Diagnosis not present

## 2023-12-05 DIAGNOSIS — Z950 Presence of cardiac pacemaker: Secondary | ICD-10-CM | POA: Diagnosis not present

## 2023-12-05 HISTORY — PX: PACEMAKER IMPLANT: EP1218

## 2023-12-05 SURGERY — PACEMAKER IMPLANT

## 2023-12-05 MED ORDER — MIDAZOLAM HCL 5 MG/5ML IJ SOLN
INTRAMUSCULAR | Status: DC | PRN
  Administered 2023-12-05: 1 mg via INTRAVENOUS

## 2023-12-05 MED ORDER — CHLORHEXIDINE GLUCONATE 4 % EX SOLN
4.0000 | Freq: Once | CUTANEOUS | Status: AC
  Administered 2023-12-05: 4 via TOPICAL
  Filled 2023-12-05: qty 60

## 2023-12-05 MED ORDER — MIDAZOLAM HCL 5 MG/5ML IJ SOLN
INTRAMUSCULAR | Status: AC
  Filled 2023-12-05: qty 5

## 2023-12-05 MED ORDER — FENTANYL CITRATE (PF) 100 MCG/2ML IJ SOLN
INTRAMUSCULAR | Status: DC | PRN
  Administered 2023-12-05: 25 ug via INTRAVENOUS

## 2023-12-05 MED ORDER — GENTAMICIN SULFATE 40 MG/ML IJ SOLN
80.0000 mg | INTRAMUSCULAR | Status: AC
  Administered 2023-12-05: 80 mg

## 2023-12-05 MED ORDER — LIDOCAINE HCL (PF) 1 % IJ SOLN
INTRAMUSCULAR | Status: DC | PRN
  Administered 2023-12-05: 60 mL

## 2023-12-05 MED ORDER — ONDANSETRON HCL 4 MG/2ML IJ SOLN
INTRAMUSCULAR | Status: AC
  Filled 2023-12-05: qty 2

## 2023-12-05 MED ORDER — FENTANYL CITRATE (PF) 100 MCG/2ML IJ SOLN
INTRAMUSCULAR | Status: AC
  Filled 2023-12-05: qty 2

## 2023-12-05 MED ORDER — SODIUM CHLORIDE 0.9 % IV SOLN
INTRAVENOUS | Status: DC
Start: 2023-12-05 — End: 2023-12-06

## 2023-12-05 MED ORDER — VANCOMYCIN HCL IN DEXTROSE 1-5 GM/200ML-% IV SOLN: 1000.0000 mg | Freq: Two times a day (BID) | INTRAVENOUS | Status: DC

## 2023-12-05 MED ORDER — ONDANSETRON HCL 4 MG/2ML IJ SOLN
INTRAMUSCULAR | Status: DC | PRN
  Administered 2023-12-05: 4 mg via INTRAVENOUS
  Administered 2023-12-05: 4 mg

## 2023-12-05 MED ORDER — LIDOCAINE HCL 1 % IJ SOLN
INTRAMUSCULAR | Status: AC
  Filled 2023-12-05: qty 60

## 2023-12-05 MED ORDER — ONDANSETRON HCL 4 MG/2ML IJ SOLN
4.0000 mg | Freq: Four times a day (QID) | INTRAMUSCULAR | Status: DC | PRN
  Filled 2023-12-05: qty 2

## 2023-12-05 MED ORDER — VANCOMYCIN HCL IN DEXTROSE 1-5 GM/200ML-% IV SOLN
INTRAVENOUS | Status: AC
  Filled 2023-12-05: qty 200

## 2023-12-05 MED ORDER — SODIUM CHLORIDE 0.9 % IV SOLN
INTRAVENOUS | Status: AC
  Filled 2023-12-05: qty 2

## 2023-12-05 MED ORDER — HEPARIN (PORCINE) IN NACL 1000-0.9 UT/500ML-% IV SOLN
INTRAVENOUS | Status: DC | PRN
  Administered 2023-12-05: 500 mL

## 2023-12-05 MED ORDER — HYDRALAZINE HCL 20 MG/ML IJ SOLN
10.0000 mg | Freq: Once | INTRAMUSCULAR | Status: AC
  Administered 2023-12-05: 10 mg via INTRAVENOUS
  Filled 2023-12-05: qty 1

## 2023-12-05 MED ORDER — ACETAMINOPHEN 325 MG PO TABS
325.0000 mg | ORAL_TABLET | ORAL | Status: DC | PRN
  Administered 2023-12-05: 650 mg via ORAL
  Filled 2023-12-05: qty 2

## 2023-12-05 MED ORDER — VANCOMYCIN HCL IN DEXTROSE 1-5 GM/200ML-% IV SOLN
1000.0000 mg | INTRAVENOUS | Status: AC
  Administered 2023-12-05: 1000 mg via INTRAVENOUS

## 2023-12-05 SURGICAL SUPPLY — 13 items
CABLE SURGICAL S-101-97-12 (CABLE) ×1 IMPLANT
CATH RIGHTSITE C315HIS02 (CATHETERS) IMPLANT
IPG PACE AZUR XT DR MRI W1DR01 (Pacemaker) IMPLANT
LEAD CAPSURE NOVUS 5076-52CM (Lead) IMPLANT
LEAD SELECT SECURE 3830 383069 (Lead) IMPLANT
PACE AZURE XT DR MRI W1DR01 (Pacemaker) ×1 IMPLANT
PAD DEFIB RADIO PHYSIO CONN (PAD) ×1 IMPLANT
SELECT SECURE 3830 383069 (Lead) ×1 IMPLANT
SHEATH 7FR PRELUDE SNAP 13 (SHEATH) IMPLANT
SHEATH PROBE COVER 6X72 (BAG) IMPLANT
SLITTER 6232ADJ (MISCELLANEOUS) IMPLANT
TRAY PACEMAKER INSERTION (PACKS) ×1 IMPLANT
WIRE HI TORQ VERSACORE-J 145CM (WIRE) IMPLANT

## 2023-12-05 NOTE — Interval H&P Note (Signed)
 History and Physical Interval Note:  12/05/2023 11:32 AM  Samantha Miranda  has presented today for surgery, with the diagnosis of chb.  The various methods of treatment have been discussed with the patient and family. After consideration of risks, benefits and other options for treatment, the patient has consented to  Procedure(s): PACEMAKER IMPLANT (N/A) as a surgical intervention.  The patient's history has been reviewed, patient examined, no change in status, stable for surgery.  I have reviewed the patient's chart and labs.  Questions were answered to the patient's satisfaction.     Gay Moncivais [employer]

## 2023-12-05 NOTE — Discharge Instructions (Signed)
 After Your Pacemaker   You have a [employer]onic Pacemaker  ACTIVITY Do not lift your arm above shoulder height for 1 week after your procedure. After 7 days, you may progress as below.  You should remove your sling 24 hours after your procedure, unless otherwise instructed by your provider.     Tuesday December 12, 2023  Wednesday December 13, 2023 Thursday December 14, 2023 Friday December 15, 2023   Do not lift, push, pull, or carry anything over 10 pounds with the affected arm until 6 weeks (Tuesday January 16, 2024 ) after your procedure.   You may drive AFTER your wound check, unless you have been told otherwise by your provider.   Ask your healthcare provider when you can go back to work   INCISION/Dressing If you are on a blood thinner such as Coumadin, Xarelto, Eliquis, Plavix, or Pradaxa please confirm with your provider when this should be resumed.   If large square, outer bandage is left in place, this can be removed after 24 hours from your procedure. Do not remove steri-strips or glue as below.   If a PRESSURE DRESSING (a bulky dressing that usually goes up over your shoulder) was applied or left in place, please follow instructions given by your provider on when to return to have this removed.   Monitor your Pacemaker site for redness, swelling, and drainage. Call the device clinic at [phone] if you experience these symptoms or fever/chills.  If your incision is sealed with Steri-strips or staples, you may shower 7 days after your procedure or when told by your provider. Do not remove the steri-strips or let the shower hit directly on your site. You may wash around your site with soap and water.    If you were discharged in a sling, please do not wear this during the day more than 48 hours after your surgery unless otherwise instructed. This may increase the risk of stiffness and soreness in your shoulder.   Avoid lotions, ointments, or perfumes over your incision until it  is well-healed.  You may use a hot tub or a pool AFTER your wound check appointment if the incision is completely closed.  Pacemaker Alerts:  Some alerts are vibratory and others beep. These are NOT emergencies. Please call our office to let us  know. If this occurs at night or on weekends, it can wait until the next business day. Send a remote transmission.  If your device is capable of reading fluid status (for heart failure), you will be offered monthly monitoring to review this with you.   DEVICE MANAGEMENT Remote monitoring is used to monitor your pacemaker from home. This monitoring is scheduled every 91 days by our office. It allows us  to keep an eye on the functioning of your device to ensure it is working properly. You will routinely see your Electrophysiologist annually (more often if necessary).   You should receive your ID card for your new device in 4-8 weeks. Keep this card with you at all times once received. Consider wearing a medical alert bracelet or necklace.  Your Pacemaker may be MRI compatible. This will be discussed at your next office visit/wound check.  You should avoid contact with strong electric or magnetic fields.   Do not use amateur (ham) radio equipment or electric (arc) welding torches. MP3 player headphones with magnets should not be used. Some devices are safe to use if held at least 12 inches (30 cm) from your Pacemaker. These include power tools, lawn  mowers, and speakers. If you are unsure if something is safe to use, ask your health care provider.  When using your cell phone, hold it to the ear that is on the opposite side from the Pacemaker. Do not leave your cell phone in a pocket over the Pacemaker.  You may safely use electric blankets, heating pads, computers, and microwave ovens.  Call the office right away if: You have chest pain. You feel more short of breath than you have felt before. You feel more light-headed than you have felt before. Your  incision starts to open up.  This information is not intended to replace advice given to you by your health care provider. Make sure you discuss any questions you have with your health care provider.

## 2023-12-06 ENCOUNTER — Encounter (HOSPITAL_COMMUNITY): Payer: Self-pay | Admitting: Cardiology

## 2023-12-07 ENCOUNTER — Telehealth: Payer: Self-pay | Admitting: Cardiology

## 2023-12-07 NOTE — Telephone Encounter (Signed)
Pt c/o BP issue: STAT if pt c/o blurred vision, one-sided weakness or slurred speech  1. What are your last 5 BP readings?  168/86 68  2. Are you having any other symptoms (ex. Dizziness, headache, blurred vision, passed out)?  Patient mentions that she gained about 5 lbs which is abnormal because she hasn't eaten much in 3 days. She reports "a little" edema in ankles, but states it's not bad  3. What is your BP issue?  Patient states BP has been elevated.

## 2023-12-07 NOTE — Telephone Encounter (Signed)
Spoke with pt who reports elevated BP today as below.  Pt denies CP, SOB, dizziness or any additional symptoms.  She also reports her last office visit with Dr Elberta Fortis she weighed 121 and today she weighs 127.  Pt denies lower extremity or abdominal edema.  She states she may have a minimal amount of swelling in her ankles but she has been sitting a lot.  She does not take a diuretic or BP medication. Encouraged pt to continue to monitor her BP daily x 5 days at the same time each day with both feet on the floor and after resting for about 10 minutes.  Requested she send those readings to Dr Elberta Fortis as well as her PCP. Pt advised her weight at hospital admission on 12/05/23 was also 127.  Pt should try to elevate feet and legs when sitting.  Pt verbalizes understanding and agrees with current plan.

## 2023-12-12 ENCOUNTER — Encounter: Payer: Self-pay | Admitting: Cardiology

## 2023-12-19 ENCOUNTER — Other Ambulatory Visit: Payer: Self-pay | Admitting: Family Medicine

## 2023-12-19 DIAGNOSIS — R053 Chronic cough: Secondary | ICD-10-CM

## 2023-12-21 ENCOUNTER — Ambulatory Visit: Payer: Medicare PPO | Attending: Cardiology

## 2023-12-21 DIAGNOSIS — I442 Atrioventricular block, complete: Secondary | ICD-10-CM | POA: Diagnosis not present

## 2023-12-21 LAB — CUP PACEART INCLINIC DEVICE CHECK
Date Time Interrogation Session: 20250130131313
Implantable Lead Connection Status: 753985
Implantable Lead Connection Status: 753985
Implantable Lead Implant Date: 20250114
Implantable Lead Implant Date: 20250114
Implantable Lead Location: 753859
Implantable Lead Location: 753860
Implantable Lead Model: 3830
Implantable Lead Model: 5076
Implantable Pulse Generator Implant Date: 20250114

## 2023-12-21 NOTE — Patient Instructions (Signed)
After Your Pacemaker   Monitor your pacemaker site for redness, swelling, and drainage. Call the device clinic at [phone] if you experience these symptoms or fever/chills.  Your incision was closed with Steri-strips or staples:  You may shower 7 days after your procedure and wash your incision with soap and water. Avoid lotions, ointments, or perfumes over your incision until it is well-healed.  You may use a hot tub or a pool after your wound check appointment if the incision is completely closed.  Do not lift, push or pull greater than 10 pounds with the affected arm until 6 weeks after your procedure. There are no other restrictions in arm movement after your wound check appointment.  You may drive, unless driving has been restricted by your healthcare providers.  Remote monitoring is used to monitor your pacemaker from home. This monitoring is scheduled every 91 days by our office. It allows [place] to keep an eye on the functioning of your device to ensure it is working properly. You will routinely see your Electrophysiologist annually (more often if necessary).

## 2023-12-21 NOTE — Progress Notes (Signed)
Normal Pacemaker wound check. Wound well healed. Thresholds, sensing, and impedances consistent with implant measurements and at 3.5V safety margin/auto capture until 3 month visit. No episodes. Reviewed arm restrictions to continue for 6 weeks total post op.  Pt enrolled in remote follow-up.

## 2023-12-22 ENCOUNTER — Encounter: Payer: Self-pay | Admitting: Family Medicine

## 2023-12-22 ENCOUNTER — Ambulatory Visit: Payer: Medicare PPO | Admitting: Family Medicine

## 2023-12-22 VITALS — BP 160/84 | HR 84 | Temp 97.9°F | Resp 18 | Ht 63.0 in | Wt 118.4 lb

## 2023-12-22 DIAGNOSIS — I10 Essential (primary) hypertension: Secondary | ICD-10-CM | POA: Diagnosis not present

## 2023-12-22 MED ORDER — LOSARTAN POTASSIUM 50 MG PO TABS: 50.0000 mg | ORAL_TABLET | Freq: Every day | ORAL | 1 refills | Status: DC

## 2023-12-22 NOTE — Progress Notes (Signed)
Established Patient Office Visit  Subjective   Patient ID: Samantha Miranda, female    DOB: 07-Aug-1946  Age: 78 y.o. MRN: [id number]  Chief Complaint  Patient presents with   Hypertension    Pt states blood pressures have been running high. Pt states getting pacemaker 1/14.    HPI Discussed the use of AI scribe software for clinical note transcription with the patient, who gave verbal consent to proceed.  History of Present Illness   The patient presents with high blood pressure and recent pacemaker placement.  She has been experiencing high blood pressure recently, which is unusual for her as she has never had high blood pressure before. A home health nurse recorded her blood pressure as 130 mmHg in early November, but it has since increased significantly. She monitors it daily, and her husband noted that her pulse was low while her blood pressure was high.  She experienced shortness of breath and fatigue, leading her to visit the cancer center on January 8th, where her blood pressure was found to be extremely high. Two EKGs revealed a complete heart block, and she was transferred to the trauma center at [facility]. Her pulse was increased from 39 to 60 bpm before discharge. She recently had a pacemaker placed and had the bandage removed the day before this visit. The pacemaker is functioning well according to her doctors.  She has a history of rheumatoid arthritis and has tried various treatments, ultimately finding relief with hydroxychloroquine. She also mentions having issues with statins, being unable to tolerate them except for one non-statin medication.      Patient Active Problem List   Diagnosis Date Noted   Rheumatoid arthritis involving multiple sites with positive rheumatoid factor (HCC) 05/15/2023   Hemorrhoids 05/15/2023   Neck nodule 05/15/2023   COVID-19 06/20/2022   Acute pain of both shoulders 04/26/2022   Bunion, right 02/07/2022   DOE (dyspnea on exertion)  12/20/2021   Pulmonary emphysema, unspecified emphysema type (HCC) 12/20/2021   Atherosclerosis of aorta (HCC) 12/20/2021   Gastroenteritis 08/23/2021   Preventative health care 07/22/2021   Insomnia 07/22/2021   History of peptic ulcer 07/22/2021   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 (HCC) 06/23/2021   Depression, major, single episode, 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   Influenza vaccine administered 11/02/2015   Otitis media 08/10/2015   Acute bronchitis 04/21/2015   Right ankle pain 02/10/2014   Left shoulder pain 11/22/2013   Right shoulder pain 08/20/2013   Near syncope 08/12/2013   Other malaise and fatigue 08/12/2013   Rectal itching 07/01/2013   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   Gastroesophageal reflux disease 08/12/2012   Former heavy tobacco smoker 08/26/2010   History of colonic polyps 08/26/2010   POSTMENOPAUSAL STATUS 08/26/2010   POSTMENOPAUSAL BLEEDING 04/13/2009   Hyperlipidemia 07/11/2007   DEPRESSION 07/11/2007   OSTEOPENIA 07/11/2007   BREAST CANCER, HX OF 07/11/2007   Past Medical History:  Diagnosis Date   Barrett's esophagus    Basal cell carcinoma of left lower leg    Breast cancer (HCC)    right   COPD (chronic obstructive pulmonary disease) (HCC) 2018   Depression 1989   Gastric ulcer    GERD (gastroesophageal reflux disease)    History of colon polyps    Hyperlipidemia    Osteopenia    Past Surgical History:  Procedure Laterality Date   ABDOMINAL HYSTERECTOMY  11/21/1989   APPENDECTOMY  1969   BREAST LUMPECTOMY  07/22/2006   BREAST  LUMPECTOMY WITH RADIOACTIVE SEED AND SENTINEL LYMPH NODE BIOPSY Right 07/02/2021   Procedure: RIGHT BREAST LUMPECTOMY WITH RADIOACTIVE SEED AND RIGHT AXILLARY SENTINEL LYMPH NODE BIOPSY;  Surgeon: Emelia Loron, MD;  Location: [facility] OR;  Service: General;  Laterality: Right;   CATARACT EXTRACTION, BILATERAL     EYE SURGERY  08/2013  10/2013   cataracts   PACEMAKER IMPLANT N/A 12/05/2023    Procedure: PACEMAKER IMPLANT;  Surgeon: Regan Lemming, MD;  Location: [facility] INVASIVE CV LAB;  Service: Cardiovascular;  Laterality: N/A;   TONSILLECTOMY     Social History   Tobacco Use   Smoking status: Former    Current packs/day: 0.00    Average packs/day: 0.5 packs/day for 40.0 years (20.0 ttl pk-yrs)    Types: Cigarettes, E-cigarettes    Start date: 07/23/1971    Quit date: 07/23/2011    Years since quitting: 12.4   Smokeless tobacco: Never   Tobacco comments:    The last 5 yrs. I smoked .5 pack a day. Before that, 1 pk. a day  Vaping Use   Vaping status: Former   Substances: Nicotine  Substance Use Topics   Alcohol use: Yes    Comment: occasional wine   Drug use: No   Social History   Socioeconomic History   Marital status: Married    Spouse name: Not on file   Number of children: 1   Years of education: Not on file   Highest education level: Associate degree: occupational, Scientist, product/process development, or vocational program  Occupational History   Occupation: retired    Associate Professor: [employer]    Comment: retired  Tobacco Use   Smoking status: Former    Current packs/day: 0.00    Average packs/day: 0.5 packs/day for 40.0 years (20.0 ttl pk-yrs)    Types: Cigarettes, E-cigarettes    Start date: 07/23/1971    Quit date: 07/23/2011    Years since quitting: 12.4   Smokeless tobacco: Never   Tobacco comments:    The last 5 yrs. I smoked .5 pack a day. Before that, 1 pk. a day  Vaping Use   Vaping status: Former   Substances: Nicotine  Substance and Sexual Activity   Alcohol use: Yes    Comment: occasional wine   Drug use: No   Sexual activity: Not Currently    Partners: Male    Birth control/protection: Post-menopausal  Other Topics Concern   Not on file  Social History Narrative   Exercise-virtual class, MWF   Social Drivers of Corporate investment banker Strain: Low Risk  (12/22/2023)   Overall Financial Resource Strain (CARDIA)    Difficulty of Paying Living  Expenses: Not hard at all  Food Insecurity: No Food Insecurity (12/22/2023)   Hunger Vital Sign    Worried About Running Out of Food in the Last Year: Never true    Ran Out of Food in the Last Year: Never true  Transportation Needs: No Transportation Needs (12/22/2023)   PRAPARE - Administrator, Civil Service (Medical): No    Lack of Transportation (Non-Medical): No  Physical Activity: Sufficiently Active (12/22/2023)   Exercise Vital Sign    Days of Exercise per Week: 3 days    Minutes of Exercise per Session: 80 min  Stress: No Stress Concern Present (12/22/2023)   [employer] of Occupational Health - Occupational Stress Questionnaire    Feeling of Stress : Only a little  Social Connections: Moderately Isolated (12/22/2023)   Social Connection and  Isolation Panel [NHANES]    Frequency of Communication with Friends and Family: More than three times a week    Frequency of Social Gatherings with Friends and Family: Patient declined    Attends Religious Services: Never    Database administrator or Organizations: No    Attends Banker Meetings: Never    Marital Status: Married  Catering manager Violence: Not At Risk (09/14/2023)   Humiliation, Afraid, Rape, and Kick questionnaire    Fear of Current or Ex-Partner: No    Emotionally Abused: No    Physically Abused: No    Sexually Abused: No   Family Status  Relation Name Status   Mother Dot Duck Deceased at age 34   Father Rayna Sexton Long Deceased   Sister Bennye Alm Deceased at age 62       breast ca   Mat Aunt Colin Benton (Not Specified)   MGM Britt Bottom (Not Specified)   MGF Dian Queen (Not Specified)   PGF Grace Bushy (Not Specified)   Daughter Jarrett Soho (Not Specified)   Neg Hx  (Not Specified)  No partnership data on file   Family History  Problem Relation Age of Onset   Heart failure Mother        CHF   Hyperlipidemia Mother    Aortic stenosis Mother    Arthritis Mother     Heart disease Mother    Lung cancer Father 81   Cancer Father    Early death Father    Breast cancer Sister 79   Cancer Sister 64   Depression Sister    Stroke Maternal Aunt    Varicose Veins Maternal Aunt    Diabetes Maternal Grandmother    Hyperlipidemia Maternal Grandmother    Colon cancer Maternal Grandfather    Cancer Maternal Grandfather        colon cancer   Cancer Paternal Grandfather        unknown type cancer   Drug abuse Daughter    Heart attack Neg Hx    Hypertension Neg Hx    Sudden death Neg Hx    Esophageal cancer Neg Hx    Stomach cancer Neg Hx    Rectal cancer Neg Hx    Allergies  Allergen Reactions   Statins Other (See Comments)    Myalgia    Augmentin [Amoxicillin-Pot Clavulanate] Nausea And Vomiting   Moxifloxacin Nausea And Vomiting      Review of Systems  Constitutional:  Negative for fever and malaise/fatigue.  HENT:  Negative for congestion.   Eyes:  Negative for blurred vision.  Respiratory:  Negative for cough and shortness of breath.   Cardiovascular:  Negative for chest pain, palpitations and leg swelling.  Gastrointestinal:  Negative for abdominal pain, blood in stool, nausea and vomiting.  Genitourinary:  Negative for dysuria and frequency.  Musculoskeletal:  Negative for back pain and falls.  Skin:  Negative for rash.  Neurological:  Negative for dizziness, loss of consciousness and headaches.  Endo/Heme/Allergies:  Negative for environmental allergies.  Psychiatric/Behavioral:  Negative for depression. The patient is not nervous/anxious.       Objective:     BP (!) 160/84 (BP Location: Left Arm, Patient Position: Sitting, Cuff Size: Normal)   Pulse 84   Temp 97.9 F (36.6 C) (Oral)   Resp 18   Ht 5\' 3"  (1.6 m)   Wt 118 lb 6.4 oz (53.7 kg)   SpO2 98%   BMI 20.97 kg/m  BP Readings from  Last 3 Encounters:  12/22/23 (!) 160/84  12/05/23 (!) 155/67  11/17/23 (!) 150/80   Wt Readings from Last 3 Encounters:  12/22/23 118  lb 6.4 oz (53.7 kg)  12/05/23 127 lb (57.6 kg)  11/17/23 121 lb 9.6 oz (55.2 kg)   SpO2 Readings from Last 3 Encounters:  12/22/23 98%  12/05/23 91%  11/17/23 97%      Physical Exam Vitals and nursing note reviewed.  Constitutional:      General: She is not in acute distress.    Appearance: Normal appearance. She is well-developed.  HENT:     Head: Normocephalic and atraumatic.  Eyes:     General: No scleral icterus.       Right eye: No discharge.        Left eye: No discharge.  Cardiovascular:     Rate and Rhythm: Normal rate and regular rhythm.     Heart sounds: No murmur heard. Pulmonary:     Effort: Pulmonary effort is normal. No respiratory distress.     Breath sounds: Normal breath sounds.  Musculoskeletal:        General: Normal range of motion.     Cervical back: Normal range of motion and neck supple.     Right lower leg: No edema.     Left lower leg: No edema.  Skin:    General: Skin is warm and dry.  Neurological:     Mental Status: She is alert and oriented to person, place, and time.  Psychiatric:        Mood and Affect: Mood normal.        Behavior: Behavior normal.        Thought Content: Thought content normal.        Judgment: Judgment normal.      No results found for any visits on 12/22/23.  Last CBC Lab Results  Component Value Date   WBC 6.0 11/06/2023   HGB 13.7 11/06/2023   HCT 40.4 11/06/2023   MCV 91.6 11/06/2023   MCH 31.1 11/06/2023   RDW 13.2 11/06/2023   PLT 232 11/06/2023   Last metabolic panel Lab Results  Component Value Date   GLUCOSE 94 11/06/2023   NA 135 11/06/2023   K 3.7 11/06/2023   CL 103 11/06/2023   CO2 24 11/06/2023   BUN 12 11/06/2023   CREATININE 0.93 11/06/2023   GFRNONAA >60 11/06/2023   CALCIUM 9.1 11/06/2023   PROT 7.3 11/06/2023   ALBUMIN 4.0 11/06/2023   BILITOT 0.5 11/06/2023   ALKPHOS 51 11/06/2023   AST 28 11/06/2023   ALT 16 11/06/2023   ANIONGAP 8 11/06/2023   Last lipids Lab  Results  Component Value Date   CHOL 241 (H) 01/31/2023   HDL 82.60 01/31/2023   LDLCALC 144 (H) 01/31/2023   LDLDIRECT 176.2 08/12/2013   TRIG 68.0 01/31/2023   CHOLHDL 3 01/31/2023   Last hemoglobin A1c Lab Results  Component Value Date   HGBA1C 5.1 08/04/2017   Last thyroid functions Lab Results  Component Value Date   TSH 1.31 05/15/2023   T4TOTAL 7.6 05/15/2023   Last vitamin D Lab Results  Component Value Date   VD25OH 87.21 08/02/2022   Last vitamin B12 and Folate No results found for: "VITAMINB12", "FOLATE"    The 10-year ASCVD risk score (Arnett DK, et al., 2019) is: 34.9%    Assessment & Plan:   Problem List Items Addressed This Visit   None Visit Diagnoses  Primary hypertension    -  Primary   Relevant Medications   losartan (COZAAR) 50 MG tablet     Assessment and Plan    Hypertension Recent onset of elevated blood pressure over the past 15 days, accompanied by dyspnea and fatigue. Blood pressure was elevated during a visit to the cancer center. Start losartan 50 mg, 30 pills. Recheck blood pressure in 2-3 weeks and report any medication issues.  Complete Heart Block Diagnosed with complete heart block after presenting with severe bradycardia (pulse of 39) and hypertension. A pacemaker was placed, stabilizing the pulse to 60 bpm. Follow up with the cardiology nurse practitioner on April 17.  Rheumatoid Arthritis Managed with hydroxychloroquine. Reports intolerance to statins and other medications. Continue hydroxychloroquine.  General Health Maintenance A follow-up appointment with the oncologist is scheduled for May 8 for cancer management.        Return in about 3 weeks (around 01/12/2024), or if symptoms worsen or fail to improve, for bp.    Donato Schultz, DO

## 2023-12-22 NOTE — Patient Instructions (Signed)

## 2023-12-27 ENCOUNTER — Other Ambulatory Visit: Payer: Self-pay | Admitting: Family Medicine

## 2023-12-27 DIAGNOSIS — E785 Hyperlipidemia, unspecified: Secondary | ICD-10-CM

## 2024-01-12 ENCOUNTER — Ambulatory Visit: Payer: Medicare PPO | Admitting: Family Medicine

## 2024-01-15 ENCOUNTER — Ambulatory Visit: Payer: Medicare PPO | Admitting: Family Medicine

## 2024-01-15 ENCOUNTER — Encounter: Payer: Self-pay | Admitting: Family Medicine

## 2024-01-15 VITALS — BP 140/78 | HR 88 | Temp 98.8°F | Resp 16 | Ht 63.0 in | Wt 119.2 lb

## 2024-01-15 DIAGNOSIS — I10 Essential (primary) hypertension: Secondary | ICD-10-CM

## 2024-01-15 DIAGNOSIS — E785 Hyperlipidemia, unspecified: Secondary | ICD-10-CM | POA: Diagnosis not present

## 2024-01-15 LAB — COMPREHENSIVE METABOLIC PANEL
ALT: 12 U/L (ref 0–35)
AST: 21 U/L (ref 0–37)
Albumin: 4.3 g/dL (ref 3.5–5.2)
Alkaline Phosphatase: 46 U/L (ref 39–117)
BUN: 22 mg/dL (ref 6–23)
CO2: 28 meq/L (ref 19–32)
Calcium: 9.5 mg/dL (ref 8.4–10.5)
Chloride: 102 meq/L (ref 96–112)
Creatinine, Ser: 0.97 mg/dL (ref 0.40–1.20)
GFR: 56.62 mL/min — ABNORMAL LOW (ref >60.00)
Glucose, Bld: 82 mg/dL (ref 70–99)
Potassium: 4.2 meq/L (ref 3.5–5.1)
Sodium: 139 meq/L (ref 135–145)
Total Bilirubin: 0.4 mg/dL (ref 0.2–1.2)
Total Protein: 7.1 g/dL (ref 6.0–8.3)

## 2024-01-15 LAB — LIPID PANEL
Cholesterol: 193 mg/dL (ref 0–200)
HDL: 63.5 mg/dL (ref >39.00)
LDL Cholesterol: 113 mg/dL — ABNORMAL HIGH (ref 0–99)
NonHDL: 129.2
Total CHOL/HDL Ratio: 3
Triglycerides: 83 mg/dL (ref 0.0–149.0)
VLDL: 16.6 mg/dL (ref 0.0–40.0)

## 2024-01-15 MED ORDER — LOSARTAN POTASSIUM 100 MG PO TABS: 100.0000 mg | ORAL_TABLET | Freq: Every day | ORAL | 1 refills | Status: DC

## 2024-01-15 NOTE — Patient Instructions (Signed)

## 2024-01-15 NOTE — Progress Notes (Signed)
 Established Patient Office Visit  Subjective   Patient ID: Samantha Miranda, female    DOB: [birth year]  Age: 78 y.o. MRN: [id number]  Chief Complaint  Patient presents with   Hypertension   Follow-up    HPI Discussed the use of AI scribe software for clinical note transcription with the patient, who gave verbal consent to proceed.  History of Present Illness   Samantha Miranda is a 78 year old female with hypertension who presents for blood pressure management.  She has been experiencing elevated systolic blood pressure readings in the range of 150-160 mmHg. She is currently taking losartan and has not experienced any side effects. She prefers her blood pressure to be around 130 mmHg. No swelling in the legs.  She inquires about the timing of her medication intake, mentioning that she initially took her blood pressure medication in the morning but switched to nighttime dosing after reading it might be beneficial.  She does not have a cardiologist. She is scheduled for a wound check with a nurse practitioner in April and plans to have her blood pressure re-evaluated at that time.      Patient Active Problem List   Diagnosis Date Noted   Primary hypertension 12/22/2023   Rheumatoid arthritis involving multiple sites with positive rheumatoid factor (HCC) 05/15/2023   Hemorrhoids 05/15/2023   Neck nodule 05/15/2023   COVID-19 06/20/2022   Acute pain of both shoulders 04/26/2022   Bunion, right 02/07/2022   DOE (dyspnea on exertion) 12/20/2021   Pulmonary emphysema, unspecified emphysema type (HCC) 12/20/2021   Atherosclerosis of aorta (HCC) 12/20/2021   Gastroenteritis 08/23/2021   Preventative health care 07/22/2021   Insomnia 07/22/2021   History of peptic ulcer 07/22/2021   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 (HCC) 06/23/2021   Depression, major, single episode, 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   Influenza vaccine administered 11/02/2015   Otitis media  08/10/2015   Acute bronchitis 04/21/2015   Right ankle pain 02/10/2014   Left shoulder pain 11/22/2013   Right shoulder pain 08/20/2013   Near syncope 08/12/2013   Other malaise and fatigue 08/12/2013   Rectal itching 07/01/2013   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   Gastroesophageal reflux disease 08/12/2012   Former heavy tobacco smoker 08/26/2010   History of colonic polyps 08/26/2010   POSTMENOPAUSAL STATUS 08/26/2010   POSTMENOPAUSAL BLEEDING 04/13/2009   Hyperlipidemia 07/11/2007   DEPRESSION 07/11/2007   OSTEOPENIA 07/11/2007   BREAST CANCER, HX OF 07/11/2007   Past Medical History:  Diagnosis Date   Barrett's esophagus    Basal cell carcinoma of left lower leg    Breast cancer (HCC)    right   COPD (chronic obstructive pulmonary disease) (HCC) 2018   Depression 1989   Gastric ulcer    GERD (gastroesophageal reflux disease)    History of colon polyps    Hyperlipidemia    Osteopenia    Past Surgical History:  Procedure Laterality Date   ABDOMINAL HYSTERECTOMY  11/21/1989   APPENDECTOMY  1969   BREAST LUMPECTOMY  07/22/2006   BREAST LUMPECTOMY WITH RADIOACTIVE SEED AND SENTINEL LYMPH NODE BIOPSY Right 07/02/2021   Procedure: RIGHT BREAST LUMPECTOMY WITH RADIOACTIVE SEED AND RIGHT AXILLARY SENTINEL LYMPH NODE BIOPSY;  Surgeon: Emelia Loron, MD;  Location: [facility] OR;  Service: General;  Laterality: Right;   CATARACT EXTRACTION, BILATERAL     EYE SURGERY  08/2013  10/2013   cataracts   PACEMAKER IMPLANT N/A 12/05/2023   Procedure: PACEMAKER IMPLANT;  Surgeon: Elberta Fortis,  Andree Coss, MD;  Location: [facility];  Service: Cardiovascular;  Laterality: N/A;   TONSILLECTOMY     Social History   Tobacco Use   Smoking status: Former    Current packs/day: 0.00    Average packs/day: 0.5 packs/day for 40.0 years (20.0 ttl pk-yrs)    Types: Cigarettes, E-cigarettes    Start date: 07/23/1971    Quit date: 07/23/2011    Years since quitting: 12.4   Smokeless tobacco: Never    Tobacco comments:    The last 5 yrs. I smoked .5 pack a day. Before that, 1 pk. a day  Vaping Use   Vaping status: Former   Substances: Nicotine  Substance Use Topics   Alcohol use: Yes    Comment: occasional wine   Drug use: No   Social History   Socioeconomic History   Marital status: Married    Spouse name: Not on file   Number of children: 1   Years of education: Not on file   Highest education level: Associate degree: occupational, Scientist, product/process development, or vocational program  Occupational History   Occupation: retired    Associate Professor: [employer]    Comment: retired  Tobacco Use   Smoking status: Former    Current packs/day: 0.00    Average packs/day: 0.5 packs/day for 40.0 years (20.0 ttl pk-yrs)    Types: Cigarettes, E-cigarettes    Start date: 07/23/1971    Quit date: 07/23/2011    Years since quitting: 12.4   Smokeless tobacco: Never   Tobacco comments:    The last 5 yrs. I smoked .5 pack a day. Before that, 1 pk. a day  Vaping Use   Vaping status: Former   Substances: Nicotine  Substance and Sexual Activity   Alcohol use: Yes    Comment: occasional wine   Drug use: No   Sexual activity: Not Currently    Partners: Male    Birth control/protection: Post-menopausal  Other Topics Concern   Not on file  Social History Narrative   Exercise-virtual class, MWF   Social Drivers of Corporate investment banker Strain: Low Risk  (12/22/2023)   Overall Financial Resource Strain (CARDIA)    Difficulty of Paying Living Expenses: Not hard at all  Food Insecurity: No Food Insecurity (12/22/2023)   Hunger Vital Sign    Worried About Running Out of Food in the Last Year: Never true    Ran Out of Food in the Last Year: Never true  Transportation Needs: No Transportation Needs (12/22/2023)   PRAPARE - Administrator, Civil Service (Medical): No    Lack of Transportation (Non-Medical): No  Physical Activity: Sufficiently Active (12/22/2023)   Exercise Vital Sign     Days of Exercise per Week: 3 days    Minutes of Exercise per Session: 80 min  Stress: No Stress Concern Present (12/22/2023)   [employer] of Occupational Health - Occupational Stress Questionnaire    Feeling of Stress : Only a little  Social Connections: Moderately Isolated (12/22/2023)   Social Connection and Isolation Panel [NHANES]    Frequency of Communication with Friends and Family: More than three times a week    Frequency of Social Gatherings with Friends and Family: Patient declined    Attends Religious Services: Never    Database administrator or Organizations: No    Attends Banker Meetings: Never    Marital Status: Married  Catering manager Violence: Not At Risk (09/14/2023)  Humiliation, Afraid, Rape, and Kick questionnaire    Fear of Current or Ex-Partner: No    Emotionally Abused: No    Physically Abused: No    Sexually Abused: No   Family Status  Relation Name Status   Mother Dot Donia Pounds Deceased at age 8   Father Rayna Sexton Long Deceased   Sister Bennye Alm Deceased at age 60       breast ca   Mat Aunt Colin Benton (Not Specified)   MGM Britt Bottom (Not Specified)   MGF Dian Queen (Not Specified)   PGF Grace Bushy (Not Specified)   Daughter Jarrett Soho (Not Specified)   Neg Hx  (Not Specified)  No partnership data on file   Family History  Problem Relation Age of Onset   Heart failure Mother        CHF   Hyperlipidemia Mother    Aortic stenosis Mother    Arthritis Mother    Heart disease Mother    Lung cancer Father 69   Cancer Father    Early death Father    Breast cancer Sister 66   Cancer Sister 90   Depression Sister    Stroke Maternal Aunt    Varicose Veins Maternal Aunt    Diabetes Maternal Grandmother    Hyperlipidemia Maternal Grandmother    Colon cancer Maternal Grandfather    Cancer Maternal Grandfather        colon cancer   Cancer Paternal Grandfather        unknown type cancer   Drug abuse Daughter    Heart  attack Neg Hx    Hypertension Neg Hx    Sudden death Neg Hx    Esophageal cancer Neg Hx    Stomach cancer Neg Hx    Rectal cancer Neg Hx    Allergies  Allergen Reactions   Statins Other (See Comments)    Myalgia    Augmentin [Amoxicillin-Pot Clavulanate] Nausea And Vomiting   Moxifloxacin Nausea And Vomiting      Review of Systems  Constitutional:  Negative for fever and malaise/fatigue.  HENT:  Negative for congestion.   Eyes:  Negative for blurred vision.  Respiratory:  Negative for cough and shortness of breath.   Cardiovascular:  Negative for chest pain, palpitations and leg swelling.  Gastrointestinal:  Negative for vomiting.  Musculoskeletal:  Negative for back pain.  Skin:  Negative for rash.  Neurological:  Negative for loss of consciousness and headaches.      Objective:     BP (!) 140/78 (BP Location: Left Arm, Patient Position: Sitting, Cuff Size: Normal)   Pulse 88   Temp 98.8 F (37.1 C) (Oral)   Resp 16   Ht 5\' 3"  (1.6 m)   Wt 119 lb 3.2 oz (54.1 kg)   SpO2 97%   BMI 21.12 kg/m  BP Readings from Last 3 Encounters:  01/15/24 (!) 140/78  12/22/23 (!) 160/84  12/05/23 (!) 155/67   Wt Readings from Last 3 Encounters:  01/15/24 119 lb 3.2 oz (54.1 kg)  12/22/23 118 lb 6.4 oz (53.7 kg)  12/05/23 127 lb (57.6 kg)   SpO2 Readings from Last 3 Encounters:  01/15/24 97%  12/22/23 98%  12/05/23 91%      Physical Exam Vitals and nursing note reviewed.  Constitutional:      General: She is not in acute distress.    Appearance: Normal appearance. She is well-developed.  HENT:     Head: Normocephalic and atraumatic.  Eyes:  General: No scleral icterus.       Right eye: No discharge.        Left eye: No discharge.  Cardiovascular:     Rate and Rhythm: Normal rate and regular rhythm.     Heart sounds: No murmur heard. Pulmonary:     Effort: Pulmonary effort is normal. No respiratory distress.     Breath sounds: Normal breath sounds.   Musculoskeletal:        General: Normal range of motion.     Cervical back: Normal range of motion and neck supple.     Right lower leg: No edema.     Left lower leg: No edema.  Skin:    General: Skin is warm and dry.  Neurological:     Mental Status: She is alert and oriented to person, place, and time.  Psychiatric:        Mood and Affect: Mood normal.        Behavior: Behavior normal.        Thought Content: Thought content normal.        Judgment: Judgment normal.      No results found for any visits on 01/15/24.  Last CBC Lab Results  Component Value Date   WBC 6.0 11/06/2023   HGB 13.7 11/06/2023   HCT 40.4 11/06/2023   MCV 91.6 11/06/2023   MCH 31.1 11/06/2023   RDW 13.2 11/06/2023   PLT 232 11/06/2023   Last metabolic panel Lab Results  Component Value Date   GLUCOSE 94 11/06/2023   NA 135 11/06/2023   K 3.7 11/06/2023   CL 103 11/06/2023   CO2 24 11/06/2023   BUN 12 11/06/2023   CREATININE 0.93 11/06/2023   GFRNONAA >60 11/06/2023   CALCIUM 9.1 11/06/2023   PROT 7.3 11/06/2023   ALBUMIN 4.0 11/06/2023   BILITOT 0.5 11/06/2023   ALKPHOS 51 11/06/2023   AST 28 11/06/2023   ALT 16 11/06/2023   ANIONGAP 8 11/06/2023   Last lipids Lab Results  Component Value Date   CHOL 241 (H) 01/31/2023   HDL 82.60 01/31/2023   LDLCALC 144 (H) 01/31/2023   LDLDIRECT 176.2 08/12/2013   TRIG 68.0 01/31/2023   CHOLHDL 3 01/31/2023   Last hemoglobin A1c Lab Results  Component Value Date   HGBA1C 5.1 08/04/2017   Last thyroid functions Lab Results  Component Value Date   TSH 1.31 05/15/2023   T4TOTAL 7.6 05/15/2023   Last vitamin D Lab Results  Component Value Date   VD25OH 87.21 08/02/2022   Last vitamin B12 and Folate No results found for: "VITAMINB12", "FOLATE"    The 10-year ASCVD risk score (Arnett DK, et al., 2019) is: 28%    Assessment & Plan:   Problem List Items Addressed This Visit       Unprioritized   Hyperlipidemia   Relevant  Medications   losartan (COZAAR) 100 MG tablet   Other Relevant Orders   Comprehensive metabolic panel   Lipid panel   Primary hypertension - Primary   Relevant Medications   losartan (COZAAR) 100 MG tablet   Other Relevant Orders   Comprehensive metabolic panel   Lipid panel  Assessment and Plan    Hypertension Systolic blood pressure readings are consistently 150-160 mmHg, exceeding the target range. Currently taking losartan without side effects. The goal is to reduce systolic pressure to approximately 130 mmHg. Plan to increase losartan to 100 mg daily, with a preference for nighttime dosing to monitor side effects during sleep.  Recheck blood pressure in two weeks and check kidney function today.  Hyperlipidemia Cholesterol check is overdue. Order a cholesterol panel today.  Follow-up Recheck blood pressure in two weeks. Follow-up with Dr. Ethelene Hal on April 17th for a wound check.        Return in about 2 weeks (around 01/29/2024), or if symptoms worsen or fail to improve, for bp check with nurse --- 6 mon with me.    Donato Schultz, DO

## 2024-01-17 ENCOUNTER — Encounter: Payer: Self-pay | Admitting: Family Medicine

## 2024-02-01 ENCOUNTER — Ambulatory Visit: Payer: Medicare PPO

## 2024-02-01 NOTE — Progress Notes (Signed)
 BP Readings from Last 3 Encounters:  01/15/24 (!) 140/78  12/22/23 (!) 160/84  12/05/23 (!) 155/67   Here for BP check per last ov 01/15/24 : Plan to increase losartan to 100 mg daily, with a preference for nighttime dosing to monitor side effects during sleep. Recheck blood pressure in two weeks. BP today is 150 /84 left arm and 155/ 84 on right arm with a pulse of 60 bpm.  Patient also check BP with her home wrist BP monitor, reading was 160/88. Patient reports readings are normal at home, in the 120/75 average for the past 3 days.  Per Dr. Zola Button, she was advised to continue same medication. Continue to monitor readings at home and follow up with [place] in 6 months. Appointment was set up.

## 2024-03-06 ENCOUNTER — Ambulatory Visit (INDEPENDENT_AMBULATORY_CARE_PROVIDER_SITE_OTHER): Payer: Medicare PPO

## 2024-03-06 DIAGNOSIS — I442 Atrioventricular block, complete: Secondary | ICD-10-CM

## 2024-03-06 NOTE — Progress Notes (Unsigned)
  Electrophysiology Office Note:   ID:  Samantha Miranda, Samantha Miranda 09-22-1946, MRN [id number]  Primary Cardiologist: None Electrophysiologist: Will Cortland Ding, MD  {Click to update primary MD,subspecialty MD or APP then REFRESH:1}    History of Present Illness:   Samantha Miranda is a 78 y.o. female with h/o COPD, HLD, and CHB seen today for routine electrophysiology followup s/p PPM  Since last being seen in our clinic the patient reports doing ***.  she denies chest pain, palpitations, dyspnea, PND, orthopnea, nausea, vomiting, dizziness, syncope, edema, weight gain, or early satiety.   Review of systems complete and found to be negative unless listed in HPI.   EP Information / Studies Reviewed:    EKG is ordered today. Personal review as below.       PPM Interrogation-  reviewed in detail today,  See PACEART report.  Arrhythmia/Device History CARELINK PPM MEDTRONIC   Physical Exam:   VS:  There were no vitals taken for this visit.   Wt Readings from Last 3 Encounters:  01/15/24 119 lb 3.2 oz (54.1 kg)  12/22/23 118 lb 6.4 oz (53.7 kg)  12/05/23 127 lb (57.6 kg)     GEN: No acute distress  NECK: No JVD; No carotid bruits CARDIAC: {EPRHYTHM:28826}, no murmurs, rubs, gallops RESPIRATORY:  Clear to auscultation without rales, wheezing or rhonchi  ABDOMEN: Soft, non-tender, non-distended EXTREMITIES:  {EDEMA LEVEL:28147::"No"} edema; No deformity   ASSESSMENT AND PLAN:    CHB s/p Medtronic PPM  Normal PPM function See Pace Art report No changes today  {Click here to Review PMH, Prob List, Meds, Allergies, SHx, FHx  :1}   Disposition:   Follow up with {EPPROVIDERS:28135} {EPFOLLOW UP:28173}  Signed, Tylene Galla, PA-C

## 2024-03-07 ENCOUNTER — Encounter: Payer: Self-pay | Admitting: Student

## 2024-03-07 ENCOUNTER — Ambulatory Visit: Payer: Medicare PPO | Attending: Pulmonary Disease | Admitting: Student

## 2024-03-07 VITALS — BP 130/84 | HR 77 | Ht 64.0 in | Wt 118.2 lb

## 2024-03-07 DIAGNOSIS — I442 Atrioventricular block, complete: Secondary | ICD-10-CM

## 2024-03-07 LAB — CUP PACEART REMOTE DEVICE CHECK
Battery Remaining Longevity: 155 mo
Battery Voltage: 3.21 V
Brady Statistic AP VP Percent: 0.47 %
Brady Statistic AP VS Percent: 0 %
Brady Statistic AS VP Percent: 99.51 %
Brady Statistic AS VS Percent: 0.01 %
Brady Statistic RA Percent Paced: 0.47 %
Brady Statistic RV Percent Paced: 99.99 %
Date Time Interrogation Session: 20250416050831
Implantable Lead Connection Status: 753985
Implantable Lead Connection Status: 753985
Implantable Lead Implant Date: 20250114
Implantable Lead Implant Date: 20250114
Implantable Lead Location: 753859
Implantable Lead Location: 753860
Implantable Lead Model: 3830
Implantable Lead Model: 5076
Implantable Pulse Generator Implant Date: 20250114
Lead Channel Impedance Value: 342 Ohm
Lead Channel Impedance Value: 399 Ohm
Lead Channel Impedance Value: 570 Ohm
Lead Channel Impedance Value: 608 Ohm
Lead Channel Pacing Threshold Amplitude: 0.5 V
Lead Channel Pacing Threshold Amplitude: 0.75 V
Lead Channel Pacing Threshold Pulse Width: 0.4 ms
Lead Channel Pacing Threshold Pulse Width: 0.4 ms
Lead Channel Sensing Intrinsic Amplitude: 2.25 mV
Lead Channel Sensing Intrinsic Amplitude: 2.25 mV
Lead Channel Sensing Intrinsic Amplitude: 31.625 mV
Lead Channel Sensing Intrinsic Amplitude: 31.625 mV
Lead Channel Setting Pacing Amplitude: 1.5 V
Lead Channel Setting Pacing Amplitude: 2 V
Lead Channel Setting Pacing Pulse Width: 0.4 ms
Lead Channel Setting Sensing Sensitivity: 2.8 mV
Zone Setting Status: 755011
Zone Setting Status: 755011

## 2024-03-07 LAB — CUP PACEART INCLINIC DEVICE CHECK
Battery Remaining Longevity: 155 mo
Battery Voltage: 3.21 V
Brady Statistic AP VP Percent: 0.47 %
Brady Statistic AP VS Percent: 0 %
Brady Statistic AS VP Percent: 99.51 %
Brady Statistic AS VS Percent: 0.01 %
Brady Statistic RA Percent Paced: 0.47 %
Brady Statistic RV Percent Paced: 99.99 %
Date Time Interrogation Session: 20250417111149
Implantable Lead Connection Status: 753985
Implantable Lead Connection Status: 753985
Implantable Lead Implant Date: 20250114
Implantable Lead Implant Date: 20250114
Implantable Lead Location: 753859
Implantable Lead Location: 753860
Implantable Lead Model: 3830
Implantable Lead Model: 5076
Implantable Pulse Generator Implant Date: 20250114
Lead Channel Impedance Value: 380 Ohm
Lead Channel Impedance Value: 418 Ohm
Lead Channel Impedance Value: 608 Ohm
Lead Channel Impedance Value: 608 Ohm
Lead Channel Pacing Threshold Amplitude: 0.5 V
Lead Channel Pacing Threshold Amplitude: 0.75 V
Lead Channel Pacing Threshold Pulse Width: 0.4 ms
Lead Channel Pacing Threshold Pulse Width: 0.4 ms
Lead Channel Sensing Intrinsic Amplitude: 2 mV
Lead Channel Sensing Intrinsic Amplitude: 2.375 mV
Lead Channel Sensing Intrinsic Amplitude: 29.75 mV
Lead Channel Sensing Intrinsic Amplitude: 31.625 mV
Lead Channel Setting Pacing Amplitude: 1.5 V
Lead Channel Setting Pacing Amplitude: 2 V
Lead Channel Setting Pacing Pulse Width: 0.4 ms
Lead Channel Setting Sensing Sensitivity: 2.8 mV
Zone Setting Status: 755011
Zone Setting Status: 755011

## 2024-03-07 NOTE — Patient Instructions (Signed)
 Medication Instructions:  Your physician recommends that you continue on your current medications as directed. Please refer to the Current Medication list given to you today.  *If you need a refill on your cardiac medications before your next appointment, please call your pharmacy*  Lab Work: None ordered If you have labs (blood work) drawn today and your tests are completely normal, you will receive your results only by: MyChart Message (if you have MyChart) OR A paper copy in the mail If you have any lab test that is abnormal or we need to change your treatment, we will call you to review the results.  Follow-Up: At [facility], you and your health needs are our priority.  As part of our continuing mission to provide you with exceptional heart care, our providers are all part of one team.  This team includes your primary Cardiologist (physician) and Advanced Practice Providers or APPs (Physician Assistants and Nurse Practitioners) who all work together to provide you with the care you need, when you need it.  Your next appointment:   1 year(s)  Provider:   You will see one of the following Advanced Practice Providers on your designated Care Team:   Francis Dowse, [place] Casimiro Needle "Mardelle Matte" Tillery, PA-C Canary Brim, NP      1st Floor: - Lobby - Registration  - Pharmacy  - Lab - Cafe  2nd Floor: - PV Lab - Diagnostic Testing (echo, CT, nuclear med)  3rd Floor: - Vacant  4th Floor: - TCTS (cardiothoracic surgery) - AFib Clinic - Structural Heart Clinic - Vascular Surgery  - Vascular Ultrasound  5th Floor: - HeartCare Cardiology (general and EP) - Clinical Pharmacy for coumadin, hypertension, lipid, weight-loss medications, and med management appointments    Valet parking services will be available as well.

## 2024-03-13 ENCOUNTER — Encounter: Payer: Self-pay | Admitting: Dermatology

## 2024-03-13 ENCOUNTER — Ambulatory Visit: Admitting: Dermatology

## 2024-03-13 VITALS — BP 146/89

## 2024-03-13 DIAGNOSIS — D492 Neoplasm of unspecified behavior of bone, soft tissue, and skin: Secondary | ICD-10-CM | POA: Diagnosis not present

## 2024-03-13 DIAGNOSIS — L821 Other seborrheic keratosis: Secondary | ICD-10-CM | POA: Diagnosis not present

## 2024-03-13 DIAGNOSIS — D1801 Hemangioma of skin and subcutaneous tissue: Secondary | ICD-10-CM | POA: Diagnosis not present

## 2024-03-13 DIAGNOSIS — W908XXA Exposure to other nonionizing radiation, initial encounter: Secondary | ICD-10-CM

## 2024-03-13 DIAGNOSIS — C44519 Basal cell carcinoma of skin of other part of trunk: Secondary | ICD-10-CM | POA: Diagnosis not present

## 2024-03-13 DIAGNOSIS — L814 Other melanin hyperpigmentation: Secondary | ICD-10-CM | POA: Diagnosis not present

## 2024-03-13 DIAGNOSIS — Z85828 Personal history of other malignant neoplasm of skin: Secondary | ICD-10-CM

## 2024-03-13 DIAGNOSIS — Z1283 Encounter for screening for malignant neoplasm of skin: Secondary | ICD-10-CM

## 2024-03-13 DIAGNOSIS — L578 Other skin changes due to chronic exposure to nonionizing radiation: Secondary | ICD-10-CM

## 2024-03-13 DIAGNOSIS — D2230 Melanocytic nevi of unspecified part of face: Secondary | ICD-10-CM

## 2024-03-13 DIAGNOSIS — D485 Neoplasm of uncertain behavior of skin: Secondary | ICD-10-CM

## 2024-03-13 NOTE — Progress Notes (Signed)
   New Patient Visit   Subjective  Samantha Miranda is a 78 y.o. female who presents for the following: Skin Cancer Screening and Full Body Skin Exam - History of BCC. No family history of melanoma. She grew up in [place] .  The patient presents for Total-Body Skin Exam (TBSE) for skin cancer screening and mole check. The patient has spots, moles and lesions to be evaluated, some may be new or changing.  The following portions of the chart were reviewed this encounter and updated as appropriate: medications, allergies, medical history  Review of Systems:  No other skin or systemic complaints except as noted in HPI or Assessment and Plan.  Objective  Well appearing patient in no apparent distress; mood and affect are within normal limits.  A full examination was performed including scalp, head, eyes, ears, nose, lips, neck, chest, axillae, abdomen, back, buttocks, bilateral upper extremities, bilateral lower extremities, hands, feet, fingers, toes, fingernails, and toenails. All findings within normal limits unless otherwise noted below.   Relevant physical exam findings are noted in the Assessment and Plan.  Right chest 6 mm pink pearly papule   Assessment & Plan   SKIN CANCER SCREENING PERFORMED TODAY.  ACTINIC DAMAGE - Chronic condition, secondary to cumulative UV/sun exposure - diffuse scaly erythematous macules with underlying dyspigmentation - Recommend daily broad spectrum sunscreen SPF 30+ to sun-exposed areas, reapply every 2 hours as needed.  - Staying in the shade or wearing long sleeves, sun glasses (UVA+UVB protection) and wide brim hats (4-inch brim around the entire circumference of the hat) are also recommended for sun protection.  - Call for new or changing lesions.  LENTIGINES, SEBORRHEIC KERATOSES, HEMANGIOMAS - Benign normal skin lesions - Benign-appearing - Call for any changes  MELANOCYTIC NEVI - Tan-brown and/or pink-flesh-colored symmetric macules and  papules - Benign appearing on exam today - Observation - Call clinic for new or changing moles - Recommend daily use of broad spectrum spf 30+ sunscreen to sun-exposed areas.   HISTORY OF BASAL CELL CARCINOMA OF THE SKIN - No evidence of recurrence today - Recommend regular full body skin exams - Recommend daily broad spectrum sunscreen SPF 30+ to sun-exposed areas, reapply every 2 hours as needed.  - Call if any new or changing lesions are noted between office visits  NEOPLASM OF UNCERTAIN BEHAVIOR OF SKIN Right chest Skin / nail biopsy Type of biopsy: tangential   Informed consent: discussed and consent obtained   Timeout: patient name, date of birth, surgical site, and procedure verified   Procedure prep:  Patient was prepped and draped in usual sterile fashion Prep type:  Isopropyl alcohol Anesthesia: the lesion was anesthetized in a standard fashion   Anesthetic:  1% lidocaine  w/ epinephrine  1-100,000 buffered w/ 8.4% NaHCO3 Instrument used: flexible razor blade   Hemostasis achieved with: pressure, aluminum chloride and electrodesiccation   Outcome: patient tolerated procedure well   Post-procedure details: sterile dressing applied and wound care instructions given   Dressing type: bandage and petrolatum   Specimen 1 - Surgical pathology Differential Diagnosis: BCC vs other   Check Margins: No  Return in about 6 months (around 09/12/2024) for TBSE.  I, Eliot Guernsey, CMA, am acting as scribe for Deneise Finlay, MD .   Documentation: I have reviewed the above documentation for accuracy and completeness, and I agree with the above.  Deneise Finlay, MD

## 2024-03-13 NOTE — Patient Instructions (Signed)

## 2024-03-18 LAB — SURGICAL PATHOLOGY

## 2024-03-19 ENCOUNTER — Telehealth: Payer: Self-pay

## 2024-03-19 NOTE — Telephone Encounter (Signed)
 Spoke with pt gave her bx results and treatment recommendations

## 2024-03-19 NOTE — Telephone Encounter (Signed)
-----   Message from [facility] PACI sent at 03/19/2024  9:20 AM EDT ----- Please call patient to discuss results showing BCC on right chest and get her scheduled for excision with me.

## 2024-03-22 DIAGNOSIS — H04123 Dry eye syndrome of bilateral lacrimal glands: Secondary | ICD-10-CM | POA: Diagnosis not present

## 2024-03-22 DIAGNOSIS — Z79899 Other long term (current) drug therapy: Secondary | ICD-10-CM | POA: Diagnosis not present

## 2024-03-22 DIAGNOSIS — H524 Presbyopia: Secondary | ICD-10-CM | POA: Diagnosis not present

## 2024-03-22 DIAGNOSIS — H52223 Regular astigmatism, bilateral: Secondary | ICD-10-CM | POA: Diagnosis not present

## 2024-03-22 DIAGNOSIS — H5213 Myopia, bilateral: Secondary | ICD-10-CM | POA: Diagnosis not present

## 2024-03-27 ENCOUNTER — Other Ambulatory Visit: Payer: Self-pay

## 2024-03-27 DIAGNOSIS — Z171 Estrogen receptor negative status [ER-]: Secondary | ICD-10-CM

## 2024-03-28 ENCOUNTER — Inpatient Hospital Stay: Payer: Medicare PPO | Attending: Hematology

## 2024-03-28 ENCOUNTER — Inpatient Hospital Stay: Payer: Medicare PPO | Admitting: Hematology

## 2024-03-28 ENCOUNTER — Other Ambulatory Visit: Payer: Self-pay | Admitting: Family Medicine

## 2024-03-28 VITALS — BP 122/70 | HR 91 | Temp 98.0°F | Resp 21 | Ht 64.0 in | Wt 121.5 lb

## 2024-03-28 DIAGNOSIS — C50111 Malignant neoplasm of central portion of right female breast: Secondary | ICD-10-CM

## 2024-03-28 DIAGNOSIS — Z170 Estrogen receptor positive status [ER+]: Secondary | ICD-10-CM | POA: Diagnosis not present

## 2024-03-28 DIAGNOSIS — Z171 Estrogen receptor negative status [ER-]: Secondary | ICD-10-CM

## 2024-03-28 DIAGNOSIS — R911 Solitary pulmonary nodule: Secondary | ICD-10-CM | POA: Insufficient documentation

## 2024-03-28 DIAGNOSIS — K219 Gastro-esophageal reflux disease without esophagitis: Secondary | ICD-10-CM

## 2024-03-28 DIAGNOSIS — Z1231 Encounter for screening mammogram for malignant neoplasm of breast: Secondary | ICD-10-CM | POA: Diagnosis not present

## 2024-03-28 LAB — CMP (CANCER CENTER ONLY)
ALT: 14 U/L (ref 0–44)
AST: 25 U/L (ref 15–41)
Albumin: 4.2 g/dL (ref 3.5–5.0)
Alkaline Phosphatase: 54 U/L (ref 38–126)
Anion gap: 6 (ref 5–15)
BUN: 19 mg/dL (ref 8–23)
CO2: 27 mmol/L (ref 22–32)
Calcium: 9.4 mg/dL (ref 8.9–10.3)
Chloride: 104 mmol/L (ref 98–111)
Creatinine: 1 mg/dL (ref 0.44–1.00)
GFR, Estimated: 58 mL/min — ABNORMAL LOW (ref >60)
Glucose, Bld: 88 mg/dL (ref 70–99)
Potassium: 3.9 mmol/L (ref 3.5–5.1)
Sodium: 137 mmol/L (ref 135–145)
Total Bilirubin: 0.5 mg/dL (ref 0.0–1.2)
Total Protein: 7.2 g/dL (ref 6.5–8.1)

## 2024-03-28 LAB — CBC WITH DIFFERENTIAL (CANCER CENTER ONLY)
Abs Immature Granulocytes: 0.01 10*3/uL (ref 0.00–0.07)
Basophils Absolute: 0.1 10*3/uL (ref 0.0–0.1)
Basophils Relative: 1 %
Eosinophils Absolute: 0.2 10*3/uL (ref 0.0–0.5)
Eosinophils Relative: 3 %
HCT: 38.1 % (ref 36.0–46.0)
Hemoglobin: 13.2 g/dL (ref 12.0–15.0)
Immature Granulocytes: 0 %
Lymphocytes Relative: 26 %
Lymphs Abs: 1.6 10*3/uL (ref 0.7–4.0)
MCH: 30.2 pg (ref 26.0–34.0)
MCHC: 34.6 g/dL (ref 30.0–36.0)
MCV: 87.2 fL (ref 80.0–100.0)
Monocytes Absolute: 0.5 10*3/uL (ref 0.1–1.0)
Monocytes Relative: 8 %
Neutro Abs: 3.8 10*3/uL (ref 1.7–7.7)
Neutrophils Relative %: 62 %
Platelet Count: 179 10*3/uL (ref 150–400)
RBC: 4.37 MIL/uL (ref 3.87–5.11)
RDW: 12.8 % (ref 11.5–15.5)
WBC Count: 6.3 10*3/uL (ref 4.0–10.5)
nRBC: 0 % (ref 0.0–0.2)

## 2024-03-28 NOTE — Progress Notes (Signed)
 [facility]   [phone] [phone]   Clinic Follow up Note   Patient Care Team: Crecencio Dodge, Candida Chalk, DO as PCP - General Lei Pump, MD as PCP - Electrophysiology (Cardiology) Gaston Karvonen Doree Games, MD as Referring Physician (Optometry) Chrystal Crape (Dermatology) Enid Harry, MD as Consulting Physician (General Surgery) Sonja [place], MD as Consulting Physician (Hematology) Larrie Po, MD as Consulting Physician (Rheumatology)  Date of Service:  03/28/2024  CHIEF COMPLAINT: f/u of breast cancer  CURRENT THERAPY:  Cancer surveillance  Oncology History   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 S/p lumpectomy on 07/02/21 by Dr. Delane Fear showed 1.2 cm IDC and DCIS, margins and lymph nodes negative. -given her previous right breast radiation, additional radiation is not recommended. -RS of 28, risk of 17%, chemo was recommended but pt declined -most recent mammogram 07/07/22 was benign. -she did not tolerate antiestrogens-- exemestane  caused worsening depression, insomnia, and joint pain; tamoxifen  caused unbearable hot flashes. She is on surveillance   Assessment & Plan Breast cancer, stage 1A Stage 1A breast cancer diagnosed in 2022. Underwent surgery but did not receive radiation due to prior treatment. Not on anti-estrogen therapy due to intolerance. Oncotype recurrence score of 28 indicates higher recurrence risk, but chemotherapy was declined. No clinical signs of recurrence; recent labs and physical examination are normal. Small risk of lung metastasis; advised to monitor for symptoms such as persistent cough or dyspnea. 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 No current issues related to DCIS.  Lung nodule 5.2 mm lung nodule identified in January. No high concerns for malignancy. Pt is aymptomatic, no persistent cough or dyspnea. Follow-up scan scheduled for June. - Perform follow-up lung scan in June  Rheumatoid arthritis Rheumatoid arthritis is well-managed, with mild exacerbation possibly related to recent gardening activities. No significant impact on daily activities.  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 Pacemaker is functioning well, and she is under cardiology care. No recent dizziness or syncope.  Plan -she is clinically doing well, no concerns for cancer recurrence  -lab and f/u with NP Loris Ros in one year    SUMMARY OF ONCOLOGIC HISTORY: Oncology History Overview Note  Cancer Staging 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 [facility]) Staging form: Breast, AJCC 8th Edition - Clinical stage from 06/21/2021: Stage IA (cT1b, cN0, cM0, G1, ER+, PR+, HER2-) - Signed by Sonja Phillips, MD on 06/23/2021 Stage prefix: Initial diagnosis Histologic grading system: 3 grade system - Pathologic stage from 07/02/2021: Stage IA (pT1c, pN0, cM0, G1, ER+, PR+, HER2-, Oncotype DX score: 28) - Signed by Sonja [place], MD on 07/21/2021 Stage prefix: Initial diagnosis Multigene prognostic tests performed: Oncotype DX Recurrence score range: Greater than or equal to 11 Histologic grading system: 3 grade system     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   06/17/2021 Pathology Results   Diagnosis Breast, right, needle core biopsy, 3:00, 4 cmfn  - INVASIVE MAMMARY CARCINOMA, SEE COMMENT  - MAMMARY CARCINOMA IN SITU. The carcinoma appears grade  1. E-cadherin is positive in the invasive and in situ carcinoma consistent with a  ductal phenotype.  PROGNOSTIC INDICATORS Results: IMMUNOHISTOCHEMICAL AND MORPHOMETRIC ANALYSIS PERFORMED MANUALLY The tumor cells are EQUIVOCAL for HER2 (2+). Her2 by FISH will be performed and results reported separately. Estrogen Receptor: 90%, POSITIVE, STRONG STAINING INTENSITY Progesterone Receptor: 5%, POSITIVE, STRONG STAINING INTENSITY Proliferation Marker Ki67: 10%  Her2 negative by [facility]   06/21/2021 Cancer Staging   Staging form: Breast, AJCC 8th Edition - Clinical stage from 06/21/2021: Stage IA (cT1b, cN0, cM0, G1, ER+, PR+, HER2-) - Signed by Sonja [place], MD on 06/23/2021 Stage prefix: Initial diagnosis Histologic grading system: 3 grade system   06/23/2021 Initial Diagnosis   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 (HCC)   07/02/2021 Pathology Results   FINAL MICROSCOPIC DIAGNOSIS:   A. BREAST, RIGHT, LUMPECTOMY:  - Invasive and in situ ductal carcinoma, 1.2 cm.  - Carcinoma is 0.7 cm from superior margin.  - Biopsy site and biopsy clip.  - See oncology table.   B. BREAST, RIGHT ADDITIONAL INFERIOR MARGIN, EXCISION:  - Fibrocystic changes.  - Final inferior margin negative for carcinoma.   C. LYMPH NODE, RIGHT AXILLARY, SENTINEL, EXCISION:  - One lymph node with capsular nevus and negative for metastatic  carcinoma (0/1).   D. LYMPH NODE, RIGHT AXILLARY, SENTINEL, EXCISION:  - One lymph node negative for metastatic carcinoma (0/1).   E. LYMPH NODE, RIGHT AXILLARY, SENTINEL, EXCISION:  - One lymph node negative for metastatic carcinoma (0/1).   F. LYMPH NODE, RIGHT AXILLARY, SENTINEL, EXCISION:  - One lymph node negative for metastatic carcinoma (0/1).   G. LYMPH NODE, RIGHT AXILLARY, SENTINEL, EXCISION:  - One lymph node negative for metastatic carcinoma (0/1).   H. BREAST, RIGHT ADDITIONAL LATERAL MARGIN, EXCISION:  - Fibrocystic changes.  - Final lateral margin negative for carcinoma.   I. BREAST, RIGHT ADDITIONAL POSTERIOR MARGIN, EXCISION:  -  Benign breast tissue.  - Final posterior margin negative for carcinoma.    07/02/2021 Cancer Staging   Staging form: Breast, AJCC 8th Edition - Pathologic stage from 07/02/2021: Stage IA (pT1c, pN0, cM0, G1, ER+, PR+, HER2-, Oncotype DX score: 28) - Signed by Sonja Anita, MD on 07/21/2021 Stage prefix: Initial diagnosis Multigene prognostic tests performed: Oncotype DX Recurrence score range: Greater than or equal to 11 Histologic grading system: 3 grade system   03/2022 -  Anti-estrogen oral therapy   Unable to tolerate any of the aromatase inhibitors or low-dose tamoxifen .      Discussed the use of AI scribe software for clinical note transcription with the patient, who gave verbal consent to proceed.  History of Present Illness Samantha Miranda is a 78 year old female with breast cancer who presents for follow-up.  Diagnosed with breast cancer in 2022, [place] underwent surgery and is currently under surveillance without active treatment. She did not receive radiation therapy due to prior treatment and is not on anti-estrogen therapy due to intolerance. She has no current pain or concerns related to breast cancer. Previously diagnosed with DCIS.  A lung scan in January showed a 5.2 mm nodule, with a follow-up scan scheduled for June. She experiences occasional shortness of breath, attributed to allergies and bronchitis, but no significant respiratory symptoms related to the nodule.     All other systems were reviewed with the patient and are negative.  MEDICAL HISTORY:  Past Medical History:  Diagnosis Date   Barrett's esophagus    Basal cell carcinoma  multiple of face - some treated with Mohs at [facility]   Basal cell carcinoma of left lower leg    Breast cancer (HCC)    right   COPD (chronic obstructive pulmonary disease) (HCC) 2018   Depression 1989   Gastric ulcer    GERD (gastroesophageal reflux disease)    History of colon polyps    Hyperlipidemia     Osteopenia     SURGICAL HISTORY: Past Surgical History:  Procedure Laterality Date   ABDOMINAL HYSTERECTOMY  11/21/1989   APPENDECTOMY  1969   BREAST LUMPECTOMY  07/22/2006   BREAST LUMPECTOMY WITH RADIOACTIVE SEED AND SENTINEL LYMPH NODE BIOPSY Right 07/02/2021   Procedure: RIGHT BREAST LUMPECTOMY WITH RADIOACTIVE SEED AND RIGHT AXILLARY SENTINEL LYMPH NODE BIOPSY;  Surgeon: Enid Harry, MD;  Location: [facility] OR;  Service: General;  Laterality: Right;   CATARACT EXTRACTION, BILATERAL     EYE SURGERY  08/2013  10/2013   cataracts   PACEMAKER IMPLANT N/A 12/05/2023   Procedure: PACEMAKER IMPLANT;  Surgeon: Lei Pump, MD;  Location: [facility] INVASIVE CV LAB;  Service: Cardiovascular;  Laterality: N/A;   TONSILLECTOMY      I have reviewed the social history and family history with the patient and they are unchanged from previous note.  ALLERGIES:  is allergic to statins, augmentin  [amoxicillin -pot clavulanate], and moxifloxacin.  MEDICATIONS:  Current Outpatient Medications  Medication Sig Dispense Refill   acetaminophen  (TYLENOL ) 325 MG tablet Take 2 tablets (650 mg total) by mouth every 6 (six) hours as needed for moderate pain (pain score 4-6). 30 tablet 0   Calcium -Magnesium-Zinc (CAL-MAG-ZINC PO) Take 2 tablets by mouth daily.     cholecalciferol (VITAMIN D3) 25 MCG (1000 UNIT) tablet Take 1,000 Units by mouth daily.     ezetimibe  (ZETIA ) 10 MG tablet TAKE ONE TABLET BY MOUTH ONE TIME DAILY 90 tablet 1   fluticasone  (FLONASE ) 50 MCG/ACT nasal spray Place 2 sprays into both nostrils daily. (Patient taking differently: Place 2 sprays into both nostrils daily as needed for allergies.) 16 g 5   fluticasone -salmeterol (ADVAIR) 100-50 MCG/ACT AEPB INHALE ONE PUFF BY MOUTH TWICE A DAY 60 each 5   folic acid (FOLVITE) 1 MG tablet Take 1 mg by mouth daily.     gabapentin  (NEURONTIN ) 100 MG capsule TAKE ONE CAPSULE BY MOUTH THREE TIMES A DAY (Patient taking differently: Take 100 mg by  mouth at bedtime as needed (pain/RLS).) 90 capsule 0   hydroxychloroquine  (PLAQUENIL ) 200 MG tablet Take 1 tablet (200 mg total) by mouth daily. 30 tablet 5   loratadine  (CLARITIN ) 10 MG tablet Take 1 tablet (10 mg total) by mouth daily. (Patient taking differently: Take 10 mg by mouth daily as needed for allergies or rhinitis.) 30 tablet 11   losartan  (COZAAR ) 100 MG tablet Take 1 tablet (100 mg total) by mouth daily. 90 tablet 1   OLIVE LEAF EXTRACT PO Take 1 drop by mouth daily.     Omega-3 Fatty Acids (FISH OIL) 1200 MG CAPS Take 1,200 mg by mouth daily.     ondansetron  (ZOFRAN ) 4 MG tablet TAKE ONE TABLET BY MOUTH EVERY 8 HOURS AS NEEDED FOR NAUSEA OR VOMITING 20 tablet 0   pantoprazole  (PROTONIX ) 40 MG tablet Take 1 tablet (40 mg total) by mouth daily before breakfast. 90 tablet 1   Polyethyl Glycol-Propyl Glycol (SYSTANE ULTRA) 0.4-0.3 % SOLN Apply 1 drop to eye as needed (dry eyes).     vitamin C (ASCORBIC ACID) 250 MG  tablet Take 250 mg by mouth daily.     XIIDRA  5 % SOLN Place 1 drop into both eyes in the morning and at bedtime.  1   zolpidem  (AMBIEN ) 5 MG tablet TAKE ONE TABLET BY MOUTH AT BEDTIME AS NEEDED FOR SLEEP 30 tablet 1   No current facility-administered medications for this visit.    PHYSICAL EXAMINATION: ECOG PERFORMANCE STATUS: 0 - Asymptomatic  Vitals:   03/28/24 1413  BP: 122/70  Pulse: 91  Resp: (!) 21  Temp: 98 F (36.7 C)  SpO2: 95%   Wt Readings from Last 3 Encounters:  03/28/24 121 lb 8 oz (55.1 kg)  03/07/24 118 lb 3.2 oz (53.6 kg)  01/15/24 119 lb 3.2 oz (54.1 kg)     GENERAL:alert, no distress and comfortable SKIN: skin color, texture, turgor are normal, no rashes or significant lesions EYES: normal, Conjunctiva are pink and non-injected, sclera clear NECK: supple, thyroid  normal size, non-tender, without nodularity LYMPH:  no palpable lymphadenopathy in the cervical, axillary  LUNGS: clear to auscultation and percussion with normal breathing  effort HEART: regular rate & rhythm and no murmurs and no lower extremity edema ABDOMEN:abdomen soft, non-tender and normal bowel sounds Musculoskeletal:no cyanosis of digits and no clubbing  NEURO: alert & oriented x 3 with fluent speech, no focal motor/sensory deficits Breasts: Breast inspection showed them to be symmetrical with no nipple discharge. (+) chronic flat nipple. Palpation of the breasts and axilla revealed no obvious mass that I could appreciate.  Physical Exam   LABORATORY DATA:  I have reviewed the data as listed    Latest Ref Rng & Units 03/28/2024    1:34 PM 11/06/2023    1:12 PM 09/28/2023   12:35 PM  CBC  WBC 4.0 - 10.5 K/uL 6.3  6.0  4.9   Hemoglobin 12.0 - 15.0 g/dL 98.1  19.1  47.8   Hematocrit 36.0 - 46.0 % 38.1  40.4  37.7   Platelets 150 - 400 K/uL 179  232  171         Latest Ref Rng & Units 03/28/2024    1:34 PM 01/15/2024    1:22 PM 11/06/2023    1:12 PM  CMP  Glucose 70 - 99 mg/dL 88  82  94   BUN 8 - 23 mg/dL 19  22  12    Creatinine 0.44 - 1.00 mg/dL 2.95  6.21  3.08   Sodium 135 - 145 mmol/L 137  139  135   Potassium 3.5 - 5.1 mmol/L 3.9  4.2  3.7   Chloride 98 - 111 mmol/L 104  102  103   CO2 22 - 32 mmol/L 27  28  24    Calcium  8.9 - 10.3 mg/dL 9.4  9.5  9.1   Total Protein 6.5 - 8.1 g/dL 7.2  7.1  7.3   Total Bilirubin 0.0 - 1.2 mg/dL 0.5  0.4  0.5   Alkaline Phos 38 - 126 U/L 54  46  51   AST 15 - 41 U/L 25  21  28    ALT 0 - 44 U/L 14  12  16        RADIOGRAPHIC STUDIES: I have personally reviewed the radiological images as listed and agreed with the findings in the report. No results found.    Orders Placed This Encounter  Procedures  MM Digital Screening    Standing Status:   Future    Expiration Date:   03/28/2025    Scheduling Instructions:     [facility]    Reason for Exam (SYMPTOM  OR DIAGNOSIS REQUIRED):   screening    Preferred imaging location?:   External   All questions were  answered. The patient knows to call the clinic with any problems, questions or concerns. No barriers to learning was detected. The total time spent in the appointment was 25 minutes, including review of chart and various tests results, discussions about plan of care and coordination of care plan     Sonja [place], MD 03/28/2024

## 2024-03-28 NOTE — Assessment & Plan Note (Signed)
 Stage IA, p(T1b, N0, M0), ER+/PR+/HER2-, Grade 1, RS 28 -found on screening mammogram, diagnosed 05/2021. S/p lumpectomy on 07/02/21 by Dr. Delane Fear showed 1.2 cm IDC and DCIS, margins and lymph nodes negative. -given her previous right breast radiation, additional radiation is not recommended. -RS of 28, risk of 17%, chemo was recommended but pt declined -most recent mammogram 07/07/22 was benign. -she did not tolerate antiestrogens-- exemestane  caused worsening depression, insomnia, and joint pain; tamoxifen  caused unbearable hot flashes. She is on surveillance

## 2024-04-01 ENCOUNTER — Encounter: Payer: Self-pay | Admitting: Family Medicine

## 2024-04-01 ENCOUNTER — Ambulatory Visit: Admitting: Family Medicine

## 2024-04-01 ENCOUNTER — Ambulatory Visit (HOSPITAL_BASED_OUTPATIENT_CLINIC_OR_DEPARTMENT_OTHER)
Admission: RE | Admit: 2024-04-01 | Discharge: 2024-04-01 | Disposition: A | Discharge status: Home / Self Care | Source: Ambulatory Visit | Attending: Family Medicine | Admitting: Family Medicine

## 2024-04-01 VITALS — BP 110/70 | HR 89 | Temp 97.8°F | Resp 18 | Ht 64.0 in | Wt 119.8 lb

## 2024-04-01 DIAGNOSIS — J40 Bronchitis, not specified as acute or chronic: Secondary | ICD-10-CM | POA: Insufficient documentation

## 2024-04-01 DIAGNOSIS — M0579 Rheumatoid arthritis with rheumatoid factor of multiple sites without organ or systems involvement: Secondary | ICD-10-CM

## 2024-04-01 DIAGNOSIS — Z95811 Presence of heart assist device: Secondary | ICD-10-CM | POA: Insufficient documentation

## 2024-04-01 DIAGNOSIS — R059 Cough, unspecified: Secondary | ICD-10-CM | POA: Diagnosis not present

## 2024-04-01 DIAGNOSIS — I700 Atherosclerosis of aorta: Secondary | ICD-10-CM | POA: Diagnosis not present

## 2024-04-01 DIAGNOSIS — J439 Emphysema, unspecified: Secondary | ICD-10-CM

## 2024-04-01 DIAGNOSIS — Z950 Presence of cardiac pacemaker: Secondary | ICD-10-CM | POA: Diagnosis not present

## 2024-04-01 MED ORDER — METHYLPREDNISOLONE ACETATE 80 MG/ML IJ SUSP
80.0000 mg | Freq: Once | INTRAMUSCULAR | Status: AC
  Administered 2024-04-01: 80 mg via INTRAMUSCULAR

## 2024-04-01 MED ORDER — AZITHROMYCIN 250 MG PO TABS: ORAL_TABLET | ORAL | 0 refills | Status: DC

## 2024-04-01 MED ORDER — PREDNISONE 10 MG PO TABS: ORAL_TABLET | ORAL | 0 refills | Status: DC

## 2024-04-01 MED ORDER — PROMETHAZINE-DM 6.25-15 MG/5ML PO SYRP: 5.0000 mL | ORAL_SOLUTION | Freq: Four times a day (QID) | ORAL | 0 refills | Status: DC | PRN

## 2024-04-01 NOTE — Patient Instructions (Signed)

## 2024-04-01 NOTE — Progress Notes (Addendum)
 Established Patient Office Visit  Subjective   Patient ID: Samantha Miranda, female    DOB: [birth year]  Age: 78 y.o. MRN: [id number]  Chief Complaint  Patient presents with   Cough    Sxs started last Wednesday, prod cough, some SOB. Temp of 99.9. no meds    HPI Discussed the use of AI scribe software for clinical note transcription with the patient, who gave verbal consent to proceed.  History of Present Illness Samantha Miranda is a 78 year old female who presents with cough and chest tightness.  Symptoms began last Wednesday with nasal congestion, chest tightness, and a persistent productive cough with green-yellow sputum. She experiences mild fever up to 46F intermittently, sore throat, and occasional wheezing, particularly when coughing. Chest pain occurs with deep breaths.  She has been using her inhalers regularly and takes Tylenol  for arthritis, as her shoulder pain has worsened, possibly due to weather changes. No other over-the-counter medications reported.  She recalls having a lung scan in June and labs at the cancer center. She spent most of Mother's Day in bed due to her illness but was able to enjoy a meal with her family.   Patient Active Problem List   Diagnosis Date Noted   Presence of heart assist device (HCC) 04/01/2024   Primary hypertension 12/22/2023   Rheumatoid arthritis involving multiple sites with positive rheumatoid factor (HCC) 05/15/2023   Hemorrhoids 05/15/2023   Neck nodule 05/15/2023   COVID-19 06/20/2022   Acute pain of both shoulders 04/26/2022   Bunion, right 02/07/2022   DOE (dyspnea on exertion) 12/20/2021   Pulmonary emphysema, unspecified emphysema type (HCC) 12/20/2021   Atherosclerosis of aorta (HCC) 12/20/2021   Gastroenteritis 08/23/2021   Preventative health care 07/22/2021   Insomnia 07/22/2021   History of peptic ulcer 07/22/2021   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 (HCC) 06/23/2021   Depression, major, single  episode, 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   Influenza vaccine administered 11/02/2015   Otitis media 08/10/2015   Acute bronchitis 04/21/2015   Right ankle pain 02/10/2014   Left shoulder pain 11/22/2013   Right shoulder pain 08/20/2013   Near syncope 08/12/2013   Other malaise and fatigue 08/12/2013   Rectal itching 07/01/2013   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   Gastroesophageal reflux disease 08/12/2012   Former heavy tobacco smoker 08/26/2010   History of colonic polyps 08/26/2010   POSTMENOPAUSAL STATUS 08/26/2010   POSTMENOPAUSAL BLEEDING 04/13/2009   Hyperlipidemia 07/11/2007   DEPRESSION 07/11/2007   OSTEOPENIA 07/11/2007   BREAST CANCER, HX OF 07/11/2007   Past Medical History:  Diagnosis Date   Barrett's esophagus    Basal cell carcinoma    multiple of face - some treated with Mohs at [facility]   Basal cell carcinoma of left lower leg    Breast cancer (HCC)    right   COPD (chronic obstructive pulmonary disease) (HCC) 2018   Depression 1989   Gastric ulcer    GERD (gastroesophageal reflux disease)    History of colon polyps    Hyperlipidemia    Osteopenia    Past Surgical History:  Procedure Laterality Date   ABDOMINAL HYSTERECTOMY  11/21/1989   APPENDECTOMY  1969   BREAST LUMPECTOMY  07/22/2006   BREAST LUMPECTOMY WITH RADIOACTIVE SEED AND SENTINEL LYMPH NODE BIOPSY Right 07/02/2021   Procedure: RIGHT BREAST LUMPECTOMY WITH RADIOACTIVE SEED AND RIGHT AXILLARY SENTINEL LYMPH NODE BIOPSY;  Surgeon: Enid Harry, MD;  Location: [facility] OR;  Service:  General;  Laterality: Right;   CATARACT EXTRACTION, BILATERAL     EYE SURGERY  08/2013  10/2013   cataracts   PACEMAKER IMPLANT N/A 12/05/2023   Procedure: PACEMAKER IMPLANT;  Surgeon: Lei Pump, MD;  Location: [facility] INVASIVE CV [facility];  Service: Cardiovascular;  Laterality: N/A;   TONSILLECTOMY     Social History   Tobacco Use   Smoking status: Former    Current packs/day: 0.00    Average  packs/day: 0.5 packs/day for 40.0 years (20.0 ttl pk-yrs)    Types: Cigarettes, E-cigarettes    Start date: 07/23/1971    Quit date: 07/23/2011    Years since quitting: 12.7   Smokeless tobacco: Never   Tobacco comments:    The last 5 yrs. I smoked .5 pack a day. Before that, 1 pk. a day  Vaping Use   Vaping status: Former   Substances: Nicotine  Substance Use Topics   Alcohol use: Yes    Comment: occasional wine   Drug use: No   Social History   Socioeconomic History   Marital status: Married    Spouse name: Not on file   Number of children: 1   Years of education: Not on file   Highest education level: Associate degree: occupational, Scientist, product/process development, or vocational program  Occupational History   Occupation: retired    Associate Professor:  [employer]    Comment: retired  Tobacco Use   Smoking status: Former    Current packs/day: 0.00    Average packs/day: 0.5 packs/day for 40.0 years (20.0 ttl pk-yrs)    Types: Cigarettes, E-cigarettes    Start date: 07/23/1971    Quit date: 07/23/2011    Years since quitting: 12.7   Smokeless tobacco: Never   Tobacco comments:    The last 5 yrs. I smoked .5 pack a day. Before that, 1 pk. a day  Vaping Use   Vaping status: Former   Substances: Nicotine  Substance and Sexual Activity   Alcohol use: Yes    Comment: occasional wine   Drug use: No   Sexual activity: Not Currently    Partners: Male    Birth control/protection: Post-menopausal  Other Topics Concern   Not on file  Social History Narrative   Exercise-virtual class, MWF   Social Drivers of Corporate investment banker Strain: Low Risk  (12/22/2023)   Overall Financial Resource Strain (CARDIA)    Difficulty of Paying Living Expenses: Not hard at all  Food Insecurity: No Food Insecurity (12/22/2023)   Hunger Vital Sign    Worried About Running Out of Food in the Last Year: Never true    Ran Out of Food in the Last Year: Never true  Transportation Needs: No Transportation Needs  (12/22/2023)   PRAPARE - Administrator, Civil Service (Medical): No    Lack of Transportation (Non-Medical): No  Physical Activity: Sufficiently Active (12/22/2023)   Exercise Vital Sign    Days of Exercise per Week: 3 days    Minutes of Exercise per Session: 80 min  Stress: No Stress Concern Present (12/22/2023)   [employer] of Occupational Health - Occupational Stress Questionnaire    Feeling of Stress : Only a little  Social Connections: Moderately Isolated (12/22/2023)   Social Connection and Isolation Panel [NHANES]    Frequency of Communication with Friends and Family: More than three times a week    Frequency of Social Gatherings with Friends and Family: Patient declined    Attends Religious Services:  Never    Active Member of Clubs or Organizations: No    Attends Banker Meetings: Never    Marital Status: Married  Catering manager Violence: Not At Risk (09/14/2023)   Humiliation, Afraid, Rape, and Kick questionnaire    Fear of Current or Ex-Partner: No    Emotionally Abused: No    Physically Abused: No    Sexually Abused: No   Family Status  Relation Name Status   Mother Dot Duck Deceased at age 39   Father Primus Brookes Long Deceased   Sister Bobbye Burrow Deceased at age 55       breast ca   Mat Aunt Terrence Ferron (Not Specified)   MGM Isabelle Maple (Not Specified)   MGF Dorna Gasman (Not Specified)   PGF Brett Camera (Not Specified)   Daughter Jeffrie Minion (Not Specified)   Neg Hx  (Not Specified)  No partnership data on file   Family History  Problem Relation Age of Onset   Heart failure Mother        CHF   Hyperlipidemia Mother    Aortic stenosis Mother    Arthritis Mother    Heart disease Mother    Lung cancer Father 32   Cancer Father    Early death Father    Breast cancer Sister 87   Cancer Sister 58   Depression Sister    Stroke Maternal Aunt    Varicose Veins Maternal Aunt    Diabetes Maternal Grandmother     Hyperlipidemia Maternal Grandmother    Colon cancer Maternal Grandfather    Cancer Maternal Grandfather        colon cancer   Cancer Paternal Grandfather        unknown type cancer   Drug abuse Daughter    Heart attack Neg Hx    Hypertension Neg Hx    Sudden death Neg Hx    Esophageal cancer Neg Hx    Stomach cancer Neg Hx    Rectal cancer Neg Hx    Allergies  Allergen Reactions   Statins Other (See Comments)    Myalgia    Augmentin  [Amoxicillin -Pot Clavulanate] Nausea And Vomiting   Moxifloxacin Nausea And Vomiting      Review of Systems  Constitutional:  Negative for fever and malaise/fatigue.  HENT:  Positive for congestion and sore throat. Negative for sinus pain.   Eyes:  Negative for blurred vision.  Respiratory:  Positive for cough and wheezing. Negative for shortness of breath.   Cardiovascular:  Negative for chest pain, palpitations and leg swelling.  Gastrointestinal:  Negative for abdominal pain, blood in stool and nausea.  Genitourinary:  Negative for dysuria and frequency.  Musculoskeletal:  Negative for falls.  Skin:  Negative for rash.  Neurological:  Negative for dizziness, loss of consciousness and headaches.  Endo/Heme/Allergies:  Negative for environmental allergies.  Psychiatric/Behavioral:  Negative for depression. The patient is not nervous/anxious.       Objective:     BP 110/70 (BP Location: Right Arm, Patient Position: Sitting, Cuff Size: Small)   Pulse 89   Temp 97.8 F (36.6 C) (Oral)   Resp 18   Ht 5\' 4"  (1.626 m)   Wt 119 lb 12.8 oz (54.3 kg)   SpO2 97%   BMI 20.56 kg/m  BP Readings from Last 3 Encounters:  04/01/24 110/70  03/28/24 122/70  03/13/24 (!) 146/89   Wt Readings from Last 3 Encounters:  04/01/24 119 lb 12.8 oz (54.3 kg)  03/28/24 121 lb  8 oz (55.1 kg)  03/07/24 118 lb 3.2 oz (53.6 kg)   SpO2 Readings from Last 3 Encounters:  04/01/24 97%  03/28/24 95%  03/07/24 96%      Physical Exam Vitals and nursing  note reviewed.  Constitutional:      General: She is not in acute distress.    Appearance: Normal appearance. She is well-developed.  HENT:     Head: Normocephalic and atraumatic.     Nose: Congestion present.  Eyes:     General: No scleral icterus.       Right eye: No discharge.        Left eye: No discharge.  Cardiovascular:     Rate and Rhythm: Normal rate and regular rhythm.     Heart sounds: No murmur heard. Pulmonary:     Effort: Pulmonary effort is normal. No respiratory distress.     Breath sounds: Wheezing present.  Musculoskeletal:        General: Normal range of motion.     Cervical back: Normal range of motion and neck supple.     Right lower leg: No edema.     Left lower leg: No edema.  Lymphadenopathy:     Cervical: Cervical adenopathy present.  Skin:    General: Skin is warm and dry.  Neurological:     Mental Status: She is alert and oriented to person, place, and time.  Psychiatric:        Mood and Affect: Mood normal.        Behavior: Behavior normal.        Thought Content: Thought content normal.        Judgment: Judgment normal.      No results found for any visits on 04/01/24.                                                                                                                                      Last CBC Lab Results  Component Value Date   WBC 6.3 03/28/2024   HGB 13.2 03/28/2024   HCT 38.1 03/28/2024   MCV 87.2 03/28/2024   MCH 30.2 03/28/2024   RDW 12.8 03/28/2024   PLT 179 03/28/2024   Last metabolic panel Lab Results  Component Value Date   GLUCOSE 88 03/28/2024   NA 137 03/28/2024   K 3.9 03/28/2024   CL 104 03/28/2024   CO2 27 03/28/2024   BUN 19 03/28/2024   CREATININE 1.00 03/28/2024   GFRNONAA 58 (L) 03/28/2024   CALCIUM  9.4 03/28/2024   PROT 7.2 03/28/2024   ALBUMIN 4.2 03/28/2024   BILITOT 0.5 03/28/2024   ALKPHOS 54 03/28/2024   AST 25 03/28/2024   ALT 14 03/28/2024   ANIONGAP 6 03/28/2024   Last  lipids Lab Results  Component Value Date   CHOL 193 01/15/2024   HDL 63.50 01/15/2024   LDLCALC 113 (H) 01/15/2024   LDLDIRECT 176.2 08/12/2013  TRIG 83.0 01/15/2024   CHOLHDL 3 01/15/2024   Last hemoglobin A1c Lab Results  Component Value Date   HGBA1C 5.1 08/04/2017   Last thyroid  functions Lab Results  Component Value Date   TSH 1.31 05/15/2023   T4TOTAL 7.6 05/15/2023   Last vitamin D  Lab Results  Component Value Date   VD25OH 87.21 08/02/2022   Last vitamin B12 and Folate No results found for: "VITAMINB12", "FOLATE"    The 10-year ASCVD risk score (Arnett DK, et al., 2019) is: 34.9%    Assessment & Plan:   Problem List Items Addressed This Visit       Unprioritized   Pulmonary emphysema, unspecified emphysema type (HCC)   Relevant Medications   predniSONE  (DELTASONE ) 10 MG tablet   promethazine -dextromethorphan (PROMETHAZINE -DM) 6.25-15 MG/5ML syrup   azithromycin  (ZITHROMAX  Z-PAK) 250 MG tablet   Rheumatoid arthritis involving multiple sites with positive rheumatoid factor (HCC)   Relevant Medications   predniSONE  (DELTASONE ) 10 MG tablet   Presence of heart assist device (HCC)   Other Visit Diagnoses       Bronchitis    -  Primary   Relevant Medications   predniSONE  (DELTASONE ) 10 MG tablet   promethazine -dextromethorphan (PROMETHAZINE -DM) 6.25-15 MG/5ML syrup   azithromycin  (ZITHROMAX  Z-PAK) 250 MG tablet   methylPREDNISolone  acetate (DEPO-MEDROL ) injection 80 mg (Completed)   Other Relevant Orders   DG Chest 2 View (Completed)     Assessment and Plan Assessment & Plan Acute Bronchitis   Acute bronchitis began last Wednesday, presenting with chest tightness, cough with green-yellow sputum, and mild intermittent fever. Symptoms and time course suggest a viral cause, though a bacterial infection is possible. Administer a steroid injection to improve lung function. Prescribe antibiotics for potential bacterial involvement and cough medicine for  symptom relief. Start prednisone  the following day. Order a chest x-ray to assess lung condition. Advise her to call if symptoms do not improve in a few days.  Shoulder Pain   Shoulder pain, likely due to arthritis, worsens with weather changes. No acute injury is reported. Continue Tylenol  for pain management.    Return if symptoms worsen or fail to improve.    Jadrien Narine R Lowne Chase, DO

## 2024-04-02 ENCOUNTER — Ambulatory Visit: Payer: Self-pay

## 2024-04-02 ENCOUNTER — Encounter: Payer: Self-pay | Admitting: Dermatology

## 2024-04-08 ENCOUNTER — Ambulatory Visit: Admitting: Dermatology

## 2024-04-08 ENCOUNTER — Encounter: Payer: Self-pay | Admitting: Dermatology

## 2024-04-08 VITALS — BP 146/81 | HR 78 | Temp 97.9°F

## 2024-04-08 DIAGNOSIS — C44519 Basal cell carcinoma of skin of other part of trunk: Secondary | ICD-10-CM

## 2024-04-08 DIAGNOSIS — C4491 Basal cell carcinoma of skin, unspecified: Secondary | ICD-10-CM

## 2024-04-08 DIAGNOSIS — L988 Other specified disorders of the skin and subcutaneous tissue: Secondary | ICD-10-CM | POA: Diagnosis not present

## 2024-04-08 NOTE — Progress Notes (Signed)
 Follow-Up Visit   Subjective  Samantha Miranda is a 78 y.o. female who presents for the following: Excision of a nodular and superficial Basal Cell Carcinoma of the right chest, biopsied by Dr. Fain Home.   The following portions of the chart were reviewed this encounter and updated as appropriate: medications, allergies, medical history  Review of Systems:  No other skin or systemic complaints except as noted in HPI or Assessment and Plan.  Objective  Well appearing patient in no apparent distress; mood and affect are within normal limits.  A focused examination was performed of the following areas: Right chest Relevant physical exam findings are noted in the Assessment and Plan.      Right chest Healing biopsy site  Assessment & Plan   BASAL CELL CARCINOMA (BCC), UNSPECIFIED SITE Right chest Skin excision  Excision method:  elliptical Lesion length (cm):  1.1 Lesion width (cm):  0.8 Margin per side (cm):  0.5 Total excision diameter (cm):  2.1 Informed consent: discussed and consent obtained   Timeout: patient name, date of birth, surgical site, and procedure verified   Procedure prep:  Patient was prepped and draped in usual sterile fashion Prep type:  Chlorhexidine  Anesthesia: the lesion was anesthetized in a standard fashion   Anesthetic:  1% lidocaine  w/ epinephrine  1-100,000 buffered w/ 8.4% NaHCO3 Instrument used: #15 blade   Hemostasis achieved with: suture, pressure and electrodesiccation   Outcome: patient tolerated procedure well with no complications   Post-procedure details: sterile dressing applied and wound care instructions given   Dressing type: bandage and pressure dressing    Skin repair Complexity:  Complex Final length (cm):  4 Informed consent: discussed and consent obtained   Timeout: patient name, date of birth, surgical site, and procedure verified   Procedure prep:  Patient was prepped and draped in usual sterile fashion Prep type:   Chlorhexidine  Anesthesia: the lesion was anesthetized in a standard fashion   Anesthetic:  1% lidocaine  w/ epinephrine  1-100,000 buffered w/ 8.4% NaHCO3 Reason for type of repair: reduce tension to allow closure, preserve normal anatomy, preserve normal anatomical and functional relationships and avoid adjacent structures   Undermining: area extensively undermined   Subcutaneous layers (deep stitches):  Suture size:  4-0 Suture type: Monocryl (poliglecaprone 25)   Stitches:  Buried vertical mattress Fine/surface layer approximation (top stitches):  Suture type: cyanoacrylate tissue glue   Hemostasis achieved with: suture, pressure and electrodesiccation Outcome: patient tolerated procedure well with no complications   Post-procedure details: sterile dressing applied and wound care instructions given   Dressing type: bandage and pressure dressing   Specimen 1 - Surgical pathology Differential Diagnosis: BCC [phone] Check Margins: No  The surgical wound was then cleaned, prepped, and re-anesthetized as above. Wound edges were undermined extensively along at least one entire edge and at a distance equal to or greater than the width of the defect (see wound defect size above) in order to achieve closure and decrease wound tension and anatomic distortion. Redundant tissue repair including standing cone removal was performed. Hemostasis was achieved with electrocautery. Subcutaneous and epidermal tissues were approximated with the above sutures. The surgical site was then lightly scrubbed with sterile, saline-soaked gauze. Steri-strips were applied, and the area was then bandaged using Vaseline ointment, non-adherent gauze, gauze pads, and tape to provide an adequate pressure dressing. The patient tolerated the procedure well, was given detailed written and verbal wound care instructions, and was discharged in good condition.   The patient will follow-up: 6  months for TBSE.  Return for 6  month TBSE.  I, Haig Levan, Surg Tech III, am acting as scribe for Deneise Finlay, MD.   Documentation: I have reviewed the above documentation for accuracy and completeness, and I agree with the above.  Deneise Finlay, MD

## 2024-04-08 NOTE — Patient Instructions (Signed)

## 2024-04-09 DIAGNOSIS — M79643 Pain in unspecified hand: Secondary | ICD-10-CM | POA: Diagnosis not present

## 2024-04-09 DIAGNOSIS — H04129 Dry eye syndrome of unspecified lacrimal gland: Secondary | ICD-10-CM | POA: Diagnosis not present

## 2024-04-09 DIAGNOSIS — M0579 Rheumatoid arthritis with rheumatoid factor of multiple sites without organ or systems involvement: Secondary | ICD-10-CM | POA: Diagnosis not present

## 2024-04-09 DIAGNOSIS — M7989 Other specified soft tissue disorders: Secondary | ICD-10-CM | POA: Diagnosis not present

## 2024-04-09 DIAGNOSIS — Z79899 Other long term (current) drug therapy: Secondary | ICD-10-CM | POA: Diagnosis not present

## 2024-04-09 DIAGNOSIS — M1990 Unspecified osteoarthritis, unspecified site: Secondary | ICD-10-CM | POA: Diagnosis not present

## 2024-04-10 LAB — SURGICAL PATHOLOGY

## 2024-04-11 ENCOUNTER — Ambulatory Visit: Payer: Self-pay | Admitting: Dermatology

## 2024-04-16 ENCOUNTER — Other Ambulatory Visit: Payer: Self-pay | Admitting: Family Medicine

## 2024-04-16 DIAGNOSIS — J40 Bronchitis, not specified as acute or chronic: Secondary | ICD-10-CM

## 2024-04-17 NOTE — Addendum Note (Signed)
 Addended by: Lott Rouleau A on: 04/17/2024 02:41 PM   Modules accepted: Orders

## 2024-04-17 NOTE — Progress Notes (Signed)
 Remote pacemaker transmission.

## 2024-04-22 ENCOUNTER — Encounter: Payer: Self-pay | Admitting: Family Medicine

## 2024-04-22 MED ORDER — DOXYCYCLINE HYCLATE 100 MG PO TABS: 100.0000 mg | ORAL_TABLET | Freq: Two times a day (BID) | ORAL | 0 refills | Status: DC

## 2024-05-02 DIAGNOSIS — D23122 Other benign neoplasm of skin of left lower eyelid, including canthus: Secondary | ICD-10-CM | POA: Diagnosis not present

## 2024-05-02 DIAGNOSIS — H26491 Other secondary cataract, right eye: Secondary | ICD-10-CM | POA: Diagnosis not present

## 2024-05-02 DIAGNOSIS — H04123 Dry eye syndrome of bilateral lacrimal glands: Secondary | ICD-10-CM | POA: Diagnosis not present

## 2024-05-02 DIAGNOSIS — H43813 Vitreous degeneration, bilateral: Secondary | ICD-10-CM | POA: Diagnosis not present

## 2024-05-03 ENCOUNTER — Ambulatory Visit
Admission: RE | Admit: 2024-05-03 | Discharge: 2024-05-03 | Disposition: A | Discharge status: Home / Self Care | Source: Ambulatory Visit | Attending: Acute Care | Admitting: Acute Care

## 2024-05-03 DIAGNOSIS — Z122 Encounter for screening for malignant neoplasm of respiratory organs: Secondary | ICD-10-CM

## 2024-05-03 DIAGNOSIS — Z87891 Personal history of nicotine dependence: Secondary | ICD-10-CM

## 2024-05-03 DIAGNOSIS — R918 Other nonspecific abnormal finding of lung field: Secondary | ICD-10-CM | POA: Diagnosis not present

## 2024-05-15 ENCOUNTER — Encounter: Payer: Self-pay | Admitting: Family Medicine

## 2024-05-16 DIAGNOSIS — H26491 Other secondary cataract, right eye: Secondary | ICD-10-CM | POA: Diagnosis not present

## 2024-06-05 ENCOUNTER — Ambulatory Visit: Payer: Medicare PPO

## 2024-06-05 DIAGNOSIS — I442 Atrioventricular block, complete: Secondary | ICD-10-CM | POA: Diagnosis not present

## 2024-06-05 LAB — CUP PACEART REMOTE DEVICE CHECK
Battery Remaining Longevity: 152 mo
Battery Voltage: 3.19 V
Brady Statistic AP VP Percent: 0.71 %
Brady Statistic AP VS Percent: 0 %
Brady Statistic AS VP Percent: 99.28 %
Brady Statistic AS VS Percent: 0.01 %
Brady Statistic RA Percent Paced: 0.71 %
Brady Statistic RV Percent Paced: 99.99 %
Date Time Interrogation Session: 20250715203310
Implantable Lead Connection Status: 753985
Implantable Lead Connection Status: 753985
Implantable Lead Implant Date: 20250114
Implantable Lead Implant Date: 20250114
Implantable Lead Location: 753859
Implantable Lead Location: 753860
Implantable Lead Model: 3830
Implantable Lead Model: 5076
Implantable Pulse Generator Implant Date: 20250114
Lead Channel Impedance Value: 342 Ohm
Lead Channel Impedance Value: 418 Ohm
Lead Channel Impedance Value: 589 Ohm
Lead Channel Impedance Value: 608 Ohm
Lead Channel Pacing Threshold Amplitude: 0.625 V
Lead Channel Pacing Threshold Amplitude: 0.625 V
Lead Channel Pacing Threshold Pulse Width: 0.4 ms
Lead Channel Pacing Threshold Pulse Width: 0.4 ms
Lead Channel Sensing Intrinsic Amplitude: 2.5 mV
Lead Channel Sensing Intrinsic Amplitude: 2.5 mV
Lead Channel Sensing Intrinsic Amplitude: 29.75 mV
Lead Channel Sensing Intrinsic Amplitude: 31.625 mV
Lead Channel Setting Pacing Amplitude: 1.5 V
Lead Channel Setting Pacing Amplitude: 2 V
Lead Channel Setting Pacing Pulse Width: 0.4 ms
Lead Channel Setting Sensing Sensitivity: 2.8 mV
Zone Setting Status: 755011
Zone Setting Status: 755011

## 2024-06-06 ENCOUNTER — Ambulatory Visit: Payer: Self-pay | Admitting: Cardiology

## 2024-06-07 DIAGNOSIS — H26493 Other secondary cataract, bilateral: Secondary | ICD-10-CM | POA: Diagnosis not present

## 2024-06-28 ENCOUNTER — Other Ambulatory Visit: Payer: Self-pay | Admitting: Family Medicine

## 2024-06-28 DIAGNOSIS — I10 Essential (primary) hypertension: Secondary | ICD-10-CM

## 2024-07-12 ENCOUNTER — Ambulatory Visit: Admitting: Family Medicine

## 2024-07-12 ENCOUNTER — Ambulatory Visit (HOSPITAL_BASED_OUTPATIENT_CLINIC_OR_DEPARTMENT_OTHER)
Admission: RE | Admit: 2024-07-12 | Discharge: 2024-07-12 | Disposition: A | Discharge status: Home / Self Care | Source: Ambulatory Visit | Attending: Family Medicine | Admitting: Family Medicine

## 2024-07-12 ENCOUNTER — Ambulatory Visit: Payer: Self-pay

## 2024-07-12 ENCOUNTER — Ambulatory Visit: Payer: Self-pay | Admitting: Family Medicine

## 2024-07-12 ENCOUNTER — Other Ambulatory Visit (HOSPITAL_BASED_OUTPATIENT_CLINIC_OR_DEPARTMENT_OTHER): Payer: Self-pay

## 2024-07-12 VITALS — BP 120/72 | HR 81 | Temp 98.1°F | Resp 18 | Ht 64.0 in | Wt 119.4 lb

## 2024-07-12 DIAGNOSIS — R0602 Shortness of breath: Secondary | ICD-10-CM

## 2024-07-12 DIAGNOSIS — R0789 Other chest pain: Secondary | ICD-10-CM | POA: Diagnosis not present

## 2024-07-12 DIAGNOSIS — R079 Chest pain, unspecified: Secondary | ICD-10-CM | POA: Diagnosis not present

## 2024-07-12 DIAGNOSIS — J40 Bronchitis, not specified as acute or chronic: Secondary | ICD-10-CM | POA: Diagnosis not present

## 2024-07-12 DIAGNOSIS — J439 Emphysema, unspecified: Secondary | ICD-10-CM

## 2024-07-12 DIAGNOSIS — C50919 Malignant neoplasm of unspecified site of unspecified female breast: Secondary | ICD-10-CM | POA: Diagnosis not present

## 2024-07-12 LAB — CBC WITH DIFFERENTIAL/PLATELET
Basophils Absolute: 0.1 K/uL (ref 0.0–0.1)
Basophils Relative: 1.1 % (ref 0.0–3.0)
Eosinophils Absolute: 0.2 K/uL (ref 0.0–0.7)
Eosinophils Relative: 2.7 % (ref 0.0–5.0)
HCT: 38.6 % (ref 36.0–46.0)
Hemoglobin: 13 g/dL (ref 12.0–15.0)
Lymphocytes Relative: 31.1 % (ref 12.0–46.0)
Lymphs Abs: 1.9 K/uL (ref 0.7–4.0)
MCHC: 33.8 g/dL (ref 30.0–36.0)
MCV: 91.4 fl (ref 78.0–100.0)
Monocytes Absolute: 0.5 K/uL (ref 0.1–1.0)
Monocytes Relative: 7.9 % (ref 3.0–12.0)
Neutro Abs: 3.4 K/uL (ref 1.4–7.7)
Neutrophils Relative %: 57.2 % (ref 43.0–77.0)
Platelets: 204 K/uL (ref 150.0–400.0)
RBC: 4.22 Mil/uL (ref 3.87–5.11)
RDW: 13.3 % (ref 11.5–15.5)
WBC: 6 K/uL (ref 4.0–10.5)

## 2024-07-12 LAB — COMPREHENSIVE METABOLIC PANEL WITH GFR
ALT: 13 U/L (ref 0–35)
AST: 22 U/L (ref 0–37)
Albumin: 4 g/dL (ref 3.5–5.2)
Alkaline Phosphatase: 51 U/L (ref 39–117)
BUN: 17 mg/dL (ref 6–23)
CO2: 27 meq/L (ref 19–32)
Calcium: 9 mg/dL (ref 8.4–10.5)
Chloride: 103 meq/L (ref 96–112)
Creatinine, Ser: 0.83 mg/dL (ref 0.40–1.20)
GFR: 68.04 mL/min (ref >60.00)
Glucose, Bld: 87 mg/dL (ref 70–99)
Potassium: 3.9 meq/L (ref 3.5–5.1)
Sodium: 139 meq/L (ref 135–145)
Total Bilirubin: 0.5 mg/dL (ref 0.2–1.2)
Total Protein: 6.6 g/dL (ref 6.0–8.3)

## 2024-07-12 LAB — LIPID PANEL
Cholesterol: 186 mg/dL (ref 0–200)
HDL: 65.6 mg/dL (ref >39.00)
LDL Cholesterol: 105 mg/dL — ABNORMAL HIGH (ref 0–99)
NonHDL: 119.96
Total CHOL/HDL Ratio: 3
Triglycerides: 75 mg/dL (ref 0.0–149.0)
VLDL: 15 mg/dL (ref 0.0–40.0)

## 2024-07-12 LAB — D-DIMER, QUANTITATIVE: D-Dimer, Quant: 0.72 ug{FEU}/mL — ABNORMAL HIGH (ref <0.50)

## 2024-07-12 LAB — SEDIMENTATION RATE: Sed Rate: 17 mm/h (ref 0–30)

## 2024-07-12 LAB — TSH: TSH: 1.29 u[IU]/mL (ref 0.35–5.50)

## 2024-07-12 LAB — TROPONIN I (HIGH SENSITIVITY): High Sens Troponin I: 6 ng/L (ref 2–17)

## 2024-07-12 MED ORDER — ALBUTEROL SULFATE HFA 108 (90 BASE) MCG/ACT IN AERS
2.0000 | INHALATION_SPRAY | Freq: Four times a day (QID) | RESPIRATORY_TRACT | 0 refills | Status: AC | PRN
  Filled 2024-07-12: qty 6.7, 30d supply, fill #0

## 2024-07-12 MED ORDER — IPRATROPIUM-ALBUTEROL 0.5-2.5 (3) MG/3ML IN SOLN
3.0000 mL | Freq: Once | RESPIRATORY_TRACT | Status: AC
  Administered 2024-07-12: 3 mL via RESPIRATORY_TRACT

## 2024-07-12 MED ORDER — PREDNISONE 10 MG PO TABS
ORAL_TABLET | ORAL | 0 refills | Status: AC
  Filled 2024-07-12: qty 20, 12d supply, fill #0

## 2024-07-12 MED ORDER — BREZTRI AEROSPHERE 160-9-4.8 MCG/ACT IN AERO
2.0000 | INHALATION_SPRAY | Freq: Two times a day (BID) | RESPIRATORY_TRACT | 11 refills | Status: DC
  Filled 2024-07-12: qty 10.7, 30d supply, fill #0

## 2024-07-12 MED ORDER — AZITHROMYCIN 250 MG PO TABS
ORAL_TABLET | ORAL | 0 refills | Status: DC
  Filled 2024-07-12: qty 6, 5d supply, fill #0

## 2024-07-12 NOTE — Assessment & Plan Note (Signed)
 Some relief with neb Check labs  If pain returns go to ER EKG --- sinus, no change from previous

## 2024-07-12 NOTE — Patient Instructions (Signed)
Nonspecific Chest Pain, Adult Chest pain is an uncomfortable, tight, or painful feeling in the chest. The pain can feel like a crushing, aching, or squeezing pressure. A person can feel a burning or tingling sensation. Chest pain can also be felt in your back, neck, jaw, shoulder, or arm. This pain can be worse when you move, sneeze, or take a deep breath. Chest pain can be caused by a condition that is life-threatening. This must be treated right away. It can also be caused by something that is not life-threatening. If you have chest pain, it can be hard to know the difference, so it is important to get help right away to make sure that you do not have a serious condition. Some life-threatening causes of chest pain include: Heart attack. A tear in the body's main blood vessel (aortic dissection). Inflammation around your heart (pericarditis). A problem in the lungs, such as a blood clot (pulmonary embolism) or a collapsed lung (pneumothorax). Some non life-threatening causes of chest pain include: Heartburn. Anxiety or stress. Damage to the bones, muscles, and cartilage that make up your chest wall. Pneumonia or bronchitis. Shingles infection (varicella-zoster virus). Your chest pain may come and go. It may also be constant. Your health care provider will do tests and other studies to find the cause of your pain. Treatment will depend on the cause of your chest pain. Follow these instructions at home: Medicines Take over-the-counter and prescription medicines only as told by your health care provider. If you were prescribed an antibiotic medicine, take it as told by your health care provider. Do not stop taking the antibiotic even if you start to feel better. Activity Avoid any activities that cause chest pain. Do not lift anything that is heavier than 10 lb (4.5 kg), or the limit that you are told, until your health care provider says that it is safe. Rest as directed by your health care  provider. Return to your normal activities only as told by your health care provider. Ask your health care provider what activities are safe for you. Lifestyle     Do not use any products that contain nicotine or tobacco, such as cigarettes, e-cigarettes, and chewing tobacco. If you need help quitting, ask your health care provider. Do not drink alcohol. Make healthy lifestyle changes as recommended. These may include: Getting regular exercise. Ask your health care provider to suggest some exercises that are safe for you. Eating a heart-healthy diet. This includes plenty of fresh fruits and vegetables, whole grains, low-fat (lean) protein, and low-fat dairy products. A dietitian can help you find healthy eating options. Maintaining a healthy weight. Managing any other health conditions you may have, such as high blood pressure (hypertension) or diabetes. Reducing stress, such as with yoga or relaxation techniques. General instructions Pay attention to any changes in your symptoms. It is up to you to get the results of any tests that were done. Ask your health care provider, or the department that is doing the tests, when your results will be ready. Keep all follow-up visits as told by your health care provider. This is important. You may be asked to go for further testing if your chest pain does not go away. Contact a health care provider if: Your chest pain does not go away. You feel depressed. You have a fever. You notice changes in your symptoms or develop new symptoms. Get help right away if: Your chest pain gets worse. You have a cough that gets worse, or you  cough up blood. You have severe pain in your abdomen. You faint. You have sudden, unexplained chest discomfort. You have sudden, unexplained discomfort in your arms, back, neck, or jaw. You have shortness of breath at any time. You suddenly start to sweat, or your skin gets clammy. You feel nausea or you vomit. You  suddenly feel lightheaded or dizzy. You have severe weakness, or unexplained weakness or fatigue. Your heart begins to beat quickly, or it feels like it is skipping beats. These symptoms may represent a serious problem that is an emergency. Do not wait to see if the symptoms will go away. Get medical help right away. Call your local emergency services (911 in the [place] Do not drive yourself to the hospital. Summary Chest pain can be caused by a condition that is serious and requires urgent treatment. It may also be caused by something that is not life-threatening. Your health care provider may do lab tests and other studies to find the cause of your pain. Follow your health care provider's instructions on taking medicines, making lifestyle changes, and getting emergency treatment if symptoms become worse. Keep all follow-up visits as told by your health care provider. This includes visits for any further testing if your chest pain does not go away. This information is not intended to replace advice given to you by your health care provider. Make sure you discuss any questions you have with your health care provider. Document Revised: 09/22/2022 Document Reviewed: 09/22/2022 Elsevier Patient Education  2024 [employer].

## 2024-07-12 NOTE — Progress Notes (Signed)
 D dimer elevated but adjusted for age is ok.   If pt still has cp she should go to er

## 2024-07-12 NOTE — Progress Notes (Signed)
 Subjective:    Patient ID: Samantha Miranda, female    DOB: [birth year], 78 y.o.   MRN: [id number]  Chief Complaint  Patient presents with   Shortness of Breath    Pt states having SOB n chest pain, sxs started 1 week ago.     HPI Patient is in today for cough, shortness of breath.   Discussed the use of AI scribe software for clinical note transcription with the patient, who gave verbal consent to proceed.  History of Present Illness Samantha Miranda is a 78 year old female who presents with chest pain and shortness of breath.  She has been experiencing severe chest pain for about a week, described as a 'band' around her chest and back. The pain is constant and exacerbated by sneezing, which she describes as feeling like her chest is going to explode. Sitting up slightly alleviates the discomfort, but it remains uncomfortable even when sitting still.  She also experiences shortness of breath, particularly when climbing stairs or taking a bath, which has been occurring for the same duration as the chest pain. She often feels out of breath and tightness in her chest, making it difficult to cough. No sputum production is noted, and she finds it hard to cough due to chest tightness.  She has a history of arthritis, which she feels has been aggravated by her current symptoms, making it difficult for her to lift her arms.  A lung scan in June did not reveal any significant findings. She uses an Advair inhaler twice daily, which provides minimal relief.    Past Medical History:  Diagnosis Date   Barrett's esophagus    Basal cell carcinoma    multiple of face - some treated with Mohs at [facility]   Basal cell carcinoma of left lower leg    Breast cancer (HCC)    right   COPD (chronic obstructive pulmonary disease) (HCC) 2018   Depression 1989   Gastric ulcer    GERD (gastroesophageal reflux disease)    History of colon polyps    Hyperlipidemia    Osteopenia     Past  Surgical History:  Procedure Laterality Date   ABDOMINAL HYSTERECTOMY  11/21/1989   APPENDECTOMY  1969   BREAST LUMPECTOMY  07/22/2006   BREAST LUMPECTOMY WITH RADIOACTIVE SEED AND SENTINEL LYMPH NODE BIOPSY Right 07/02/2021   Procedure: RIGHT BREAST LUMPECTOMY WITH RADIOACTIVE SEED AND RIGHT AXILLARY SENTINEL LYMPH NODE BIOPSY;  Surgeon: Ebbie Cough, MD;  Location: [facility] OR;  Service: General;  Laterality: Right;   CATARACT EXTRACTION, BILATERAL     EYE SURGERY  08/2013  10/2013   cataracts   PACEMAKER IMPLANT N/A 12/05/2023   Procedure: PACEMAKER IMPLANT;  Surgeon: Inocencio Soyla Lunger, MD;  Location: [facility] INVASIVE CV LAB;  Service: Cardiovascular;  Laterality: N/A;   TONSILLECTOMY      Family History  Problem Relation Age of Onset   Heart failure Mother        CHF   Hyperlipidemia Mother    Aortic stenosis Mother    Arthritis Mother    Heart disease Mother    Lung cancer Father 57   Cancer Father    Early death Father    Breast cancer Sister 33   Cancer Sister 40   Depression Sister    Stroke Maternal Aunt    Varicose Veins Maternal Aunt    Diabetes Maternal Grandmother    Hyperlipidemia Maternal Grandmother    Colon cancer Maternal Grandfather  Cancer Maternal Grandfather        colon cancer   Cancer Paternal Grandfather        unknown type cancer   Drug abuse Daughter    Heart attack Neg Hx    Hypertension Neg Hx    Sudden death Neg Hx    Esophageal cancer Neg Hx    Stomach cancer Neg Hx    Rectal cancer Neg Hx     Social History   Socioeconomic History   Marital status: Married    Spouse name: Not on file   Number of children: 1   Years of education: Not on file   Highest education level: Some college, no degree  Occupational History   Occupation: retired    Associate Professor: [employer]    Comment: retired  Tobacco Use   Smoking status: Former    Current packs/day: 0.00    Average packs/day: 0.5 packs/day for 40.0 years (20.0 ttl pk-yrs)     Types: Cigarettes, E-cigarettes    Start date: 07/23/1971    Quit date: 07/23/2011    Years since quitting: 12.9   Smokeless tobacco: Never   Tobacco comments:    The last 5 yrs. I smoked .5 pack a day. Before that, 1 pk. a day  Vaping Use   Vaping status: Former   Substances: Nicotine  Substance and Sexual Activity   Alcohol use: Yes    Comment: occasional wine   Drug use: No   Sexual activity: Not Currently    Partners: Male    Birth control/protection: Post-menopausal  Other Topics Concern   Not on file  Social History Narrative   Exercise-virtual class, MWF   Social Drivers of Corporate investment banker Strain: Low Risk  (07/12/2024)   Overall Financial Resource Strain (CARDIA)    Difficulty of Paying Living Expenses: Not hard at all  Food Insecurity: No Food Insecurity (07/12/2024)   Hunger Vital Sign    Worried About Running Out of Food in the Last Year: Never true    Ran Out of Food in the Last Year: Never true  Transportation Needs: No Transportation Needs (07/12/2024)   PRAPARE - Administrator, Civil Service (Medical): No    Lack of Transportation (Non-Medical): No  Physical Activity: Insufficiently Active (07/12/2024)   Exercise Vital Sign    Days of Exercise per Week: 3 days    Minutes of Exercise per Session: 20 min  Stress: Stress Concern Present (07/12/2024)   [employer] of Occupational Health - Occupational Stress Questionnaire    Feeling of Stress: To some extent  Social Connections: Moderately Isolated (07/12/2024)   Social Connection and Isolation Panel    Frequency of Communication with Friends and Family: More than three times a week    Frequency of Social Gatherings with Friends and Family: Never    Attends Religious Services: Never    Database administrator or Organizations: No    Attends Engineer, structural: Not on file    Marital Status: Married  Catering manager Violence: Not At Risk (09/14/2023)   Humiliation, Afraid,  Rape, and Kick questionnaire    Fear of Current or Ex-Partner: No    Emotionally Abused: No    Physically Abused: No    Sexually Abused: No    Outpatient Medications Prior to Visit  Medication Sig Dispense Refill   acetaminophen  (TYLENOL ) 325 MG tablet Take 2 tablets (650 mg total) by mouth every 6 (six) hours as needed  for moderate pain (pain score 4-6). 30 tablet 0   Calcium -Magnesium-Zinc (CAL-MAG-ZINC PO) Take 2 tablets by mouth daily.     cholecalciferol (VITAMIN D3) 25 MCG (1000 UNIT) tablet Take 1,000 Units by mouth daily.     ezetimibe  (ZETIA ) 10 MG tablet TAKE ONE TABLET BY MOUTH ONE TIME DAILY 90 tablet 1   fluticasone  (FLONASE ) 50 MCG/ACT nasal spray Place 2 sprays into both nostrils daily. (Patient taking differently: Place 2 sprays into both nostrils daily as needed for allergies.) 16 g 5   folic acid (FOLVITE) 1 MG tablet Take 1 mg by mouth daily.     gabapentin  (NEURONTIN ) 100 MG capsule TAKE ONE CAPSULE BY MOUTH THREE TIMES A DAY (Patient taking differently: Take 100 mg by mouth at bedtime as needed (pain/RLS).) 90 capsule 0   hydroxychloroquine  (PLAQUENIL ) 200 MG tablet Take 1 tablet (200 mg total) by mouth daily. 30 tablet 5   loratadine  (CLARITIN ) 10 MG tablet Take 1 tablet (10 mg total) by mouth daily. (Patient taking differently: Take 10 mg by mouth daily as needed for allergies or rhinitis.) 30 tablet 11   losartan  (COZAAR ) 100 MG tablet TAKE ONE TABLET BY MOUTH ONE TIME DAILY 90 tablet 1   OLIVE LEAF EXTRACT PO Take 1 drop by mouth daily.     Omega-3 Fatty Acids (FISH OIL) 1200 MG CAPS Take 1,200 mg by mouth daily.     ondansetron  (ZOFRAN ) 4 MG tablet TAKE ONE TABLET BY MOUTH EVERY 8 HOURS AS NEEDED FOR NAUSEA OR VOMITING 20 tablet 0   pantoprazole  (PROTONIX ) 40 MG tablet Take 1 tablet (40 mg total) by mouth daily before breakfast. 90 tablet 1   Polyethyl Glycol-Propyl Glycol (SYSTANE ULTRA) 0.4-0.3 % SOLN Apply 1 drop to eye as needed (dry eyes).     vitamin C  (ASCORBIC ACID) 250 MG tablet Take 250 mg by mouth daily.     XIIDRA  5 % SOLN Place 1 drop into both eyes in the morning and at bedtime.  1   zolpidem  (AMBIEN ) 5 MG tablet TAKE ONE TABLET BY MOUTH AT BEDTIME AS NEEDED FOR SLEEP 30 tablet 1   fluticasone -salmeterol (ADVAIR) 100-50 MCG/ACT AEPB INHALE ONE PUFF BY MOUTH TWICE A DAY 60 each 5   azithromycin  (ZITHROMAX  Z-PAK) 250 MG tablet As directed 6 each 0   doxycycline  (VIBRA -TABS) 100 MG tablet Take 1 tablet (100 mg total) by mouth 2 (two) times daily. 14 tablet 0   predniSONE  (DELTASONE ) 10 MG tablet TAKE 3 TABLETS PO QD FOR 3 DAYS THEN TAKE 2 TABLETS PO QD FOR 3 DAYS THEN TAKE 1 TABLET PO QD FOR 3 DAYS THEN TAKE 1/2 TAB PO QD FOR 3 DAYS 20 tablet 0   promethazine -dextromethorphan (PROMETHAZINE -DM) 6.25-15 MG/5ML syrup TAKE 5 ML BY MOUTH FOUR TIMES A DAY AS NEEDED 118 mL 0   No facility-administered medications prior to visit.    Allergies  Allergen Reactions   Statins Other (See Comments)    Myalgia    Augmentin  [Amoxicillin -Pot Clavulanate] Nausea And Vomiting   Moxifloxacin Nausea And Vomiting    Review of Systems  Constitutional:  Negative for fever and malaise/fatigue.  HENT:  Negative for congestion.   Eyes:  Negative for blurred vision.  Respiratory:  Positive for cough and shortness of breath. Negative for sputum production and wheezing.   Cardiovascular:  Positive for chest pain. Negative for palpitations, leg swelling and PND.  Gastrointestinal:  Negative for vomiting.  Musculoskeletal:  Negative for back pain.  Skin:  Negative for rash.  Neurological:  Negative for loss of consciousness and headaches.       Objective:    Physical Exam Vitals and nursing note reviewed.  Constitutional:      General: She is not in acute distress.    Appearance: Normal appearance. She is well-developed.  HENT:     Head: Normocephalic and atraumatic.  Eyes:     General: No scleral icterus.       Right eye: No discharge.         Left eye: No discharge.  Cardiovascular:     Rate and Rhythm: Normal rate and regular rhythm.     Heart sounds: No murmur heard. Pulmonary:     Effort: Pulmonary effort is normal. No respiratory distress.     Breath sounds: Decreased air movement present. Decreased breath sounds present.     Comments: Neb given --- duoneb Pt with some relief of chest tightness  Musculoskeletal:        General: Normal range of motion.     Cervical back: Normal range of motion and neck supple.     Right lower leg: No edema.     Left lower leg: No edema.  Skin:    General: Skin is warm and dry.  Neurological:     Mental Status: She is alert and oriented to person, place, and time.  Psychiatric:        Mood and Affect: Mood normal.        Behavior: Behavior normal.        Thought Content: Thought content normal.        Judgment: Judgment normal.     BP 120/72 (BP Location: Left Arm, Patient Position: Sitting, Cuff Size: Normal)   Pulse 81   Temp 98.1 F (36.7 C) (Oral)   Resp 18   Ht 5' 4 (1.626 m)   Wt 119 lb 6.4 oz (54.2 kg)   SpO2 97%   BMI 20.49 kg/m  Wt Readings from Last 3 Encounters:  07/12/24 119 lb 6.4 oz (54.2 kg)  04/01/24 119 lb 12.8 oz (54.3 kg)  03/28/24 121 lb 8 oz (55.1 kg)    Diabetic Foot Exam - Simple   No data filed    Lab Results  Component Value Date   WBC 6.3 03/28/2024   HGB 13.2 03/28/2024   HCT 38.1 03/28/2024   PLT 179 03/28/2024   GLUCOSE 88 03/28/2024   CHOL 193 01/15/2024   TRIG 83.0 01/15/2024   HDL 63.50 01/15/2024   LDLDIRECT 176.2 08/12/2013   LDLCALC 113 (H) 01/15/2024   ALT 14 03/28/2024   AST 25 03/28/2024   NA 137 03/28/2024   K 3.9 03/28/2024   CL 104 03/28/2024   CREATININE 1.00 03/28/2024   BUN 19 03/28/2024   CO2 27 03/28/2024   TSH 1.31 05/15/2023   HGBA1C 5.1 08/04/2017    Lab Results  Component Value Date   TSH 1.31 05/15/2023   Lab Results  Component Value Date   WBC 6.3 03/28/2024   HGB 13.2 03/28/2024   HCT 38.1  03/28/2024   MCV 87.2 03/28/2024   PLT 179 03/28/2024   Lab Results  Component Value Date   NA 137 03/28/2024   K 3.9 03/28/2024   CO2 27 03/28/2024   GLUCOSE 88 03/28/2024   BUN 19 03/28/2024   CREATININE 1.00 03/28/2024   BILITOT 0.5 03/28/2024   ALKPHOS 54 03/28/2024   AST 25 03/28/2024   ALT 14 03/28/2024  PROT 7.2 03/28/2024   ALBUMIN 4.2 03/28/2024   CALCIUM  9.4 03/28/2024   ANIONGAP 6 03/28/2024   GFR 56.62 (L) 01/15/2024   Lab Results  Component Value Date   CHOL 193 01/15/2024   Lab Results  Component Value Date   HDL 63.50 01/15/2024   Lab Results  Component Value Date   LDLCALC 113 (H) 01/15/2024   Lab Results  Component Value Date   TRIG 83.0 01/15/2024   Lab Results  Component Value Date   CHOLHDL 3 01/15/2024   Lab Results  Component Value Date   HGBA1C 5.1 08/04/2017       Assessment & Plan:  Chest pain, unspecified type Assessment & Plan: Some relief with neb Check labs  If pain returns go to ER EKG --- sinus, no change from previous  Orders: -     EKG 12-Lead -     CBC with Differential/Platelet -     Comprehensive metabolic panel with GFR -     Lipid panel -     TSH -     Sedimentation rate -     D-dimer, quantitative -     Troponin I (High Sensitivity) -     DG Chest 2 View; Future  SOB (shortness of breath) -     DG Chest 2 View; Future -     Ipratropium-Albuterol  -     Breztri  Aerosphere; Inhale 2 puffs into the lungs 2 (two) times daily.  Dispense: 10.7 g; Refill: 11 -     Azithromycin ; Take as directed on package  Dispense: 6 each; Refill: 0  Pulmonary emphysema, unspecified emphysema type (HCC) -     Breztri  Aerosphere; Inhale 2 puffs into the lungs 2 (two) times daily.  Dispense: 10.7 g; Refill: 11 -     Albuterol  Sulfate HFA; Inhale 2 puffs into the lungs every 6 (six) hours as needed for wheezing or shortness of breath.  Dispense: 6.7 g; Refill: 0 -     Azithromycin ; Take as directed on package  Dispense: 6 each;  Refill: 0  Bronchitis -     predniSONE ; Take 3 tablets (30 mg total) by mouth daily for 3 days, THEN 2 tablets (20 mg total) daily for 3 days, THEN 1 tablet (10 mg total) daily for 3 days, THEN 0.5 tablets (5 mg total) daily for 3 days.  Dispense: 20 tablet; Refill: 0  Assessment and Plan Assessment & Plan Chest pain with shortness of breath   She has experienced acute chest pain and shortness of breath for about a week, with worsening symptoms in the last few days. The pain is described as tightness and pressure around the chest, exacerbated by sneezing and certain positions. EKG is unchanged from previous, and vital signs are stable. Breathing treatment provided slight relief. Order blood work and chest x-ray. Advise to go to the emergency room if pain worsens.  Chronic obstructive pulmonary disease (COPD)   COPD is managed with Advair inhaler twice daily. Breathing treatment provided slight improvement in symptoms. Current inhaler regimen may need adjustment. Continue Advair inhaler twice daily. Evaluate inhaler regimen for potential changes.  Osteoarthritis   Arthritis symptoms may be aggravated by recent chest pain and sneezing fits, causing difficulty in lifting arms.    Taesean Reth R Lowne Chase, DO

## 2024-07-12 NOTE — Telephone Encounter (Signed)
 FYI Only or Action Required?: FYI only for provider.  Patient was last seen in primary care on 04/01/2024 by Antonio Meth, Jamee SAUNDERS, DO.  Called Nurse Triage reporting Shortness of Breath.  Symptoms began a week ago.  Interventions attempted: Nothing.  Symptoms are: gradually worsening.  Triage Disposition: See HCP Within 4 Hours (Or PCP Triage)  Patient/caregiver understands and will follow disposition?: Yes                 Copied from CRM [id number]. Topic: Clinical - Red Word Triage >> Jul 12, 2024  7:37 AM [facility] ORN wrote: Red Word that prompted transfer to Nurse Triage: Severe Chest Pain >> Jul 12, 2024  7:38 AM [facility] ORN wrote: Patient states she's been experiencing some shortness of breath, tightness and chest pains for about a week.  Reason for Disposition  [1] MILD difficulty breathing (e.g., minimal/no SOB at rest, SOB with walking, pulse < 100) AND [2] NEW-onset or WORSE than normal  Answer Assessment - Initial Assessment Questions 1. RESPIRATORY STATUS: Describe your breathing? (e.g., wheezing, shortness of breath, unable to speak, severe coughing)      Shortness of breath 2. ONSET: When did this breathing problem begin?      About a week ago 3. PATTERN Does the difficult breathing come and go, or has it been constant since it started?      States chest hurts when she coughs or sneezes 4. SEVERITY: How bad is your breathing? (e.g., mild, moderate, severe)      moderate 5. RECURRENT SYMPTOM: Have you had difficulty breathing before? If Yes, ask: When was the last time? and What happened that time?      Hx COPD 6. CARDIAC HISTORY: Do you have any history of heart disease? (e.g., heart attack, angina, bypass surgery, angioplasty)      Pace maker 7. LUNG HISTORY: Do you have any history of lung disease?  (e.g., pulmonary embolus, asthma, emphysema)     copd 8. CAUSE: What do you think is causing the breathing problem?       infection 9. OTHER SYMPTOMS: Do you have any other symptoms? (e.g., chest pain, cough, dizziness, fever, runny nose)     Sneezing, cough 10. O2 SATURATION MONITOR:  Do you use an oxygen saturation monitor (pulse oximeter) at home? If Yes, ask: What is your reading (oxygen level) today? What is your usual oxygen saturation reading? (e.g., 95%)       na 11. PREGNANCY: Is there any chance you are pregnant? When was your last menstrual period?       na 12. TRAVEL: Have you traveled out of the country in the last month? (e.g., travel history, exposures)       no  Protocols used: Breathing Difficulty-A-AH

## 2024-07-15 DIAGNOSIS — Z1231 Encounter for screening mammogram for malignant neoplasm of breast: Secondary | ICD-10-CM | POA: Diagnosis not present

## 2024-07-15 LAB — HM MAMMOGRAPHY

## 2024-07-18 ENCOUNTER — Encounter: Payer: Self-pay | Admitting: Family Medicine

## 2024-07-19 ENCOUNTER — Other Ambulatory Visit: Payer: Self-pay | Admitting: Family Medicine

## 2024-07-19 DIAGNOSIS — J40 Bronchitis, not specified as acute or chronic: Secondary | ICD-10-CM

## 2024-08-01 ENCOUNTER — Ambulatory Visit (HOSPITAL_BASED_OUTPATIENT_CLINIC_OR_DEPARTMENT_OTHER)
Admission: RE | Admit: 2024-08-01 | Discharge: 2024-08-01 | Disposition: A | Discharge status: Home / Self Care | Source: Ambulatory Visit | Attending: Family Medicine | Admitting: Family Medicine

## 2024-08-01 ENCOUNTER — Other Ambulatory Visit (HOSPITAL_BASED_OUTPATIENT_CLINIC_OR_DEPARTMENT_OTHER): Payer: Self-pay

## 2024-08-01 ENCOUNTER — Encounter: Payer: Self-pay | Admitting: Family Medicine

## 2024-08-01 ENCOUNTER — Ambulatory Visit: Admitting: Family Medicine

## 2024-08-01 VITALS — BP 144/78 | HR 87 | Temp 97.7°F | Ht 64.0 in | Wt 120.0 lb

## 2024-08-01 DIAGNOSIS — R918 Other nonspecific abnormal finding of lung field: Secondary | ICD-10-CM | POA: Diagnosis not present

## 2024-08-01 DIAGNOSIS — R051 Acute cough: Secondary | ICD-10-CM | POA: Diagnosis not present

## 2024-08-01 DIAGNOSIS — J0140 Acute pansinusitis, unspecified: Secondary | ICD-10-CM | POA: Diagnosis not present

## 2024-08-01 DIAGNOSIS — J432 Centrilobular emphysema: Secondary | ICD-10-CM | POA: Diagnosis not present

## 2024-08-01 DIAGNOSIS — R079 Chest pain, unspecified: Secondary | ICD-10-CM | POA: Diagnosis not present

## 2024-08-01 MED ORDER — AZITHROMYCIN 250 MG PO TABS
ORAL_TABLET | ORAL | 0 refills | Status: AC
  Filled 2024-08-01: qty 6, 5d supply, fill #0

## 2024-08-01 MED ORDER — PROMETHAZINE-DM 6.25-15 MG/5ML PO SYRP
5.0000 mL | ORAL_SOLUTION | Freq: Four times a day (QID) | ORAL | 0 refills | Status: AC | PRN
  Filled 2024-08-01: qty 118, 6d supply, fill #0

## 2024-08-01 MED ORDER — AZELASTINE HCL 0.1 % NA SOLN
1.0000 | Freq: Two times a day (BID) | NASAL | 12 refills | Status: AC
  Filled 2024-08-01: qty 30, 50d supply, fill #0

## 2024-08-01 NOTE — Progress Notes (Unsigned)
 --------------------------------------------                              Subjective:    Patient ID: Samantha Miranda, female    DOB: 1945/12/08, 78 y.o.   MRN: [id number]  Chief Complaint  Patient presents with   Follow-up    Pain is better in chest; but still have bad congestion in head, all stopped up on L side on face; still coughing but nothing comes up    HPI Patient is in today for f/u.  Discussed the use of AI scribe software for clinical note transcription with the patient, who gave verbal consent to proceed.  History of Present Illness Samantha Miranda is a 78 year old female who presents with persistent sinus congestion and neck pain.  She has been experiencing persistent sinus congestion primarily affecting the left side of her head for ten days, with a sensation of being unable to clear her head and one side feeling 'all stuffed up.' Despite using a neti pot, Claritin , Flonase , and Vicks Vapor Rub, she continues to experience a steady stream of mucus that 'never ends.' Her left eye is swollen, and she experiences headaches on the left side of her head. She has completed a course of prednisone .  She describes severe neck pain that lasted for six days, which was almost unbearable and nearly prompted a visit to the emergency room. The pain was a 'dull ache' that did not subside until she used a special pillow for sleeping. The pain was primarily on one side and was relieved somewhat by muscle relaxants, ibuprofen, and gabapentin . She also used hot and cold treatments for relief. The neck pain has occurred previously in July and resolved similarly.  She has a history of chest pain that has since resolved, but she continues to experience some shortness of breath and tightness in her chest, though it is not as severe as before. A previous blood test showed a slightly elevated D-dimer, which after age correction was considered acceptable. She has been using three  inhalers and continues to take Claritin  and Flonase .  She inquires about cough medicine and Sudafed, suspecting a sinus infection. She confirms having Claritin  at home and has recently refilled her stomach medicine and Zetia . No significant chest congestion on a recent x-ray. Ongoing cough and some shortness of breath.    Past Medical History:  Diagnosis Date   Barrett's esophagus    Basal cell carcinoma    multiple of face - some treated with Mohs at [facility]   Basal cell carcinoma of left lower leg    Breast cancer (HCC)    right   COPD (chronic obstructive pulmonary disease) (HCC) 2018   Depression 1989   Gastric ulcer    GERD (gastroesophageal reflux disease)    History of colon polyps    Hyperlipidemia    Osteopenia     Past Surgical History:  Procedure Laterality Date   ABDOMINAL HYSTERECTOMY  11/21/1989   APPENDECTOMY  1969   BREAST LUMPECTOMY  07/22/2006   BREAST LUMPECTOMY WITH RADIOACTIVE SEED AND SENTINEL LYMPH NODE BIOPSY Right 07/02/2021   Procedure: RIGHT BREAST LUMPECTOMY WITH RADIOACTIVE SEED AND RIGHT AXILLARY SENTINEL LYMPH NODE BIOPSY;  Surgeon: Ebbie Cough, MD;  Location: [facility] OR;  Service: General;  Laterality: Right;   CATARACT EXTRACTION, BILATERAL     EYE SURGERY  08/2013  10/2013   cataracts   PACEMAKER IMPLANT N/A  12/05/2023   Procedure: PACEMAKER IMPLANT;  Surgeon: Inocencio Soyla Lunger, MD;  Location: [facility];  Service: Cardiovascular;  Laterality: N/A;   TONSILLECTOMY      Family History  Problem Relation Age of Onset   Heart failure Mother        CHF   Hyperlipidemia Mother    Aortic stenosis Mother    Arthritis Mother    Heart disease Mother    Lung cancer Father 41   Cancer Father    Early death Father    Breast cancer Sister 64   Cancer Sister 76   Depression Sister    Stroke Maternal Aunt    Varicose Veins Maternal Aunt    Diabetes Maternal Grandmother    Hyperlipidemia Maternal Grandmother    Colon  cancer Maternal Grandfather    Cancer Maternal Grandfather        colon cancer   Cancer Paternal Grandfather        unknown type cancer   Drug abuse Daughter    Heart attack Neg Hx    Hypertension Neg Hx    Sudden death Neg Hx    Esophageal cancer Neg Hx    Stomach cancer Neg Hx    Rectal cancer Neg Hx     Social History   Socioeconomic History   Marital status: Married    Spouse name: Not on file   Number of children: 1   Years of education: Not on file   Highest education level: Some college, no degree  Occupational History   Occupation: retired    Associate Professor: [employer]    Comment: retired  Tobacco Use   Smoking status: Former    Current packs/day: 0.00    Average packs/day: 0.5 packs/day for 40.0 years (20.0 ttl pk-yrs)    Types: Cigarettes, E-cigarettes    Start date: 07/23/1971    Quit date: 07/23/2011    Years since quitting: 13.0   Smokeless tobacco: Never   Tobacco comments:    The last 5 yrs. I smoked .5 pack a day. Before that, 1 pk. a day  Vaping Use   Vaping status: Former   Substances: Nicotine  Substance and Sexual Activity   Alcohol use: Yes    Comment: occasional wine   Drug use: No   Sexual activity: Not Currently    Partners: Male    Birth control/protection: Post-menopausal  Other Topics Concern   Not on file  Social History Narrative   Exercise-virtual class, MWF   Social Drivers of Corporate investment banker Strain: Low Risk  (07/12/2024)   Overall Financial Resource Strain (CARDIA)    Difficulty of Paying Living Expenses: Not hard at all  Food Insecurity: No Food Insecurity (07/12/2024)   Hunger Vital Sign    Worried About Running Out of Food in the Last Year: Never true    Ran Out of Food in the Last Year: Never true  Transportation Needs: No Transportation Needs (07/12/2024)   PRAPARE - Administrator, Civil Service (Medical): No    Lack of Transportation (Non-Medical): No  Physical Activity: Insufficiently  Active (07/12/2024)   Exercise Vital Sign    Days of Exercise per Week: 3 days    Minutes of Exercise per Session: 20 min  Stress: Stress Concern Present (07/12/2024)   [employer] of Occupational Health - Occupational Stress Questionnaire    Feeling of Stress: To some extent  Social Connections: Moderately Isolated (07/12/2024)   Social Connection  and Isolation Panel    Frequency of Communication with Friends and Family: More than three times a week    Frequency of Social Gatherings with Friends and Family: Never    Attends Religious Services: Never    Database administrator or Organizations: No    Attends Engineer, structural: Not on file    Marital Status: Married  Catering manager Violence: Not At Risk (09/14/2023)   Humiliation, Afraid, Rape, and Kick questionnaire    Fear of Current or Ex-Partner: No    Emotionally Abused: No    Physically Abused: No    Sexually Abused: No    Outpatient Medications Prior to Visit  Medication Sig Dispense Refill   acetaminophen  (TYLENOL ) 325 MG tablet Take 2 tablets (650 mg total) by mouth every 6 (six) hours as needed for moderate pain (pain score 4-6). 30 tablet 0   albuterol  (VENTOLIN  HFA) 108 (90 Base) MCG/ACT inhaler Inhale 2 puffs into the lungs every 6 (six) hours as needed for wheezing or shortness of breath. 6.7 g 0   budesonide -glycopyrrolate -formoterol  (BREZTRI  AEROSPHERE) 160-9-4.8 MCG/ACT AERO inhaler Inhale 2 puffs into the lungs 2 (two) times daily. 10.7 g 11   Calcium -Magnesium-Zinc (CAL-MAG-ZINC PO) Take 2 tablets by mouth daily.     cholecalciferol (VITAMIN D3) 25 MCG (1000 UNIT) tablet Take 1,000 Units by mouth daily.     ezetimibe  (ZETIA ) 10 MG tablet TAKE ONE TABLET BY MOUTH ONE TIME DAILY 90 tablet 1   fluticasone  (FLONASE ) 50 MCG/ACT nasal spray Place 2 sprays into both nostrils daily. (Patient taking differently: Place 2 sprays into both nostrils daily as needed for allergies.) 16 g 5   folic acid (FOLVITE) 1  MG tablet Take 1 mg by mouth daily.     gabapentin  (NEURONTIN ) 100 MG capsule TAKE ONE CAPSULE BY MOUTH THREE TIMES A DAY (Patient taking differently: Take 100 mg by mouth at bedtime as needed (pain/RLS).) 90 capsule 0   hydroxychloroquine  (PLAQUENIL ) 200 MG tablet Take 1 tablet (200 mg total) by mouth daily. 30 tablet 5   loratadine  (CLARITIN ) 10 MG tablet Take 1 tablet (10 mg total) by mouth daily. (Patient taking differently: Take 10 mg by mouth daily as needed for allergies or rhinitis.) 30 tablet 11   losartan  (COZAAR ) 100 MG tablet TAKE ONE TABLET BY MOUTH ONE TIME DAILY 90 tablet 1   OLIVE LEAF EXTRACT PO Take 1 drop by mouth daily.     Omega-3 Fatty Acids (FISH OIL) 1200 MG CAPS Take 1,200 mg by mouth daily.     ondansetron  (ZOFRAN ) 4 MG tablet TAKE ONE TABLET BY MOUTH EVERY 8 HOURS AS NEEDED FOR NAUSEA OR VOMITING 20 tablet 0   pantoprazole  (PROTONIX ) 40 MG tablet Take 1 tablet (40 mg total) by mouth daily before breakfast. 90 tablet 1   Polyethyl Glycol-Propyl Glycol (SYSTANE ULTRA) 0.4-0.3 % SOLN Apply 1 drop to eye as needed (dry eyes).     vitamin C (ASCORBIC ACID) 250 MG tablet Take 250 mg by mouth daily.     XIIDRA  5 % SOLN Place 1 drop into both eyes in the morning and at bedtime.  1   zolpidem  (AMBIEN ) 5 MG tablet TAKE ONE TABLET BY MOUTH AT BEDTIME AS NEEDED FOR SLEEP 30 tablet 1   azithromycin  (ZITHROMAX  Z-PAK) 250 MG tablet Take as directed on package 6 each 0   No facility-administered medications prior to visit.    Allergies  Allergen Reactions   Statins Other (See Comments)  Myalgia    Augmentin  [Amoxicillin -Pot Clavulanate] Nausea And Vomiting   Moxifloxacin Nausea And Vomiting    Review of Systems  Constitutional:  Negative for fever and malaise/fatigue.  HENT:  Positive for congestion.   Eyes:  Negative for blurred vision.  Respiratory:  Negative for cough and shortness of breath.   Cardiovascular:  Negative for chest pain, palpitations and leg swelling.   Gastrointestinal:  Negative for vomiting.  Musculoskeletal:  Negative for back pain.  Skin:  Negative for rash.  Neurological:  Negative for loss of consciousness and headaches.       Objective:    Physical Exam Vitals and nursing note reviewed.  Constitutional:      General: She is not in acute distress.    Appearance: Normal appearance. She is well-developed.  HENT:     Head: Normocephalic and atraumatic.     Nose:     Right Sinus: Maxillary sinus tenderness and frontal sinus tenderness present.     Left Sinus: Frontal sinus tenderness present.  Eyes:     General: No scleral icterus.       Right eye: No discharge.        Left eye: No discharge.  Cardiovascular:     Rate and Rhythm: Normal rate and regular rhythm.     Heart sounds: No murmur heard. Pulmonary:     Effort: Pulmonary effort is normal. No respiratory distress.     Breath sounds: Normal breath sounds.  Musculoskeletal:        General: Normal range of motion.     Cervical back: Normal range of motion and neck supple.     Right lower leg: No edema.     Left lower leg: No edema.  Skin:    General: Skin is warm and dry.  Neurological:     Mental Status: She is alert and oriented to person, place, and time.  Psychiatric:        Mood and Affect: Mood normal.        Behavior: Behavior normal.        Thought Content: Thought content normal.        Judgment: Judgment normal.     BP (!) 144/78   Pulse 87   Temp 97.7 F (36.5 C)   Ht 5' 4 (1.626 m)   Wt 120 lb (54.4 kg)   SpO2 98%   BMI 20.60 kg/m  Wt Readings from Last 3 Encounters:  08/01/24 120 lb (54.4 kg)  07/12/24 119 lb 6.4 oz (54.2 kg)  04/01/24 119 lb 12.8 oz (54.3 kg)    Diabetic Foot Exam - Simple   No data filed    Lab Results  Component Value Date   WBC 6.0 07/12/2024   HGB 13.0 07/12/2024   HCT 38.6 07/12/2024   PLT 204.0 07/12/2024   GLUCOSE 87 07/12/2024   CHOL 186 07/12/2024   TRIG 75.0 07/12/2024   HDL 65.60 07/12/2024    LDLDIRECT 176.2 08/12/2013   LDLCALC 105 (H) 07/12/2024   ALT 13 07/12/2024   AST 22 07/12/2024   NA 139 07/12/2024   K 3.9 07/12/2024   CL 103 07/12/2024   CREATININE 0.83 07/12/2024   BUN 17 07/12/2024   CO2 27 07/12/2024   TSH 1.29 07/12/2024   HGBA1C 5.1 08/04/2017    Lab Results  Component Value Date   TSH 1.29 07/12/2024   Lab Results  Component Value Date   WBC 6.0 07/12/2024   HGB 13.0 07/12/2024   HCT  38.6 07/12/2024   MCV 91.4 07/12/2024   PLT 204.0 07/12/2024   Lab Results  Component Value Date   NA 139 07/12/2024   K 3.9 07/12/2024   CO2 27 07/12/2024   GLUCOSE 87 07/12/2024   BUN 17 07/12/2024   CREATININE 0.83 07/12/2024   BILITOT 0.5 07/12/2024   ALKPHOS 51 07/12/2024   AST 22 07/12/2024   ALT 13 07/12/2024   PROT 6.6 07/12/2024   ALBUMIN 4.0 07/12/2024   CALCIUM  9.0 07/12/2024   ANIONGAP 6 03/28/2024   GFR 68.04 07/12/2024   Lab Results  Component Value Date   CHOL 186 07/12/2024   Lab Results  Component Value Date   HDL 65.60 07/12/2024   Lab Results  Component Value Date   LDLCALC 105 (H) 07/12/2024   Lab Results  Component Value Date   TRIG 75.0 07/12/2024   Lab Results  Component Value Date   CHOLHDL 3 07/12/2024   Lab Results  Component Value Date   HGBA1C 5.1 08/04/2017       Assessment & Plan:  Acute non-recurrent pansinusitis -     Azithromycin ; Take 2 tablets (500 mg) by mouth on day 1, THEN 1 tablet (250 mg) daily on days 2 through 5.  Dispense: 6 tablet; Refill: 0 -     Promethazine -DM; Take 5 mLs by mouth 4 (four) times daily as needed.  Dispense: 118 mL; Refill: 0 -     Azelastine  HCl; Place 1 spray into both nostrils 2 (two) times daily. Use in each nostril as directed.  Dispense: 30 mL; Refill: 12  Acute cough -     Promethazine -DM; Take 5 mLs by mouth 4 (four) times daily as needed.  Dispense: 118 mL; Refill: 0 -     CT CHEST WO CONTRAST; Future    Genella Bas R Lowne Chase, DO

## 2024-08-27 NOTE — Progress Notes (Signed)
 Remote PPM Transmission

## 2024-09-03 DIAGNOSIS — I509 Heart failure, unspecified: Secondary | ICD-10-CM | POA: Diagnosis not present

## 2024-09-03 DIAGNOSIS — I739 Peripheral vascular disease, unspecified: Secondary | ICD-10-CM | POA: Diagnosis not present

## 2024-09-03 DIAGNOSIS — N1831 Chronic kidney disease, stage 3a: Secondary | ICD-10-CM | POA: Diagnosis not present

## 2024-09-03 DIAGNOSIS — I130 Hypertensive heart and chronic kidney disease with heart failure and stage 1 through stage 4 chronic kidney disease, or unspecified chronic kidney disease: Secondary | ICD-10-CM | POA: Diagnosis not present

## 2024-09-03 DIAGNOSIS — J449 Chronic obstructive pulmonary disease, unspecified: Secondary | ICD-10-CM | POA: Diagnosis not present

## 2024-09-03 DIAGNOSIS — M1990 Unspecified osteoarthritis, unspecified site: Secondary | ICD-10-CM | POA: Diagnosis not present

## 2024-09-03 DIAGNOSIS — M069 Rheumatoid arthritis, unspecified: Secondary | ICD-10-CM | POA: Diagnosis not present

## 2024-09-03 DIAGNOSIS — I700 Atherosclerosis of aorta: Secondary | ICD-10-CM | POA: Diagnosis not present

## 2024-09-03 DIAGNOSIS — F324 Major depressive disorder, single episode, in partial remission: Secondary | ICD-10-CM | POA: Diagnosis not present

## 2024-09-04 ENCOUNTER — Ambulatory Visit: Payer: Medicare PPO

## 2024-09-04 DIAGNOSIS — I442 Atrioventricular block, complete: Secondary | ICD-10-CM

## 2024-09-05 LAB — CUP PACEART REMOTE DEVICE CHECK
Battery Remaining Longevity: 149 mo
Battery Voltage: 3.15 V
Brady Statistic AP VP Percent: 0.45 %
Brady Statistic AP VS Percent: 0 %
Brady Statistic AS VP Percent: 99.54 %
Brady Statistic AS VS Percent: 0.01 %
Brady Statistic RA Percent Paced: 0.44 %
Brady Statistic RV Percent Paced: 99.99 %
Date Time Interrogation Session: 20251014205231
Implantable Lead Connection Status: 753985
Implantable Lead Connection Status: 753985
Implantable Lead Implant Date: 20250114
Implantable Lead Implant Date: 20250114
Implantable Lead Location: 753859
Implantable Lead Location: 753860
Implantable Lead Model: 3830
Implantable Lead Model: 5076
Implantable Pulse Generator Implant Date: 20250114
Lead Channel Impedance Value: 361 Ohm
Lead Channel Impedance Value: 418 Ohm
Lead Channel Impedance Value: 570 Ohm
Lead Channel Impedance Value: 608 Ohm
Lead Channel Pacing Threshold Amplitude: 0.5 V
Lead Channel Pacing Threshold Amplitude: 0.625 V
Lead Channel Pacing Threshold Pulse Width: 0.4 ms
Lead Channel Pacing Threshold Pulse Width: 0.4 ms
Lead Channel Sensing Intrinsic Amplitude: 2.25 mV
Lead Channel Sensing Intrinsic Amplitude: 2.25 mV
Lead Channel Sensing Intrinsic Amplitude: 29.75 mV
Lead Channel Sensing Intrinsic Amplitude: 31.625 mV
Lead Channel Setting Pacing Amplitude: 1.5 V
Lead Channel Setting Pacing Amplitude: 2 V
Lead Channel Setting Pacing Pulse Width: 0.4 ms
Lead Channel Setting Sensing Sensitivity: 2.8 mV
Zone Setting Status: 755011
Zone Setting Status: 755011

## 2024-09-09 ENCOUNTER — Other Ambulatory Visit: Payer: Self-pay | Admitting: Nurse Practitioner

## 2024-09-10 ENCOUNTER — Encounter: Payer: Self-pay | Admitting: Family Medicine

## 2024-09-10 ENCOUNTER — Ambulatory Visit: Admitting: Family Medicine

## 2024-09-10 VITALS — BP 142/80 | HR 85 | Temp 98.2°F | Resp 16 | Ht 64.0 in | Wt 121.4 lb

## 2024-09-10 DIAGNOSIS — R1011 Right upper quadrant pain: Secondary | ICD-10-CM

## 2024-09-10 DIAGNOSIS — Z23 Encounter for immunization: Secondary | ICD-10-CM | POA: Diagnosis not present

## 2024-09-10 MED ORDER — ALUM & MAG HYDROXIDE-SIMETH 200-200-20 MG/5ML PO SUSP
30.0000 mL | Freq: Once | ORAL | Status: AC
  Administered 2024-09-10: 30 mL via ORAL

## 2024-09-10 MED ORDER — FLUTICASONE-SALMETEROL 100-50 MCG/ACT IN AEPB: 1.0000 | INHALATION_SPRAY | Freq: Two times a day (BID) | RESPIRATORY_TRACT | Status: DC

## 2024-09-10 MED ORDER — LIDOCAINE VISCOUS HCL 2 % MT SOLN
15.0000 mL | Freq: Once | OROMUCOSAL | Status: AC
  Administered 2024-09-10: 15 mL via OROMUCOSAL

## 2024-09-10 MED ORDER — HYOSCYAMINE SULFATE 0.125 MG SL SUBL
0.1250 mg | SUBLINGUAL_TABLET | Freq: Once | SUBLINGUAL | Status: AC
  Administered 2024-09-10: 0.125 mg via SUBLINGUAL

## 2024-09-10 NOTE — Progress Notes (Signed)
 Remote PPM Transmission

## 2024-09-10 NOTE — Progress Notes (Signed)
 ---  Subjective:    Patient ID: Samantha Miranda, female    DOB: [birth year], 78 y.o.   MRN: [id number]  Chief Complaint  Patient presents with   Abdominal Pain    Upper right abdominal pain, having nausea, doesn't feel good.    HPI Patient is in today for abd pain.  Discussed the use of AI scribe software for clinical note transcription with the patient, who gave verbal consent to proceed.  History of Present Illness Samantha Miranda is a 78 year old female with gastrointestinal issues who presents with abdominal pain and nausea.  She has been experiencing persistent abdominal pain. The pain occurs both when she eats and when she does not eat, and it is severe enough to disrupt her sleep. The pain is located in the upper abdomen, more intense in the middle than on the right side, and worsens at night when lying down. She has been taking pantoprazole , doubling the dose to one in the morning and one at night, which has provided some relief. Eating cereal and milk before the visit helped alleviate the pain temporarily.  She has experienced nausea and has vomited once. No worsening of pain with fatty foods like hamburgers or pizza. The pain is notably worse at night and when lying down.  She has a history of COPD and reports an adverse reaction to Breztri , which causes her throat to become sore and her voice to become hoarse. As a result, she has reverted to using Advair, specifically the 100/50 dose, which she recently refilled.    Past Medical History:  Diagnosis Date   Barrett's esophagus    Basal cell carcinoma    multiple of face - some treated with Mohs at [facility]   Basal cell carcinoma of left lower leg    Breast cancer (HCC)    right   COPD (chronic obstructive pulmonary disease) (HCC) 2018   Depression 1989   Gastric ulcer    GERD (gastroesophageal reflux disease)    History of colon polyps    Hyperlipidemia    Osteopenia     Past Surgical History:   Procedure Laterality Date   ABDOMINAL HYSTERECTOMY  11/21/1989   APPENDECTOMY  1969   BREAST LUMPECTOMY  07/22/2006   BREAST LUMPECTOMY WITH RADIOACTIVE SEED AND SENTINEL LYMPH NODE BIOPSY Right 07/02/2021   Procedure: RIGHT BREAST LUMPECTOMY WITH RADIOACTIVE SEED AND RIGHT AXILLARY SENTINEL LYMPH NODE BIOPSY;  Surgeon: Ebbie Cough, MD;  Location: [facility] OR;  Service: General;  Laterality: Right;   CATARACT EXTRACTION, BILATERAL     EYE SURGERY  08/2013  10/2013   cataracts   PACEMAKER IMPLANT N/A 12/05/2023   Procedure: PACEMAKER IMPLANT;  Surgeon: Inocencio Soyla Lunger, MD;  Location: [facility];  Service: Cardiovascular;  Laterality: N/A;   TONSILLECTOMY      Family History  Problem Relation Age of Onset   Heart failure Mother        CHF   Hyperlipidemia Mother    Aortic stenosis Mother    Arthritis Mother    Heart disease Mother    Lung cancer Father 73   Cancer Father    Early death Father    Breast cancer Sister 68   Cancer Sister 72   Depression Sister    Stroke Maternal Aunt    Varicose Veins Maternal Aunt    Diabetes Maternal Grandmother    Hyperlipidemia Maternal Grandmother    Colon cancer Maternal Grandfather    Cancer Maternal  Grandfather        colon cancer   Cancer Paternal Grandfather        unknown type cancer   Drug abuse Daughter    Heart attack Neg Hx    Hypertension Neg Hx    Sudden death Neg Hx    Esophageal cancer Neg Hx    Stomach cancer Neg Hx    Rectal cancer Neg Hx     Social History   Socioeconomic History   Marital status: Married    Spouse name: Not on file   Number of children: 1   Years of education: Not on file   Highest education level: Some college, no degree  Occupational History   Occupation: retired    Associate Professor: [employer]    Comment: retired  Tobacco Use   Smoking status: Former    Current packs/day: 0.00    Average packs/day: 0.5 packs/day for 40.0 years (20.0 ttl pk-yrs)    Types: Cigarettes,  E-cigarettes    Start date: 07/23/1971    Quit date: 07/23/2011    Years since quitting: 13.1   Smokeless tobacco: Never   Tobacco comments:    The last 5 yrs. I smoked .5 pack a day. Before that, 1 pk. a day  Vaping Use   Vaping status: Former   Substances: Nicotine  Substance and Sexual Activity   Alcohol use: Yes    Comment: occasional wine   Drug use: No   Sexual activity: Not Currently    Partners: Male    Birth control/protection: Post-menopausal  Other Topics Concern   Not on file  Social History Narrative   Exercise-virtual class, MWF   Social Drivers of Corporate investment banker Strain: Low Risk  (09/09/2024)   Overall Financial Resource Strain (CARDIA)    Difficulty of Paying Living Expenses: Not hard at all  Food Insecurity: No Food Insecurity (09/09/2024)   Hunger Vital Sign    Worried About Running Out of Food in the Last Year: Never true    Ran Out of Food in the Last Year: Never true  Transportation Needs: No Transportation Needs (09/09/2024)   PRAPARE - Administrator, Civil Service (Medical): No    Lack of Transportation (Non-Medical): No  Physical Activity: Inactive (09/09/2024)   Exercise Vital Sign    Days of Exercise per Week: 0 days    Minutes of Exercise per Session: Not on file  Stress: Stress Concern Present (09/09/2024)   [employer] of Occupational Health - Occupational Stress Questionnaire    Feeling of Stress: To some extent  Social Connections: Moderately Integrated (09/09/2024)   Social Connection and Isolation Panel    Frequency of Communication with Friends and Family: Three times a week    Frequency of Social Gatherings with Friends and Family: Never    Attends Religious Services: Never    Database administrator or Organizations: Yes    Attends Engineer, structural: 1 to 4 times per year    Marital Status: Married  Recent Concern: Social Connections - Moderately Isolated (07/12/2024)   Social Connection and  Isolation Panel    Frequency of Communication with Friends and Family: More than three times a week    Frequency of Social Gatherings with Friends and Family: Never    Attends Religious Services: Never    Database administrator or Organizations: No    Attends Engineer, structural: Not on file    Marital Status: Married  Intimate Partner Violence: Not At Risk (09/14/2023)   Humiliation, Afraid, Rape, and Kick questionnaire    Fear of Current or Ex-Partner: No    Emotionally Abused: No    Physically Abused: No    Sexually Abused: No    Outpatient Medications Prior to Visit  Medication Sig Dispense Refill   acetaminophen  (TYLENOL ) 325 MG tablet Take 2 tablets (650 mg total) by mouth every 6 (six) hours as needed for moderate pain (pain score 4-6). 30 tablet 0   albuterol  (VENTOLIN  HFA) 108 (90 Base) MCG/ACT inhaler Inhale 2 puffs into the lungs every 6 (six) hours as needed for wheezing or shortness of breath. 6.7 g 0   azelastine  (ASTELIN ) 0.1 % nasal spray Place 1 spray into both nostrils 2 (two) times daily. Use in each nostril as directed. 30 mL 12   Calcium -Magnesium-Zinc (CAL-MAG-ZINC PO) Take 2 tablets by mouth daily.     cholecalciferol (VITAMIN D3) 25 MCG (1000 UNIT) tablet Take 1,000 Units by mouth daily.     ezetimibe  (ZETIA ) 10 MG tablet TAKE ONE TABLET BY MOUTH ONE TIME DAILY 90 tablet 1   fluticasone  (FLONASE ) 50 MCG/ACT nasal spray Place 2 sprays into both nostrils daily. (Patient taking differently: Place 2 sprays into both nostrils daily as needed for allergies.) 16 g 5   folic acid (FOLVITE) 1 MG tablet Take 1 mg by mouth daily.     gabapentin  (NEURONTIN ) 100 MG capsule TAKE ONE CAPSULE BY MOUTH THREE TIMES A DAY 90 capsule 0   hydroxychloroquine  (PLAQUENIL ) 200 MG tablet Take 1 tablet (200 mg total) by mouth daily. 30 tablet 5   loratadine  (CLARITIN ) 10 MG tablet Take 1 tablet (10 mg total) by mouth daily. (Patient taking differently: Take 10 mg by mouth daily as  needed for allergies or rhinitis.) 30 tablet 11   losartan  (COZAAR ) 100 MG tablet TAKE ONE TABLET BY MOUTH ONE TIME DAILY 90 tablet 1   OLIVE LEAF EXTRACT PO Take 1 drop by mouth daily.     Omega-3 Fatty Acids (FISH OIL) 1200 MG CAPS Take 1,200 mg by mouth daily.     ondansetron  (ZOFRAN ) 4 MG tablet TAKE ONE TABLET BY MOUTH EVERY 8 HOURS AS NEEDED FOR NAUSEA OR VOMITING 20 tablet 0   pantoprazole  (PROTONIX ) 40 MG tablet Take 1 tablet (40 mg total) by mouth daily before breakfast. 90 tablet 1   Polyethyl Glycol-Propyl Glycol (SYSTANE ULTRA) 0.4-0.3 % SOLN Apply 1 drop to eye as needed (dry eyes).     promethazine -dextromethorphan (PROMETHAZINE -DM) 6.25-15 MG/5ML syrup Take 5 mLs by mouth 4 (four) times daily as needed. 118 mL 0   vitamin C (ASCORBIC ACID) 250 MG tablet Take 250 mg by mouth daily.     XIIDRA  5 % SOLN Place 1 drop into both eyes in the morning and at bedtime.  1   zolpidem  (AMBIEN ) 5 MG tablet TAKE ONE TABLET BY MOUTH AT BEDTIME AS NEEDED FOR SLEEP 30 tablet 1   budesonide -glycopyrrolate -formoterol  (BREZTRI  AEROSPHERE) 160-9-4.8 MCG/ACT AERO inhaler Inhale 2 puffs into the lungs 2 (two) times daily. 10.7 g 11   No facility-administered medications prior to visit.    Allergies  Allergen Reactions   Statins Other (See Comments)    Myalgia    Augmentin  [Amoxicillin -Pot Clavulanate] Nausea And Vomiting   Moxifloxacin Nausea And Vomiting    Review of Systems  Constitutional:  Negative for chills, fever and malaise/fatigue.  HENT:  Negative for congestion and hearing loss.   Eyes:  Negative for  blurred vision and discharge.  Respiratory:  Negative for cough, sputum production and shortness of breath.   Cardiovascular:  Negative for chest pain, palpitations and leg swelling.  Gastrointestinal:  Positive for abdominal pain. Negative for blood in stool, constipation, diarrhea, heartburn, nausea and vomiting.  Genitourinary:  Negative for dysuria, frequency, hematuria and urgency.   Musculoskeletal:  Negative for back pain, falls and myalgias.  Skin:  Negative for rash.  Neurological:  Negative for dizziness, sensory change, loss of consciousness, weakness and headaches.  Endo/Heme/Allergies:  Negative for environmental allergies. Does not bruise/bleed easily.  Psychiatric/Behavioral:  Negative for depression and suicidal ideas. The patient is not nervous/anxious and does not have insomnia.        Objective:    Physical Exam Vitals and nursing note reviewed.  Constitutional:      General: She is not in acute distress.    Appearance: Normal appearance. She is well-developed.  HENT:     Head: Normocephalic and atraumatic.     Right Ear: Tympanic membrane, ear canal and external ear normal. There is no impacted cerumen.     Left Ear: Tympanic membrane, ear canal and external ear normal. There is no impacted cerumen.     Nose: Nose normal.     Mouth/Throat:     Mouth: Mucous membranes are moist.     Pharynx: Oropharynx is clear. No oropharyngeal exudate or posterior oropharyngeal erythema.  Eyes:     General: No scleral icterus.       Right eye: No discharge.        Left eye: No discharge.     Conjunctiva/sclera: Conjunctivae normal.     Pupils: Pupils are equal, round, and reactive to light.  Neck:     Thyroid : No thyromegaly or thyroid  tenderness.     Vascular: No JVD.  Cardiovascular:     Rate and Rhythm: Normal rate and regular rhythm.     Heart sounds: Normal heart sounds. No murmur heard. Pulmonary:     Effort: Pulmonary effort is normal. No respiratory distress.     Breath sounds: Normal breath sounds.  Abdominal:     General: Bowel sounds are normal. There is no distension.     Palpations: Abdomen is soft. There is no mass.     Tenderness: There is abdominal tenderness in the right upper quadrant. There is guarding. There is no rebound.  Genitourinary:    Vagina: Normal.  Musculoskeletal:        General: Normal range of motion.     Cervical  back: Normal range of motion and neck supple.     Right lower leg: No edema.     Left lower leg: No edema.  Lymphadenopathy:     Cervical: No cervical adenopathy.  Skin:    General: Skin is warm and dry.     Findings: No erythema or rash.  Neurological:     Mental Status: She is alert and oriented to person, place, and time.     Cranial Nerves: No cranial nerve deficit.     Deep Tendon Reflexes: Reflexes are normal and symmetric.  Psychiatric:        Mood and Affect: Mood normal.        Behavior: Behavior normal.        Thought Content: Thought content normal.        Judgment: Judgment normal.     BP (!) 142/80 (BP Location: Left Arm, Patient Position: Sitting)   Pulse 85   Temp 98.2 F (  36.8 C) (Oral)   Resp 16   Ht 5' 4 (1.626 m)   Wt 121 lb 6.4 oz (55.1 kg)   SpO2 98%   BMI 20.84 kg/m  Wt Readings from Last 3 Encounters:  09/10/24 121 lb 6.4 oz (55.1 kg)  08/01/24 120 lb (54.4 kg)  07/12/24 119 lb 6.4 oz (54.2 kg)    Diabetic Foot Exam - Simple   No data filed    Lab Results  Component Value Date   WBC 6.0 07/12/2024   HGB 13.0 07/12/2024   HCT 38.6 07/12/2024   PLT 204.0 07/12/2024   GLUCOSE 87 07/12/2024   CHOL 186 07/12/2024   TRIG 75.0 07/12/2024   HDL 65.60 07/12/2024   LDLDIRECT 176.2 08/12/2013   LDLCALC 105 (H) 07/12/2024   ALT 13 07/12/2024   AST 22 07/12/2024   NA 139 07/12/2024   K 3.9 07/12/2024   CL 103 07/12/2024   CREATININE 0.83 07/12/2024   BUN 17 07/12/2024   CO2 27 07/12/2024   TSH 1.29 07/12/2024   HGBA1C 5.1 08/04/2017    Lab Results  Component Value Date   TSH 1.29 07/12/2024   Lab Results  Component Value Date   WBC 6.0 07/12/2024   HGB 13.0 07/12/2024   HCT 38.6 07/12/2024   MCV 91.4 07/12/2024   PLT 204.0 07/12/2024   Lab Results  Component Value Date   NA 139 07/12/2024   K 3.9 07/12/2024   CO2 27 07/12/2024   GLUCOSE 87 07/12/2024   BUN 17 07/12/2024   CREATININE 0.83 07/12/2024   BILITOT 0.5 07/12/2024    ALKPHOS 51 07/12/2024   AST 22 07/12/2024   ALT 13 07/12/2024   PROT 6.6 07/12/2024   ALBUMIN 4.0 07/12/2024   CALCIUM  9.0 07/12/2024   ANIONGAP 6 03/28/2024   GFR 68.04 07/12/2024   Lab Results  Component Value Date   CHOL 186 07/12/2024   Lab Results  Component Value Date   HDL 65.60 07/12/2024   Lab Results  Component Value Date   LDLCALC 105 (H) 07/12/2024   Lab Results  Component Value Date   TRIG 75.0 07/12/2024   Lab Results  Component Value Date   CHOLHDL 3 07/12/2024   Lab Results  Component Value Date   HGBA1C 5.1 08/04/2017       Assessment & Plan:  RUQ pain -     US  ABDOMEN LIMITED RUQ (LIVER/GB); Future -     CBC with Differential/Platelet -     Comprehensive metabolic panel with GFR -     Lipase -     Alum & Mag Hydroxide-Simeth -     Lidocaine  Viscous HCl -     Hyoscyamine Sulfate  Need for influenza vaccination -     Flu vaccine HIGH DOSE PF(Fluzone Trivalent)  Other orders -     Fluticasone -Salmeterol; Inhale 1 puff into the lungs 2 (two) times daily.  Assessment and Plan Assessment & Plan Epigastric pain, possible peptic ulcer disease   Epigastric pain, potentially due to peptic ulcer disease, worsens at night and when supine, but is relieved by cereal and milk. Differential diagnosis includes gallbladder issues. Pantoprazole  provides some relief. Administer a GI cocktail to assess for ulcer or reflux. Order an abdominal ultrasound to rule out gallbladder issues and blood work. Continue pantoprazole , 1 tablet in the morning and 1 at night. Refer to a GI specialist if necessary.  Chronic obstructive pulmonary disease (COPD)   COPD management with Breztri  was not tolerated  due to sore throat and hoarseness. She reverted to Advair Diskus, which is well-tolerated. Continue Advair Diskus 100/50 as previously prescribed.  General Health Maintenance   She has not received a flu shot this season and agrees to receive it today. Administer flu  shot. '  Tayvian Holycross R Antonio Meth, DO

## 2024-09-11 LAB — CBC WITH DIFFERENTIAL/PLATELET
Basophils Absolute: 0.1 K/uL (ref 0.0–0.1)
Basophils Relative: 1.2 % (ref 0.0–3.0)
Eosinophils Absolute: 0.2 K/uL (ref 0.0–0.7)
Eosinophils Relative: 2.9 % (ref 0.0–5.0)
HCT: 38.1 % (ref 36.0–46.0)
Hemoglobin: 12.7 g/dL (ref 12.0–15.0)
Lymphocytes Relative: 27.3 % (ref 12.0–46.0)
Lymphs Abs: 1.5 K/uL (ref 0.7–4.0)
MCHC: 33.2 g/dL (ref 30.0–36.0)
MCV: 92.2 fl (ref 78.0–100.0)
Monocytes Absolute: 0.5 K/uL (ref 0.1–1.0)
Monocytes Relative: 8.3 % (ref 3.0–12.0)
Neutro Abs: 3.3 K/uL (ref 1.4–7.7)
Neutrophils Relative %: 60.3 % (ref 43.0–77.0)
Platelets: 232 K/uL (ref 150.0–400.0)
RBC: 4.13 Mil/uL (ref 3.87–5.11)
RDW: 13.6 % (ref 11.5–15.5)
WBC: 5.4 K/uL (ref 4.0–10.5)

## 2024-09-11 LAB — COMPREHENSIVE METABOLIC PANEL WITH GFR
ALT: 14 U/L (ref 0–35)
AST: 21 U/L (ref 0–37)
Albumin: 4 g/dL (ref 3.5–5.2)
Alkaline Phosphatase: 45 U/L (ref 39–117)
BUN: 18 mg/dL (ref 6–23)
CO2: 29 meq/L (ref 19–32)
Calcium: 8.9 mg/dL (ref 8.4–10.5)
Chloride: 98 meq/L (ref 96–112)
Creatinine, Ser: 1.01 mg/dL (ref 0.40–1.20)
GFR: 53.69 mL/min — ABNORMAL LOW (ref >60.00)
Glucose, Bld: 170 mg/dL — ABNORMAL HIGH (ref 70–99)
Potassium: 3.8 meq/L (ref 3.5–5.1)
Sodium: 136 meq/L (ref 135–145)
Total Bilirubin: 0.4 mg/dL (ref 0.2–1.2)
Total Protein: 6.4 g/dL (ref 6.0–8.3)

## 2024-09-11 LAB — LIPASE: Lipase: 30 U/L (ref 11.0–59.0)

## 2024-09-12 ENCOUNTER — Ambulatory Visit: Admitting: Dermatology

## 2024-09-12 ENCOUNTER — Ambulatory Visit (HOSPITAL_BASED_OUTPATIENT_CLINIC_OR_DEPARTMENT_OTHER)
Admission: RE | Admit: 2024-09-12 | Discharge: 2024-09-12 | Disposition: A | Discharge status: Home / Self Care | Source: Ambulatory Visit | Attending: Family Medicine | Admitting: Family Medicine

## 2024-09-12 DIAGNOSIS — R1011 Right upper quadrant pain: Secondary | ICD-10-CM | POA: Insufficient documentation

## 2024-09-15 ENCOUNTER — Ambulatory Visit: Payer: Self-pay | Admitting: Family Medicine

## 2024-09-16 ENCOUNTER — Ambulatory Visit: Payer: Self-pay | Admitting: Cardiology

## 2024-09-17 ENCOUNTER — Ambulatory Visit: Payer: Medicare PPO | Admitting: *Deleted

## 2024-09-17 ENCOUNTER — Telehealth: Payer: Self-pay | Admitting: *Deleted

## 2024-09-17 VITALS — Ht 64.0 in | Wt 121.0 lb

## 2024-09-17 DIAGNOSIS — Z0000 Encounter for general adult medical examination without abnormal findings: Secondary | ICD-10-CM | POA: Diagnosis not present

## 2024-09-17 NOTE — Progress Notes (Cosign Needed)
 Subjective:   Samantha Miranda is a 78 y.o. who presents for a Medicare Wellness preventive visit.  As a reminder, Annual Wellness Visits don't include a physical exam, and some assessments may be limited, especially if this visit is performed virtually. We may recommend an in-person follow-up visit with your provider if needed.  Visit Complete: Virtual I connected with  Samantha Miranda on 09/17/24 by a audio enabled telemedicine application and verified that I am speaking with the correct person using two identifiers.  Patient Location: Home  Provider Location: Office/Clinic  I discussed the limitations of evaluation and management by telemedicine. The patient expressed understanding and agreed to proceed.  Vital Signs: Because this visit was a virtual/telehealth visit, some criteria may be missing or patient reported. Any vitals not documented were not able to be obtained and vitals that have been documented are patient reported.  VideoDeclined- This patient declined Librarian, academic. Therefore the visit was completed with audio only.  Persons Participating in Visit: Patient.  AWV Questionnaire: Yes: Patient Medicare AWV questionnaire was completed by the patient on 09/10/24; I have confirmed that all information answered by patient is correct and no changes since this date.  Cardiac Risk Factors include: advanced age (>68men, >5 women);dyslipidemia;hypertension;Other (see comment), Risk factor comments: hx breast cancer, COPD, Emphysema, atherosclerosis     Objective:    Today's Vitals   09/17/24 1513  Weight: 121 lb (54.9 kg)  Height: 5' 4 (1.626 m)   Body mass index is 20.77 kg/m.     09/17/2024    3:25 PM 12/05/2023   11:04 AM 11/06/2023    1:10 PM 09/14/2023    3:46 PM 08/02/2022    1:09 PM 08/12/2021    5:44 PM 07/02/2021    7:19 AM  Advanced Directives  Does Patient Have a Medical Advance Directive? No Yes Yes Yes Yes No Yes  Type of  Advance Directive  Living will;Healthcare Power of [employer] Power of [place];Living will Healthcare Power of [place];Living will Healthcare Power of [place];Living will  Healthcare Power of [place];Living will  Copy of Healthcare Power of Attorney in Chart?    No - copy requested No - copy requested  No - copy requested  Would patient like information on creating a medical advance directive? No - Patient declined     No - Patient declined No - Patient declined    Current Medications (verified) Outpatient Encounter Medications as of 09/17/2024  Medication Sig   acetaminophen  (TYLENOL ) 325 MG tablet Take 2 tablets (650 mg total) by mouth every 6 (six) hours as needed for moderate pain (pain score 4-6).   albuterol  (VENTOLIN  HFA) 108 (90 Base) MCG/ACT inhaler Inhale 2 puffs into the lungs every 6 (six) hours as needed for wheezing or shortness of breath.   azelastine  (ASTELIN ) 0.1 % nasal spray Place 1 spray into both nostrils 2 (two) times daily. Use in each nostril as directed.   Calcium -Magnesium-Zinc (CAL-MAG-ZINC PO) Take 2 tablets by mouth daily.   cholecalciferol (VITAMIN D3) 25 MCG (1000 UNIT) tablet Take 1,000 Units by mouth daily.   ezetimibe  (ZETIA ) 10 MG tablet TAKE ONE TABLET BY MOUTH ONE TIME DAILY   fluticasone  (FLONASE ) 50 MCG/ACT nasal spray Place 2 sprays into both nostrils daily. (Patient taking differently: Place 2 sprays into both nostrils daily as needed for allergies.)   fluticasone -salmeterol (ADVAIR DISKUS) 100-50 MCG/ACT AEPB Inhale 1 puff into the lungs 2 (two) times daily.   folic acid (FOLVITE) 1  MG tablet Take 1 mg by mouth daily.   gabapentin  (NEURONTIN ) 100 MG capsule TAKE ONE CAPSULE BY MOUTH THREE TIMES A DAY   hydroxychloroquine  (PLAQUENIL ) 200 MG tablet Take 1 tablet (200 mg total) by mouth daily.   loratadine  (CLARITIN ) 10 MG tablet Take 1 tablet (10 mg total) by mouth daily. (Patient taking differently: Take 10 mg by mouth daily as needed for  allergies or rhinitis.)   losartan  (COZAAR ) 100 MG tablet TAKE ONE TABLET BY MOUTH ONE TIME DAILY   OLIVE LEAF EXTRACT PO Take 1 drop by mouth daily.   Omega-3 Fatty Acids (FISH OIL) 1200 MG CAPS Take 1,200 mg by mouth daily.   ondansetron  (ZOFRAN ) 4 MG tablet TAKE ONE TABLET BY MOUTH EVERY 8 HOURS AS NEEDED FOR NAUSEA OR VOMITING   pantoprazole  (PROTONIX ) 40 MG tablet Take 1 tablet (40 mg total) by mouth daily before breakfast.   Polyethyl Glycol-Propyl Glycol (SYSTANE ULTRA) 0.4-0.3 % SOLN Apply 1 drop to eye as needed (dry eyes).   promethazine -dextromethorphan (PROMETHAZINE -DM) 6.25-15 MG/5ML syrup Take 5 mLs by mouth 4 (four) times daily as needed.   vitamin C (ASCORBIC ACID) 250 MG tablet Take 250 mg by mouth daily.   XIIDRA  5 % SOLN Place 1 drop into both eyes in the morning and at bedtime.   zolpidem  (AMBIEN ) 5 MG tablet TAKE ONE TABLET BY MOUTH AT BEDTIME AS NEEDED FOR SLEEP   No facility-administered encounter medications on file as of 09/17/2024.    Allergies (verified) Statins, Augmentin  [amoxicillin -pot clavulanate], and Moxifloxacin   History: Past Medical History:  Diagnosis Date   Barrett's esophagus    Basal cell carcinoma    multiple of face - some treated with Mohs at [facility]   Basal cell carcinoma of left lower leg    Breast cancer (HCC)    right   COPD (chronic obstructive pulmonary disease) (HCC) 2018   Depression 1989   Gastric ulcer    GERD (gastroesophageal reflux disease)    History of colon polyps    Hyperlipidemia    Osteopenia    Past Surgical History:  Procedure Laterality Date   ABDOMINAL HYSTERECTOMY  11/21/1989   APPENDECTOMY  1969   BREAST LUMPECTOMY  07/22/2006   BREAST LUMPECTOMY WITH RADIOACTIVE SEED AND SENTINEL LYMPH NODE BIOPSY Right 07/02/2021   Procedure: RIGHT BREAST LUMPECTOMY WITH RADIOACTIVE SEED AND RIGHT AXILLARY SENTINEL LYMPH NODE BIOPSY;  Surgeon: Ebbie Cough, MD;  Location: [facility] OR;  Service: General;   Laterality: Right;   CATARACT EXTRACTION, BILATERAL     EYE SURGERY  08/2013  10/2013   cataracts   PACEMAKER IMPLANT N/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/2023   Procedure: PACEMAKER IMPLANT;  Surgeon: Inocencio Soyla Lunger, MD;  Location: [facility] INV[facility]IVE [facility];  Service: Cardiovascular;  Laterality: N/A;   TONSILLECTOMY     Family History  Problem Relation Age of Onset   Heart failure Mother        CHF   Hyperlipidemia Mother    Aortic stenosis Mother    Arthritis Mother    Heart disease Mother    Lung cancer Father 30   Cancer Father    Early death Father    Breast cancer Sister 5   Cancer Sister 13   Depression Sister    Stroke Maternal Aunt    Varicose Veins Maternal Aunt    Diabetes Maternal Grandmother    Hyperlipidemia Maternal Grandmother    Colon cancer Maternal Grandfather    Cancer Maternal Grandfather  colon cancer   Cancer Paternal Grandfather        unknown type cancer   Drug abuse Daughter    Heart attack Neg Hx    Hypertension Neg Hx    Sudden death Neg Hx    Esophageal cancer Neg Hx    Stomach cancer Neg Hx    Rectal cancer Neg Hx    Social History   Socioeconomic History   Marital status: Married    Spouse name: Not on file   Number of children: 1   Years of education: Not on file   Highest education level: Some college, no degree  Occupational History   Occupation: retired    Associate Professor: [employer]    Comment: retired  Tobacco Use   Smoking status: Former    Current packs/day: 0.00    Average packs/day: 0.5 packs/day for 40.0 years (20.0 ttl pk-yrs)    Types: Cigarettes, E-cigarettes    Start date: 07/23/1971    Quit date: 07/23/2011    Years since quitting: 13.1   Smokeless tobacco: Never   Tobacco comments:    The last 5 yrs. I smoked .5 pack a day. Before that, 1 pk. a day  Vaping Use   Vaping status: Former   Substances: Nicotine  Substance and Sexual Activity   Alcohol use: Not Currently    Comment: occasional wine   Drug use: No   Sexual  activity: Not Currently    Partners: Male    Birth control/protection: Post-menopausal  Other Topics Concern   Not on file  Social History Narrative   Exercise-virtual class, MWF   Social Drivers of Corporate Investment Banker Strain: Low Risk  (09/09/2024)   Overall Financial Resource Strain (CARDIA)    Difficulty of Paying Living Expenses: Not hard at all  Food Insecurity: No Food Insecurity (09/09/2024)   Hunger Vital Sign    Worried About Running Out of Food in the Last Year: Never true    Ran Out of Food in the Last Year: Never true  Transportation Needs: No Transportation Needs (09/09/2024)   PRAPARE - Administrator, Civil Service (Medical): No    Lack of Transportation (Non-Medical): No  Physical Activity: Insufficiently Active (09/17/2024)   Exercise Vital Sign    Days of Exercise per Week: 3 days    Minutes of Exercise per Session: 30 min  Stress: Stress Concern Present (09/09/2024)   [employer] of Occupational Health - Occupational Stress Questionnaire    Feeling of Stress: To some extent  Social Connections: Moderately Integrated (09/09/2024)   Social Connection and Isolation Panel    Frequency of Communication with Friends and Family: Three times a week    Frequency of Social Gatherings with Friends and Family: Never    Attends Religious Services: Never    Database Administrator or Organizations: Yes    Attends Engineer, Structural: 1 to 4 times per year    Marital Status: Married  Recent Concern: Social Connections - Moderately Isolated (07/12/2024)   Social Connection and Isolation Panel    Frequency of Communication with Friends and Family: More than three times a week    Frequency of Social Gatherings with Friends and Family: Never    Attends Religious Services: Never    Database Administrator or Organizations: No    Attends Engineer, Structural: Not on file    Marital Status: Married    Tobacco Counseling Counseling  given: Not  Answered Tobacco comments: The last 5 yrs. I smoked .5 pack a day. Before that, 1 pk. a day    Clinical Intake:  Pre-visit preparation completed: Yes  Pain : No/denies pain     BMI - recorded: 20.77 Nutritional Status: BMI of 19-24  Normal Nutritional Risks: None Diabetes: No  Lab Results  Component Value Date   HGBA1C 5.1 08/04/2017     How often do you need to have someone help you when you read instructions, pamphlets, or other written materials from your doctor or pharmacy?: 1 - Never  Interpreter Needed?: No  Information entered by :: Lolita Libra, CMA(AAMA)   Activities of Daily Living     09/10/2024    8:44 AM  In your present state of health, do you have any difficulty performing the following activities:  Hearing? 1  Vision? 0  Difficulty concentrating or making decisions? 0  Walking or climbing stairs? 0  Dressing or bathing? 0  Doing errands, shopping? 0  Preparing Food and eating ? N  Using the Toilet? N  In the past six months, have you accidently leaked urine? N  Comment frequent urination at night. doesn't want to discuss with PCP at this time  Do you have problems with loss of bowel control? N  Managing your Medications? N  Managing your Finances? N  Housekeeping or managing your Housekeeping? N    Patient Care Team: Antonio Meth, Jamee SAUNDERS, DO as PCP - General Inocencio Soyla Lunger, MD as PCP - Electrophysiology (Cardiology) [place] Elspeth ORN, MD as Referring Physician (Optometry) Gabriel Dames (Dermatology) Ebbie Cough, MD as Consulting Physician (General Surgery) Lanny Callander, MD as Consulting Physician (Hematology) Aryal, Govinda, MD as Consulting Physician (Rheumatology) Camillo Golas, MD as Attending Physician (Ophthalmology)  I have updated your Care Teams any recent Medical Services you may have received from other providers in the past year.     Assessment:   This is a routine wellness examination for  Samantha Miranda.  Hearing/Vision screen Hearing Screening - Comments:: Denies hearing difficulties.  Vision Screening - Comments:: Up to date with routine eye exams with [facility]   Goals Addressed             This Visit's Progress    DIET - INCREASE WATER INTAKE   On track      Depression Screen     09/17/2024    3:21 PM 09/14/2023    3:49 PM 08/02/2022    1:07 PM 08/02/2022    1:06 PM 11/23/2021    1:33 PM 05/25/2021   11:49 AM 01/18/2021   11:16 AM  PHQ 2/9 Scores  PHQ - 2 Score 3 0 0 1 0 0 0  PHQ- 9 Score 5          Fall Risk     09/10/2024    8:44 AM 09/11/2023    9:58 AM 08/02/2022    1:10 PM 07/26/2022   11:07 AM 11/23/2021    1:33 PM  Fall Risk   Falls in the past year? 0 0 0 0 0  Number falls in past yr: 0 0 0  0  Injury with Fall? 0 0 0  0  Risk for fall due to : No Fall Risks No Fall Risks Impaired vision  No Fall Risks  Follow up Education provided Falls evaluation completed Falls prevention discussed   Falls evaluation completed      Data saved with a previous flowsheet row definition    MEDICARE RISK AT  HOME:  Medicare Risk at Home Any stairs in or around the home?: (Patient-Rptd) Yes If so, are there any without handrails?: (Patient-Rptd) No Home free of loose throw rugs in walkways, pet beds, electrical cords, etc?: (Patient-Rptd) Yes Adequate lighting in your home to reduce risk of falls?: (Patient-Rptd) Yes Life alert?: (Patient-Rptd) No Use of a cane, walker or w/c?: (Patient-Rptd) No Grab bars in the bathroom?: Yes Shower chair or bench in shower?: (Patient-Rptd) Yes Elevated toilet seat or a handicapped toilet?: (Patient-Rptd) No  TIMED UP AND GO:  Was the test performed?  No,audio  Cognitive Function: 6CIT completed    12/19/2017   11:30 AM  MMSE - Mini Mental State Exam  Orientation to time 5   Orientation to Place 5   Registration 3   Attention/ Calculation 5   Recall 3   Language- name 2 objects 2   Language- repeat 1  Language- follow  3 step command 3   Language- read & follow direction 1   Write a sentence 1   Copy design 1   Total score 30      Data saved with a previous flowsheet row definition        09/17/2024    3:30 PM 09/14/2023    3:51 PM 08/02/2022    1:11 PM  6CIT Screen  What Year? 0 points 0 points 0 points  What month? 0 points 0 points 0 points  What time? 0 points 0 points 0 points  Count back from 20 0 points 0 points 0 points  Months in reverse 0 points 0 points 0 points  Repeat phrase 0 points 0 points 0 points  Total Score 0 points 0 points 0 points    Immunizations Immunization History  Administered Date(s) Administered   Fluad Quad(high Dose 65+) 10/29/2019, 07/22/2021, 08/02/2022   INFLUENZA, HIGH DOSE SEASONAL PF 10/10/2014, 11/02/2015, 08/26/2017, 09/17/2018, 09/10/2024   Influenza Split 09/22/2011   Influenza-Unspecified 08/26/2017, 09/17/2018, 09/16/2020   PFIZER(Purple Top)SARS-COV-2 Vaccination 01/25/2020, 02/15/2020, 09/16/2020   Pfizer Covid-19 Vaccine Bivalent Booster 41yrs & up 08/23/2021   Pneumococcal Conjugate-13 04/21/2015   Pneumococcal Polysaccharide-23 03/03/2014   Td 08/26/2010   Tdap 05/07/2021   Zoster Recombinant(Shingrix) 06/11/2017, 08/26/2017   Zoster, Live 04/02/2009    Screening Tests Health Maintenance  Topic Date Due   COVID-19 Vaccine (5 - 2025-26 season) 09/17/2025 (Originally 07/22/2024)   Mammogram  07/15/2025   Lung Cancer Screening  08/01/2025   Medicare Annual Wellness (AWV)  09/17/2025   DTaP/Tdap/Td (3 - Td or Tdap) 05/08/2031   Pneumococcal Vaccine: 50+ Years  Completed   Influenza Vaccine  Completed   DEXA SCAN  Completed   Hepatitis C Screening  Completed   Zoster Vaccines- Shingrix  Completed   Meningococcal B Vaccine  Aged Out   Colonoscopy  Discontinued    Health Maintenance Items Addressed: Pt declines Covid vaccine, all other HM up to date  Additional Screening:  Vision Screening: Recommended annual ophthalmology exams  for early detection of glaucoma and other disorders of the eye. Is the patient up to date with their annual eye exam?  Yes  Who is the provider or what is the name of the office in which the patient attends annual eye exams? [facility]  Dental Screening: Recommended annual dental exams for proper oral hygiene  Community Resource Referral / Chronic Care Management: CRR required this visit?  No   CCM required this visit?  No   Plan:    I have personally reviewed  and noted the following in the patient's chart:   Medical and social history Use of alcohol, tobacco or illicit drugs  Current medications and supplements including opioid prescriptions. Patient is not currently taking opioid prescriptions. Functional ability and status Nutritional status Physical activity Advanced directives List of other physicians Hospitalizations, surgeries, and ER visits in previous 12 months Vitals Screenings to include cognitive, depression, and falls Referrals and appointments  In addition, I have reviewed and discussed with patient certain preventive protocols, quality metrics, and best practice recommendations. A written personalized care plan for preventive services as well as general preventive health recommendations were provided to patient.   Lolita Libra, CMA   09/17/2024   After Visit Summary: (MyChart) Due to this being a telephonic visit, the after visit summary with patients personalized plan was offered to patient via MyChart   Notes: Nothing significant to report at this time.

## 2024-09-17 NOTE — Patient Instructions (Addendum)
 Samantha Miranda , Thank you for taking time out of your busy schedule to complete your Annual Wellness Visit with me. I enjoyed our conversation and look forward to speaking with you again next year. I, as well as your care team,  appreciate your ongoing commitment to your health goals. Please review the following plan we discussed and let me know if I can assist you in the future. Your Game plan/ To Do List    Goal:                This Visit's Progress    DIET - INCREASE WATER INTAKE   On track   Follow up Visits: Next Medicare AWV with our clinical staff:  09/19/25 3pm, telephone  Next Office Visit with your provider: 01/14/25 11am, Dr Antonio Meth  Clinician Recommendations:  Aim for 30 minutes of exercise or brisk walking, 6-8 glasses of water, and 5 servings of fruits and vegetables each day.       This is a list of the screening recommended for you and due dates:  Health Maintenance  Topic Date Due   Medicare Annual Wellness Visit  09/13/2024   COVID-19 Vaccine (5 - 2025-26 season) 09/17/2025*   Breast Cancer Screening  07/15/2025   Screening for Lung Cancer  08/01/2025   DTaP/Tdap/Td vaccine (3 - Td or Tdap) 05/08/2031   Pneumococcal Vaccine for age over 78  Completed   Flu Shot  Completed   DEXA scan (bone density measurement)  Completed   Hepatitis C Screening  Completed   Zoster (Shingles) Vaccine  Completed   Meningitis B Vaccine  Aged Out   Colon Cancer Screening  Discontinued  *Topic was postponed. The date shown is not the original due date.    Advanced directives: (Copy Requested) Please bring a copy of your health care power of attorney and living will to the office to be added to your chart at your convenience. You can mail to [facility] [address] [place], [place] [address] or email to [email or web address] Advance Care Planning is important because it:  [x]  Makes sure you receive the medical care that is consistent with your values,  goals, and preferences  [x]  It provides guidance to your family and loved ones and reduces their decisional burden about whether or not they are making the right decisions based on your wishes.  Follow the link provided in your after visit summary or read over the paperwork we have mailed to you to help you started getting your Advance Directives in place. If you need assistance in completing these, please reach out to us  so that we can help you!  See attachments for Preventive Care and Fall Prevention Tips.

## 2024-09-17 NOTE — Telephone Encounter (Signed)
 Pt had AWV. States she only sleeps about 4 hrs every night despite taking zolpidem .  She wants to know if there might be another option that would work better?  If no other option, she would like a refill sent to [facility] on [address] and [address]

## 2024-09-18 ENCOUNTER — Other Ambulatory Visit: Payer: Self-pay | Admitting: Family Medicine

## 2024-09-18 DIAGNOSIS — G4700 Insomnia, unspecified: Secondary | ICD-10-CM

## 2024-09-18 MED ORDER — DOXEPIN HCL 6 MG PO TABS: 1.0000 | ORAL_TABLET | Freq: Every evening | ORAL | 1 refills | Status: AC | PRN

## 2024-09-18 NOTE — Telephone Encounter (Signed)
 Notified pt.

## 2024-09-26 ENCOUNTER — Other Ambulatory Visit: Payer: Self-pay | Admitting: Family Medicine

## 2024-09-26 DIAGNOSIS — E785 Hyperlipidemia, unspecified: Secondary | ICD-10-CM

## 2024-09-27 ENCOUNTER — Other Ambulatory Visit: Payer: Self-pay | Admitting: Family Medicine

## 2024-09-27 DIAGNOSIS — I10 Essential (primary) hypertension: Secondary | ICD-10-CM

## 2024-10-03 ENCOUNTER — Other Ambulatory Visit: Payer: Self-pay | Admitting: Family Medicine

## 2024-10-03 DIAGNOSIS — K219 Gastro-esophageal reflux disease without esophagitis: Secondary | ICD-10-CM

## 2024-10-10 DIAGNOSIS — H04129 Dry eye syndrome of unspecified lacrimal gland: Secondary | ICD-10-CM | POA: Diagnosis not present

## 2024-10-10 DIAGNOSIS — M79643 Pain in unspecified hand: Secondary | ICD-10-CM | POA: Diagnosis not present

## 2024-10-10 DIAGNOSIS — M0579 Rheumatoid arthritis with rheumatoid factor of multiple sites without organ or systems involvement: Secondary | ICD-10-CM | POA: Diagnosis not present

## 2024-10-10 DIAGNOSIS — M7989 Other specified soft tissue disorders: Secondary | ICD-10-CM | POA: Diagnosis not present

## 2024-10-10 DIAGNOSIS — M1990 Unspecified osteoarthritis, unspecified site: Secondary | ICD-10-CM | POA: Diagnosis not present

## 2024-10-10 DIAGNOSIS — Z79899 Other long term (current) drug therapy: Secondary | ICD-10-CM | POA: Diagnosis not present

## 2024-10-21 ENCOUNTER — Emergency Department (HOSPITAL_BASED_OUTPATIENT_CLINIC_OR_DEPARTMENT_OTHER)
Admission: EM | Admit: 2024-10-21 | Discharge: 2024-10-21 | Disposition: A | Discharge status: Home / Self Care | Source: Ambulatory Visit | Attending: Emergency Medicine | Admitting: Emergency Medicine

## 2024-10-21 ENCOUNTER — Emergency Department (HOSPITAL_BASED_OUTPATIENT_CLINIC_OR_DEPARTMENT_OTHER)

## 2024-10-21 ENCOUNTER — Other Ambulatory Visit (HOSPITAL_BASED_OUTPATIENT_CLINIC_OR_DEPARTMENT_OTHER): Payer: Self-pay

## 2024-10-21 ENCOUNTER — Encounter (HOSPITAL_BASED_OUTPATIENT_CLINIC_OR_DEPARTMENT_OTHER): Payer: Self-pay

## 2024-10-21 ENCOUNTER — Other Ambulatory Visit: Payer: Self-pay

## 2024-10-21 DIAGNOSIS — R1011 Right upper quadrant pain: Secondary | ICD-10-CM

## 2024-10-21 DIAGNOSIS — R0602 Shortness of breath: Secondary | ICD-10-CM | POA: Insufficient documentation

## 2024-10-21 DIAGNOSIS — R791 Abnormal coagulation profile: Secondary | ICD-10-CM | POA: Diagnosis not present

## 2024-10-21 DIAGNOSIS — Z950 Presence of cardiac pacemaker: Secondary | ICD-10-CM | POA: Insufficient documentation

## 2024-10-21 DIAGNOSIS — J449 Chronic obstructive pulmonary disease, unspecified: Secondary | ICD-10-CM | POA: Insufficient documentation

## 2024-10-21 DIAGNOSIS — F419 Anxiety disorder, unspecified: Secondary | ICD-10-CM | POA: Insufficient documentation

## 2024-10-21 DIAGNOSIS — R001 Bradycardia, unspecified: Secondary | ICD-10-CM | POA: Insufficient documentation

## 2024-10-21 DIAGNOSIS — R0789 Other chest pain: Secondary | ICD-10-CM | POA: Diagnosis not present

## 2024-10-21 DIAGNOSIS — R1013 Epigastric pain: Secondary | ICD-10-CM

## 2024-10-21 DIAGNOSIS — Z7951 Long term (current) use of inhaled steroids: Secondary | ICD-10-CM | POA: Insufficient documentation

## 2024-10-21 DIAGNOSIS — Z79899 Other long term (current) drug therapy: Secondary | ICD-10-CM | POA: Insufficient documentation

## 2024-10-21 DIAGNOSIS — I700 Atherosclerosis of aorta: Secondary | ICD-10-CM | POA: Diagnosis not present

## 2024-10-21 DIAGNOSIS — Z87891 Personal history of nicotine dependence: Secondary | ICD-10-CM | POA: Diagnosis not present

## 2024-10-21 DIAGNOSIS — R072 Precordial pain: Secondary | ICD-10-CM | POA: Insufficient documentation

## 2024-10-21 DIAGNOSIS — R079 Chest pain, unspecified: Secondary | ICD-10-CM | POA: Diagnosis not present

## 2024-10-21 DIAGNOSIS — R112 Nausea with vomiting, unspecified: Secondary | ICD-10-CM | POA: Diagnosis not present

## 2024-10-21 DIAGNOSIS — I10 Essential (primary) hypertension: Secondary | ICD-10-CM | POA: Diagnosis not present

## 2024-10-21 LAB — CBC
HCT: 42.3 % (ref 36.0–46.0)
Hemoglobin: 14.1 g/dL (ref 12.0–15.0)
MCH: 30.8 pg (ref 26.0–34.0)
MCHC: 33.3 g/dL (ref 30.0–36.0)
MCV: 92.4 fL (ref 80.0–100.0)
Platelets: 237 K/uL (ref 150–400)
RBC: 4.58 MIL/uL (ref 3.87–5.11)
RDW: 12.9 % (ref 11.5–15.5)
WBC: 6.3 K/uL (ref 4.0–10.5)
nRBC: 0 % (ref 0.0–0.2)

## 2024-10-21 LAB — BASIC METABOLIC PANEL WITH GFR
Anion gap: 11 (ref 5–15)
BUN: 18 mg/dL (ref 8–23)
CO2: 25 mmol/L (ref 22–32)
Calcium: 9.3 mg/dL (ref 8.9–10.3)
Chloride: 103 mmol/L (ref 98–111)
Creatinine, Ser: 0.89 mg/dL (ref 0.44–1.00)
GFR, Estimated: >60 mL/min (ref >60)
Glucose, Bld: 108 mg/dL — ABNORMAL HIGH (ref 70–99)
Potassium: 4 mmol/L (ref 3.5–5.1)
Sodium: 140 mmol/L (ref 135–145)

## 2024-10-21 LAB — HEPATIC FUNCTION PANEL
ALT: 14 U/L (ref 0–44)
AST: 22 U/L (ref 15–41)
Albumin: 4.2 g/dL (ref 3.5–5.0)
Alkaline Phosphatase: 53 U/L (ref 38–126)
Bilirubin, Direct: 0.2 mg/dL (ref 0.0–0.2)
Indirect Bilirubin: 0.3 mg/dL (ref 0.3–0.9)
Total Bilirubin: 0.5 mg/dL (ref 0.0–1.2)
Total Protein: 7.2 g/dL (ref 6.5–8.1)

## 2024-10-21 LAB — TROPONIN T, HIGH SENSITIVITY: Troponin T High Sensitivity: <15 ng/L (ref 0–19)

## 2024-10-21 LAB — D-DIMER, QUANTITATIVE: D-Dimer, Quant: 1.01 ug{FEU}/mL — ABNORMAL HIGH (ref 0.00–0.50)

## 2024-10-21 LAB — LIPASE, BLOOD: Lipase: 39 U/L (ref 11–51)

## 2024-10-21 MED ORDER — IOHEXOL 350 MG/ML SOLN
100.0000 mL | Freq: Once | INTRAVENOUS | Status: AC | PRN
  Administered 2024-10-21: 100 mL via INTRAVENOUS

## 2024-10-21 MED ORDER — PROMETHAZINE HCL 25 MG PO TABS
25.0000 mg | ORAL_TABLET | Freq: Three times a day (TID) | ORAL | 0 refills | Status: AC | PRN
  Filled 2024-10-21: qty 6, 2d supply, fill #0

## 2024-10-21 NOTE — Discharge Instructions (Signed)
 We ruled out any scary things that could be going on with your heart. We did not see any blood clots in your lungs and the imaging of your abdomen did not show anything as well. Because you have a history of acid reflux, continue to take your protonix  but I think it would be beneficial for you to see the stomach doctors. It looks like your primary care doctor has already sent a referral for this. I would contact them about this and also just to follow up about this visit to the ER. I will also send you home with

## 2024-10-21 NOTE — Telephone Encounter (Signed)
 Referral placed.

## 2024-10-21 NOTE — ED Provider Notes (Signed)
 [place] [facility] Provider Note   CSN: [id number] Arrival date & time: 10/21/24  0830     Patient presents with: Chest Pain   Samantha Miranda is a 78 y.o. female with PMH of COPD, Barrett's esophagus, GERD, hyperlipidemia, former tobacco smoker, insomnia, hypertension, status post pacemaker that presents today for 3 weeks of chest pain.  Patient states that she presented today because she had a rough' night with the pain.  Patient states that the pain is mostly constant and she describes it as heavy and sharp occasionally.  Patient states that the worst pain is under her right breast and radiates to her back.  Mostly notices that it is exacerbated after eating but does not notice any differentiation between meals.  She states that she has had some nausea that she has been taking Zofran  for and has episodes of vomiting that she states more like acid reflux.  She states that she has been taking her medications for acid reflux as well.  Appears from 10/23 that patient had presented with similar complaints and D-dimer was mildly elevated but otherwise workup was unremarkable.  Patient states that chest pain is reproducible to palpation.  She states that she has taken Tylenol  arthritis.  She has a history of RA as well.  She states that this has not helped.  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  As far as her shortness of breath she states that it does get worse with the pain.  She denies any leg swelling or calf pain.   Chest Pain      Prior to Admission medications   Medication Sig Start Date End Date Taking? Authorizing Provider  acetaminophen  (TYLENOL ) 325 MG tablet Take 2 tablets (650 mg total) by mouth every 6 (six) hours as needed for moderate pain (pain score 4-6). 10/18/23   Christopher Savannah, PA-C  albuterol  (VENTOLIN  HFA) 108 [phone] Base) MCG/ACT  inhaler Inhale 2 puffs into the lungs every 6 (six) hours as needed for wheezing or shortness of breath. 07/12/24   Antonio Meth, Jamee SAUNDERS, DO  azelastine  (ASTELIN ) 0.1 % nasal spray Place 1 spray into both nostrils 2 (two) times daily. Use in each nostril as directed. 08/01/24   Antonio Meth Jamee SAUNDERS, DO  Calcium -Magnesium-Zinc (CAL-MAG-ZINC PO) Take 2 tablets by mouth daily.    [provider]  cholecalciferol (VITAMIN D3) 25 MCG (1000 UNIT) tablet Take 1,000 Units by mouth daily.    [provider]  Doxepin  HCl 6 MG TABS Take 1 tablet (6 mg total) by mouth at bedtime as needed. 09/18/24   Antonio Meth Jamee SAUNDERS, DO  ezetimibe  (ZETIA ) 10 MG tablet Take 1 tablet (10 mg total) by mouth daily. 09/27/24   Antonio Meth Jamee SAUNDERS, DO  fluticasone  (FLONASE ) 50 MCG/ACT nasal spray Place 2 sprays into both nostrils daily. Patient taking differently: Place 2 sprays into both nostrils daily as needed for allergies. 04/29/19   Lowne Chase, Yvonne R, DO  fluticasone -salmeterol (ADVAIR) 100-50 MCG/ACT AEPB Inhale 1 puff into the lungs 2 (two) times daily. 10/03/24   Lowne Chase, Yvonne R, DO  folic acid (FOLVITE) 1 MG tablet Take 1 mg by mouth daily. 09/07/22   [provider]  gabapentin  (NEURONTIN ) 100 MG capsule TAKE ONE CAPSULE BY MOUTH THREE TIMES A DAY 09/09/24   Burton, Lacie K, NP  hydroxychloroquine  (PLAQUENIL ) 200 MG tablet Take  1 tablet (200 mg total) by mouth daily. 05/15/23   Antonio Cyndee Jamee JONELLE, DO  loratadine  (CLARITIN ) 10 MG tablet Take 1 tablet (10 mg total) by mouth daily. Patient taking differently: Take 10 mg by mouth daily as needed for allergies or rhinitis. 12/20/21   Lowne Chase, Yvonne R, DO  losartan  (COZAAR ) 100 MG tablet Take 1 tablet (100 mg total) by mouth daily. 09/27/24   Lowne Chase, Yvonne R, DO  OLIVE LEAF EXTRACT PO Take 1 drop by mouth daily.    [provider]  Omega-3 Fatty Acids (FISH OIL) 1200 MG CAPS Take 1,200 mg by mouth daily.    [provider]  ondansetron  (ZOFRAN ) 4 MG tablet TAKE ONE TABLET BY MOUTH EVERY 8 HOURS AS NEEDED FOR NAUSEA OR VOMITING 04/11/23   Antonio Cyndee, Jamee JONELLE, DO  pantoprazole  (PROTONIX ) 40 MG tablet Take 1 tablet (40 mg total) by mouth daily before breakfast. 10/03/24   Antonio Cyndee, Yvonne R, DO  Polyethyl Glycol-Propyl Glycol (SYSTANE ULTRA) 0.4-0.3 % SOLN Apply 1 drop to eye as needed (dry eyes).    [provider]  promethazine -dextromethorphan (PROMETHAZINE -DM) 6.25-15 MG/5ML syrup Take 5 mLs by mouth 4 (four) times daily as needed. 08/01/24   Lowne Chase, Yvonne R, DO  vitamin C (ASCORBIC ACID) 250 MG tablet Take 250 mg by mouth daily.    [provider]  XIIDRA  5 % SOLN Place 1 drop into both eyes in the morning and at bedtime. 05/08/17   [provider]    Allergies: Statins, Augmentin  [amoxicillin -pot clavulanate], and Moxifloxacin    Review of Systems  Cardiovascular:  Positive for chest pain.  Otherwise as noted in HPI  Updated Vital Signs BP (!) 182/79   Pulse (!) 109   Temp 97.7 F (36.5 C) (Oral)   Resp 20   SpO2 96%   Physical Exam Constitutional:      Comments: Not in acute distress but uncomfortable  Cardiovascular:     Rate and Rhythm: Normal rate and regular rhythm.  Pulmonary:     Effort: Pulmonary effort is normal. No respiratory distress.     Comments: Breath sounds are normal Chest:     Chest wall: Tenderness present.  Abdominal:     Palpations: There is no fluid wave or hepatomegaly.     Tenderness: There is abdominal tenderness. There is no guarding or rebound.     Comments: Tenderness to right upper quadrant but negative Murphy sign.  Musculoskeletal:     Right lower leg: No edema.     Left lower leg: No edema.     Comments: Stockings on bilaterally but no pitting edema noted.  Good pulses bilaterally DP.  No calf tenderness elicited bilaterally  Psychiatric:        Mood and Affect: Mood is anxious.     (all labs ordered are  listed, but only abnormal results are displayed) Labs Reviewed  BASIC METABOLIC PANEL WITH GFR - Abnormal; Notable for the following components:      Result Value   Glucose, Bld 108 (*)    All other components within normal limits  CBC  HEPATIC FUNCTION PANEL  LIPASE, BLOOD  D-DIMER, QUANTITATIVE (NOT AT [facility])  TROPONIN T, HIGH SENSITIVITY    EKG: EKG Interpretation Date/Time:  Monday October 21 2024 08:41:52 EST Ventricular Rate:  87 PR Interval:  196 QRS Duration:  108 QT Interval:  389 QTC Calculation: 468 R Axis:   -50  Text Interpretation: Sinus rhythm Abnormal  R-wave progression, early transition LVH with secondary repolarization abnormality Probable inferior infarct, recent Anterior Q waves, possibly due to LVH Confirmed by Darra Chew [phone]) on 10/21/2024 8:43:49 AM  Radiology: [facility] Chest 2 View Result Date: 10/21/2024 CLINICAL DATA:  Chest pain. EXAM: CHEST - 2 VIEW COMPARISON:  07/12/2024 FINDINGS: Stable appearance of the left chest dual chamber cardiac pacemaker. Heart size is normal. Atherosclerotic calcifications at the aortic arch. Lungs are clear without airspace disease or pulmonary edema. No large pleural effusion. No acute bone abnormality. IMPRESSION: No active cardiopulmonary disease. Electronically Signed   By: Juliene Balder M.D.   On: 10/21/2024 09:20     Procedures   Medications Ordered in the ED - No data to display                                  Medical Decision Making Amount and/or Complexity of Data Reviewed Labs: ordered. Radiology: ordered.  Risk Prescription drug management.   Differential includes cholecystitis, cholelithiasis, biliary colic peptic ulcers disease, pulmonary embolism, musculoskeletal pain, ACS, costochondritis, aortic dissection, malignancy  Initial impression is that patient does not appear to be in any acute distress but does appear to be uncomfortable.  Although patient states that this has been going on for about 3  weeks it seems like she has had office visit back and the end of October for the same issue.  Age-adjusted D-dimer showed that VTE was unlikely.  Other lab work was unremarkable.  Patient also had right upper quadrant ultrasound about a month ago that was unremarkable.  Like to rule out any cardiac or intra-abdominal etiologies.  Will order lipase hepatic function panel, troponin, EKG.  Will reorder D-dimer and depending on these results could get CTA to rule out PE.  Since right upper quadrant ultrasound was unremarkable 1 month ago would likely not reorder this but can consider CT abdomen pelvis if other workup is unremarkable.   Final: BMP and CBC are unremarkable.  Troponin is not less than 15.  Hepatic function panel is unremarkable as well as lipase. Patient states that pain is unchanged. As d dimer was more elevated at 1.01 from .5, although age adjusted d dimer was not elevated. Did get CTA chest and abdomen pelvis that did not show anything acute to explain pain. Do think that with hx of barretts esophagus and pain being RUQ could benefit from seeing GI. Patient's PCP has sent referral so did tell patient to follow up with them about this. Recommend continuing protonix  and will give 6 tablets of promethazine  to help with nausea but needs to follow up with PCP for further management.      Final diagnoses:  None    ED Discharge Orders     None          D'Mello, Emanuelle Hammerstrom, DO 10/21/24 1200    Long, Chew MATSU, MD 10/22/24 [phone]

## 2024-10-21 NOTE — ED Triage Notes (Signed)
 Reports R sided chest pain under breast for 2 weeks. States pain has worsened today. Pain worse w deep breaths.

## 2024-12-04 ENCOUNTER — Ambulatory Visit: Payer: Medicare PPO

## 2024-12-04 DIAGNOSIS — I442 Atrioventricular block, complete: Secondary | ICD-10-CM | POA: Diagnosis not present

## 2024-12-05 LAB — CUP PACEART REMOTE DEVICE CHECK
Battery Remaining Longevity: 145 mo
Battery Voltage: 3.09 V
Brady Statistic AP VP Percent: 0.19 %
Brady Statistic AP VS Percent: 0 %
Brady Statistic AS VP Percent: 99.8 %
Brady Statistic AS VS Percent: 0.01 %
Brady Statistic RA Percent Paced: 0.19 %
Brady Statistic RV Percent Paced: 99.99 %
Date Time Interrogation Session: 20260114011814
Implantable Lead Connection Status: 753985
Implantable Lead Connection Status: 753985
Implantable Lead Implant Date: 20250114
Implantable Lead Implant Date: 20250114
Implantable Lead Location: 753859
Implantable Lead Location: 753860
Implantable Lead Model: 3830
Implantable Lead Model: 5076
Implantable Pulse Generator Implant Date: 20250114
Lead Channel Impedance Value: 361 Ohm
Lead Channel Impedance Value: 418 Ohm
Lead Channel Impedance Value: 551 Ohm
Lead Channel Impedance Value: 589 Ohm
Lead Channel Pacing Threshold Amplitude: 0.5 V
Lead Channel Pacing Threshold Amplitude: 0.625 V
Lead Channel Pacing Threshold Pulse Width: 0.4 ms
Lead Channel Pacing Threshold Pulse Width: 0.4 ms
Lead Channel Sensing Intrinsic Amplitude: 2.375 mV
Lead Channel Sensing Intrinsic Amplitude: 2.375 mV
Lead Channel Sensing Intrinsic Amplitude: 29.75 mV
Lead Channel Sensing Intrinsic Amplitude: 31.625 mV
Lead Channel Setting Pacing Amplitude: 1.5 V
Lead Channel Setting Pacing Amplitude: 2 V
Lead Channel Setting Pacing Pulse Width: 0.4 ms
Lead Channel Setting Sensing Sensitivity: 2.8 mV
Zone Setting Status: 755011
Zone Setting Status: 755011

## 2024-12-06 ENCOUNTER — Ambulatory Visit: Payer: Self-pay | Admitting: Cardiology

## 2024-12-09 NOTE — Progress Notes (Signed)
 Remote PPM Transmission

## 2024-12-23 ENCOUNTER — Ambulatory Visit: Admitting: Dermatology

## 2024-12-25 ENCOUNTER — Encounter: Payer: Self-pay | Admitting: Gastroenterology

## 2025-01-14 ENCOUNTER — Ambulatory Visit: Admitting: Family Medicine

## 2025-01-27 ENCOUNTER — Ambulatory Visit: Admitting: Physician Assistant

## 2025-02-04 ENCOUNTER — Ambulatory Visit: Admitting: Gastroenterology

## 2025-04-03 ENCOUNTER — Ambulatory Visit: Admitting: Adult Health

## 2025-04-03 ENCOUNTER — Inpatient Hospital Stay

## 2025-04-03 ENCOUNTER — Other Ambulatory Visit

## 2025-09-19 ENCOUNTER — Ambulatory Visit
